# Patient Record
Sex: Female | Born: 1935
Health system: Southern US, Community
[De-identification: ages and names within clinical notes are randomized; demographics above are authoritative.]

## PROBLEM LIST (undated history)

## (undated) DIAGNOSIS — K219 Gastro-esophageal reflux disease without esophagitis: Secondary | ICD-10-CM

## (undated) DIAGNOSIS — E785 Hyperlipidemia, unspecified: Secondary | ICD-10-CM

## (undated) DIAGNOSIS — E119 Type 2 diabetes mellitus without complications: Secondary | ICD-10-CM

## (undated) DIAGNOSIS — I509 Heart failure, unspecified: Secondary | ICD-10-CM

## (undated) DIAGNOSIS — I10 Essential (primary) hypertension: Secondary | ICD-10-CM

## (undated) HISTORY — PX: BREAST BIOPSY: SHX20

## (undated) HISTORY — DX: Gastro-esophageal reflux disease without esophagitis: K21.9

## (undated) HISTORY — PX: OTHER SURGICAL HISTORY: SHX169

## (undated) HISTORY — PX: CHOLECYSTECTOMY OPEN: SUR202

## (undated) HISTORY — DX: Type 2 diabetes mellitus without complications: E11.9

## (undated) HISTORY — PX: VAGINAL HYSTERECTOMY: SUR661

## (undated) HISTORY — DX: Heart failure, unspecified: I50.9

## (undated) HISTORY — DX: Essential (primary) hypertension: I10

## (undated) HISTORY — DX: Hyperlipidemia, unspecified: E78.5

---

## 2005-05-21 ENCOUNTER — Ambulatory Visit: Payer: Self-pay | Admitting: Internal Medicine

## 2006-06-17 ENCOUNTER — Ambulatory Visit: Payer: Self-pay | Admitting: Internal Medicine

## 2006-12-14 ENCOUNTER — Ambulatory Visit: Payer: Self-pay | Admitting: Gastroenterology

## 2007-06-21 ENCOUNTER — Ambulatory Visit: Payer: Self-pay | Admitting: Internal Medicine

## 2007-06-24 ENCOUNTER — Ambulatory Visit: Payer: Self-pay | Admitting: Internal Medicine

## 2008-01-26 ENCOUNTER — Ambulatory Visit: Payer: Self-pay | Admitting: Gynecology

## 2008-03-20 ENCOUNTER — Ambulatory Visit: Payer: Self-pay | Admitting: Gynecology

## 2008-03-20 ENCOUNTER — Ambulatory Visit (HOSPITAL_COMMUNITY): Admission: RE | Admit: 2008-03-20 | Discharge: 2008-03-20 | Payer: Self-pay | Admitting: Gynecology

## 2008-03-22 ENCOUNTER — Ambulatory Visit: Payer: Self-pay | Admitting: Gynecology

## 2008-04-26 ENCOUNTER — Ambulatory Visit: Payer: Self-pay | Admitting: Obstetrics & Gynecology

## 2008-06-22 ENCOUNTER — Ambulatory Visit: Payer: Self-pay | Admitting: Internal Medicine

## 2008-07-11 ENCOUNTER — Ambulatory Visit: Payer: Self-pay | Admitting: Internal Medicine

## 2008-10-15 ENCOUNTER — Ambulatory Visit: Payer: Self-pay | Admitting: Family Medicine

## 2008-10-15 LAB — HM DEXA SCAN

## 2009-02-08 ENCOUNTER — Ambulatory Visit: Payer: Self-pay | Admitting: Urology

## 2009-06-26 ENCOUNTER — Ambulatory Visit: Payer: Self-pay | Admitting: Internal Medicine

## 2010-05-13 ENCOUNTER — Ambulatory Visit: Payer: Self-pay | Admitting: Urology

## 2010-05-28 ENCOUNTER — Ambulatory Visit: Payer: Self-pay | Admitting: Urology

## 2010-06-27 ENCOUNTER — Ambulatory Visit: Payer: Self-pay | Admitting: Internal Medicine

## 2011-01-27 NOTE — Op Note (Signed)
NAME:  Jordan Thornton, Jordan Thornton            ACCOUNT NO.:  [id number]   MEDICAL RECORD NO.:  [id number]          PATIENT TYPE:  AMB   LOCATION:  [place]                           FACILITY:  [facility]   PHYSICIAN:  Willey Blade, MD  DATE OF BIRTH:  10/25/1929   DATE OF PROCEDURE:  03/20/2008  DATE OF DISCHARGE:                               OPERATIVE REPORT   PREOPERATIVE DIAGNOSES:  1. Genuine urinary stress incontinence.  2. Second-degree cystocele.   POSTOPERATIVE DIAGNOSES:  1. Genuine urinary stress incontinence.  2. Second-degree cystocele.   OPERATIVE PROCEDURE:  Tension-free vaginal tape   PROCEDURES:  Cystoscopy and anterior colporrhaphy.   SURGEON:  Willey Blade, MD   ASSISTANT:  None.   COMPLICATIONS:  None immediate.   ESTIMATED BLOOD LOSS:  Less than 100 mL.   SPECIMEN:  None.   FINDINGS:  External genitalia, vulva, and vagina revealed a previous  excised cervix and uterus with a second-degree cystocele and a first-  degree rectocele.   OPERATIVE PROCEDURE:  The patient was prepped and draped in the usual  fashion and placed in a lithotomy position.  Betadine solution was used  for antiseptic and the patient was catheterized prior to the procedure.  After adequate general anesthesia, the anterior vaginal epithelium was  injected with Marcaine and epinephrine.  A vertical incision was made in  the midline for approximately 3-4 cm on the anterior vaginal epithelium.  The pubovesical cervical fascia was then appropriately dissected to the  space of Retzius using a bottom-up technique with a Delphi.  Said trocars were placed, hugging the posterior aspect of the  pubic bone and exiting the skin 1-2 cm lateral to the symphysis pubis.  Following this, immediate cystoscopy was performed.  No injury to the  urethra, trigone, lateral walls, or dome and observed, both ureteral  orifices were identified.  Afterwards, the anterior colporrhaphy was  performed by  using 0 Vicryl to approximate the pubovesical cervical  fascia in the midline.  Care was taken not to produce over-tightening in  this aspect of the procedure.  Afterwards, appropriate tensioning of the  TVT tape was performed and the plastic sheaths were then removed.  The  tape was cut below the skin.  Dermabond  was used for the skin for closure.  Copious irrigation with lactated  Ringer's followed.  Closure of the vaginal epithelium with 0 Monocryl  running interlocking suture.  The patient tolerated the procedure well  and returned to postanesthesia recovery room in excellent condition.      Willey Blade, MD  Electronically Signed     SHB/MEDQ  D:  03/20/2008  T:  03/21/2008  Job:  [id number]

## 2011-01-27 NOTE — Assessment & Plan Note (Signed)
NAME:  Jordan Thornton, Jordan Thornton            ACCOUNT NO.:  [id number]   MEDICAL RECORD NO.:  [id number]          PATIENT TYPE:  POB   LOCATION:  [place] [facility] [place]:  Willey Blade, MD DATE OF BIRTH:  [birth year]   DATE OF SERVICE:  01/26/2008                                  CLINIC NOTE   [name] is a 77 year old African American female gravida 2, para 2-  0-0-2 status post total vaginal hysterectomy and bilateral salpingo-  oophorectomy who has complaints related to genuine urinary stress  incontinence.  The patient loses urine with coughing, straining and  other Valsalva maneuver.  She denies loss of urine at rest.  Denies  postvoid dribbling, fecal incontinence, nocturia or increased frequency.  The patient takes no medications to enhance her loss of urine.  She has  had no previous urological surgery.   OB/GYN HISTORY:  1. The patient has had two normal vaginal deliveries in the past.  2. Total vaginal hysterectomy.  3. Bilateral salpingo-oophorectomy.   MEDICAL HISTORY:  1. The patient has type 2 diabetes mellitus.  2. Hypertension.  3. The patient states her the 2-hour postprandial blood sugars run      between 90-110.  4. She also has a hypercholesterolemia.   PAST SURGICAL HISTORY:  1. A total vaginal hysterectomy.  2. Bilateral salpingo-oophorectomy.  3. Cholecystectomy.  4. Colonoscopy in 2005.   MEDICATIONS:  1. Lisinopril one daily.  The patient does not know dosing, but will      bring it with her to the preop unit.  2. Glipizide dose unknown one in the morning.  3. Metformin 500 mg twice a day.  4. Niaspan one daily.  5. Simvastatin dose unknown one daily.   ALLERGIES:  NONE TO LATEX, IODINE OR FOODS OR MEDICATIONS.   REVIEW OF SYSTEMS:  The 14-point comprehensive review of systems within  normal limits.   SOCIAL HISTORY:  The patient denies alcohol or illicit drug abuse or  tobacco use.   FAMILY HISTORY:  No  first-degree relatives with breast, colon, ovarian  or uterine carcinoma.   PHYSICAL EXAMINATION:  Blood pressure 172/79, weight 172 pounds, height  5 feet,  5-1/2 inches, pulse 66 and regular.  HEENT:  Grossly normal.  BREAST EXAM:  Without masses, discharge, thickenings or tenderness.  CHEST:  Clear to percussion and auscultation.  CARDIOVASCULAR EXAM:  Without murmurs or enlargements, regular rate and  rhythm.  EXTREMITIES, LYMPHATIC, SKIN, NEUROLOGICAL, MUSCULOSKELETAL SYSTEMS:  Within normal limits.  PELVIC EXAM:  External genitalia:  Vulva and vagina reveals a second-  degree cystocele, first-degree rectocele with good vault support.  No  perianal lesions noted.  Cervix absent.  Both adnexa palpable and found  to be normal.  Cuff feels normal with no intrapelvic lesions noted.   IMPRESSION:  Second-degree cystocele and first-degree rectocele with  genuine urinary stress incontinence.   Urinalysis is negative.  Positive cough test and residual urine volume  26 mL.   PLAN:  The patient does not have to splint to have a bowel movement and  has an asymptomatic rectocele.  She is symptomatic for genuine urinary  stress incontinence without history of interstitial  cystitis or an  overactive bladder.  The patient would be an excellent candidate for an  anterior colporrhaphy and a tension-free vaginal tape procedure with  cystoscopy.  She has tried Kegel's on her on without benefit and  declines the use of a pessary.  She is an active woman and wishes to  have definitive management.  The nature of said procedure discussed in  detail including possible postoperative urinary retention, recurrent  incontinence, urgency, graft rejection, infection or erosion,  postoperative hematoma, bleeding and anesthesia pulmonary complications  as well.  The nature of said procedure discussed in detail.  The patient  verbalizes understanding of same and will be scheduled in the near  future for  tension-free vaginal tape procedure, cystoscopy and an  anterior colporrhaphy.           ______________________________  Willey Blade, MD     SHB/MEDQ  D:  01/26/2008  T:  01/26/2008  Job:  [id number]

## 2011-01-27 NOTE — Assessment & Plan Note (Signed)
NAME:  Jordan Thornton, Jordan Thornton            ACCOUNT NO.:  [id number]   MEDICAL RECORD NO.:  [id number]          PATIENT TYPE:  POB   LOCATION:  [place] [facility] [place]:  Silas Sacramento, MD      DATE OF BIRTH:  [birth year]   DATE OF SERVICE:  04/26/2008                                  CLINIC NOTE   The patient is a 77 year old female who presents for continued urinary  incontinence after a transvaginal tape and an anterior colporrhaphy done  on March 20, 2008.  The patient seems to have had an uncomplicated  procedure but, unfortunately, she is still complaining of incontinence  with coughing, laughing, and then now with just walking.  The patient  does not have any incontinence with rest.   PHYSICAL EXAMINATION:  VITAL SIGNS:  Pulse 67, blood pressure 132/83,  weight 172, and height 5 feet 5-1/2 inches.  GENERAL:  Well nourished, well developed, in no apparent distress.  GENITALIA:  Tanner V.  Vagina atrophic; vaginal apex intact; anterior  vaginal wall, no evidence of suture present; still has a mild cystocele;  on mild Valsalva, copious amounts of urine does come out from the  urethra.  There is no evidence of fistula based on this exam.   ASSESSMENT AND PLAN:  A 77 year old female with continued urinary  incontinence.  We will refer the patient to Dr. Bjorn Loser at  [facility] to evaluate whether this is stress incontinence or a  sphincter deficiency.  Hopefully, he can help the woman surgically.           ______________________________  Silas Sacramento, MD     KL/MEDQ  D:  04/26/2008  T:  04/27/2008  Job:  [id number]

## 2011-06-11 LAB — CBC
HCT: 40.9
Hemoglobin: 13.9
RBC: 5.05
RDW: 14.1
WBC: 9.6

## 2011-06-11 LAB — BASIC METABOLIC PANEL
BUN: 16
CO2: 32
Chloride: 104
Glucose, Bld: 73
Potassium: 3.7
Sodium: 143

## 2011-07-06 ENCOUNTER — Ambulatory Visit: Payer: Self-pay | Admitting: Urology

## 2011-07-15 ENCOUNTER — Ambulatory Visit: Payer: Self-pay | Admitting: Urology

## 2011-07-21 ENCOUNTER — Ambulatory Visit: Payer: Self-pay | Admitting: Internal Medicine

## 2012-07-25 ENCOUNTER — Ambulatory Visit: Payer: Self-pay | Admitting: Internal Medicine

## 2013-07-26 ENCOUNTER — Ambulatory Visit: Payer: Self-pay | Admitting: Internal Medicine

## 2014-07-27 ENCOUNTER — Ambulatory Visit: Payer: Self-pay | Admitting: Internal Medicine

## 2014-09-15 LAB — LIPID PANEL
CHOLESTEROL: 175 mg/dL (ref 0–200)
HDL: 59 mg/dL (ref 35–70)
LDL Cholesterol: 89 mg/dL
LDl/HDL Ratio: 1.5
Triglycerides: 133 mg/dL (ref 40–160)

## 2014-09-15 LAB — BASIC METABOLIC PANEL
BUN: 20 mg/dL (ref 4–21)
Creatinine: 1.4 mg/dL — AB (ref <=1.1)
GLUCOSE: 122 mg/dL
Potassium: 4 mmol/L (ref 3.4–5.3)
Sodium: 141 mmol/L (ref 137–147)

## 2014-11-20 LAB — HEMOGLOBIN A1C: Hgb A1c MFr Bld: 7 % — AB (ref 4.0–6.0)

## 2014-12-15 DIAGNOSIS — K219 Gastro-esophageal reflux disease without esophagitis: Secondary | ICD-10-CM

## 2014-12-15 DIAGNOSIS — I10 Essential (primary) hypertension: Secondary | ICD-10-CM

## 2014-12-15 DIAGNOSIS — E785 Hyperlipidemia, unspecified: Secondary | ICD-10-CM | POA: Insufficient documentation

## 2014-12-15 DIAGNOSIS — E119 Type 2 diabetes mellitus without complications: Secondary | ICD-10-CM | POA: Insufficient documentation

## 2015-01-03 DIAGNOSIS — E7849 Other hyperlipidemia: Secondary | ICD-10-CM | POA: Insufficient documentation

## 2015-01-03 DIAGNOSIS — R7989 Other specified abnormal findings of blood chemistry: Secondary | ICD-10-CM | POA: Insufficient documentation

## 2015-01-03 DIAGNOSIS — E119 Type 2 diabetes mellitus without complications: Secondary | ICD-10-CM | POA: Insufficient documentation

## 2015-02-19 ENCOUNTER — Ambulatory Visit: Payer: Self-pay | Admitting: Family Medicine

## 2015-02-19 ENCOUNTER — Ambulatory Visit (INDEPENDENT_AMBULATORY_CARE_PROVIDER_SITE_OTHER): Payer: PPO | Admitting: Family Medicine

## 2015-02-19 ENCOUNTER — Encounter: Payer: Self-pay | Admitting: Family Medicine

## 2015-02-19 VITALS — BP 130/80 | HR 70 | Ht 65.0 in | Wt 154.0 lb

## 2015-02-19 DIAGNOSIS — E119 Type 2 diabetes mellitus without complications: Secondary | ICD-10-CM

## 2015-02-19 DIAGNOSIS — I10 Essential (primary) hypertension: Secondary | ICD-10-CM | POA: Diagnosis not present

## 2015-02-19 DIAGNOSIS — K219 Gastro-esophageal reflux disease without esophagitis: Secondary | ICD-10-CM | POA: Diagnosis not present

## 2015-02-19 DIAGNOSIS — E785 Hyperlipidemia, unspecified: Secondary | ICD-10-CM | POA: Diagnosis not present

## 2015-02-19 MED ORDER — METFORMIN HCL 500 MG PO TABS: 500.0000 mg | ORAL_TABLET | Freq: Two times a day (BID) | ORAL | Status: DC

## 2015-02-19 MED ORDER — LANSOPRAZOLE 15 MG PO CPDR: 15.0000 mg | DELAYED_RELEASE_CAPSULE | ORAL | Status: DC

## 2015-02-19 MED ORDER — SIMVASTATIN 20 MG PO TABS: 20.0000 mg | ORAL_TABLET | Freq: Every day | ORAL | Status: DC

## 2015-02-19 MED ORDER — LISINOPRIL-HYDROCHLOROTHIAZIDE 10-12.5 MG PO TABS: 1.0000 | ORAL_TABLET | ORAL | Status: DC

## 2015-02-19 MED ORDER — SITAGLIPTIN PHOSPHATE 100 MG PO TABS: 100.0000 mg | ORAL_TABLET | Freq: Every day | ORAL | Status: DC

## 2015-02-19 NOTE — Progress Notes (Signed)
Name: Jordan Thornton   MRN: [id number]    DOB: [birth year]   Date:02/19/2015       Progress Note  Subjective  Chief Complaint  Chief Complaint  Patient presents with  . Diabetes  . Hypertension  . Hyperlipidemia  . Gastrophageal Reflux    Diabetes She presents for her follow-up diabetic visit. She has type 2 diabetes mellitus. Her disease course has been stable. There are no hypoglycemic associated symptoms. Pertinent negatives for hypoglycemia include no confusion, dizziness, headaches or nervousness/anxiousness. There are no diabetic associated symptoms. Pertinent negatives for diabetes include no blurred vision, no chest pain, no fatigue, no foot paresthesias, no foot ulcerations, no polydipsia, no polyphagia, no polyuria, no visual change and no weight loss. There are no hypoglycemic complications. Symptoms are stable. There are no diabetic complications. Pertinent negatives for diabetic complications include no CVA or PVD. Risk factors for coronary artery disease include diabetes mellitus and hypertension. Current diabetic treatment includes diet and oral agent (dual therapy). She is compliant with treatment all of the time. Her weight is stable. She is following a generally healthy diet. An ACE inhibitor/angiotensin II receptor blocker is being taken. She does not see a podiatrist.Eye exam is current.  Hypertension This is a chronic problem. The current episode started more than 1 year ago. The problem is unchanged. The problem is controlled. Pertinent negatives include no anxiety, blurred vision, chest pain, headaches, malaise/fatigue, neck pain, palpitations, PND or shortness of breath. There are no associated agents to hypertension. Past treatments include ACE inhibitors and diuretics. There is no history of angina, CAD/MI, CVA, heart failure or PVD.  Hyperlipidemia This is a chronic problem. The current episode started more than 1 year ago. The problem is controlled. There are no  known factors aggravating her hyperlipidemia. Pertinent negatives include no chest pain, focal weakness, myalgias or shortness of breath. The current treatment provides mild improvement of lipids. There are no compliance problems.   Gastrophageal Reflux She reports no abdominal pain, no chest pain, no coughing, no dysphagia, no heartburn, no nausea, no sore throat or no wheezing. This is a chronic problem. The current episode started more than 1 year ago. The problem occurs frequently. The problem has been gradually improving. Pertinent negatives include no fatigue, melena or weight loss. There are no known risk factors. She has tried a histamine-2 antagonist for the symptoms.    No problem-specific assessment & plan notes found for this encounter.   Past Medical History  Diagnosis Date  . Diabetes mellitus without complication   . GERD (gastroesophageal reflux disease)   . Hyperlipidemia   . Hypertension     Past Surgical History  Procedure Laterality Date  . Bladder tack    . Vaginal hysterectomy    . Cholecystectomy open      Family History  Problem Relation Age of Onset  . Heart disease Maternal Aunt   . Heart disease Maternal Grandmother     History   Social History  . Marital Status: Married    Spouse Name: N/A  . Number of Children: N/A  . Years of Education: N/A   Occupational History  . Not on file.   Social History Main Topics  . Smoking status: Former Research scientist (life sciences)  . Smokeless tobacco: Not on file  . Alcohol Use: No  . Drug Use: No  . Sexual Activity: Not on file   Other Topics Concern  . Not on file   Social History Narrative    No Known Allergies  Review of Systems  Constitutional: Negative for fever, chills, weight loss, malaise/fatigue and fatigue.  HENT: Negative for ear discharge, ear pain and sore throat.   Eyes: Negative for blurred vision.  Respiratory: Negative for cough, sputum production, shortness of breath and wheezing.    Cardiovascular: Negative for chest pain, palpitations, leg swelling and PND.  Gastrointestinal: Negative for heartburn, dysphagia, nausea, abdominal pain, diarrhea, constipation, blood in stool and melena.  Genitourinary: Negative for dysuria, urgency, frequency and hematuria.  Musculoskeletal: Negative for myalgias, back pain, joint pain and neck pain.  Skin: Negative for rash.  Neurological: Negative for dizziness, tingling, sensory change, focal weakness and headaches.  Endo/Heme/Allergies: Negative for environmental allergies, polydipsia and polyphagia. Does not bruise/bleed easily.  Psychiatric/Behavioral: Negative for depression, suicidal ideas and confusion. The patient is not nervous/anxious and does not have insomnia.      Objective  Filed Vitals:   02/19/15 1405  BP: 130/80  Pulse: 70  Height: 5\' 5"  (1.651 m)  Weight: 154 lb (69.854 kg)    Physical Exam    No results found for this or any previous visit (from the past 2160 hour(s)).   Assessment & Plan  Problem List Items Addressed This Visit      Cardiovascular and Mediastinum   Hypertension   Relevant Medications   lisinopril-hydrochlorothiazide (PRINZIDE,ZESTORETIC) 10-12.5 MG per tablet   simvastatin (ZOCOR) 20 MG tablet   Other Relevant Orders   Renal Function Panel     Digestive   Gastro-esophageal reflux disease without esophagitis   Relevant Medications   lansoprazole (PREVACID) 15 MG capsule     Endocrine   Diabetes mellitus with no complication - Primary   Relevant Medications   lisinopril-hydrochlorothiazide (PRINZIDE,ZESTORETIC) 10-12.5 MG per tablet   metFORMIN (GLUCOPHAGE) 500 MG tablet   simvastatin (ZOCOR) 20 MG tablet   sitaGLIPtin (JANUVIA) 100 MG tablet   Other Relevant Orders   HgB A1c     Other   Hyperlipidemia   Relevant Medications   lisinopril-hydrochlorothiazide (PRINZIDE,ZESTORETIC) 10-12.5 MG per tablet   simvastatin (ZOCOR) 20 MG tablet        Dr. Deanna  Jones [facility]  02/19/2015

## 2015-02-20 LAB — RENAL FUNCTION PANEL
Albumin: 4.4 g/dL (ref 3.5–4.7)
BUN / CREAT RATIO: 14 (ref 11–26)
BUN: 20 mg/dL (ref 8–27)
CO2: 26 mmol/L (ref 18–29)
Calcium: 9.3 mg/dL (ref 8.7–10.3)
Chloride: 103 mmol/L (ref 97–108)
Creatinine, Ser: 1.41 mg/dL — ABNORMAL HIGH (ref 0.57–1.00)
GFR calc Af Amer: 40 mL/min/{1.73_m2} — ABNORMAL LOW (ref >59)
GFR calc non Af Amer: 34 mL/min/{1.73_m2} — ABNORMAL LOW (ref >59)
GLUCOSE: 73 mg/dL (ref 65–99)
PHOSPHORUS: 4.1 mg/dL (ref 2.5–4.5)
Potassium: 4.3 mmol/L (ref 3.5–5.2)
SODIUM: 145 mmol/L — AB (ref 134–144)

## 2015-02-20 LAB — HEMOGLOBIN A1C
ESTIMATED AVERAGE GLUCOSE: 148 mg/dL
HEMOGLOBIN A1C: 6.8 % — AB (ref 4.8–5.6)

## 2015-03-22 ENCOUNTER — Other Ambulatory Visit: Payer: Self-pay

## 2015-03-22 DIAGNOSIS — I10 Essential (primary) hypertension: Secondary | ICD-10-CM

## 2015-03-22 MED ORDER — LISINOPRIL-HYDROCHLOROTHIAZIDE 10-12.5 MG PO TABS
1.0000 | ORAL_TABLET | ORAL | Status: DC
Start: 2015-03-22 — End: 2015-03-25

## 2015-03-25 ENCOUNTER — Other Ambulatory Visit: Payer: Self-pay | Admitting: Family Medicine

## 2015-03-25 DIAGNOSIS — I10 Essential (primary) hypertension: Secondary | ICD-10-CM

## 2015-04-24 ENCOUNTER — Other Ambulatory Visit: Payer: Self-pay | Admitting: Family Medicine

## 2015-04-24 DIAGNOSIS — E119 Type 2 diabetes mellitus without complications: Secondary | ICD-10-CM

## 2015-05-14 ENCOUNTER — Other Ambulatory Visit: Payer: Self-pay | Admitting: Family Medicine

## 2015-05-14 DIAGNOSIS — E119 Type 2 diabetes mellitus without complications: Secondary | ICD-10-CM

## 2015-05-16 HISTORY — PX: CATARACT EXTRACTION: SUR2

## 2015-05-27 ENCOUNTER — Other Ambulatory Visit: Payer: Self-pay

## 2015-06-18 ENCOUNTER — Other Ambulatory Visit: Payer: Self-pay | Admitting: Family Medicine

## 2015-06-18 DIAGNOSIS — E119 Type 2 diabetes mellitus without complications: Secondary | ICD-10-CM

## 2015-06-18 DIAGNOSIS — Z794 Long term (current) use of insulin: Principal | ICD-10-CM

## 2015-06-18 DIAGNOSIS — I10 Essential (primary) hypertension: Secondary | ICD-10-CM

## 2015-07-22 ENCOUNTER — Other Ambulatory Visit: Payer: Self-pay | Admitting: Family Medicine

## 2015-07-22 DIAGNOSIS — E119 Type 2 diabetes mellitus without complications: Secondary | ICD-10-CM

## 2015-07-28 ENCOUNTER — Other Ambulatory Visit: Payer: Self-pay | Admitting: Family Medicine

## 2015-07-31 ENCOUNTER — Other Ambulatory Visit: Payer: Self-pay | Admitting: Internal Medicine

## 2015-07-31 DIAGNOSIS — Z1231 Encounter for screening mammogram for malignant neoplasm of breast: Secondary | ICD-10-CM

## 2015-08-07 ENCOUNTER — Ambulatory Visit
Admission: RE | Admit: 2015-08-07 | Discharge: 2015-08-07 | Disposition: A | Discharge status: Home / Self Care | Payer: PPO | Source: Ambulatory Visit | Attending: Internal Medicine | Admitting: Internal Medicine

## 2015-08-07 DIAGNOSIS — Z1231 Encounter for screening mammogram for malignant neoplasm of breast: Secondary | ICD-10-CM | POA: Insufficient documentation

## 2015-08-21 ENCOUNTER — Ambulatory Visit (INDEPENDENT_AMBULATORY_CARE_PROVIDER_SITE_OTHER): Payer: PPO | Admitting: Family Medicine

## 2015-08-21 ENCOUNTER — Encounter: Payer: Self-pay | Admitting: Family Medicine

## 2015-08-21 VITALS — BP 124/80 | HR 72 | Ht 65.0 in | Wt 154.0 lb

## 2015-08-21 DIAGNOSIS — E119 Type 2 diabetes mellitus without complications: Secondary | ICD-10-CM | POA: Diagnosis not present

## 2015-08-21 DIAGNOSIS — E785 Hyperlipidemia, unspecified: Secondary | ICD-10-CM

## 2015-08-21 DIAGNOSIS — K219 Gastro-esophageal reflux disease without esophagitis: Secondary | ICD-10-CM | POA: Diagnosis not present

## 2015-08-21 DIAGNOSIS — I10 Essential (primary) hypertension: Secondary | ICD-10-CM | POA: Diagnosis not present

## 2015-08-21 MED ORDER — SIMVASTATIN 20 MG PO TABS: 20.0000 mg | ORAL_TABLET | Freq: Every day | ORAL | Status: DC

## 2015-08-21 MED ORDER — METFORMIN HCL 500 MG PO TABS: 500.0000 mg | ORAL_TABLET | Freq: Two times a day (BID) | ORAL | Status: DC

## 2015-08-21 MED ORDER — LANSOPRAZOLE 15 MG PO CPDR: 15.0000 mg | DELAYED_RELEASE_CAPSULE | ORAL | Status: DC

## 2015-08-21 MED ORDER — LISINOPRIL-HYDROCHLOROTHIAZIDE 10-12.5 MG PO TABS: 1.0000 | ORAL_TABLET | Freq: Every day | ORAL | Status: DC

## 2015-08-21 NOTE — Progress Notes (Signed)
Name: Jordan Thornton   MRN: [id number]    DOB: [birth year]   Date:08/21/2015       Progress Note  Subjective  Chief Complaint  Chief Complaint  Patient presents with  . Gastroesophageal Reflux  . Hyperlipidemia  . Hypertension  . Diabetes    Gastroesophageal Reflux She reports no abdominal pain, no belching, no chest pain, no choking, no coughing, no dysphagia, no early satiety, no globus sensation, no heartburn, no hoarse voice, no nausea, no sore throat, no stridor, no tooth decay, no water brash or no wheezing. This is a chronic problem. The current episode started more than 1 year ago. The problem has been gradually improving. The symptoms are aggravated by certain foods. Pertinent negatives include no anemia, fatigue, melena, muscle weakness, orthopnea or weight loss. There are no known risk factors. She has tried a histamine-2 antagonist for the symptoms. The treatment provided moderate relief.  Hyperlipidemia This is a chronic problem. The current episode started more than 1 year ago. Recent lipid tests were reviewed and are normal. She has no history of chronic renal disease, diabetes, hypothyroidism, liver disease, obesity or nephrotic syndrome. Pertinent negatives include no chest pain, focal weakness, leg pain, myalgias or shortness of breath. Current antihyperlipidemic treatment includes statins. The current treatment provides moderate improvement of lipids. There are no compliance problems.  Risk factors for coronary artery disease include dyslipidemia, diabetes mellitus and post-menopausal.  Hypertension This is a recurrent problem. The current episode started more than 1 year ago. The problem has been gradually improving since onset. The problem is controlled. Pertinent negatives include no anxiety, blurred vision, chest pain, headaches, malaise/fatigue, neck pain, orthopnea, palpitations, peripheral edema, PND, shortness of breath or sweats. There are no associated agents to  hypertension. Risk factors for coronary artery disease include dyslipidemia and diabetes mellitus. Past treatments include diuretics and ACE inhibitors. The current treatment provides moderate improvement. There is no history of angina, kidney disease, CAD/MI, CVA, heart failure, left ventricular hypertrophy, PVD, renovascular disease or retinopathy. There is no history of chronic renal disease or a hypertension causing med.  Diabetes She presents for her follow-up diabetic visit. She has type 2 diabetes mellitus. No MedicAlert identification noted. Pertinent negatives for hypoglycemia include no confusion, dizziness, headaches, hunger, mood changes, nervousness/anxiousness, pallor, seizures, sleepiness, speech difficulty, sweats or tremors. Pertinent negatives for diabetes include no blurred vision, no chest pain, no fatigue, no foot paresthesias, no foot ulcerations, no polydipsia, no polyphagia, no polyuria, no visual change, no weakness and no weight loss. There are no hypoglycemic complications. Symptoms are stable. Pertinent negatives for diabetic complications include no CVA, heart disease, nephropathy, peripheral neuropathy, PVD or retinopathy. Risk factors for coronary artery disease include diabetes mellitus, dyslipidemia and hypertension. Current diabetic treatment includes oral agent (dual therapy). Her weight is stable. She is following a generally healthy diet. When asked about meal planning, she reported none. She has not had a previous visit with a dietitian. She participates in exercise weekly. Her home blood glucose trend is fluctuating minimally. Her breakfast blood glucose is taken between 8-9 am. Her breakfast blood glucose range is generally 90-110 mg/dl. Eye exam is current.    No problem-specific assessment & plan notes found for this encounter.   Past Medical History  Diagnosis Date  . Diabetes mellitus without complication ([place])   . GERD (gastroesophageal reflux disease)   .  Hyperlipidemia   . Hypertension     Past Surgical History  Procedure Laterality Date  . Bladder tack    .  Vaginal hysterectomy    . Cholecystectomy open    . Breast biopsy Left 10-15 yrs ago    benign  . Cataract extraction Bilateral 05/2015    Family History  Problem Relation Age of Onset  . Heart disease Maternal Aunt   . Heart disease Maternal Grandmother     Social History   Social History  . Marital Status: Married    Spouse Name: N/A  . Number of Children: N/A  . Years of Education: N/A   Occupational History  . Not on file.   Social History Main Topics  . Smoking status: Former Research scientist (life sciences)  . Smokeless tobacco: Not on file  . Alcohol Use: No  . Drug Use: No  . Sexual Activity: Not Currently   Other Topics Concern  . Not on file   Social History Narrative    No Known Allergies   Review of Systems  Constitutional: Negative for fever, chills, weight loss, malaise/fatigue and fatigue.  HENT: Negative for ear discharge, ear pain, hoarse voice and sore throat.   Eyes: Negative for blurred vision.  Respiratory: Negative for cough, sputum production, choking, shortness of breath and wheezing.   Cardiovascular: Negative for chest pain, palpitations, orthopnea, leg swelling and PND.  Gastrointestinal: Negative for heartburn, dysphagia, nausea, abdominal pain, diarrhea, constipation, blood in stool and melena.  Genitourinary: Negative for dysuria, urgency, frequency and hematuria.  Musculoskeletal: Negative for myalgias, back pain, joint pain, muscle weakness and neck pain.  Skin: Negative for pallor and rash.  Neurological: Negative for dizziness, tingling, tremors, sensory change, focal weakness, seizures, speech difficulty, weakness and headaches.  Endo/Heme/Allergies: Negative for environmental allergies, polydipsia and polyphagia. Does not bruise/bleed easily.  Psychiatric/Behavioral: Negative for depression, suicidal ideas and confusion. The patient is not  nervous/anxious and does not have insomnia.      Objective  Filed Vitals:   08/21/15 0917  BP: 124/80  Pulse: 72  Height: 5\' 5"  (1.651 m)  Weight: 154 lb (69.854 kg)    Physical Exam  Constitutional: She is well-developed, well-nourished, and in no distress. No distress.  HENT:  Head: Normocephalic and atraumatic.  Right Ear: External ear normal.  Left Ear: External ear normal.  Nose: Nose normal.  Mouth/Throat: Oropharynx is clear and moist.  Eyes: Conjunctivae and EOM are normal. Pupils are equal, round, and reactive to light. Right eye exhibits no discharge. Left eye exhibits no discharge.  Neck: Normal range of motion. Neck supple. No JVD present. No thyromegaly present.  Cardiovascular: Normal rate, regular rhythm, normal heart sounds and intact distal pulses.  Exam reveals no gallop and no friction rub.   No murmur heard. Pulmonary/Chest: Effort normal and breath sounds normal.  Abdominal: Soft. Bowel sounds are normal. She exhibits no mass. There is no tenderness. There is no guarding.  Musculoskeletal: Normal range of motion. She exhibits no edema.  Lymphadenopathy:    She has no cervical adenopathy.  Neurological: She is alert. She has normal reflexes.  Skin: Skin is warm and dry. She is not diaphoretic.  Psychiatric: Mood and affect normal.      Assessment & Plan  Problem List Items Addressed This Visit      Cardiovascular and Mediastinum   Hypertension   Relevant Medications   lisinopril-hydrochlorothiazide (PRINZIDE,ZESTORETIC) 10-12.5 MG tablet   simvastatin (ZOCOR) 20 MG tablet   Other Relevant Orders   Renal Function Panel     Digestive   Gastro-esophageal reflux disease without esophagitis   Relevant Medications   lansoprazole (PREVACID) 15 MG  capsule     Endocrine   Diabetes ([place]) - Primary   Relevant Medications   metFORMIN (GLUCOPHAGE) 500 MG tablet   lisinopril-hydrochlorothiazide (PRINZIDE,ZESTORETIC) 10-12.5 MG tablet   simvastatin  (ZOCOR) 20 MG tablet   Other Relevant Orders   Renal Function Panel   HgB A1c   Urine Microalbumin w/creat. ratio   Diabetes mellitus with no complication (H[facility])   Relevant Medications   metFORMIN (GLUCOPHAGE) 500 MG tablet   lisinopril-hydrochlorothiazide (PRINZIDE,ZESTORETIC) 10-12.5 MG tablet   simvastatin (ZOCOR) 20 MG tablet     Other   Hyperlipidemia   Relevant Medications   lisinopril-hydrochlorothiazide (PRINZIDE,ZESTORETIC) 10-12.5 MG tablet   simvastatin (ZOCOR) 20 MG tablet   Other Relevant Orders   Lipid Profile        Dr. Otilio Miu [facility]  08/21/2015

## 2015-08-22 LAB — RENAL FUNCTION PANEL
ALBUMIN: 4.1 g/dL (ref 3.5–4.7)
BUN / CREAT RATIO: 14 (ref 11–26)
BUN: 20 mg/dL (ref 8–27)
CO2: 26 mmol/L (ref 18–29)
Calcium: 9.8 mg/dL (ref 8.7–10.3)
Chloride: 101 mmol/L (ref 97–106)
Creatinine, Ser: 1.46 mg/dL — ABNORMAL HIGH (ref 0.57–1.00)
GFR, EST AFRICAN AMERICAN: 38 mL/min/{1.73_m2} — AB (ref >59)
GFR, EST NON AFRICAN AMERICAN: 33 mL/min/{1.73_m2} — AB (ref >59)
GLUCOSE: 103 mg/dL — AB (ref 65–99)
POTASSIUM: 5 mmol/L (ref 3.5–5.2)
Phosphorus: 3.7 mg/dL (ref 2.5–4.5)
Sodium: 143 mmol/L (ref 136–144)

## 2015-08-22 LAB — LIPID PANEL
CHOL/HDL RATIO: 2.8 ratio (ref 0.0–4.4)
CHOLESTEROL TOTAL: 175 mg/dL (ref 100–199)
HDL: 63 mg/dL (ref >39)
LDL CALC: 87 mg/dL (ref 0–99)
TRIGLYCERIDES: 124 mg/dL (ref 0–149)
VLDL CHOLESTEROL CAL: 25 mg/dL (ref 5–40)

## 2015-08-22 LAB — MICROALBUMIN / CREATININE URINE RATIO
Creatinine, Urine: 101.8 mg/dL
MICROALB/CREAT RATIO: 4.2 mg/g creat (ref 0.0–30.0)
MICROALBUM., U, RANDOM: 4.3 ug/mL

## 2015-08-22 LAB — HEMOGLOBIN A1C
Est. average glucose Bld gHb Est-mCnc: 157 mg/dL
Hgb A1c MFr Bld: 7.1 % — ABNORMAL HIGH (ref 4.8–5.6)

## 2015-10-03 ENCOUNTER — Other Ambulatory Visit: Payer: Self-pay | Admitting: Family Medicine

## 2015-10-20 ENCOUNTER — Other Ambulatory Visit: Payer: Self-pay | Admitting: Family Medicine

## 2015-11-21 DIAGNOSIS — E119 Type 2 diabetes mellitus without complications: Secondary | ICD-10-CM | POA: Diagnosis not present

## 2015-11-21 DIAGNOSIS — I119 Hypertensive heart disease without heart failure: Secondary | ICD-10-CM | POA: Diagnosis not present

## 2015-11-21 DIAGNOSIS — E785 Hyperlipidemia, unspecified: Secondary | ICD-10-CM | POA: Diagnosis not present

## 2015-11-21 DIAGNOSIS — Z0000 Encounter for general adult medical examination without abnormal findings: Secondary | ICD-10-CM | POA: Diagnosis not present

## 2015-11-22 ENCOUNTER — Other Ambulatory Visit: Payer: Self-pay | Admitting: Family Medicine

## 2015-12-30 ENCOUNTER — Other Ambulatory Visit: Payer: Self-pay | Admitting: Family Medicine

## 2016-01-29 ENCOUNTER — Other Ambulatory Visit: Payer: Self-pay | Admitting: Family Medicine

## 2016-02-19 ENCOUNTER — Encounter: Payer: Self-pay | Admitting: Family Medicine

## 2016-02-19 ENCOUNTER — Ambulatory Visit (INDEPENDENT_AMBULATORY_CARE_PROVIDER_SITE_OTHER): Payer: PPO | Admitting: Family Medicine

## 2016-02-19 VITALS — BP 120/78 | HR 78 | Ht 65.0 in | Wt 154.0 lb

## 2016-02-19 DIAGNOSIS — E119 Type 2 diabetes mellitus without complications: Secondary | ICD-10-CM | POA: Diagnosis not present

## 2016-02-19 DIAGNOSIS — K219 Gastro-esophageal reflux disease without esophagitis: Secondary | ICD-10-CM

## 2016-02-19 DIAGNOSIS — R7989 Other specified abnormal findings of blood chemistry: Secondary | ICD-10-CM

## 2016-02-19 DIAGNOSIS — R748 Abnormal levels of other serum enzymes: Secondary | ICD-10-CM

## 2016-02-19 DIAGNOSIS — I10 Essential (primary) hypertension: Secondary | ICD-10-CM | POA: Diagnosis not present

## 2016-02-19 DIAGNOSIS — E785 Hyperlipidemia, unspecified: Secondary | ICD-10-CM

## 2016-02-19 MED ORDER — METFORMIN HCL 500 MG PO TABS: 500.0000 mg | ORAL_TABLET | Freq: Two times a day (BID) | ORAL | Status: DC

## 2016-02-19 MED ORDER — SITAGLIPTIN PHOSPHATE 100 MG PO TABS: 100.0000 mg | ORAL_TABLET | Freq: Every day | ORAL | Status: DC

## 2016-02-19 MED ORDER — LANSOPRAZOLE 15 MG PO CPDR: 15.0000 mg | DELAYED_RELEASE_CAPSULE | ORAL | Status: DC

## 2016-02-19 MED ORDER — SIMVASTATIN 20 MG PO TABS: 20.0000 mg | ORAL_TABLET | Freq: Every day | ORAL | Status: DC

## 2016-02-19 MED ORDER — LISINOPRIL-HYDROCHLOROTHIAZIDE 10-12.5 MG PO TABS: 1.0000 | ORAL_TABLET | Freq: Every day | ORAL | Status: DC

## 2016-02-19 NOTE — Progress Notes (Signed)
Name: Jordan Thornton   MRN: [id number]    DOB: [birth year]   Date:02/19/2016       Progress Note  Subjective  Chief Complaint  Chief Complaint  Patient presents with  . Diabetes  . Hypertension  . Hyperlipidemia  . Gastroesophageal Reflux    Diabetes She presents for her follow-up diabetic visit. She has type 2 diabetes mellitus. Her disease course has been stable. There are no hypoglycemic associated symptoms. Pertinent negatives for hypoglycemia include no confusion, dizziness, headaches or nervousness/anxiousness. Pertinent negatives for diabetes include no blurred vision, no chest pain, no fatigue, no foot paresthesias, no foot ulcerations, no polydipsia, no polyphagia, no polyuria, no visual change, no weakness and no weight loss. There are no hypoglycemic complications. Pertinent negatives for hypoglycemia complications include no blackouts and no nocturnal hypoglycemia. Symptoms are stable. There are no diabetic complications. Pertinent negatives for diabetic complications include no CVA, PVD or retinopathy. Current diabetic treatment includes oral agent (dual therapy). She is following a generally healthy diet. Her breakfast blood glucose is taken between 8-9 am. Her breakfast blood glucose range is generally 90-110 mg/dl. An ACE inhibitor/angiotensin II receptor blocker is being taken. She does not see a podiatrist.Eye exam is not current (recent cataract surgery).  Hypertension This is a chronic problem. The problem has been gradually improving since onset. The problem is controlled. Pertinent negatives include no blurred vision, chest pain, headaches, malaise/fatigue, neck pain, palpitations, peripheral edema or shortness of breath. There are no associated agents to hypertension. Risk factors for coronary artery disease include diabetes mellitus and dyslipidemia. Past treatments include ACE inhibitors and diuretics. The current treatment provides moderate improvement. There are no  compliance problems.  There is no history of angina, kidney disease, CAD/MI, CVA, heart failure, left ventricular hypertrophy, PVD, renovascular disease or retinopathy. There is no history of chronic renal disease or a hypertension causing med.  Hyperlipidemia This is a chronic problem. The current episode started more than 1 year ago. The problem is controlled. Recent lipid tests were reviewed and are normal. Exacerbating diseases include diabetes. She has no history of chronic renal disease, liver disease, obesity or nephrotic syndrome. Pertinent negatives include no chest pain, focal weakness, myalgias or shortness of breath. Current antihyperlipidemic treatment includes statins. The current treatment provides moderate improvement of lipids. There are no compliance problems.   Gastroesophageal Reflux She reports no abdominal pain, no belching, no chest pain, no choking, no coughing, no dysphagia, no heartburn, no nausea, no sore throat or no wheezing. This is a chronic problem. The problem occurs frequently. The symptoms are aggravated by certain foods. Pertinent negatives include no fatigue, melena or weight loss. The treatment provided mild relief.    No problem-specific assessment & plan notes found for this encounter.   Past Medical History  Diagnosis Date  . Diabetes mellitus without complication ([place])   . GERD (gastroesophageal reflux disease)   . Hyperlipidemia   . Hypertension     Past Surgical History  Procedure Laterality Date  . Bladder tack    . Vaginal hysterectomy    . Cholecystectomy open    . Breast biopsy Left 10-15 yrs ago    benign  . Cataract extraction Bilateral 05/2015    Family History  Problem Relation Age of Onset  . Heart disease Maternal Aunt   . Heart disease Maternal Grandmother     Social History   Social History  . Marital Status: Married    Spouse Name: N/A  . Number of Children:  N/A  . Years of Education: N/A   Occupational History  . Not  on file.   Social History Main Topics  . Smoking status: Former Research scientist (life sciences)  . Smokeless tobacco: Not on file  . Alcohol Use: No  . Drug Use: No  . Sexual Activity: Not Currently   Other Topics Concern  . Not on file   Social History Narrative    No Known Allergies   Review of Systems  Constitutional: Negative for fever, chills, weight loss, malaise/fatigue and fatigue.  HENT: Negative for ear discharge, ear pain and sore throat.   Eyes: Negative for blurred vision.  Respiratory: Negative for cough, sputum production, choking, shortness of breath and wheezing.   Cardiovascular: Negative for chest pain, palpitations and leg swelling.  Gastrointestinal: Negative for heartburn, dysphagia, nausea, abdominal pain, diarrhea, constipation, blood in stool and melena.  Genitourinary: Negative for dysuria, urgency, frequency and hematuria.  Musculoskeletal: Negative for myalgias, back pain, joint pain and neck pain.  Skin: Negative for rash.  Neurological: Negative for dizziness, tingling, sensory change, focal weakness, weakness and headaches.  Endo/Heme/Allergies: Negative for environmental allergies, polydipsia and polyphagia. Does not bruise/bleed easily.  Psychiatric/Behavioral: Negative for depression, suicidal ideas and confusion. The patient is not nervous/anxious and does not have insomnia.      Objective  Filed Vitals:   02/19/16 0855  BP: 120/78  Pulse: 78  Height: 5\' 5"  (1.651 m)  Weight: 154 lb (69.854 kg)    Physical Exam  Constitutional: She is well-developed, well-nourished, and in no distress. No distress.  HENT:  Head: Normocephalic and atraumatic.  Right Ear: Tympanic membrane, external ear and ear canal normal.  Left Ear: Tympanic membrane, external ear and ear canal normal.  Nose: Nose normal.  Mouth/Throat: Oropharynx is clear and moist.  Eyes: Conjunctivae and EOM are normal. Pupils are equal, round, and reactive to light. Right eye exhibits no discharge.  Left eye exhibits no discharge.  Neck: Normal range of motion. Neck supple. No JVD present. No thyromegaly present.  Cardiovascular: Normal rate, regular rhythm, normal heart sounds and intact distal pulses.  Exam reveals no gallop and no friction rub.   No murmur heard. Pulmonary/Chest: Effort normal and breath sounds normal. She has no wheezes. She has no rales.  Abdominal: Soft. Bowel sounds are normal. She exhibits no mass. There is no tenderness. There is no guarding.  Musculoskeletal: Normal range of motion. She exhibits no edema.  Lymphadenopathy:    She has no cervical adenopathy.  Neurological: She is alert. She has normal reflexes.  Skin: Skin is warm and dry. She is not diaphoretic.  Psychiatric: Mood and affect normal.      Assessment & Plan  Problem List Items Addressed This Visit      Cardiovascular and Mediastinum   Hypertension - Primary   Relevant Medications   lisinopril-hydrochlorothiazide (PRINZIDE,ZESTORETIC) 10-12.5 MG tablet   simvastatin (ZOCOR) 20 MG tablet   Other Relevant Orders   Renal Function Panel     Digestive   Gastro-esophageal reflux disease without esophagitis   Relevant Medications   lansoprazole (PREVACID) 15 MG capsule     Endocrine   Diabetes mellitus with no complication (HCC)   Relevant Medications   metFORMIN (GLUCOPHAGE) 500 MG tablet   lisinopril-hydrochlorothiazide (PRINZIDE,ZESTORETIC) 10-12.5 MG tablet   simvastatin (ZOCOR) 20 MG tablet   sitaGLIPtin (JANUVIA) 100 MG tablet   Other Relevant Orders   Hemoglobin A1c     Other   Hyperlipidemia   Relevant Medications  lisinopril-hydrochlorothiazide (PRINZIDE,ZESTORETIC) 10-12.5 MG tablet   simvastatin (ZOCOR) 20 MG tablet   Other Relevant Orders   Lipid Profile        Dr. Otilio Miu [facility]  02/19/2016

## 2016-02-20 NOTE — Addendum Note (Signed)
Addended by: Fredderick Severance on: 02/20/2016 11:36 AM   Modules accepted: Orders

## 2016-02-21 LAB — RENAL FUNCTION PANEL
ALBUMIN: 4.2 g/dL (ref 3.5–4.7)
BUN/Creatinine Ratio: 13 (ref 12–28)
BUN: 21 mg/dL (ref 8–27)
CALCIUM: 9.5 mg/dL (ref 8.7–10.3)
CHLORIDE: 103 mmol/L (ref 96–106)
CO2: 23 mmol/L (ref 18–29)
Creatinine, Ser: 1.61 mg/dL — ABNORMAL HIGH (ref 0.57–1.00)
GFR calc Af Amer: 34 mL/min/{1.73_m2} — ABNORMAL LOW (ref >59)
GFR, EST NON AFRICAN AMERICAN: 29 mL/min/{1.73_m2} — AB (ref >59)
GLUCOSE: 123 mg/dL — AB (ref 65–99)
PHOSPHORUS: 4.1 mg/dL (ref 2.5–4.5)
POTASSIUM: 4.3 mmol/L (ref 3.5–5.2)
SODIUM: 146 mmol/L — AB (ref 134–144)

## 2016-02-21 LAB — LIPID PANEL
CHOL/HDL RATIO: 2.6 ratio (ref 0.0–4.4)
CHOLESTEROL TOTAL: 185 mg/dL (ref 100–199)
HDL: 71 mg/dL (ref >39)
LDL CALC: 94 mg/dL (ref 0–99)
TRIGLYCERIDES: 99 mg/dL (ref 0–149)
VLDL Cholesterol Cal: 20 mg/dL (ref 5–40)

## 2016-02-21 LAB — HEMOGLOBIN A1C
Est. average glucose Bld gHb Est-mCnc: 151 mg/dL
Hgb A1c MFr Bld: 6.9 % — ABNORMAL HIGH (ref 4.8–5.6)

## 2016-03-10 DIAGNOSIS — N183 Chronic kidney disease, stage 3 (moderate): Secondary | ICD-10-CM | POA: Diagnosis not present

## 2016-03-10 DIAGNOSIS — I10 Essential (primary) hypertension: Secondary | ICD-10-CM | POA: Diagnosis not present

## 2016-03-10 DIAGNOSIS — E119 Type 2 diabetes mellitus without complications: Secondary | ICD-10-CM | POA: Diagnosis not present

## 2016-03-16 ENCOUNTER — Other Ambulatory Visit: Payer: Self-pay | Admitting: Family Medicine

## 2016-03-24 DIAGNOSIS — N183 Chronic kidney disease, stage 3 (moderate): Secondary | ICD-10-CM | POA: Diagnosis not present

## 2016-04-03 DIAGNOSIS — E119 Type 2 diabetes mellitus without complications: Secondary | ICD-10-CM | POA: Diagnosis not present

## 2016-04-03 DIAGNOSIS — N2581 Secondary hyperparathyroidism of renal origin: Secondary | ICD-10-CM | POA: Diagnosis not present

## 2016-04-03 DIAGNOSIS — N183 Chronic kidney disease, stage 3 (moderate): Secondary | ICD-10-CM | POA: Diagnosis not present

## 2016-04-03 DIAGNOSIS — I10 Essential (primary) hypertension: Secondary | ICD-10-CM | POA: Diagnosis not present

## 2016-04-06 DIAGNOSIS — E119 Type 2 diabetes mellitus without complications: Secondary | ICD-10-CM | POA: Diagnosis not present

## 2016-04-06 DIAGNOSIS — I119 Hypertensive heart disease without heart failure: Secondary | ICD-10-CM | POA: Diagnosis not present

## 2016-05-26 DIAGNOSIS — E119 Type 2 diabetes mellitus without complications: Secondary | ICD-10-CM | POA: Diagnosis not present

## 2016-05-26 DIAGNOSIS — I119 Hypertensive heart disease without heart failure: Secondary | ICD-10-CM | POA: Diagnosis not present

## 2016-06-15 DIAGNOSIS — Z7984 Long term (current) use of oral hypoglycemic drugs: Secondary | ICD-10-CM | POA: Diagnosis not present

## 2016-06-15 DIAGNOSIS — Z9841 Cataract extraction status, right eye: Secondary | ICD-10-CM | POA: Diagnosis not present

## 2016-06-15 DIAGNOSIS — H35033 Hypertensive retinopathy, bilateral: Secondary | ICD-10-CM | POA: Diagnosis not present

## 2016-06-15 DIAGNOSIS — I10 Essential (primary) hypertension: Secondary | ICD-10-CM | POA: Diagnosis not present

## 2016-06-15 DIAGNOSIS — Z9842 Cataract extraction status, left eye: Secondary | ICD-10-CM | POA: Diagnosis not present

## 2016-06-15 DIAGNOSIS — E119 Type 2 diabetes mellitus without complications: Secondary | ICD-10-CM | POA: Diagnosis not present

## 2016-06-22 DIAGNOSIS — N3000 Acute cystitis without hematuria: Secondary | ICD-10-CM | POA: Diagnosis not present

## 2016-06-23 ENCOUNTER — Other Ambulatory Visit: Payer: Self-pay | Admitting: Internal Medicine

## 2016-06-23 DIAGNOSIS — Z1231 Encounter for screening mammogram for malignant neoplasm of breast: Secondary | ICD-10-CM

## 2016-07-06 ENCOUNTER — Other Ambulatory Visit: Payer: Self-pay | Admitting: Family Medicine

## 2016-07-27 DIAGNOSIS — N183 Chronic kidney disease, stage 3 (moderate): Secondary | ICD-10-CM | POA: Diagnosis not present

## 2016-07-27 DIAGNOSIS — N2581 Secondary hyperparathyroidism of renal origin: Secondary | ICD-10-CM | POA: Diagnosis not present

## 2016-07-27 DIAGNOSIS — I10 Essential (primary) hypertension: Secondary | ICD-10-CM | POA: Diagnosis not present

## 2016-07-27 DIAGNOSIS — E119 Type 2 diabetes mellitus without complications: Secondary | ICD-10-CM | POA: Diagnosis not present

## 2016-08-10 ENCOUNTER — Ambulatory Visit
Admission: RE | Admit: 2016-08-10 | Discharge: 2016-08-10 | Disposition: A | Discharge status: Home / Self Care | Payer: PPO | Source: Ambulatory Visit | Attending: Internal Medicine | Admitting: Internal Medicine

## 2016-08-10 ENCOUNTER — Encounter: Payer: Self-pay | Admitting: Radiology

## 2016-08-10 DIAGNOSIS — Z1231 Encounter for screening mammogram for malignant neoplasm of breast: Secondary | ICD-10-CM | POA: Diagnosis not present

## 2016-08-20 ENCOUNTER — Ambulatory Visit (INDEPENDENT_AMBULATORY_CARE_PROVIDER_SITE_OTHER): Payer: PPO | Admitting: Family Medicine

## 2016-08-20 VITALS — BP 132/80 | HR 78 | Temp 98.2°F | Ht 65.0 in | Wt 154.0 lb

## 2016-08-20 DIAGNOSIS — E119 Type 2 diabetes mellitus without complications: Secondary | ICD-10-CM | POA: Diagnosis not present

## 2016-08-20 DIAGNOSIS — E782 Mixed hyperlipidemia: Secondary | ICD-10-CM | POA: Diagnosis not present

## 2016-08-20 DIAGNOSIS — K219 Gastro-esophageal reflux disease without esophagitis: Secondary | ICD-10-CM | POA: Diagnosis not present

## 2016-08-20 DIAGNOSIS — I10 Essential (primary) hypertension: Secondary | ICD-10-CM | POA: Diagnosis not present

## 2016-08-20 MED ORDER — METFORMIN HCL 500 MG PO TABS: 500.0000 mg | ORAL_TABLET | Freq: Two times a day (BID) | ORAL | 1 refills | Status: DC

## 2016-08-20 MED ORDER — SIMVASTATIN 20 MG PO TABS: 20.0000 mg | ORAL_TABLET | Freq: Every day | ORAL | 1 refills | Status: DC

## 2016-08-20 MED ORDER — LANSOPRAZOLE 15 MG PO CPDR: 15.0000 mg | DELAYED_RELEASE_CAPSULE | ORAL | 1 refills | Status: DC

## 2016-08-20 MED ORDER — LISINOPRIL-HYDROCHLOROTHIAZIDE 10-12.5 MG PO TABS: 1.0000 | ORAL_TABLET | Freq: Every day | ORAL | 1 refills | Status: DC

## 2016-08-20 MED ORDER — METFORMIN HCL ER 500 MG PO TB24: 500.0000 mg | ORAL_TABLET | Freq: Two times a day (BID) | ORAL | 1 refills | Status: DC

## 2016-08-20 NOTE — Progress Notes (Signed)
Name: Jordan Thornton   MRN: [id number]    DOB: 06-12-1930   Date:08/20/2016       Progress Note  Subjective  Chief Complaint  Chief Complaint  Patient presents with  . Diabetes    Aic 89 yesterday    Diabetes  She presents for her follow-up diabetic visit. She has type 2 diabetes mellitus. Her disease course has been stable. There are no hypoglycemic associated symptoms. Pertinent negatives for hypoglycemia include no dizziness, headaches or nervousness/anxiousness. Pertinent negatives for diabetes include no blurred vision, no chest pain, no fatigue, no foot paresthesias, no foot ulcerations, no polydipsia, no polyphagia, no polyuria, no visual change, no weakness and no weight loss. There are no hypoglycemic complications. Symptoms are stable. There are no diabetic complications. Pertinent negatives for diabetic complications include no CVA, PVD or retinopathy. Risk factors for coronary artery disease include diabetes mellitus and dyslipidemia. Current diabetic treatment includes oral agent (dual therapy). She is compliant with treatment most of the time. Her weight is fluctuating minimally. She is following a generally healthy diet. She participates in exercise daily. There is no change in her home blood glucose trend. Her breakfast blood glucose is taken between 8-9 am. Her breakfast blood glucose range is generally 90-110 mg/dl. An ACE inhibitor/angiotensin II receptor blocker is being taken. She does not see a podiatrist.Eye exam is current.  Hypertension  This is a chronic problem. The current episode started more than 1 year ago. The problem has been gradually improving since onset. The problem is controlled. Pertinent negatives include no blurred vision, chest pain, headaches, malaise/fatigue, neck pain, orthopnea, palpitations or shortness of breath. There are no associated agents to hypertension. There are no known risk factors for coronary artery disease. Past treatments include ACE  inhibitors and diuretics. The current treatment provides moderate improvement. There are no compliance problems.  There is no history of angina, kidney disease, CAD/MI, CVA, heart failure, left ventricular hypertrophy, PVD, renovascular disease or retinopathy. There is no history of chronic renal disease or a hypertension causing med.  Hyperlipidemia  This is a chronic problem. The current episode started more than 1 year ago. The problem is controlled. Recent lipid tests were reviewed and are normal. She has no history of chronic renal disease. Factors aggravating her hyperlipidemia include thiazides. Pertinent negatives include no chest pain, focal weakness, myalgias or shortness of breath. Current antihyperlipidemic treatment includes statins. The current treatment provides moderate improvement of lipids. There are no compliance problems.     No problem-specific Assessment & Plan notes found for this encounter.   Past Medical History:  Diagnosis Date  . Diabetes mellitus without complication ([place])   . GERD (gastroesophageal reflux disease)   . Hyperlipidemia   . Hypertension     Past Surgical History:  Procedure Laterality Date  . bladder tack    . BREAST BIOPSY Left 10-15 yrs ago   benign  . CATARACT EXTRACTION Bilateral 05/2015  . CHOLECYSTECTOMY OPEN    . VAGINAL HYSTERECTOMY      Family History  Problem Relation Age of Onset  . Heart disease Maternal Aunt   . Heart disease Maternal Grandmother   . Breast cancer Neg Hx     Social History   Social History  . Marital status: Married    Spouse name: N/A  . Number of children: N/A  . Years of education: N/A   Occupational History  . Not on file.   Social History Main Topics  . Smoking status: Former Research scientist (life sciences)  .  Smokeless tobacco: Not on file  . Alcohol use No  . Drug use: No  . Sexual activity: Not Currently   Other Topics Concern  . Not on file   Social History Narrative  . No narrative on file    No Known  Allergies   Review of Systems  Constitutional: Negative for chills, fatigue, fever, malaise/fatigue and weight loss.  HENT: Negative for ear discharge, ear pain and sore throat.   Eyes: Negative for blurred vision.  Respiratory: Negative for cough, sputum production, shortness of breath and wheezing.   Cardiovascular: Negative for chest pain, palpitations, orthopnea and leg swelling.  Gastrointestinal: Negative for abdominal pain, blood in stool, constipation, diarrhea, heartburn, melena and nausea.  Genitourinary: Negative for dysuria, frequency, hematuria and urgency.  Musculoskeletal: Negative for back pain, joint pain, myalgias and neck pain.  Skin: Negative for rash.  Neurological: Negative for dizziness, tingling, sensory change, focal weakness, weakness and headaches.  Endo/Heme/Allergies: Negative for environmental allergies, polydipsia and polyphagia. Does not bruise/bleed easily.  Psychiatric/Behavioral: Negative for depression and suicidal ideas. The patient is not nervous/anxious and does not have insomnia.      Objective  Vitals:   08/20/16 0915  BP: 132/80  Pulse: 78  Temp: 98.2 F (36.8 C)  SpO2: 98%  Weight: 154 lb (69.9 kg)  Height: 5\' 5"  (1.651 m)    Physical Exam  Constitutional: She is well-developed, well-nourished, and in no distress. No distress.  HENT:  Head: Normocephalic and atraumatic.  Right Ear: Tympanic membrane, external ear and ear canal normal.  Left Ear: Tympanic membrane, external ear and ear canal normal.  Nose: Nose normal.  Mouth/Throat: Oropharynx is clear and moist.  Eyes: Conjunctivae and EOM are normal. Pupils are equal, round, and reactive to light. Right eye exhibits no discharge. Left eye exhibits no discharge.  Neck: Normal range of motion. Neck supple. No JVD present. No thyromegaly present.  Cardiovascular: Normal rate, regular rhythm, normal heart sounds and intact distal pulses.  Exam reveals no gallop and no friction rub.    No murmur heard. Pulmonary/Chest: Effort normal and breath sounds normal. She has no wheezes. She has no rales.  Abdominal: Soft. Normal aorta and bowel sounds are normal. She exhibits no mass. There is no hepatosplenomegaly. There is no tenderness. There is no guarding and no CVA tenderness.  Musculoskeletal: Normal range of motion. She exhibits no edema.  Lymphadenopathy:    She has no cervical adenopathy.  Neurological: She is alert. She has normal reflexes. No cranial nerve deficit.  Skin: Skin is warm and dry. She is not diaphoretic.  Psychiatric: Mood and affect normal.  Nursing note and vitals reviewed.     Assessment & Plan  Problem List Items Addressed This Visit      Cardiovascular and Mediastinum   Hypertension - Primary   Relevant Medications   lisinopril-hydrochlorothiazide (PRINZIDE,ZESTORETIC) 10-12.5 MG tablet   simvastatin (ZOCOR) 20 MG tablet     Digestive   Gastro-esophageal reflux disease without esophagitis   Relevant Medications   lansoprazole (PREVACID) 15 MG capsule     Endocrine   Diabetes (HCC)   Relevant Medications   metFORMIN (GLUCOPHAGE) 500 MG tablet   lisinopril-hydrochlorothiazide (PRINZIDE,ZESTORETIC) 10-12.5 MG tablet   simvastatin (ZOCOR) 20 MG tablet   metFORMIN (GLUCOPHAGE-XR) 500 MG 24 hr tablet   Other Relevant Orders   Hemoglobin A1c   Microalbumin / creatinine urine ratio   Diabetes mellitus with no complication (HCC)   Relevant Medications   metFORMIN (GLUCOPHAGE) 500  MG tablet   lisinopril-hydrochlorothiazide (PRINZIDE,ZESTORETIC) 10-12.5 MG tablet   simvastatin (ZOCOR) 20 MG tablet   metFORMIN (GLUCOPHAGE-XR) 500 MG 24 hr tablet     Other   Hyperlipidemia   Relevant Medications   lisinopril-hydrochlorothiazide (PRINZIDE,ZESTORETIC) 10-12.5 MG tablet   simvastatin (ZOCOR) 20 MG tablet        Dr. Mac[facility]  08/20/16

## 2016-08-21 LAB — HEMOGLOBIN A1C
Est. average glucose Bld gHb Est-mCnc: 140 mg/dL
HEMOGLOBIN A1C: 6.5 % — AB (ref 4.8–5.6)

## 2016-08-21 LAB — MICROALBUMIN / CREATININE URINE RATIO
Creatinine, Urine: 86.7 mg/dL
MICROALBUM., U, RANDOM: 6.4 ug/mL
Microalb/Creat Ratio: 7.4 mg/g creat (ref 0.0–30.0)

## 2016-09-17 ENCOUNTER — Other Ambulatory Visit: Payer: Self-pay | Admitting: Family Medicine

## 2016-09-17 DIAGNOSIS — E119 Type 2 diabetes mellitus without complications: Secondary | ICD-10-CM

## 2016-11-16 DIAGNOSIS — I10 Essential (primary) hypertension: Secondary | ICD-10-CM | POA: Diagnosis not present

## 2016-11-16 DIAGNOSIS — N183 Chronic kidney disease, stage 3 (moderate): Secondary | ICD-10-CM | POA: Diagnosis not present

## 2016-11-16 DIAGNOSIS — E119 Type 2 diabetes mellitus without complications: Secondary | ICD-10-CM | POA: Diagnosis not present

## 2016-11-16 DIAGNOSIS — N2581 Secondary hyperparathyroidism of renal origin: Secondary | ICD-10-CM | POA: Diagnosis not present

## 2016-12-21 DIAGNOSIS — N3000 Acute cystitis without hematuria: Secondary | ICD-10-CM | POA: Diagnosis not present

## 2017-01-26 ENCOUNTER — Other Ambulatory Visit: Payer: Self-pay

## 2017-02-10 ENCOUNTER — Other Ambulatory Visit: Payer: Self-pay | Admitting: Family Medicine

## 2017-02-25 ENCOUNTER — Ambulatory Visit (INDEPENDENT_AMBULATORY_CARE_PROVIDER_SITE_OTHER): Payer: PPO | Admitting: Family Medicine

## 2017-02-25 ENCOUNTER — Encounter: Payer: Self-pay | Admitting: Family Medicine

## 2017-02-25 VITALS — BP 130/80 | HR 80 | Ht 65.0 in | Wt 150.0 lb

## 2017-02-25 DIAGNOSIS — K219 Gastro-esophageal reflux disease without esophagitis: Secondary | ICD-10-CM | POA: Diagnosis not present

## 2017-02-25 DIAGNOSIS — E782 Mixed hyperlipidemia: Secondary | ICD-10-CM | POA: Diagnosis not present

## 2017-02-25 DIAGNOSIS — E119 Type 2 diabetes mellitus without complications: Secondary | ICD-10-CM

## 2017-02-25 DIAGNOSIS — R1310 Dysphagia, unspecified: Secondary | ICD-10-CM | POA: Diagnosis not present

## 2017-02-25 DIAGNOSIS — R1319 Other dysphagia: Secondary | ICD-10-CM

## 2017-02-25 MED ORDER — FISH OIL 1000 MG PO CPDR: 1.0000 | DELAYED_RELEASE_CAPSULE | ORAL | 3 refills | Status: AC

## 2017-02-25 MED ORDER — LANSOPRAZOLE 15 MG PO CPDR: 15.0000 mg | DELAYED_RELEASE_CAPSULE | ORAL | 1 refills | Status: DC

## 2017-02-25 MED ORDER — SITAGLIPTIN PHOSPHATE 100 MG PO TABS: ORAL_TABLET | ORAL | 1 refills | Status: DC

## 2017-02-25 MED ORDER — METFORMIN HCL 500 MG PO TABS: 500.0000 mg | ORAL_TABLET | Freq: Two times a day (BID) | ORAL | 1 refills | Status: DC

## 2017-02-25 MED ORDER — SIMVASTATIN 20 MG PO TABS: 20.0000 mg | ORAL_TABLET | Freq: Every day | ORAL | 1 refills | Status: DC

## 2017-02-25 NOTE — Progress Notes (Signed)
Name: Jordan Thornton   MRN: [id number]    DOB: 09-20-1929   Date:02/25/2017       Progress Note  Subjective  Chief Complaint  Chief Complaint  Patient presents with  . Hyperlipidemia  . Gastroesophageal Reflux  . Diabetes    Hyperlipidemia  This is a chronic problem. The current episode started more than 1 year ago. The problem is controlled. Recent lipid tests were reviewed and are normal. She has no history of chronic renal disease, diabetes, hypothyroidism, obesity or nephrotic syndrome. Factors aggravating her hyperlipidemia include thiazides. Pertinent negatives include no chest pain, focal sensory loss, focal weakness, leg pain, myalgias or shortness of breath. Current antihyperlipidemic treatment includes statins (omega 3). The current treatment provides mild improvement of lipids. There are no compliance problems.  There are no known risk factors for coronary artery disease.  Gastroesophageal Reflux  She complains of dysphagia. She reports no abdominal pain, no chest pain, no choking, no coughing, no heartburn, no nausea, no sore throat, no stridor or no wheezing. This is a new problem. The symptoms are aggravated by certain foods (beef). Associated symptoms include weight loss. Pertinent negatives include no anemia, fatigue, melena, muscle weakness or orthopnea. She has tried a PPI for the symptoms. The treatment provided moderate relief. Past procedures do not include an abdominal ultrasound, an EGD, esophageal manometry, esophageal pH monitoring or a UGI.  Diabetes  She presents for her follow-up diabetic visit. She has type 2 diabetes mellitus. Her disease course has been stable. There are no hypoglycemic associated symptoms. Pertinent negatives for hypoglycemia include no dizziness, headaches or nervousness/anxiousness. Associated symptoms include weight loss. Pertinent negatives for diabetes include no blurred vision, no chest pain, no fatigue, no foot paresthesias, no foot  ulcerations, no polydipsia, no polyphagia, no polyuria and no weakness. There are no hypoglycemic complications. Symptoms are stable. There are no diabetic complications. There are no known risk factors for coronary artery disease. She is compliant with treatment all of the time. She is following a generally healthy diet. She participates in exercise daily. Her home blood glucose trend is fluctuating minimally. Her breakfast blood glucose is taken between 7-8 am. Her breakfast blood glucose range is generally 90-110 mg/dl. An ACE inhibitor/angiotensin II receptor blocker is being taken. She does not see a podiatrist.Eye exam is current.    No problem-specific Assessment & Plan notes found for this encounter.   Past Medical History:  Diagnosis Date  . Diabetes mellitus without complication ([place])   . GERD (gastroesophageal reflux disease)   . Hyperlipidemia   . Hypertension     Past Surgical History:  Procedure Laterality Date  . bladder tack    . BREAST BIOPSY Left 10-15 yrs ago   benign  . CATARACT EXTRACTION Bilateral 05/2015  . CHOLECYSTECTOMY OPEN    . VAGINAL HYSTERECTOMY      Family History  Problem Relation Age of Onset  . Heart disease Maternal Aunt   . Heart disease Maternal Grandmother   . Breast cancer Neg Hx     Social History   Social History  . Marital status: Married    Spouse name: N/A  . Number of children: N/A  . Years of education: N/A   Occupational History  . Not on file.   Social History Main Topics  . Smoking status: Former Research scientist (life sciences)  . Smokeless tobacco: Never Used  . Alcohol use No  . Drug use: No  . Sexual activity: Not Currently   Other Topics Concern  .  Not on file   Social History Narrative  . No narrative on file    No Known Allergies  Outpatient Medications Prior to Visit  Medication Sig Dispense Refill  . ADVOCATE LANCETS MISC use once daily as directed to test blood sugar 100 each 0  . Blood Glucose Monitoring Suppl (FREESTYLE  LITE) DEVI use once daily as directed to test blood sugar 1 each 0  . FREESTYLE LITE test strip use once daily as directed to test blood sugar 100 each 0  . Lancet Devices (ADVOCATE LANCING DEVICE) MISC use once daily as directed to test blood sugar 1 each 0  . JANUVIA 100 MG tablet TAKE ONE TABLET BY MOUTH ONCE DAILY SCHEDULE  MED  REFILL  FOR  JUNE 30 tablet 5  . lansoprazole (PREVACID) 15 MG capsule Take 1 capsule (15 mg total) by mouth 1 day or 1 dose. 90 capsule 1  . metFORMIN (GLUCOPHAGE) 500 MG tablet Take 1 tablet (500 mg total) by mouth 2 (two) times daily. 90 tablet 1  . Omega-3 Fatty Acids (FISH OIL) 1000 MG CPDR Take by mouth.    . simvastatin (ZOCOR) 20 MG tablet Take 1 tablet (20 mg total) by mouth daily at 6 PM. 90 tablet 1  . ergocalciferol (VITAMIN D2) 50000 units capsule Take 50,000 Units by mouth once a week.    Marland Kitchen lisinopril-hydrochlorothiazide (PRINZIDE,ZESTORETIC) 10-12.5 MG tablet Take 1 tablet by mouth daily. 90 tablet 1  . LORazepam (ATIVAN) 0.5 MG tablet     . metFORMIN (GLUCOPHAGE-XR) 500 MG 24 hr tablet TAKE ONE TABLET BY MOUTH TWICE DAILY 180 tablet 0   No facility-administered medications prior to visit.     Review of Systems  Constitutional: Positive for weight loss. Negative for chills, fatigue, fever and malaise/fatigue.  HENT: Negative for ear discharge, ear pain and sore throat.   Eyes: Negative for blurred vision.  Respiratory: Negative for cough, sputum production, choking, shortness of breath and wheezing.   Cardiovascular: Negative for chest pain, palpitations and leg swelling.  Gastrointestinal: Positive for dysphagia. Negative for abdominal pain, blood in stool, constipation, diarrhea, heartburn, melena and nausea.  Genitourinary: Negative for dysuria, frequency, hematuria and urgency.  Musculoskeletal: Negative for back pain, joint pain, myalgias, muscle weakness and neck pain.  Skin: Negative for rash.  Neurological: Negative for dizziness,  tingling, sensory change, focal weakness, weakness and headaches.  Endo/Heme/Allergies: Negative for environmental allergies, polydipsia and polyphagia. Does not bruise/bleed easily.  Psychiatric/Behavioral: Negative for depression and suicidal ideas. The patient is not nervous/anxious and does not have insomnia.      Objective  Vitals:   02/25/17 1010  BP: 130/80  Pulse: 80  Weight: 150 lb (68 kg)  Height: 5\' 5"  (1.651 m)    Physical Exam  Constitutional: She is well-developed, well-nourished, and in no distress. No distress.  HENT:  Head: Normocephalic and atraumatic.  Right Ear: External ear normal.  Left Ear: External ear normal.  Nose: Nose normal.  Mouth/Throat: Oropharynx is clear and moist.  Eyes: Conjunctivae and EOM are normal. Pupils are equal, round, and reactive to light. Right eye exhibits no discharge. Left eye exhibits no discharge.  Neck: Normal range of motion. Neck supple. No JVD present. No thyromegaly present.  Cardiovascular: Normal rate, regular rhythm, normal heart sounds and intact distal pulses.  Exam reveals no gallop and no friction rub.   No murmur heard. Pulmonary/Chest: Effort normal and breath sounds normal. She has no rales.  Abdominal: Soft. Bowel sounds are normal.  She exhibits no mass. There is no tenderness. There is no guarding.  Musculoskeletal: Normal range of motion. She exhibits no edema.  Lymphadenopathy:    She has no cervical adenopathy.  Neurological: She is alert. She has normal reflexes.  Skin: Skin is warm and dry. She is not diaphoretic.  Psychiatric: Mood and affect normal.  Nursing note and vitals reviewed.     Assessment & Plan  Problem List Items Addressed This Visit      Digestive   Gastro-esophageal reflux disease without esophagitis   Relevant Medications   lansoprazole (PREVACID) 15 MG capsule     Endocrine   Diabetes mellitus with no complication (HCC)   Relevant Medications    lisinopril-hydrochlorothiazide (PRINZIDE,ZESTORETIC) 20-12.5 MG tablet   metFORMIN (GLUCOPHAGE) 500 MG tablet   sitaGLIPtin (JANUVIA) 100 MG tablet   simvastatin (ZOCOR) 20 MG tablet   Other Relevant Orders   Lipid Profile   Hemoglobin A1c     Other   Hyperlipidemia   Relevant Medications   lisinopril-hydrochlorothiazide (PRINZIDE,ZESTORETIC) 20-12.5 MG tablet   simvastatin (ZOCOR) 20 MG tablet   Omega-3 Fatty Acids (FISH OIL) 1000 MG CPDR   Other Relevant Orders   Lipid Profile    Other Visit Diagnoses    Esophageal dysphagia    -  Primary   Relevant Orders   DG Esophagus      Meds ordered this encounter  Medications  . metFORMIN (GLUCOPHAGE) 500 MG tablet    Sig: Take 1 tablet (500 mg total) by mouth 2 (two) times daily.    Dispense:  90 tablet    Refill:  1  . sitaGLIPtin (JANUVIA) 100 MG tablet    Sig: TAKE ONE TABLET BY MOUTH ONCE DAILY SCHEDULE  MED  REFILL  FOR  JUNE    Dispense:  90 tablet    Refill:  1    Please consider 90 day supplies to promote better adherence  . lansoprazole (PREVACID) 15 MG capsule    Sig: Take 1 capsule (15 mg total) by mouth 1 day or 1 dose.    Dispense:  90 capsule    Refill:  1  . simvastatin (ZOCOR) 20 MG tablet    Sig: Take 1 tablet (20 mg total) by mouth daily at 6 PM.    Dispense:  90 tablet    Refill:  1  . Omega-3 Fatty Acids (FISH OIL) 1000 MG CPDR    Sig: Take 1 capsule by mouth 1 day or 1 dose.    Dispense:  100 capsule    Refill:  3      Dr. Otilio Miu [facility]  02/25/17

## 2017-02-26 LAB — LIPID PANEL
CHOL/HDL RATIO: 2.7 ratio (ref 0.0–4.4)
Cholesterol, Total: 183 mg/dL (ref 100–199)
HDL: 67 mg/dL (ref >39)
LDL CALC: 94 mg/dL (ref 0–99)
TRIGLYCERIDES: 112 mg/dL (ref 0–149)
VLDL Cholesterol Cal: 22 mg/dL (ref 5–40)

## 2017-02-26 LAB — HEMOGLOBIN A1C
Est. average glucose Bld gHb Est-mCnc: 140 mg/dL
HEMOGLOBIN A1C: 6.5 % — AB (ref 4.8–5.6)

## 2017-03-05 ENCOUNTER — Ambulatory Visit
Admission: RE | Admit: 2017-03-05 | Discharge: 2017-03-05 | Disposition: A | Discharge status: Home / Self Care | Payer: PPO | Source: Ambulatory Visit | Attending: Family Medicine | Admitting: Family Medicine

## 2017-03-05 DIAGNOSIS — R1319 Other dysphagia: Secondary | ICD-10-CM

## 2017-03-05 DIAGNOSIS — K225 Diverticulum of esophagus, acquired: Secondary | ICD-10-CM | POA: Diagnosis not present

## 2017-03-05 DIAGNOSIS — K219 Gastro-esophageal reflux disease without esophagitis: Secondary | ICD-10-CM | POA: Insufficient documentation

## 2017-03-05 DIAGNOSIS — R1310 Dysphagia, unspecified: Secondary | ICD-10-CM | POA: Insufficient documentation

## 2017-03-16 DIAGNOSIS — N183 Chronic kidney disease, stage 3 (moderate): Secondary | ICD-10-CM | POA: Diagnosis not present

## 2017-03-16 DIAGNOSIS — N2581 Secondary hyperparathyroidism of renal origin: Secondary | ICD-10-CM | POA: Diagnosis not present

## 2017-03-16 DIAGNOSIS — I10 Essential (primary) hypertension: Secondary | ICD-10-CM | POA: Diagnosis not present

## 2017-03-16 DIAGNOSIS — E119 Type 2 diabetes mellitus without complications: Secondary | ICD-10-CM | POA: Diagnosis not present

## 2017-05-26 ENCOUNTER — Other Ambulatory Visit: Payer: Self-pay | Admitting: Family Medicine

## 2017-05-26 DIAGNOSIS — E119 Type 2 diabetes mellitus without complications: Secondary | ICD-10-CM

## 2017-05-27 ENCOUNTER — Other Ambulatory Visit: Payer: Self-pay

## 2017-06-17 DIAGNOSIS — Z9842 Cataract extraction status, left eye: Secondary | ICD-10-CM | POA: Diagnosis not present

## 2017-06-17 DIAGNOSIS — H52223 Regular astigmatism, bilateral: Secondary | ICD-10-CM | POA: Diagnosis not present

## 2017-06-17 DIAGNOSIS — Z9841 Cataract extraction status, right eye: Secondary | ICD-10-CM | POA: Diagnosis not present

## 2017-06-17 DIAGNOSIS — E119 Type 2 diabetes mellitus without complications: Secondary | ICD-10-CM | POA: Diagnosis not present

## 2017-06-17 LAB — HM DIABETES EYE EXAM

## 2017-06-21 ENCOUNTER — Other Ambulatory Visit: Payer: Self-pay

## 2017-06-21 DIAGNOSIS — I119 Hypertensive heart disease without heart failure: Secondary | ICD-10-CM | POA: Diagnosis not present

## 2017-06-21 DIAGNOSIS — Z7984 Long term (current) use of oral hypoglycemic drugs: Secondary | ICD-10-CM | POA: Diagnosis not present

## 2017-06-21 DIAGNOSIS — E119 Type 2 diabetes mellitus without complications: Secondary | ICD-10-CM | POA: Diagnosis not present

## 2017-07-21 DIAGNOSIS — N2581 Secondary hyperparathyroidism of renal origin: Secondary | ICD-10-CM | POA: Diagnosis not present

## 2017-07-21 DIAGNOSIS — N183 Chronic kidney disease, stage 3 (moderate): Secondary | ICD-10-CM | POA: Diagnosis not present

## 2017-07-21 DIAGNOSIS — E119 Type 2 diabetes mellitus without complications: Secondary | ICD-10-CM | POA: Diagnosis not present

## 2017-07-21 DIAGNOSIS — I10 Essential (primary) hypertension: Secondary | ICD-10-CM | POA: Diagnosis not present

## 2017-08-09 ENCOUNTER — Other Ambulatory Visit: Payer: Self-pay | Admitting: Internal Medicine

## 2017-08-09 DIAGNOSIS — Z1231 Encounter for screening mammogram for malignant neoplasm of breast: Secondary | ICD-10-CM

## 2017-08-10 ENCOUNTER — Other Ambulatory Visit: Payer: Self-pay | Admitting: Family Medicine

## 2017-08-19 ENCOUNTER — Telehealth: Payer: Self-pay | Admitting: Family Medicine

## 2017-08-19 NOTE — Telephone Encounter (Signed)
Called pt to sched for AWV with Nurse Health Advisor.  C/b #  [phone] on Skype [email or web address] if you have questions ° °

## 2017-08-25 ENCOUNTER — Ambulatory Visit
Admission: RE | Admit: 2017-08-25 | Discharge: 2017-08-25 | Disposition: A | Discharge status: Home / Self Care | Payer: PPO | Source: Ambulatory Visit | Attending: Internal Medicine | Admitting: Internal Medicine

## 2017-08-25 ENCOUNTER — Encounter (INDEPENDENT_AMBULATORY_CARE_PROVIDER_SITE_OTHER): Payer: Self-pay

## 2017-08-25 DIAGNOSIS — Z1231 Encounter for screening mammogram for malignant neoplasm of breast: Secondary | ICD-10-CM | POA: Diagnosis not present

## 2017-08-27 ENCOUNTER — Encounter: Payer: Self-pay | Admitting: Family Medicine

## 2017-08-27 ENCOUNTER — Ambulatory Visit: Payer: PPO | Admitting: Family Medicine

## 2017-08-27 VITALS — BP 120/80 | HR 68 | Ht 65.0 in | Wt 156.0 lb

## 2017-08-27 DIAGNOSIS — E119 Type 2 diabetes mellitus without complications: Secondary | ICD-10-CM | POA: Diagnosis not present

## 2017-08-27 DIAGNOSIS — E782 Mixed hyperlipidemia: Secondary | ICD-10-CM | POA: Diagnosis not present

## 2017-08-27 DIAGNOSIS — M109 Gout, unspecified: Secondary | ICD-10-CM | POA: Diagnosis not present

## 2017-08-27 DIAGNOSIS — R69 Illness, unspecified: Secondary | ICD-10-CM | POA: Diagnosis not present

## 2017-08-27 DIAGNOSIS — K219 Gastro-esophageal reflux disease without esophagitis: Secondary | ICD-10-CM

## 2017-08-27 MED ORDER — COLCHICINE 0.6 MG PO TABS: 0.6000 mg | ORAL_TABLET | Freq: Every day | ORAL | 0 refills | Status: DC

## 2017-08-27 MED ORDER — METFORMIN HCL 500 MG PO TABS: 500.0000 mg | ORAL_TABLET | Freq: Two times a day (BID) | ORAL | 1 refills | Status: DC

## 2017-08-27 MED ORDER — LANSOPRAZOLE 15 MG PO CPDR: 15.0000 mg | DELAYED_RELEASE_CAPSULE | ORAL | 1 refills | Status: DC

## 2017-08-27 MED ORDER — SIMVASTATIN 20 MG PO TABS: 20.0000 mg | ORAL_TABLET | Freq: Every day | ORAL | 1 refills | Status: DC

## 2017-08-27 NOTE — Progress Notes (Signed)
Name: Jordan Thornton   MRN: [id number]    DOB: 07/10/1930   Date:08/27/2017       Progress Note  Subjective  Chief Complaint  Chief Complaint  Patient presents with  . Gastroesophageal Reflux  . Diabetes  . Hyperlipidemia    Gastroesophageal Reflux  She reports no abdominal pain, no belching, no chest pain, no choking, no coughing, no dysphagia, no early satiety, no globus sensation, no heartburn, no hoarse voice, no nausea, no sore throat, no stridor, no tooth decay, no water brash or no wheezing. This is a chronic problem. The problem occurs occasionally. The problem has been gradually improving. The symptoms are aggravated by certain foods. Pertinent negatives include no fatigue, melena, orthopnea or weight loss. There are no known risk factors. She has tried a PPI for the symptoms. The treatment provided moderate relief.  Diabetes  She presents for her follow-up diabetic visit. She has type 2 diabetes mellitus. Her disease course has been stable. There are no hypoglycemic associated symptoms. Pertinent negatives for hypoglycemia include no dizziness, headaches or nervousness/anxiousness. Pertinent negatives for diabetes include no blurred vision, no chest pain, no fatigue, no foot paresthesias, no foot ulcerations, no polydipsia, no polyphagia, no polyuria, no visual change, no weakness and no weight loss. There are no hypoglycemic complications. Symptoms are stable. There are no diabetic complications. There are no known risk factors for coronary artery disease. Current diabetic treatment includes oral agent (dual therapy) (januvia/metformin). She is following a generally healthy diet. She participates in exercise intermittently. Her breakfast blood glucose is taken between 8-9 am. Her breakfast blood glucose range is generally 110-130 mg/dl. An ACE inhibitor/angiotensin II receptor blocker is being taken. She does not see a podiatrist.Eye exam is current.  Hyperlipidemia  This is a  chronic problem. The current episode started more than 1 year ago. The problem is controlled. Recent lipid tests were reviewed and are normal. She has no history of chronic renal disease, diabetes, hypothyroidism, liver disease, obesity or nephrotic syndrome. Pertinent negatives include no chest pain, focal weakness, myalgias or shortness of breath. The current treatment provides moderate improvement of lipids. There are no compliance problems.  Risk factors for coronary artery disease include dyslipidemia, diabetes mellitus, hypertension and post-menopausal.  Toe Pain   The incident occurred more than 1 week ago. There was no injury mechanism. The pain is present in the right toes. The pain is at a severity of 3/10. The pain is moderate. The pain has been fluctuating since onset. Pertinent negatives include no tingling. She has tried nothing for the symptoms. The treatment provided mild relief.    No problem-specific Assessment & Plan notes found for this encounter.   Past Medical History:  Diagnosis Date  . Diabetes mellitus without complication ([place])   . GERD (gastroesophageal reflux disease)   . Hyperlipidemia   . Hypertension     Past Surgical History:  Procedure Laterality Date  . bladder tack    . BREAST BIOPSY Left 10-15 yrs ago   benign  . CATARACT EXTRACTION Bilateral 05/2015  . CHOLECYSTECTOMY OPEN    . VAGINAL HYSTERECTOMY      Family History  Problem Relation Age of Onset  . Heart disease Maternal Aunt   . Heart disease Maternal Grandmother   . Breast cancer Neg Hx     Social History   Socioeconomic History  . Marital status: Married    Spouse name: Not on file  . Number of children: Not on file  . Years  of education: Not on file  . Highest education level: Not on file  Social Needs  . Financial resource strain: Not on file  . Food insecurity - worry: Not on file  . Food insecurity - inability: Not on file  . Transportation needs - medical: Not on file  .  Transportation needs - non-medical: Not on file  Occupational History  . Not on file  Tobacco Use  . Smoking status: Former Research scientist (life sciences)  . Smokeless tobacco: Never Used  Substance and Sexual Activity  . Alcohol use: No    Alcohol/week: 0.0 oz  . Drug use: No  . Sexual activity: Not Currently  Other Topics Concern  . Not on file  Social History Narrative  . Not on file    No Known Allergies  Outpatient Medications Prior to Visit  Medication Sig Dispense Refill  . ADVOCATE LANCETS MISC use once daily as directed to test blood sugar 100 each 0  . Blood Glucose Monitoring Suppl (FREESTYLE LITE) DEVI use once daily as directed to test blood sugar 1 each 0  . Cholecalciferol (VITAMIN D) 2000 units CAPS Take 1 capsule by mouth daily. otc    . FREESTYLE LITE test strip use once daily as directed to test blood sugar 100 each 0  . JANUVIA 100 MG tablet TAKE 1 TABLET BY MOUTH ONCE DAILY. NEED  TO  SCHEDULE  APPOINTMENT  IN  JUNE 90 tablet 0  . Lancet Devices (ADVOCATE LANCING DEVICE) MISC use once daily as directed to test blood sugar 1 each 0  . lisinopril-hydrochlorothiazide (PRINZIDE,ZESTORETIC) 20-12.5 MG tablet Take 1 tablet by mouth daily. Dr Holley Raring    . lansoprazole (PREVACID) 15 MG capsule Take 1 capsule (15 mg total) by mouth 1 day or 1 dose. 90 capsule 1  . metFORMIN (GLUCOPHAGE) 500 MG tablet Take 1 tablet (500 mg total) by mouth 2 (two) times daily. 90 tablet 1  . simvastatin (ZOCOR) 20 MG tablet Take 1 tablet (20 mg total) by mouth daily at 6 PM. 90 tablet 1  . metFORMIN (GLUCOPHAGE-XR) 500 MG 24 hr tablet TAKE 1 TABLET BY MOUTH TWICE DAILY 60 tablet 0   No facility-administered medications prior to visit.     Review of Systems  Constitutional: Negative for chills, fatigue, fever, malaise/fatigue and weight loss.  HENT: Negative for ear discharge, ear pain, hoarse voice and sore throat.   Eyes: Negative for blurred vision.  Respiratory: Negative for cough, sputum production,  choking, shortness of breath and wheezing.   Cardiovascular: Negative for chest pain, palpitations and leg swelling.  Gastrointestinal: Negative for abdominal pain, blood in stool, constipation, diarrhea, dysphagia, heartburn, melena and nausea.  Genitourinary: Negative for dysuria, frequency, hematuria and urgency.  Musculoskeletal: Negative for back pain, joint pain, myalgias and neck pain.  Skin: Negative for rash.  Neurological: Negative for dizziness, tingling, sensory change, focal weakness, weakness and headaches.  Endo/Heme/Allergies: Negative for environmental allergies, polydipsia and polyphagia. Does not bruise/bleed easily.  Psychiatric/Behavioral: Negative for depression and suicidal ideas. The patient is not nervous/anxious and does not have insomnia.      Objective  Vitals:   08/27/17 1047  BP: 120/80  Pulse: 68  Weight: 156 lb (70.8 kg)  Height: 5\' 5"  (1.651 m)    Physical Exam  Constitutional: She is well-developed, well-nourished, and in no distress. No distress.  HENT:  Head: Normocephalic and atraumatic.  Right Ear: External ear normal.  Left Ear: External ear normal.  Nose: Nose normal.  Mouth/Throat: Oropharynx is  clear and moist.  Eyes: Conjunctivae and EOM are normal. Pupils are equal, round, and reactive to light. Right eye exhibits no discharge. Left eye exhibits no discharge.  Neck: Normal range of motion. Neck supple. No JVD present. No thyromegaly present.  Cardiovascular: Normal rate, regular rhythm, normal heart sounds and intact distal pulses. Exam reveals no gallop and no friction rub.  No murmur heard. Pulmonary/Chest: Effort normal and breath sounds normal. She has no wheezes. She has no rales.  Abdominal: Soft. Bowel sounds are normal. She exhibits no mass. There is no tenderness. There is no guarding.  Musculoskeletal: Normal range of motion. She exhibits no edema.  Lymphadenopathy:    She has no cervical adenopathy.  Neurological: She is  alert. She has normal reflexes.  Skin: Skin is warm and dry. She is not diaphoretic.  Psychiatric: Mood and affect normal.  Nursing note and vitals reviewed.     Assessment & Plan  Problem List Items Addressed This Visit      Digestive   Gastro-esophageal reflux disease without esophagitis   Relevant Medications   lansoprazole (PREVACID) 15 MG capsule     Endocrine   Diabetes mellitus with no complication (HCC) - Primary   Relevant Medications   metFORMIN (GLUCOPHAGE) 500 MG tablet   simvastatin (ZOCOR) 20 MG tablet   Other Relevant Orders   HgB A1c     Other   Hyperlipidemia   Relevant Medications   simvastatin (ZOCOR) 20 MG tablet   Other Relevant Orders   Renal Function Panel    Other Visit Diagnoses    Taking multiple drugs for chronic disease       Relevant Orders   Hepatic function panel   Acute gout involving toe of right foot, unspecified cause       Relevant Medications   colchicine 0.6 MG tablet   Other Relevant Orders   Uric acid      Meds ordered this encounter  Medications  . lansoprazole (PREVACID) 15 MG capsule    Sig: Take 1 capsule (15 mg total) by mouth 1 day or 1 dose.    Dispense:  90 capsule    Refill:  1  . metFORMIN (GLUCOPHAGE) 500 MG tablet    Sig: Take 1 tablet (500 mg total) by mouth 2 (two) times daily.    Dispense:  180 tablet    Refill:  1  . simvastatin (ZOCOR) 20 MG tablet    Sig: Take 1 tablet (20 mg total) by mouth daily at 6 PM.    Dispense:  90 tablet    Refill:  1  . colchicine 0.6 MG tablet    Sig: Take 1 tablet (0.6 mg total) by mouth daily.    Dispense:  10 tablet    Refill:  0      Dr. Otilio Miu [facility]  08/27/17

## 2017-08-28 LAB — RENAL FUNCTION PANEL
ALBUMIN: 4.2 g/dL (ref 3.5–4.7)
BUN/Creatinine Ratio: 12 (ref 12–28)
BUN: 18 mg/dL (ref 8–27)
CALCIUM: 9.4 mg/dL (ref 8.7–10.3)
CO2: 26 mmol/L (ref 20–29)
Chloride: 107 mmol/L — ABNORMAL HIGH (ref 96–106)
Creatinine, Ser: 1.49 mg/dL — ABNORMAL HIGH (ref 0.57–1.00)
GFR calc Af Amer: 37 mL/min/{1.73_m2} — ABNORMAL LOW (ref >59)
GFR calc non Af Amer: 32 mL/min/{1.73_m2} — ABNORMAL LOW (ref >59)
Glucose: 109 mg/dL — ABNORMAL HIGH (ref 65–99)
PHOSPHORUS: 3.7 mg/dL (ref 2.5–4.5)
POTASSIUM: 4.5 mmol/L (ref 3.5–5.2)
SODIUM: 148 mmol/L — AB (ref 134–144)

## 2017-08-28 LAB — URIC ACID: Uric Acid: 7.3 mg/dL — ABNORMAL HIGH (ref 2.5–7.1)

## 2017-08-28 LAB — HEPATIC FUNCTION PANEL
ALT: 7 IU/L (ref 0–32)
AST: 14 IU/L (ref 0–40)
Alkaline Phosphatase: 58 IU/L (ref 39–117)
BILIRUBIN TOTAL: 0.4 mg/dL (ref 0.0–1.2)
Bilirubin, Direct: 0.14 mg/dL (ref 0.00–0.40)
TOTAL PROTEIN: 6.8 g/dL (ref 6.0–8.5)

## 2017-08-28 LAB — HEMOGLOBIN A1C
ESTIMATED AVERAGE GLUCOSE: 157 mg/dL
Hgb A1c MFr Bld: 7.1 % — ABNORMAL HIGH (ref 4.8–5.6)

## 2017-10-06 ENCOUNTER — Telehealth: Payer: Self-pay | Admitting: Family Medicine

## 2017-10-06 NOTE — Telephone Encounter (Signed)
Pt states she needs a prescription for her test strips (freestyle) sent to [facility] here in [place]. Says she stop getting them and had run out already. Please advise.

## 2017-10-12 ENCOUNTER — Other Ambulatory Visit: Payer: Self-pay

## 2017-10-12 DIAGNOSIS — E119 Type 2 diabetes mellitus without complications: Secondary | ICD-10-CM

## 2017-10-12 MED ORDER — GLUCOSE BLOOD VI STRP: 1.0000 | ORAL_STRIP | Freq: Every day | 0 refills | Status: DC

## 2017-10-18 ENCOUNTER — Other Ambulatory Visit: Payer: Self-pay

## 2017-10-18 DIAGNOSIS — E119 Type 2 diabetes mellitus without complications: Secondary | ICD-10-CM

## 2017-10-18 MED ORDER — SITAGLIPTIN PHOSPHATE 100 MG PO TABS: 100.0000 mg | ORAL_TABLET | Freq: Every day | ORAL | 0 refills | Status: DC

## 2017-11-24 DIAGNOSIS — N2581 Secondary hyperparathyroidism of renal origin: Secondary | ICD-10-CM | POA: Diagnosis not present

## 2017-11-24 DIAGNOSIS — N183 Chronic kidney disease, stage 3 (moderate): Secondary | ICD-10-CM | POA: Diagnosis not present

## 2017-11-24 DIAGNOSIS — I10 Essential (primary) hypertension: Secondary | ICD-10-CM | POA: Diagnosis not present

## 2017-11-24 DIAGNOSIS — E119 Type 2 diabetes mellitus without complications: Secondary | ICD-10-CM | POA: Diagnosis not present

## 2018-01-24 ENCOUNTER — Telehealth: Payer: Self-pay

## 2018-01-24 NOTE — Telephone Encounter (Signed)
AWV w/ [facility] scheduled 02/09/18

## 2018-02-09 ENCOUNTER — Ambulatory Visit (INDEPENDENT_AMBULATORY_CARE_PROVIDER_SITE_OTHER): Payer: PPO

## 2018-02-09 VITALS — BP 118/62 | HR 60 | Temp 98.0°F | Resp 12 | Ht 65.0 in | Wt 151.0 lb

## 2018-02-09 DIAGNOSIS — Z0000 Encounter for general adult medical examination without abnormal findings: Secondary | ICD-10-CM | POA: Diagnosis not present

## 2018-02-09 NOTE — Patient Instructions (Signed)
Jordan Thornton , Thank you for taking time to come for your Medicare Wellness Visit. I appreciate your ongoing commitment to your health goals. Please review the following plan we discussed and let me know if I can assist you in the future.   Screening recommendations/referrals: Colorectal Screening: No longer required Mammogram: No longer required Bone Density: No longer required  Vision and Dental Exams: Recommended annual ophthalmology exams for early detection of glaucoma and other disorders of the eye Recommended annual dental exams for proper oral hygiene  Diabetic Exams: Recommended annual diabetic eye exams for early detection of retinopathy Recommended annual diabetic foot exams for early detection of peripheral neuropathy.  Diabetic Eye Exam: Up to date Diabetic Foot Exam: Up to date  Vaccinations: Influenza vaccine: Up to date Pneumococcal vaccine: Up to date Tdap vaccine: Up to date Shingles vaccine: Please call your insurance company to determine your out of pocket expense for the Shingrix vaccine. You may also receive this vaccine at your local pharmacy or Health Dept.  Advanced directives: Advance directive discussed with you today. I have provided a copy for you to complete at home and have notarized. Once this is complete please bring a copy in to our office so we can scan it into your chart.  Conditions/risks identified: Recommend to drink at least 6-8 8oz glasses of water per day.  Next appointment: Please schedule your Annual Wellness Visit with your Nurse Health Advisor in one year.  Preventive Care 82 Years and Older, Female Preventive care refers to lifestyle choices and visits with your health care provider that can promote health and wellness. What does preventive care include?  A yearly physical exam. This is also called an annual well check.  Dental exams once or twice a year.  Routine eye exams. Ask your health care provider how often you should have  your eyes checked.  Personal lifestyle choices, including:  Daily care of your teeth and gums.  Regular physical activity.  Eating a healthy diet.  Avoiding tobacco and drug use.  Limiting alcohol use.  Practicing safe sex.  Taking low-dose aspirin every day.  Taking vitamin and mineral supplements as recommended by your health care provider. What happens during an annual well check? The services and screenings done by your health care provider during your annual well check will depend on your age, overall health, lifestyle risk factors, and family history of disease. Counseling  Your health care provider may ask you questions about your:  Alcohol use.  Tobacco use.  Drug use.  Emotional well-being.  Home and relationship well-being.  Sexual activity.  Eating habits.  History of falls.  Memory and ability to understand (cognition).  Work and work Statistician.  Reproductive health. Screening  You may have the following tests or measurements:  Height, weight, and BMI.  Blood pressure.  Lipid and cholesterol levels. These may be checked every 5 years, or more frequently if you are over 82 years old.  Skin check.  Lung cancer screening. You may have this screening every year starting at age 82 if you have a 30-pack-year history of smoking and currently smoke or have quit within the past 15 years.  Fecal occult blood test (FOBT) of the stool. You may have this test every year starting at age 82.  Flexible sigmoidoscopy or colonoscopy. You may have a sigmoidoscopy every 5 years or a colonoscopy every 10 years starting at age 82.  Hepatitis C blood test.  Hepatitis B blood test.  Sexually transmitted disease (  STD) testing.  Diabetes screening. This is done by checking your blood sugar (glucose) after you have not eaten for a while (fasting). You may have this done every 1-3 years.  Bone density scan. This is done to screen for osteoporosis. You may have  this done starting at age 82.  Mammogram. This may be done every 1-2 years. Talk to your health care provider about how often you should have regular mammograms. Talk with your health care provider about your test results, treatment options, and if necessary, the need for more tests. Vaccines  Your health care provider may recommend certain vaccines, such as:  Influenza vaccine. This is recommended every year.  Tetanus, diphtheria, and acellular pertussis (Tdap, Td) vaccine. You may need a Td booster every 10 years.  Zoster vaccine. You may need this after age 82.  Pneumococcal 13-valent conjugate (PCV13) vaccine. One dose is recommended after age 82.  Pneumococcal polysaccharide (PPSV23) vaccine. One dose is recommended after age 82. Talk to your health care provider about which screenings and vaccines you need and how often you need them. This information is not intended to replace advice given to you by your health care provider. Make sure you discuss any questions you have with your health care provider. Document Released: 09/27/2015 Document Revised: 05/20/2016 Document Reviewed: 07/02/2015 Elsevier Interactive Patient Education  2017 [place] Prevention in the Home Falls can cause injuries. They can happen to people of all ages. There are many things you can do to make your home safe and to help prevent falls. What can I do on the outside of my home?  Regularly fix the edges of walkways and driveways and fix any cracks.  Remove anything that might make you trip as you walk through a door, such as a raised step or threshold.  Trim any bushes or trees on the path to your home.  Use bright outdoor lighting.  Clear any walking paths of anything that might make someone trip, such as rocks or tools.  Regularly check to see if handrails are loose or broken. Make sure that both sides of any steps have handrails.  Any raised decks and porches should have guardrails on  the edges.  Have any leaves, snow, or ice cleared regularly.  Use sand or salt on walking paths during winter.  Clean up any spills in your garage right away. This includes oil or grease spills. What can I do in the bathroom?  Use night lights.  Install grab bars by the toilet and in the tub and shower. Do not use towel bars as grab bars.  Use non-skid mats or decals in the tub or shower.  If you need to sit down in the shower, use a plastic, non-slip stool.  Keep the floor dry. Clean up any water that spills on the floor as soon as it happens.  Remove soap buildup in the tub or shower regularly.  Attach bath mats securely with double-sided non-slip rug tape.  Do not have throw rugs and other things on the floor that can make you trip. What can I do in the bedroom?  Use night lights.  Make sure that you have a light by your bed that is easy to reach.  Do not use any sheets or blankets that are too big for your bed. They should not hang down onto the floor.  Have a firm chair that has side arms. You can use this for support while you get dressed.  Do  not have throw rugs and other things on the floor that can make you trip. What can I do in the kitchen?  Clean up any spills right away.  Avoid walking on wet floors.  Keep items that you use a lot in easy-to-reach places.  If you need to reach something above you, use a strong step stool that has a grab bar.  Keep electrical cords out of the way.  Do not use floor polish or wax that makes floors slippery. If you must use wax, use non-skid floor wax.  Do not have throw rugs and other things on the floor that can make you trip. What can I do with my stairs?  Do not leave any items on the stairs.  Make sure that there are handrails on both sides of the stairs and use them. Fix handrails that are broken or loose. Make sure that handrails are as long as the stairways.  Check any carpeting to make sure that it is firmly  attached to the stairs. Fix any carpet that is loose or worn.  Avoid having throw rugs at the top or bottom of the stairs. If you do have throw rugs, attach them to the floor with carpet tape.  Make sure that you have a light switch at the top of the stairs and the bottom of the stairs. If you do not have them, ask someone to add them for you. What else can I do to help prevent falls?  Wear shoes that:  Do not have high heels.  Have rubber bottoms.  Are comfortable and fit you well.  Are closed at the toe. Do not wear sandals.  If you use a stepladder:  Make sure that it is fully opened. Do not climb a closed stepladder.  Make sure that both sides of the stepladder are locked into place.  Ask someone to hold it for you, if possible.  Clearly mark and make sure that you can see:  Any grab bars or handrails.  First and last steps.  Where the edge of each step is.  Use tools that help you move around (mobility aids) if they are needed. These include:  Canes.  Walkers.  Scooters.  Crutches.  Turn on the lights when you go into a dark area. Replace any light bulbs as soon as they burn out.  Set up your furniture so you have a clear path. Avoid moving your furniture around.  If any of your floors are uneven, fix them.  If there are any pets around you, be aware of where they are.  Review your medicines with your doctor. Some medicines can make you feel dizzy. This can increase your chance of falling. Ask your doctor what other things that you can do to help prevent falls. This information is not intended to replace advice given to you by your health care provider. Make sure you discuss any questions you have with your health care provider. Document Released: 06/27/2009 Document Revised: 02/06/2016 Document Reviewed: 10/05/2014 Elsevier Interactive Patient Education  2017 [employer].
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
## 2018-02-21 ENCOUNTER — Other Ambulatory Visit: Payer: Self-pay | Admitting: Family Medicine

## 2018-02-21 DIAGNOSIS — E119 Type 2 diabetes mellitus without complications: Secondary | ICD-10-CM

## 2018-03-02 ENCOUNTER — Encounter: Payer: Self-pay | Admitting: Family Medicine

## 2018-03-02 ENCOUNTER — Ambulatory Visit (INDEPENDENT_AMBULATORY_CARE_PROVIDER_SITE_OTHER): Payer: PPO | Admitting: Family Medicine

## 2018-03-02 VITALS — BP 130/82 | HR 84 | Ht 65.0 in | Wt 149.0 lb

## 2018-03-02 DIAGNOSIS — E782 Mixed hyperlipidemia: Secondary | ICD-10-CM | POA: Diagnosis not present

## 2018-03-02 DIAGNOSIS — R079 Chest pain, unspecified: Secondary | ICD-10-CM

## 2018-03-02 DIAGNOSIS — F419 Anxiety disorder, unspecified: Secondary | ICD-10-CM | POA: Diagnosis not present

## 2018-03-02 DIAGNOSIS — E119 Type 2 diabetes mellitus without complications: Secondary | ICD-10-CM | POA: Diagnosis not present

## 2018-03-02 DIAGNOSIS — K219 Gastro-esophageal reflux disease without esophagitis: Secondary | ICD-10-CM

## 2018-03-02 DIAGNOSIS — I10 Essential (primary) hypertension: Secondary | ICD-10-CM | POA: Diagnosis not present

## 2018-03-02 MED ORDER — LANSOPRAZOLE 15 MG PO CPDR: 15.0000 mg | DELAYED_RELEASE_CAPSULE | ORAL | 1 refills | Status: DC

## 2018-03-02 MED ORDER — LISINOPRIL-HYDROCHLOROTHIAZIDE 20-12.5 MG PO TABS: 1.0000 | ORAL_TABLET | Freq: Every day | ORAL | 1 refills | Status: DC

## 2018-03-02 MED ORDER — SIMVASTATIN 20 MG PO TABS: 20.0000 mg | ORAL_TABLET | Freq: Every day | ORAL | 1 refills | Status: DC

## 2018-03-02 MED ORDER — METFORMIN HCL 500 MG PO TABS: 500.0000 mg | ORAL_TABLET | Freq: Two times a day (BID) | ORAL | 1 refills | Status: DC

## 2018-03-02 MED ORDER — SITAGLIPTIN PHOSPHATE 100 MG PO TABS: 100.0000 mg | ORAL_TABLET | Freq: Every day | ORAL | 0 refills | Status: DC

## 2018-03-02 MED ORDER — LORAZEPAM 0.5 MG PO TABS: 0.5000 mg | ORAL_TABLET | ORAL | 5 refills | Status: DC | PRN

## 2018-03-02 NOTE — Progress Notes (Signed)
renName: [place] McCoy Bible   MRN: [id number]    DOB: 13-Jul-1930   Date:03/02/2018       Progress Note  Subjective  Chief Complaint  Chief Complaint  Patient presents with  . Gastroesophageal Reflux  . Hypertension  . Diabetes  . Anxiety    was taking 0.5mg  ativan as needed  . Hyperlipidemia    Gastroesophageal Reflux  She reports no abdominal pain, no belching, no chest pain, no choking, no coughing, no dysphagia, no early satiety, no globus sensation, no heartburn, no hoarse voice, no nausea, no sore throat, no stridor, no tooth decay, no water brash or no wheezing. This is a chronic problem. The current episode started more than 1 year ago. The problem occurs constantly. The problem has been waxing and waning. The symptoms are aggravated by certain foods. Pertinent negatives include no anemia, fatigue, melena, muscle weakness, orthopnea or weight loss. There are no known risk factors. She has tried a PPI for the symptoms. The treatment provided moderate relief.  Hypertension  This is a chronic problem. The current episode started more than 1 year ago. The problem is unchanged. The problem is controlled. Associated symptoms include anxiety. Pertinent negatives include no blurred vision, chest pain, headaches, malaise/fatigue, neck pain, orthopnea, palpitations, peripheral edema, PND, shortness of breath or sweats. There are no associated agents to hypertension. Risk factors for coronary artery disease include dyslipidemia, stress and post-menopausal state. Past treatments include ACE inhibitors and diuretics. The current treatment provides moderate improvement. Compliance problems include diet.  There is no history of angina, kidney disease, CAD/MI, CVA, heart failure, left ventricular hypertrophy, PVD or retinopathy. There is no history of chronic renal disease, a hypertension causing med or renovascular disease.  Diabetes  She presents for her follow-up diabetic visit. She has type 2  diabetes mellitus. Her disease course has been stable. Hypoglycemia symptoms include nervousness/anxiousness. Pertinent negatives for hypoglycemia include no confusion, dizziness, headaches, hunger or sweats. Pertinent negatives for diabetes include no blurred vision, no chest pain, no fatigue, no foot paresthesias, no foot ulcerations, no polydipsia, no polyphagia, no polyuria, no visual change, no weakness and no weight loss. There are no hypoglycemic complications. Symptoms are stable. Pertinent negatives for diabetic complications include no autonomic neuropathy, CVA, heart disease, impotence, nephropathy, peripheral neuropathy, PVD or retinopathy. Current diabetic treatment includes oral agent (dual therapy). She is compliant with treatment all of the time. Her weight is stable. She is following a generally healthy diet. Meal planning includes avoidance of concentrated sweets and carbohydrate counting. Her home blood glucose trend is fluctuating minimally. Her breakfast blood glucose is taken between 8-9 am. Her breakfast blood glucose range is generally 110-130 mg/dl. An ACE inhibitor/angiotensin II receptor blocker is being taken. She does not see a podiatrist.Eye exam is current.  Anxiety  Presents for initial visit. Onset was more than 5 years ago. The problem has been unchanged. Symptoms include excessive worry and nervous/anxious behavior. Patient reports no chest pain, compulsions, confusion, decreased concentration, depressed mood, dizziness, dry mouth, feeling of choking, hyperventilation, impotence, insomnia, irritability, malaise, muscle tension, nausea, obsessions, palpitations, panic, restlessness, shortness of breath or suicidal ideas. Symptoms occur occasionally. The severity of symptoms is moderate. Nothing aggravates the symptoms. The quality of sleep is good. Nighttime awakenings: none.   There are no known risk factors. Her past medical history is significant for anxiety/panic attacks.  Past treatments include benzodiazephines. The treatment provided mild relief. Compliance with prior treatments has been variable.  Hyperlipidemia  This is a  chronic problem. The current episode started more than 1 year ago. The problem is controlled. Recent lipid tests were reviewed and are normal. She has no history of chronic renal disease, diabetes, hypothyroidism, liver disease, obesity or nephrotic syndrome. There are no known factors aggravating her hyperlipidemia. Pertinent negatives include no chest pain, focal sensory loss, focal weakness, leg pain, myalgias or shortness of breath. Current antihyperlipidemic treatment includes statins. The current treatment provides moderate improvement of lipids. There are no compliance problems.  Risk factors for coronary artery disease include dyslipidemia and hypertension.  Chest Pain   This is a new problem. The current episode started in the past 7 days. The onset quality is sudden. The problem occurs every several days. Progression since onset: new onset. The pain is present in the substernal region. The pain is mild. The quality of the pain is described as pressure. The pain does not radiate. Pertinent negatives include no abdominal pain, back pain, cough, dizziness, exertional chest pressure, fever, headaches, leg pain, malaise/fatigue, nausea, near-syncope, orthopnea, palpitations, PND, shortness of breath, sputum production or weakness.  Her past medical history is significant for anxiety/panic attacks, hyperlipidemia and hypertension.  Pertinent negatives for past medical history include no diabetes, no heart disease, no muscle weakness and no PVD.    Gastro-esophageal reflux disease without esophagitis Continue meds as prescribed  Hyperlipidemia Continue meds as prescribed  Diabetes mellitus with no complication Continue meds as prescribed   Past Medical History:  Diagnosis Date  . Diabetes mellitus without complication ([place])   . GERD  (gastroesophageal reflux disease)   . Hyperlipidemia   . Hypertension     Past Surgical History:  Procedure Laterality Date  . bladder tack    . BREAST BIOPSY Left 10-15 yrs ago   benign  . CATARACT EXTRACTION Bilateral 05/2015  . CHOLECYSTECTOMY OPEN    . VAGINAL HYSTERECTOMY      Family History  Problem Relation Age of Onset  . Heart disease Maternal Aunt   . Heart disease Maternal Grandmother   . Stroke Father   . Breast cancer Neg Hx     Social History   Socioeconomic History  . Marital status: Married    Spouse name: Not on file  . Number of children: 2  . Years of education: Not on file  . Highest education level: Bachelor's degree (e.g., BA, AB, BS)  Occupational History    Employer: RETIRED  Social Needs  . Financial resource strain: Not hard at all  . Food insecurity:    Worry: Never true    Inability: Never true  . Transportation needs:    Medical: No    Non-medical: No  Tobacco Use  . Smoking status: Former Smoker    Packs/day: 0.25    Years: 25.00    Pack years: 6.25    Types: Cigarettes    Last attempt to quit: 1982    Years since quitting: 37.4  . Smokeless tobacco: Never Used  . Tobacco comment: smoking cessation materials not required  Substance and Sexual Activity  . Alcohol use: No    Alcohol/week: 0.0 oz  . Drug use: No  . Sexual activity: Not Currently  Lifestyle  . Physical activity:    Days per week: 0 days    Minutes per session: 0 min  . Stress: Not at all  Relationships  . Social connections:    Talks on phone: Patient refused    Gets together: Patient refused    Attends religious service: Patient refused  Active member of club or organization: Patient refused    Attends meetings of clubs or organizations: Patient refused    Relationship status: Married  . Intimate partner violence:    Fear of current or ex partner: No    Emotionally abused: No    Physically abused: No    Forced sexual activity: No  Other Topics  Concern  . Not on file  Social History Narrative  . Not on file    No Known Allergies  Outpatient Medications Prior to Visit  Medication Sig Dispense Refill  . ADVOCATE LANCETS MISC use once daily as directed to test blood sugar 100 each 0  . Blood Glucose Monitoring Suppl (FREESTYLE LITE) DEVI use once daily as directed to test blood sugar 1 each 0  . Cholecalciferol (VITAMIN D) 2000 units CAPS Take 1 capsule by mouth daily. otc    . FREESTYLE LITE test strip USE 1 STRIP TO CHECK GLUCOSE ONCE DAILY AS DIRECTED 100 each 0  . Lancet Devices (ADVOCATE LANCING DEVICE) MISC use once daily as directed to test blood sugar 1 each 0  . lansoprazole (PREVACID) 15 MG capsule Take 1 capsule (15 mg total) by mouth 1 day or 1 dose. 90 capsule 1  . lisinopril-hydrochlorothiazide (PRINZIDE,ZESTORETIC) 20-12.5 MG tablet Take 1 tablet by mouth daily. Dr Holley Raring    . LORazepam (ATIVAN) 0.5 MG tablet Take 0.5 mg by mouth as needed.    . metFORMIN (GLUCOPHAGE) 500 MG tablet Take 1 tablet (500 mg total) by mouth 2 (two) times daily. 180 tablet 1  . simvastatin (ZOCOR) 20 MG tablet Take 1 tablet (20 mg total) by mouth daily at 6 PM. 90 tablet 1  . sitaGLIPtin (JANUVIA) 100 MG tablet Take 1 tablet (100 mg total) by mouth daily. 90 tablet 0  . colchicine 0.6 MG tablet Take 1 tablet (0.6 mg total) by mouth daily. 10 tablet 0   No facility-administered medications prior to visit.     Review of Systems  Constitutional: Negative for chills, fatigue, fever, irritability, malaise/fatigue and weight loss.  HENT: Negative for ear discharge, ear pain, hoarse voice and sore throat.   Eyes: Negative for blurred vision.  Respiratory: Negative for cough, sputum production, choking, shortness of breath and wheezing.   Cardiovascular: Negative for chest pain, palpitations, orthopnea, leg swelling, PND and near-syncope.  Gastrointestinal: Negative for abdominal pain, blood in stool, constipation, diarrhea, dysphagia,  heartburn, melena and nausea.  Genitourinary: Negative for dysuria, frequency, hematuria, impotence and urgency.  Musculoskeletal: Negative for back pain, joint pain, myalgias, muscle weakness and neck pain.  Skin: Negative for rash.  Neurological: Negative for dizziness, tingling, sensory change, focal weakness, weakness and headaches.  Endo/Heme/Allergies: Negative for environmental allergies, polydipsia and polyphagia. Does not bruise/bleed easily.  Psychiatric/Behavioral: Negative for confusion, decreased concentration, depression and suicidal ideas. The patient is nervous/anxious. The patient does not have insomnia.      Objective  Vitals:   03/02/18 0910  BP: 130/82  Pulse: 84  Weight: 149 lb (67.6 kg)  Height: 5\' 5"  (1.651 m)    Physical Exam  Constitutional: She is oriented to person, place, and time. She appears well-developed and well-nourished.  HENT:  Head: Normocephalic.  Right Ear: External ear normal.  Left Ear: External ear normal.  Mouth/Throat: Oropharynx is clear and moist.  Eyes: Pupils are equal, round, and reactive to light. Conjunctivae and EOM are normal. Lids are everted and swept, no foreign bodies found. Left eye exhibits no hordeolum. No foreign body present  in the left eye. Right conjunctiva is not injected. Left conjunctiva is not injected. No scleral icterus.  Neck: Normal range of motion. Neck supple. No JVD present. No tracheal deviation present. No thyromegaly present.  Cardiovascular: Normal rate, regular rhythm, normal heart sounds and intact distal pulses. Exam reveals no gallop and no friction rub.  No murmur heard. Pulmonary/Chest: Effort normal and breath sounds normal. No respiratory distress. She has no wheezes. She has no rales.  Abdominal: Soft. Bowel sounds are normal. She exhibits no mass. There is no hepatosplenomegaly. There is no tenderness. There is no rebound and no guarding.  Musculoskeletal: Normal range of motion. She exhibits no  edema or tenderness.  Lymphadenopathy:    She has no cervical adenopathy.  Neurological: She is alert and oriented to person, place, and time. She has normal strength. She displays normal reflexes. No cranial nerve deficit.  Skin: Skin is warm. No rash noted.  Psychiatric: She has a normal mood and affect. Her mood appears not anxious. She does not exhibit a depressed mood.  Nursing note and vitals reviewed.     Assessment & Plan  Problem List Items Addressed This Visit      Cardiovascular and Mediastinum   Hypertension   Relevant Medications   simvastatin (ZOCOR) 20 MG tablet   lisinopril-hydrochlorothiazide (PRINZIDE,ZESTORETIC) 20-12.5 MG tablet   Other Relevant Orders   Renal Function Panel     Digestive   Gastro-esophageal reflux disease without esophagitis    Continue meds as prescribed      Relevant Medications   lansoprazole (PREVACID) 15 MG capsule     Endocrine   Diabetes mellitus with no complication ([facility])    Continue meds as prescribed      Relevant Medications   sitaGLIPtin (JANUVIA) 100 MG tablet   metFORMIN (GLUCOPHAGE) 500 MG tablet   simvastatin (ZOCOR) 20 MG tablet   lisinopril-hydrochlorothiazide (PRINZIDE,ZESTORETIC) 20-12.5 MG tablet   Other Relevant Orders   Renal Function Panel   Hemoglobin A1c     Other   Hyperlipidemia    Continue meds as prescribed      Relevant Medications   simvastatin (ZOCOR) 20 MG tablet   lisinopril-hydrochlorothiazide (PRINZIDE,ZESTORETIC) 20-12.5 MG tablet   Other Relevant Orders   Lipid panel    Other Visit Diagnoses    Chest pain, unspecified type    -  Primary   EKG normal/ send to cardio for further eval/ poss stress test   Relevant Orders   EKG 12-Lead (Completed)   Ambulatory referral to Cardiology   Anxiety       continue Ativan as needed for anxiety/ church functions   Relevant Medications   LORazepam (ATIVAN) 0.5 MG tablet      Meds ordered this encounter  Medications  . sitaGLIPtin  (JANUVIA) 100 MG tablet    Sig: Take 1 tablet (100 mg total) by mouth daily.    Dispense:  90 tablet    Refill:  0  . metFORMIN (GLUCOPHAGE) 500 MG tablet    Sig: Take 1 tablet (500 mg total) by mouth 2 (two) times daily.    Dispense:  180 tablet    Refill:  1  . lansoprazole (PREVACID) 15 MG capsule    Sig: Take 1 capsule (15 mg total) by mouth 1 day or 1 dose.    Dispense:  90 capsule    Refill:  1  . simvastatin (ZOCOR) 20 MG tablet    Sig: Take 1 tablet (20 mg total) by mouth daily at  6 PM.    Dispense:  90 tablet    Refill:  1  . LORazepam (ATIVAN) 0.5 MG tablet    Sig: Take 1 tablet (0.5 mg total) by mouth as needed.    Dispense:  30 tablet    Refill:  5  . lisinopril-hydrochlorothiazide (PRINZIDE,ZESTORETIC) 20-12.5 MG tablet    Sig: Take 1 tablet by mouth daily. Dr Holley Raring    Dispense:  90 tablet    Refill:  1      Dr. [facility]  03/02/18

## 2018-03-02 NOTE — Assessment & Plan Note (Signed)
Continue meds as prescribed.

## 2018-03-03 LAB — RENAL FUNCTION PANEL
ALBUMIN: 4.4 g/dL (ref 3.5–4.7)
BUN / CREAT RATIO: 13 (ref 12–28)
BUN: 19 mg/dL (ref 8–27)
CALCIUM: 10 mg/dL (ref 8.7–10.3)
CHLORIDE: 104 mmol/L (ref 96–106)
CO2: 22 mmol/L (ref 20–29)
Creatinine, Ser: 1.52 mg/dL — ABNORMAL HIGH (ref 0.57–1.00)
GFR calc non Af Amer: 31 mL/min/{1.73_m2} — ABNORMAL LOW (ref >59)
GFR, EST AFRICAN AMERICAN: 36 mL/min/{1.73_m2} — AB (ref >59)
GLUCOSE: 121 mg/dL — AB (ref 65–99)
POTASSIUM: 4.3 mmol/L (ref 3.5–5.2)
Phosphorus: 3.2 mg/dL (ref 2.5–4.5)
Sodium: 148 mmol/L — ABNORMAL HIGH (ref 134–144)

## 2018-03-03 LAB — LIPID PANEL
CHOL/HDL RATIO: 2.8 ratio (ref 0.0–4.4)
CHOLESTEROL TOTAL: 169 mg/dL (ref 100–199)
HDL: 61 mg/dL (ref >39)
LDL CALC: 81 mg/dL (ref 0–99)
Triglycerides: 136 mg/dL (ref 0–149)
VLDL Cholesterol Cal: 27 mg/dL (ref 5–40)

## 2018-03-03 LAB — HEMOGLOBIN A1C
Est. average glucose Bld gHb Est-mCnc: 146 mg/dL
HEMOGLOBIN A1C: 6.7 % — AB (ref 4.8–5.6)

## 2018-03-08 DIAGNOSIS — I208 Other forms of angina pectoris: Secondary | ICD-10-CM | POA: Diagnosis not present

## 2018-03-08 DIAGNOSIS — F419 Anxiety disorder, unspecified: Secondary | ICD-10-CM | POA: Diagnosis not present

## 2018-03-08 DIAGNOSIS — I10 Essential (primary) hypertension: Secondary | ICD-10-CM | POA: Diagnosis not present

## 2018-03-08 DIAGNOSIS — R011 Cardiac murmur, unspecified: Secondary | ICD-10-CM | POA: Diagnosis not present

## 2018-03-08 DIAGNOSIS — E782 Mixed hyperlipidemia: Secondary | ICD-10-CM | POA: Diagnosis not present

## 2018-03-08 DIAGNOSIS — K219 Gastro-esophageal reflux disease without esophagitis: Secondary | ICD-10-CM | POA: Diagnosis not present

## 2018-03-08 DIAGNOSIS — E119 Type 2 diabetes mellitus without complications: Secondary | ICD-10-CM | POA: Diagnosis not present

## 2018-03-08 DIAGNOSIS — Z794 Long term (current) use of insulin: Secondary | ICD-10-CM | POA: Diagnosis not present

## 2018-03-28 DIAGNOSIS — R011 Cardiac murmur, unspecified: Secondary | ICD-10-CM | POA: Diagnosis not present

## 2018-03-28 DIAGNOSIS — I208 Other forms of angina pectoris: Secondary | ICD-10-CM | POA: Diagnosis not present

## 2018-04-04 DIAGNOSIS — I10 Essential (primary) hypertension: Secondary | ICD-10-CM | POA: Diagnosis not present

## 2018-04-04 DIAGNOSIS — E119 Type 2 diabetes mellitus without complications: Secondary | ICD-10-CM | POA: Diagnosis not present

## 2018-04-04 DIAGNOSIS — R011 Cardiac murmur, unspecified: Secondary | ICD-10-CM | POA: Diagnosis not present

## 2018-04-04 DIAGNOSIS — Z794 Long term (current) use of insulin: Secondary | ICD-10-CM | POA: Diagnosis not present

## 2018-04-04 DIAGNOSIS — F419 Anxiety disorder, unspecified: Secondary | ICD-10-CM | POA: Diagnosis not present

## 2018-04-04 DIAGNOSIS — K219 Gastro-esophageal reflux disease without esophagitis: Secondary | ICD-10-CM | POA: Diagnosis not present

## 2018-04-04 DIAGNOSIS — I208 Other forms of angina pectoris: Secondary | ICD-10-CM | POA: Diagnosis not present

## 2018-04-04 DIAGNOSIS — E782 Mixed hyperlipidemia: Secondary | ICD-10-CM | POA: Diagnosis not present

## 2018-04-25 DIAGNOSIS — N2581 Secondary hyperparathyroidism of renal origin: Secondary | ICD-10-CM | POA: Diagnosis not present

## 2018-04-25 DIAGNOSIS — N183 Chronic kidney disease, stage 3 (moderate): Secondary | ICD-10-CM | POA: Diagnosis not present

## 2018-04-25 DIAGNOSIS — E119 Type 2 diabetes mellitus without complications: Secondary | ICD-10-CM | POA: Diagnosis not present

## 2018-04-25 DIAGNOSIS — I10 Essential (primary) hypertension: Secondary | ICD-10-CM | POA: Diagnosis not present

## 2018-07-18 ENCOUNTER — Ambulatory Visit (INDEPENDENT_AMBULATORY_CARE_PROVIDER_SITE_OTHER): Payer: PPO | Admitting: Family Medicine

## 2018-07-18 ENCOUNTER — Encounter: Payer: Self-pay | Admitting: Family Medicine

## 2018-07-18 VITALS — BP 138/80 | HR 72 | Ht 65.0 in | Wt 147.0 lb

## 2018-07-18 DIAGNOSIS — M10071 Idiopathic gout, right ankle and foot: Secondary | ICD-10-CM | POA: Diagnosis not present

## 2018-07-18 DIAGNOSIS — M79674 Pain in right toe(s): Secondary | ICD-10-CM

## 2018-07-18 DIAGNOSIS — M79672 Pain in left foot: Secondary | ICD-10-CM | POA: Diagnosis not present

## 2018-07-18 DIAGNOSIS — M21961 Unspecified acquired deformity of right lower leg: Secondary | ICD-10-CM

## 2018-07-18 NOTE — Progress Notes (Addendum)
Date:  07/18/2018   Name:  Jordan Thornton   DOB:  1930/02/19   MRN:  [id number]   Chief Complaint: Toe Pain (stumped toe on a wooden chest last week. Middle toe on R) foot- red, swollen, has a knot on the top of toe, and is painful) Toe Pain   The incident occurred 5 to 7 days ago. The incident occurred at home. The injury mechanism was a direct blow (stumped on chest of drawer). The pain is present in the right toes. The quality of the pain is described as aching. The pain is at a severity of 3/10. The pain is moderate. Pertinent negatives include no numbness. She reports no foreign bodies present. The symptoms are aggravated by movement. The treatment provided moderate relief.     Review of Systems  Constitutional: Negative.  Negative for chills, fatigue, fever and unexpected weight change.  HENT: Negative for congestion, ear discharge, ear pain, rhinorrhea, sinus pressure, sneezing and sore throat.   Eyes: Negative for photophobia, pain, discharge, redness and itching.  Respiratory: Negative for cough, shortness of breath, wheezing and stridor.   Gastrointestinal: Negative for abdominal pain, blood in stool, constipation, diarrhea, nausea and vomiting.  Endocrine: Negative for cold intolerance, heat intolerance, polydipsia, polyphagia and polyuria.  Genitourinary: Negative for dysuria, flank pain, frequency, hematuria, menstrual problem, pelvic pain, urgency, vaginal bleeding and vaginal discharge.  Musculoskeletal: Negative for arthralgias, back pain and myalgias.  Skin: Negative for rash.  Allergic/Immunologic: Negative for environmental allergies and food allergies.  Neurological: Negative for dizziness, weakness, light-headedness, numbness and headaches.  Hematological: Negative for adenopathy. Does not bruise/bleed easily.  Psychiatric/Behavioral: Negative for dysphoric mood. The patient is not nervous/anxious.     Patient Active Problem List   Diagnosis Date Noted  .  Diabetes mellitus with no complication ([place]) 27/25/3664  . Familial multiple lipoprotein-type hyperlipidemia 01/03/2015  . Creatinine elevation 01/03/2015  . Diabetes ([place]) 12/15/2014  . Hyperlipidemia 12/15/2014  . Hypertension 12/15/2014  . Gastro-esophageal reflux disease without esophagitis 12/15/2014    No Known Allergies  Past Surgical History:  Procedure Laterality Date  . bladder tack    . BREAST BIOPSY Left 10-15 yrs ago   benign  . CATARACT EXTRACTION Bilateral 05/2015  . CHOLECYSTECTOMY OPEN    . VAGINAL HYSTERECTOMY      Social History   Tobacco Use  . Smoking status: Former Smoker    Packs/day: 0.25    Years: 25.00    Pack years: 6.25    Types: Cigarettes    Last attempt to quit: 1982    Years since quitting: 37.8  . Smokeless tobacco: Never Used  . Tobacco comment: smoking cessation materials not required  Substance Use Topics  . Alcohol use: No    Alcohol/week: 0.0 standard drinks  . Drug use: No     Medication list has been reviewed and updated.  Current Meds  Medication Sig  . ADVOCATE LANCETS MISC use once daily as directed to test blood sugar  . Blood Glucose Monitoring Suppl (FREESTYLE LITE) DEVI use once daily as directed to test blood sugar  . Cholecalciferol (VITAMIN D) 2000 units CAPS Take 1 capsule by mouth daily. otc  . FREESTYLE LITE test strip USE 1 STRIP TO CHECK GLUCOSE ONCE DAILY AS DIRECTED  . Lancet Devices (ADVOCATE LANCING DEVICE) MISC use once daily as directed to test blood sugar  . lansoprazole (PREVACID) 15 MG capsule Take 1 capsule (15 mg total) by mouth 1 day or 1 dose.  Marland Kitchen  lisinopril-hydrochlorothiazide (PRINZIDE,ZESTORETIC) 20-12.5 MG tablet Take 1 tablet by mouth daily. Dr Holley Raring  . LORazepam (ATIVAN) 0.5 MG tablet Take 1 tablet (0.5 mg total) by mouth as needed.  . metFORMIN (GLUCOPHAGE) 500 MG tablet Take 1 tablet (500 mg total) by mouth 2 (two) times daily.  . simvastatin (ZOCOR) 20 MG tablet Take 1 tablet (20 mg  total) by mouth daily at 6 PM.  . sitaGLIPtin (JANUVIA) 100 MG tablet Take 1 tablet (100 mg total) by mouth daily.    PHQ 2/9 Scores 02/09/2018 02/25/2017 02/19/2016 08/21/2015  PHQ - 2 Score 0 0 0 0  PHQ- 9 Score 0 - - -    Physical Exam  Constitutional: No distress.  HENT:  Head: Normocephalic and atraumatic.  Right Ear: External ear normal.  Left Ear: External ear normal.  Nose: Nose normal.  Mouth/Throat: Oropharynx is clear and moist.  Eyes: Pupils are equal, round, and reactive to light. Conjunctivae and EOM are normal. Right eye exhibits no discharge. Left eye exhibits no discharge.  Neck: Normal range of motion. Neck supple. No JVD present. No thyromegaly present.  Cardiovascular: Normal rate, regular rhythm, normal heart sounds and intact distal pulses. Exam reveals no gallop and no friction rub.  No murmur heard. Pulmonary/Chest: Effort normal and breath sounds normal.  Abdominal: Soft. Bowel sounds are normal. She exhibits no mass. There is no tenderness. There is no guarding.  Musculoskeletal: Normal range of motion. She exhibits no edema.       Right foot: There is tenderness, swelling and deformity.       Feet:  Lymphadenopathy:    She has no cervical adenopathy.  Neurological: She is alert. She has normal reflexes.  Skin: Skin is warm and dry. She is not diaphoretic.  Nursing note and vitals reviewed.   BP 138/80   Pulse 72   Ht 5\' 5"  (1.651 m)   Wt 147 lb (66.7 kg)   BMI 24.46 kg/m   Assessment and Plan:  There are no diagnoses linked to this encounter.   Dr. [facility]  07/18/2018

## 2018-07-18 NOTE — Progress Notes (Signed)
    Date:  07/18/2018   Name:  Jordan Thornton   DOB:  12-Dec-1929   MRN:  [id number]   Chief Complaint: Toe Pain (stumped toe on a wooden chest last week. Middle toe on R) foot- red, swollen, has a knot on the top of toe, and is painful) .hpi   Review of Systems  Patient Active Problem List   Diagnosis Date Noted  . Diabetes mellitus with no complication ([place]) 01/60/1093  . Familial multiple lipoprotein-type hyperlipidemia 01/03/2015  . Creatinine elevation 01/03/2015  . Diabetes ([place]) 12/15/2014  . Hyperlipidemia 12/15/2014  . Hypertension 12/15/2014  . Gastro-esophageal reflux disease without esophagitis 12/15/2014    No Known Allergies  Past Surgical History:  Procedure Laterality Date  . bladder tack    . BREAST BIOPSY Left 10-15 yrs ago   benign  . CATARACT EXTRACTION Bilateral 05/2015  . CHOLECYSTECTOMY OPEN    . VAGINAL HYSTERECTOMY      Social History   Tobacco Use  . Smoking status: Former Smoker    Packs/day: 0.25    Years: 25.00    Pack years: 6.25    Types: Cigarettes    Last attempt to quit: 1982    Years since quitting: 37.8  . Smokeless tobacco: Never Used  . Tobacco comment: smoking cessation materials not required  Substance Use Topics  . Alcohol use: No    Alcohol/week: 0.0 standard drinks  . Drug use: No     Medication list has been reviewed and updated.  Current Meds  Medication Sig  . ADVOCATE LANCETS MISC use once daily as directed to test blood sugar  . Blood Glucose Monitoring Suppl (FREESTYLE LITE) DEVI use once daily as directed to test blood sugar  . Cholecalciferol (VITAMIN D) 2000 units CAPS Take 1 capsule by mouth daily. otc  . FREESTYLE LITE test strip USE 1 STRIP TO CHECK GLUCOSE ONCE DAILY AS DIRECTED  . Lancet Devices (ADVOCATE LANCING DEVICE) MISC use once daily as directed to test blood sugar  . lansoprazole (PREVACID) 15 MG capsule Take 1 capsule (15 mg total) by mouth 1 day or 1 dose.  Marland Kitchen  lisinopril-hydrochlorothiazide (PRINZIDE,ZESTORETIC) 20-12.5 MG tablet Take 1 tablet by mouth daily. Dr Holley Raring  . LORazepam (ATIVAN) 0.5 MG tablet Take 1 tablet (0.5 mg total) by mouth as needed.  . metFORMIN (GLUCOPHAGE) 500 MG tablet Take 1 tablet (500 mg total) by mouth 2 (two) times daily.  . simvastatin (ZOCOR) 20 MG tablet Take 1 tablet (20 mg total) by mouth daily at 6 PM.  . sitaGLIPtin (JANUVIA) 100 MG tablet Take 1 tablet (100 mg total) by mouth daily.    PHQ 2/9 Scores 02/09/2018 02/25/2017 02/19/2016 08/21/2015  PHQ - 2 Score 0 0 0 0  PHQ- 9 Score 0 - - -    Physical Exam  BP 138/80   Pulse 72   Ht 5\' 5"  (1.651 m)   Wt 147 lb (66.7 kg)   BMI 24.46 kg/m   Assessment and Plan:  1. Toe pain, right Pain to toe after striking on furniture. Mild tenderness with dorsal swelling. Hx og gout Abscess vs tophi - Ambulatory referral to Podiatry  2. Foot deformity, acquired, right Hx of injury. Abnormal walking with formation of bunion. - Ambulatory referral to Podiatry   Dr. [facility]  07/18/2018

## 2018-07-19 DIAGNOSIS — H524 Presbyopia: Secondary | ICD-10-CM | POA: Diagnosis not present

## 2018-07-19 DIAGNOSIS — Z9841 Cataract extraction status, right eye: Secondary | ICD-10-CM | POA: Diagnosis not present

## 2018-07-19 DIAGNOSIS — E119 Type 2 diabetes mellitus without complications: Secondary | ICD-10-CM | POA: Diagnosis not present

## 2018-07-19 DIAGNOSIS — Z9842 Cataract extraction status, left eye: Secondary | ICD-10-CM | POA: Diagnosis not present

## 2018-07-19 LAB — HM DIABETES EYE EXAM

## 2018-07-25 DIAGNOSIS — M10071 Idiopathic gout, right ankle and foot: Secondary | ICD-10-CM | POA: Diagnosis not present

## 2018-07-25 DIAGNOSIS — M76821 Posterior tibial tendinitis, right leg: Secondary | ICD-10-CM | POA: Diagnosis not present

## 2018-07-26 ENCOUNTER — Other Ambulatory Visit: Payer: Self-pay | Admitting: Family Medicine

## 2018-07-26 ENCOUNTER — Other Ambulatory Visit: Payer: Self-pay

## 2018-07-26 DIAGNOSIS — Z1231 Encounter for screening mammogram for malignant neoplasm of breast: Secondary | ICD-10-CM

## 2018-08-13 ENCOUNTER — Other Ambulatory Visit: Payer: Self-pay | Admitting: Family Medicine

## 2018-08-13 DIAGNOSIS — E119 Type 2 diabetes mellitus without complications: Secondary | ICD-10-CM

## 2018-08-25 DIAGNOSIS — I10 Essential (primary) hypertension: Secondary | ICD-10-CM | POA: Diagnosis not present

## 2018-08-25 DIAGNOSIS — N183 Chronic kidney disease, stage 3 (moderate): Secondary | ICD-10-CM | POA: Diagnosis not present

## 2018-08-25 DIAGNOSIS — E119 Type 2 diabetes mellitus without complications: Secondary | ICD-10-CM | POA: Diagnosis not present

## 2018-08-25 DIAGNOSIS — N2581 Secondary hyperparathyroidism of renal origin: Secondary | ICD-10-CM | POA: Diagnosis not present

## 2018-08-29 ENCOUNTER — Ambulatory Visit
Admission: RE | Admit: 2018-08-29 | Discharge: 2018-08-29 | Disposition: A | Discharge status: Home / Self Care | Payer: PPO | Source: Ambulatory Visit | Attending: Family Medicine | Admitting: Family Medicine

## 2018-08-29 DIAGNOSIS — Z1231 Encounter for screening mammogram for malignant neoplasm of breast: Secondary | ICD-10-CM | POA: Diagnosis not present

## 2018-09-02 ENCOUNTER — Ambulatory Visit (INDEPENDENT_AMBULATORY_CARE_PROVIDER_SITE_OTHER): Payer: PPO | Admitting: Family Medicine

## 2018-09-02 ENCOUNTER — Encounter: Payer: Self-pay | Admitting: Family Medicine

## 2018-09-02 VITALS — BP 120/70 | HR 72 | Ht 65.0 in | Wt 148.0 lb

## 2018-09-02 DIAGNOSIS — E782 Mixed hyperlipidemia: Secondary | ICD-10-CM | POA: Diagnosis not present

## 2018-09-02 DIAGNOSIS — I10 Essential (primary) hypertension: Secondary | ICD-10-CM | POA: Diagnosis not present

## 2018-09-02 DIAGNOSIS — F419 Anxiety disorder, unspecified: Secondary | ICD-10-CM

## 2018-09-02 DIAGNOSIS — R69 Illness, unspecified: Secondary | ICD-10-CM | POA: Diagnosis not present

## 2018-09-02 DIAGNOSIS — K219 Gastro-esophageal reflux disease without esophagitis: Secondary | ICD-10-CM

## 2018-09-02 DIAGNOSIS — E119 Type 2 diabetes mellitus without complications: Secondary | ICD-10-CM

## 2018-09-02 MED ORDER — LISINOPRIL-HYDROCHLOROTHIAZIDE 20-12.5 MG PO TABS: 1.0000 | ORAL_TABLET | Freq: Every day | ORAL | 1 refills | Status: DC

## 2018-09-02 MED ORDER — LANSOPRAZOLE 15 MG PO CPDR: 15.0000 mg | DELAYED_RELEASE_CAPSULE | ORAL | 1 refills | Status: DC

## 2018-09-02 MED ORDER — SIMVASTATIN 20 MG PO TABS: 20.0000 mg | ORAL_TABLET | Freq: Every day | ORAL | 1 refills | Status: DC

## 2018-09-02 MED ORDER — SITAGLIPTIN PHOSPHATE 100 MG PO TABS: 100.0000 mg | ORAL_TABLET | Freq: Every day | ORAL | 1 refills | Status: DC

## 2018-09-02 MED ORDER — METFORMIN HCL 500 MG PO TABS: 500.0000 mg | ORAL_TABLET | Freq: Two times a day (BID) | ORAL | 1 refills | Status: DC

## 2018-09-02 MED ORDER — LORAZEPAM 0.5 MG PO TABS: 0.5000 mg | ORAL_TABLET | ORAL | 5 refills | Status: DC | PRN

## 2018-09-02 NOTE — Progress Notes (Signed)
Date:  09/02/2018   Name:  Jordan Thornton   DOB:  [birth year]   MRN:  [id number]   Chief Complaint: Diabetes; Hyperlipidemia; Hypertension; and Gastroesophageal Reflux  Diabetes  She presents for her follow-up diabetic visit. She has type 2 diabetes mellitus. Her disease course has been stable. There are no hypoglycemic associated symptoms. Pertinent negatives for hypoglycemia include no confusion, dizziness, headaches, hunger, mood changes, nervousness/anxiousness, pallor, sleepiness, speech difficulty, sweats or tremors. Pertinent negatives for diabetes include no blurred vision, no chest pain, no fatigue, no foot paresthesias, no foot ulcerations, no polydipsia, no polyphagia, no polyuria, no visual change, no weakness and no weight loss. There are no hypoglycemic complications. Symptoms are stable. There are no diabetic complications. Pertinent negatives for diabetic complications include no CVA, PVD or retinopathy. Risk factors for coronary artery disease include dyslipidemia. Current diabetic treatment includes oral agent (monotherapy). She is following a generally healthy diet. There is no change in her home blood glucose trend. Her breakfast blood glucose is taken between 8-9 am. Her breakfast blood glucose range is generally 110-130 mg/dl. An ACE inhibitor/angiotensin II receptor blocker is being taken. Eye exam is current.  Hyperlipidemia  This is a chronic problem. The current episode started more than 1 year ago. The problem is controlled. Recent lipid tests were reviewed and are normal. She has no history of chronic renal disease, diabetes, hypothyroidism, liver disease, obesity or nephrotic syndrome. There are no known factors aggravating her hyperlipidemia. Pertinent negatives include no chest pain, focal sensory loss, focal weakness, leg pain, myalgias or shortness of breath. Current antihyperlipidemic treatment includes statins. The current treatment provides moderate improvement  of lipids. There are no compliance problems.  There are no known risk factors for coronary artery disease.  Hypertension  This is a chronic problem. The current episode started yesterday. The problem has been gradually improving since onset. The problem is controlled. Pertinent negatives include no anxiety, blurred vision, chest pain, headaches, malaise/fatigue, neck pain, orthopnea, palpitations, PND, shortness of breath or sweats. There are no associated agents to hypertension. Risk factors for coronary artery disease include diabetes mellitus, dyslipidemia and post-menopausal state. Past treatments include ACE inhibitors. The current treatment provides mild improvement. There are no compliance problems.  There is no history of angina, kidney disease, CAD/MI, CVA, heart failure, left ventricular hypertrophy, PVD or retinopathy. There is no history of chronic renal disease, a hypertension causing med or renovascular disease.  Gastroesophageal Reflux  She reports no abdominal pain, no belching, no chest pain, no choking, no coughing, no dysphagia, no early satiety, no globus sensation, no heartburn, no hoarse voice, no nausea, no sore throat, no stridor, no tooth decay, no water brash or no wheezing. This is a chronic problem. The current episode started more than 1 year ago. The problem occurs occasionally. The symptoms are aggravated by certain foods. Pertinent negatives include no anemia, fatigue, melena, muscle weakness, orthopnea or weight loss.    Review of Systems  Constitutional: Negative.  Negative for chills, fatigue, fever, malaise/fatigue, unexpected weight change and weight loss.  HENT: Negative for congestion, ear discharge, ear pain, hoarse voice, rhinorrhea, sinus pressure, sneezing and sore throat.   Eyes: Negative for blurred vision, photophobia, pain, discharge, redness and itching.  Respiratory: Negative for cough, choking, shortness of breath, wheezing and stridor.   Cardiovascular:  Negative for chest pain, palpitations, orthopnea and PND.  Gastrointestinal: Negative for abdominal pain, blood in stool, constipation, diarrhea, dysphagia, heartburn, melena, nausea and vomiting.  Endocrine: Negative  for cold intolerance, heat intolerance, polydipsia, polyphagia and polyuria.  Genitourinary: Negative for dysuria, flank pain, frequency, hematuria, menstrual problem, pelvic pain, urgency, vaginal bleeding and vaginal discharge.  Musculoskeletal: Negative for arthralgias, back pain, myalgias, muscle weakness and neck pain.  Skin: Negative for pallor and rash.  Allergic/Immunologic: Negative for environmental allergies and food allergies.  Neurological: Negative for dizziness, tremors, focal weakness, speech difficulty, weakness, light-headedness, numbness and headaches.  Hematological: Negative for adenopathy. Does not bruise/bleed easily.  Psychiatric/Behavioral: Negative for confusion and dysphoric mood. The patient is not nervous/anxious.     Patient Active Problem List   Diagnosis Date Noted  . Diabetes mellitus with no complication ([place]) 86/76/1950  . Familial multiple lipoprotein-type hyperlipidemia 01/03/2015  . Creatinine elevation 01/03/2015  . Diabetes ([place]) 12/15/2014  . Hyperlipidemia 12/15/2014  . Hypertension 12/15/2014  . Gastro-esophageal reflux disease without esophagitis 12/15/2014    No Known Allergies  Past Surgical History:  Procedure Laterality Date  . bladder tack    . BREAST BIOPSY Left 10-15 yrs ago   benign  . CATARACT EXTRACTION Bilateral 05/2015  . CHOLECYSTECTOMY OPEN    . VAGINAL HYSTERECTOMY      Social History   Tobacco Use  . Smoking status: Former Smoker    Packs/day: 0.25    Years: 25.00    Pack years: 6.25    Types: Cigarettes    Last attempt to quit: 1982    Years since quitting: 37.9  . Smokeless tobacco: Never Used  . Tobacco comment: smoking cessation materials not required  Substance Use Topics  . Alcohol use: No      Alcohol/week: 0.0 standard drinks  . Drug use: No     Medication list has been reviewed and updated.  Current Meds  Medication Sig  . ADVOCATE LANCETS MISC use once daily as directed to test blood sugar  . Blood Glucose Monitoring Suppl (FREESTYLE LITE) DEVI use once daily as directed to test blood sugar  . Cholecalciferol (VITAMIN D) 2000 units CAPS Take 1 capsule by mouth daily. otc  . FREESTYLE LITE test strip USE 1 STRIP TO CHECK GLUCOSE ONCE DAILY AS DIRECTED  . JANUVIA 100 MG tablet TAKE 1 TABLET BY MOUTH ONCE DAILY  . Lancet Devices (ADVOCATE LANCING DEVICE) MISC use once daily as directed to test blood sugar  . lansoprazole (PREVACID) 15 MG capsule Take 1 capsule (15 mg total) by mouth 1 day or 1 dose.  Marland Kitchen lisinopril-hydrochlorothiazide (PRINZIDE,ZESTORETIC) 20-12.5 MG tablet Take 1 tablet by mouth daily. Dr Holley Raring  . LORazepam (ATIVAN) 0.5 MG tablet Take 1 tablet (0.5 mg total) by mouth as needed.  . metFORMIN (GLUCOPHAGE) 500 MG tablet Take 1 tablet (500 mg total) by mouth 2 (two) times daily.  . simvastatin (ZOCOR) 20 MG tablet Take 1 tablet (20 mg total) by mouth daily at 6 PM.    PHQ 2/9 Scores 09/02/2018 02/09/2018 02/25/2017 02/19/2016  PHQ - 2 Score 0 0 0 0  PHQ- 9 Score 0 0 - -    Physical Exam Vitals signs and nursing note reviewed.  Constitutional:      General: She is not in acute distress.    Appearance: She is not diaphoretic.  HENT:     Head: Normocephalic and atraumatic.     Right Ear: External ear normal.     Left Ear: External ear normal.     Nose: Nose normal.  Eyes:     General:        Right eye: No discharge.  Left eye: No discharge.     Conjunctiva/sclera: Conjunctivae normal.     Pupils: Pupils are equal, round, and reactive to light.  Neck:     Musculoskeletal: Normal range of motion and neck supple.     Thyroid: No thyromegaly.     Vascular: No JVD.  Cardiovascular:     Rate and Rhythm: Normal rate and regular rhythm.     Heart  sounds: Normal heart sounds. No murmur. No friction rub. No gallop.   Pulmonary:     Effort: Pulmonary effort is normal.     Breath sounds: Normal breath sounds.  Abdominal:     General: Bowel sounds are normal.     Palpations: Abdomen is soft. There is no mass.     Tenderness: There is no abdominal tenderness. There is no guarding.  Musculoskeletal: Normal range of motion.  Lymphadenopathy:     Cervical: No cervical adenopathy.  Skin:    General: Skin is warm and dry.  Neurological:     Mental Status: She is alert.     Deep Tendon Reflexes: Reflexes are normal and symmetric.     BP 120/70   Pulse 72   Ht 5\' 5"  (1.651 m)   Wt 148 lb (67.1 kg)   BMI 24.63 kg/m   Assessment and Plan: 1. Diabetes mellitus with no complication (HCC) Chronic. Controlled. Stable on meds with BS averaging 100. Refill Metformin and Januvia/ gave samples on Januvia for 6 weeks. Obtain and send microalbumin and draw A1C - Microalbumin, urine - HgB A1c - metFORMIN (GLUCOPHAGE) 500 MG tablet; Take 1 tablet (500 mg total) by mouth 2 (two) times daily.  Dispense: 180 tablet; Refill: 1 - sitaGLIPtin (JANUVIA) 100 MG tablet; Take 1 tablet (100 mg total) by mouth daily.  Dispense: 90 tablet; Refill: 1  2. Anxiety Controlled on medication/ takes as needed. Refill Ativan, has been informed not to take unless having anxiety.  - LORazepam (ATIVAN) 0.5 MG tablet; Take 1 tablet (0.5 mg total) by mouth as needed.  Dispense: 30 tablet; Refill: 5  3. Essential hypertension Chronic. Controlled. Stable on med- refill lisinopril/HCTZ - lisinopril-hydrochlorothiazide (PRINZIDE,ZESTORETIC) 20-12.5 MG tablet; Take 1 tablet by mouth daily. Dr Holley Raring  Dispense: 90 tablet; Refill: 1  4. Gastro-esophageal reflux disease without esophagitis Chronic. Controlled. Stable on med- refill lansoprazole - lansoprazole (PREVACID) 15 MG capsule; Take 1 capsule (15 mg total) by mouth 1 day or 1 dose.  Dispense: 90 capsule; Refill:  1  5. Mixed hyperlipidemia Chronic. Controlled- refill simvastatin/ draw lipid panel - Lipid Panel With LDL/HDL Ratio - simvastatin (ZOCOR) 20 MG tablet; Take 1 tablet (20 mg total) by mouth daily at 6 PM.  Dispense: 90 tablet; Refill: 1  6. Taking medication for chronic disease Draw hepatic due to taking statin medication - Hepatic function panel

## 2018-09-03 LAB — LIPID PANEL WITH LDL/HDL RATIO
Cholesterol, Total: 170 mg/dL (ref 100–199)
HDL: 57 mg/dL (ref >39)
LDL CALC: 90 mg/dL (ref 0–99)
LDl/HDL Ratio: 1.6 ratio (ref 0.0–3.2)
Triglycerides: 117 mg/dL (ref 0–149)
VLDL Cholesterol Cal: 23 mg/dL (ref 5–40)

## 2018-09-03 LAB — HEMOGLOBIN A1C
Est. average glucose Bld gHb Est-mCnc: 148 mg/dL
Hgb A1c MFr Bld: 6.8 % — ABNORMAL HIGH (ref 4.8–5.6)

## 2018-09-03 LAB — HEPATIC FUNCTION PANEL
ALT: 10 IU/L (ref 0–32)
AST: 11 IU/L (ref 0–40)
Albumin: 4.1 g/dL (ref 3.5–4.7)
Alkaline Phosphatase: 63 IU/L (ref 39–117)
Bilirubin Total: 0.3 mg/dL (ref 0.0–1.2)
Bilirubin, Direct: 0.13 mg/dL (ref 0.00–0.40)
TOTAL PROTEIN: 6.4 g/dL (ref 6.0–8.5)

## 2018-09-03 LAB — MICROALBUMIN, URINE: Microalbumin, Urine: 12.2 ug/mL

## 2018-10-04 DIAGNOSIS — E782 Mixed hyperlipidemia: Secondary | ICD-10-CM | POA: Diagnosis not present

## 2018-10-04 DIAGNOSIS — F419 Anxiety disorder, unspecified: Secondary | ICD-10-CM | POA: Diagnosis not present

## 2018-10-04 DIAGNOSIS — I10 Essential (primary) hypertension: Secondary | ICD-10-CM | POA: Diagnosis not present

## 2018-10-04 DIAGNOSIS — K219 Gastro-esophageal reflux disease without esophagitis: Secondary | ICD-10-CM | POA: Diagnosis not present

## 2018-10-04 DIAGNOSIS — R011 Cardiac murmur, unspecified: Secondary | ICD-10-CM | POA: Diagnosis not present

## 2018-10-04 DIAGNOSIS — E119 Type 2 diabetes mellitus without complications: Secondary | ICD-10-CM | POA: Diagnosis not present

## 2018-10-04 DIAGNOSIS — Z794 Long term (current) use of insulin: Secondary | ICD-10-CM | POA: Diagnosis not present

## 2018-10-04 DIAGNOSIS — I208 Other forms of angina pectoris: Secondary | ICD-10-CM | POA: Diagnosis not present

## 2018-12-15 DIAGNOSIS — I10 Essential (primary) hypertension: Secondary | ICD-10-CM | POA: Diagnosis not present

## 2018-12-15 DIAGNOSIS — Z1211 Encounter for screening for malignant neoplasm of colon: Secondary | ICD-10-CM | POA: Diagnosis not present

## 2018-12-15 DIAGNOSIS — E119 Type 2 diabetes mellitus without complications: Secondary | ICD-10-CM | POA: Diagnosis not present

## 2018-12-15 DIAGNOSIS — K5790 Diverticulosis of intestine, part unspecified, without perforation or abscess without bleeding: Secondary | ICD-10-CM | POA: Diagnosis not present

## 2018-12-15 DIAGNOSIS — N183 Chronic kidney disease, stage 3 (moderate): Secondary | ICD-10-CM | POA: Diagnosis not present

## 2018-12-15 DIAGNOSIS — Z86010 Personal history of colonic polyps: Secondary | ICD-10-CM | POA: Diagnosis not present

## 2018-12-15 DIAGNOSIS — K635 Polyp of colon: Secondary | ICD-10-CM | POA: Diagnosis not present

## 2018-12-15 DIAGNOSIS — N2581 Secondary hyperparathyroidism of renal origin: Secondary | ICD-10-CM | POA: Diagnosis not present

## 2019-01-02 ENCOUNTER — Ambulatory Visit (INDEPENDENT_AMBULATORY_CARE_PROVIDER_SITE_OTHER): Payer: PPO | Admitting: Family Medicine

## 2019-01-02 ENCOUNTER — Encounter: Payer: Self-pay | Admitting: Family Medicine

## 2019-01-02 ENCOUNTER — Other Ambulatory Visit: Payer: Self-pay

## 2019-01-02 VITALS — BP 100/64 | HR 76 | Ht 65.0 in | Wt 142.0 lb

## 2019-01-02 DIAGNOSIS — E782 Mixed hyperlipidemia: Secondary | ICD-10-CM | POA: Diagnosis not present

## 2019-01-02 DIAGNOSIS — I10 Essential (primary) hypertension: Secondary | ICD-10-CM | POA: Diagnosis not present

## 2019-01-02 DIAGNOSIS — K219 Gastro-esophageal reflux disease without esophagitis: Secondary | ICD-10-CM | POA: Diagnosis not present

## 2019-01-02 DIAGNOSIS — E119 Type 2 diabetes mellitus without complications: Secondary | ICD-10-CM

## 2019-01-02 MED ORDER — METFORMIN HCL 500 MG PO TABS: 500.0000 mg | ORAL_TABLET | Freq: Two times a day (BID) | ORAL | 1 refills | Status: DC

## 2019-01-02 MED ORDER — SIMVASTATIN 20 MG PO TABS: 20.0000 mg | ORAL_TABLET | Freq: Every day | ORAL | 1 refills | Status: DC

## 2019-01-02 MED ORDER — LANSOPRAZOLE 15 MG PO CPDR: 15.0000 mg | DELAYED_RELEASE_CAPSULE | ORAL | 1 refills | Status: DC

## 2019-01-02 MED ORDER — SITAGLIPTIN PHOSPHATE 100 MG PO TABS: 100.0000 mg | ORAL_TABLET | Freq: Every day | ORAL | 1 refills | Status: DC

## 2019-01-02 NOTE — Progress Notes (Signed)
Date:  01/02/2019   Name:  Jordan Thornton   DOB:  05/06/1930   MRN:  [id number]   Chief Complaint: Gastroesophageal Reflux; Hyperlipidemia; and Diabetes  Gastroesophageal Reflux  She reports no abdominal pain, no belching, no chest pain, no choking, no coughing, no dysphagia, no early satiety, no globus sensation, no heartburn, no hoarse voice, no nausea, no sore throat, no stridor, no tooth decay, no water brash or no wheezing. This is a new problem. The current episode started more than 1 month ago. The problem occurs constantly. The problem has been gradually improving. The symptoms are aggravated by certain foods. Pertinent negatives include no anemia, fatigue, melena, muscle weakness, orthopnea or weight loss. There are no known risk factors. She has tried a histamine-2 antagonist for the symptoms. The treatment provided moderate relief. Past procedures do not include an abdominal ultrasound, an EGD, esophageal manometry, esophageal pH monitoring, H. pylori antibody titer or a UGI.  Hyperlipidemia  This is a chronic problem. The current episode started more than 1 year ago. The problem is controlled. Recent lipid tests were reviewed and are normal. She has no history of chronic renal disease, diabetes, hypothyroidism, liver disease, obesity or nephrotic syndrome. Pertinent negatives include no chest pain, focal sensory loss, focal weakness, leg pain, myalgias or shortness of breath. Current antihyperlipidemic treatment includes statins. The current treatment provides moderate improvement of lipids. There are no compliance problems.  Risk factors for coronary artery disease include dyslipidemia, diabetes mellitus and hypertension.  Diabetes  She presents for her follow-up diabetic visit. She has type 2 diabetes mellitus. Her disease course has been stable. There are no hypoglycemic associated symptoms. Pertinent negatives for hypoglycemia include no dizziness, headaches or  nervousness/anxiousness. Pertinent negatives for diabetes include no blurred vision, no chest pain, no fatigue, no foot paresthesias, no foot ulcerations, no polydipsia, no polyphagia, no polyuria, no visual change, no weakness and no weight loss. There are no hypoglycemic complications. Symptoms are stable. There are no diabetic complications. There are no known risk factors for coronary artery disease. Current diabetic treatment includes oral agent (monotherapy). Her weight is stable. She is following a generally healthy diet. Meal planning includes avoidance of concentrated sweets and carbohydrate counting. She participates in exercise intermittently. Her home blood glucose trend is fluctuating minimally. Her breakfast blood glucose is taken between 8-9 am. Her breakfast blood glucose range is generally 110-130 mg/dl. An ACE inhibitor/angiotensin II receptor blocker is being taken. She does not see a podiatrist.Eye exam is current.    Review of Systems  Constitutional: Negative.  Negative for chills, fatigue, fever, unexpected weight change and weight loss.  HENT: Negative for congestion, ear discharge, ear pain, hoarse voice, rhinorrhea, sinus pressure, sneezing and sore throat.   Eyes: Negative for blurred vision, photophobia, pain, discharge, redness and itching.  Respiratory: Negative for cough, choking, shortness of breath, wheezing and stridor.   Cardiovascular: Negative for chest pain.  Gastrointestinal: Negative for abdominal pain, blood in stool, constipation, diarrhea, dysphagia, heartburn, melena, nausea and vomiting.  Endocrine: Negative for cold intolerance, heat intolerance, polydipsia, polyphagia and polyuria.  Genitourinary: Negative for dysuria, flank pain, frequency, hematuria, menstrual problem, pelvic pain, urgency, vaginal bleeding and vaginal discharge.  Musculoskeletal: Negative for arthralgias, back pain, myalgias and muscle weakness.  Skin: Negative for rash.   Allergic/Immunologic: Negative for environmental allergies and food allergies.  Neurological: Negative for dizziness, focal weakness, weakness, light-headedness, numbness and headaches.  Hematological: Negative for adenopathy. Does not bruise/bleed easily.  Psychiatric/Behavioral: Negative  for dysphoric mood. The patient is not nervous/anxious.     Patient Active Problem List   Diagnosis Date Noted  . Diabetes mellitus with no complication ([place]) 09/60/4540  . Familial multiple lipoprotein-type hyperlipidemia 01/03/2015  . Creatinine elevation 01/03/2015  . Diabetes ([place]) 12/15/2014  . Hyperlipidemia 12/15/2014  . Hypertension 12/15/2014  . Gastro-esophageal reflux disease without esophagitis 12/15/2014    No Known Allergies  Past Surgical History:  Procedure Laterality Date  . bladder tack    . BREAST BIOPSY Left 10-15 yrs ago   benign  . CATARACT EXTRACTION Bilateral 05/2015  . CHOLECYSTECTOMY OPEN    . VAGINAL HYSTERECTOMY      Social History   Tobacco Use  . Smoking status: Former Smoker    Packs/day: 0.25    Years: 25.00    Pack years: 6.25    Types: Cigarettes    Last attempt to quit: 1982    Years since quitting: 38.3  . Smokeless tobacco: Never Used  . Tobacco comment: smoking cessation materials not required  Substance Use Topics  . Alcohol use: No    Alcohol/week: 0.0 standard drinks  . Drug use: No     Medication list has been reviewed and updated.  Current Meds  Medication Sig  . ADVOCATE LANCETS MISC use once daily as directed to test blood sugar  . Blood Glucose Monitoring Suppl (FREESTYLE LITE) DEVI use once daily as directed to test blood sugar  . Cholecalciferol (VITAMIN D) 2000 units CAPS Take 1 capsule by mouth daily. otc  . FREESTYLE LITE test strip USE 1 STRIP TO CHECK GLUCOSE ONCE DAILY AS DIRECTED  . Lancet Devices (ADVOCATE LANCING DEVICE) MISC use once daily as directed to test blood sugar  . lansoprazole (PREVACID) 15 MG capsule Take  1 capsule (15 mg total) by mouth 1 day or 1 dose.  Marland Kitchen lisinopril-hydrochlorothiazide (PRINZIDE,ZESTORETIC) 20-12.5 MG tablet Take 1 tablet by mouth daily. Dr Holley Raring  . LORazepam (ATIVAN) 0.5 MG tablet Take 1 tablet (0.5 mg total) by mouth as needed.  . metFORMIN (GLUCOPHAGE) 500 MG tablet Take 1 tablet (500 mg total) by mouth 2 (two) times daily.  . simvastatin (ZOCOR) 20 MG tablet Take 1 tablet (20 mg total) by mouth daily at 6 PM.  . sitaGLIPtin (JANUVIA) 100 MG tablet Take 1 tablet (100 mg total) by mouth daily.    PHQ 2/9 Scores 01/02/2019 09/02/2018 02/09/2018 02/25/2017  PHQ - 2 Score 0 0 0 0  PHQ- 9 Score 0 0 0 -    BP Readings from Last 3 Encounters:  01/02/19 100/64  09/02/18 120/70  07/18/18 138/80    Physical Exam Vitals signs and nursing note reviewed.  Constitutional:      General: She is not in acute distress.    Appearance: She is not diaphoretic.  HENT:     Head: Normocephalic and atraumatic.     Right Ear: External ear normal.     Left Ear: External ear normal.     Nose: Nose normal. No congestion or rhinorrhea.     Mouth/Throat:     Mouth: Mucous membranes are moist.  Eyes:     General:        Right eye: No discharge.        Left eye: No discharge.     Conjunctiva/sclera: Conjunctivae normal.     Pupils: Pupils are equal, round, and reactive to light.  Neck:     Musculoskeletal: Normal range of motion and neck supple.  Thyroid: No thyromegaly.     Vascular: No JVD.  Cardiovascular:     Rate and Rhythm: Normal rate and regular rhythm.     Chest Wall: PMI is not displaced. No thrill.     Pulses: Normal pulses.          Carotid pulses are 2+ on the right side and 2+ on the left side.      Radial pulses are 2+ on the right side and 2+ on the left side.       Femoral pulses are 2+ on the right side and 2+ on the left side.      Popliteal pulses are 2+ on the right side and 2+ on the left side.       Dorsalis pedis pulses are 2+ on the right side and 2+ on  the left side.       Posterior tibial pulses are 2+ on the right side and 2+ on the left side.     Heart sounds: Normal heart sounds, S1 normal and S2 normal. Heart sounds not distant. No murmur. No systolic murmur. No diastolic murmur. No friction rub. No gallop. No S3 or S4 sounds.   Pulmonary:     Effort: Pulmonary effort is normal.     Breath sounds: Normal breath sounds. No wheezing or rhonchi.  Abdominal:     General: Bowel sounds are normal.     Palpations: Abdomen is soft. There is no mass.     Tenderness: There is no abdominal tenderness. There is no guarding.  Musculoskeletal: Normal range of motion.     Right lower leg: No edema.     Left lower leg: No edema.  Lymphadenopathy:     Cervical: No cervical adenopathy.  Skin:    General: Skin is warm and dry.  Neurological:     Mental Status: She is alert.     Deep Tendon Reflexes: Reflexes are normal and symmetric.     Wt Readings from Last 3 Encounters:  01/02/19 142 lb (64.4 kg)  09/02/18 148 lb (67.1 kg)  07/18/18 147 lb (66.7 kg)    BP 100/64   Pulse 76   Ht 5\' 5"  (1.651 m)   Wt 142 lb (64.4 kg)   BMI 23.63 kg/m   Assessment and Plan:  1. Gastro-esophageal reflux disease without esophagitis .  Controlled.  Continue Prevacid 15 mg once a day. - lansoprazole (PREVACID) 15 MG capsule; Take 1 capsule (15 mg total) by mouth 1 day or 1 dose.  Dispense: 90 capsule; Refill: 1  2. Essential hypertension Chronic.  Controlled.  Continue with renal function panel.  Continue lisinopril hydrochlorothiazide 20-12.5 mg.  Chronic. - Renal Function Panel  3. Diabetes mellitus with no complication (HCC) Chronic.  Controlled.  Continue metformin 500 mg twice a day and Januvia 100 mg once a day.  Will check hemoglobin A1c - metFORMIN (GLUCOPHAGE) 500 MG tablet; Take 1 tablet (500 mg total) by mouth 2 (two) times daily.  Dispense: 180 tablet; Refill: 1 - sitaGLIPtin (JANUVIA) 100 MG tablet; Take 1 tablet (100 mg total) by mouth  daily.  Dispense: 90 tablet; Refill: 1 - HgB A1c - Renal Function Panel  4. Mixed hyperlipidemia Chronic.  Controlled.  Continue simvastatin 20 mg once a day and will check renal function panel and lipid panel and adjust accordingly. - simvastatin (ZOCOR) 20 MG tablet; Take 1 tablet (20 mg total) by mouth daily at 6 PM.  Dispense: 90 tablet; Refill: 1 - Lipid Panel  With LDL/HDL Ratio

## 2019-01-02 NOTE — Patient Instructions (Signed)
This information is directly available on the CDC website: https://www.cdc.gov/coronavirus/2019-ncov/if-you-are-sick/steps-when-sick.html    Source:CDC Reference to specific commercial products, manufacturers, companies, or trademarks does not constitute its endorsement or recommendation by the U.S. Government, Department of Health and Human Services, or Centers for Disease Control and Prevention.  

## 2019-01-03 LAB — RENAL FUNCTION PANEL
Albumin: 4.3 g/dL (ref 3.6–4.6)
BUN/Creatinine Ratio: 11 — ABNORMAL LOW (ref 12–28)
BUN: 20 mg/dL (ref 8–27)
CO2: 26 mmol/L (ref 20–29)
Calcium: 9.9 mg/dL (ref 8.7–10.3)
Chloride: 102 mmol/L (ref 96–106)
Creatinine, Ser: 1.83 mg/dL — ABNORMAL HIGH (ref 0.57–1.00)
GFR calc Af Amer: 28 mL/min/{1.73_m2} — ABNORMAL LOW (ref >59)
GFR calc non Af Amer: 24 mL/min/{1.73_m2} — ABNORMAL LOW (ref >59)
Glucose: 111 mg/dL — ABNORMAL HIGH (ref 65–99)
Phosphorus: 3.5 mg/dL (ref 3.0–4.3)
Potassium: 4.9 mmol/L (ref 3.5–5.2)
Sodium: 141 mmol/L (ref 134–144)

## 2019-01-03 LAB — LIPID PANEL WITH LDL/HDL RATIO
Cholesterol, Total: 203 mg/dL — ABNORMAL HIGH (ref 100–199)
HDL: 62 mg/dL (ref >39)
LDL Calculated: 111 mg/dL — ABNORMAL HIGH (ref 0–99)
LDl/HDL Ratio: 1.8 ratio (ref 0.0–3.2)
Triglycerides: 150 mg/dL — ABNORMAL HIGH (ref 0–149)
VLDL Cholesterol Cal: 30 mg/dL (ref 5–40)

## 2019-01-03 LAB — HEMOGLOBIN A1C
Est. average glucose Bld gHb Est-mCnc: 146 mg/dL
Hgb A1c MFr Bld: 6.7 % — ABNORMAL HIGH (ref 4.8–5.6)

## 2019-02-13 ENCOUNTER — Other Ambulatory Visit: Payer: Self-pay

## 2019-02-13 ENCOUNTER — Ambulatory Visit (INDEPENDENT_AMBULATORY_CARE_PROVIDER_SITE_OTHER): Payer: PPO

## 2019-02-13 VITALS — BP 138/82 | HR 69 | Temp 97.5°F | Resp 16 | Ht 65.0 in | Wt 141.4 lb

## 2019-02-13 DIAGNOSIS — Z0000 Encounter for general adult medical examination without abnormal findings: Secondary | ICD-10-CM | POA: Diagnosis not present

## 2019-02-13 DIAGNOSIS — N183 Chronic kidney disease, stage 3 (moderate): Secondary | ICD-10-CM | POA: Diagnosis not present

## 2019-02-13 DIAGNOSIS — E119 Type 2 diabetes mellitus without complications: Secondary | ICD-10-CM | POA: Diagnosis not present

## 2019-02-13 DIAGNOSIS — I10 Essential (primary) hypertension: Secondary | ICD-10-CM | POA: Diagnosis not present
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
## 2019-02-13 NOTE — Patient Instructions (Signed)
[name] , Thank you for taking time to come for your Medicare Wellness Visit. I appreciate your ongoing commitment to your health goals. Please review the following plan we discussed and let me know if I can assist you in the future.   Screening recommendations/referrals: Colonoscopy: no longer required Mammogram: done 08/29/18. Repeat every year.  Bone Density: done 10/15/08. Recommended yearly ophthalmology/optometry visit for glaucoma screening and checkup Recommended yearly dental visit for hygiene and checkup  Vaccinations: Influenza vaccine: done 05/23/18 Pneumococcal vaccine: done 05/23/15 Tdap vaccine: done 05/23/15 Shingles vaccine: done 08/13/18    Conditions/risks identified: Recommend drinking 6-8 glasses of water per day.   Next appointment: Please follow up in one year for your Medicare Annual Wellness visit.     Preventive Care 23 Years and Older, Female Preventive care refers to lifestyle choices and visits with your health care provider that can promote health and wellness. What does preventive care include?  A yearly physical exam. This is also called an annual well check.  Dental exams once or twice a year.  Routine eye exams. Ask your health care provider how often you should have your eyes checked.  Personal lifestyle choices, including:  Daily care of your teeth and gums.  Regular physical activity.  Eating a healthy diet.  Avoiding tobacco and drug use.  Limiting alcohol use.  Practicing safe sex.  Taking low-dose aspirin every day.  Taking vitamin and mineral supplements as recommended by your health care provider. What happens during an annual well check? The services and screenings done by your health care provider during your annual well check will depend on your age, overall health, lifestyle risk factors, and family history of disease. Counseling  Your health care provider may ask you questions about your:  Alcohol use.  Tobacco use.   Drug use.  Emotional well-being.  Home and relationship well-being.  Sexual activity.  Eating habits.  History of falls.  Memory and ability to understand (cognition).  Work and work Statistician.  Reproductive health. Screening  You may have the following tests or measurements:  Height, weight, and BMI.  Blood pressure.  Lipid and cholesterol levels. These may be checked every 5 years, or more frequently if you are over 33 years old.  Skin check.  Lung cancer screening. You may have this screening every year starting at age 28 if you have a 30-pack-year history of smoking and currently smoke or have quit within the past 15 years.  Fecal occult blood test (FOBT) of the stool. You may have this test every year starting at age 16.  Flexible sigmoidoscopy or colonoscopy. You may have a sigmoidoscopy every 5 years or a colonoscopy every 10 years starting at age 48.  Hepatitis C blood test.  Hepatitis B blood test.  Sexually transmitted disease (STD) testing.  Diabetes screening. This is done by checking your blood sugar (glucose) after you have not eaten for a while (fasting). You may have this done every 1-3 years.  Bone density scan. This is done to screen for osteoporosis. You may have this done starting at age 27.  Mammogram. This may be done every 1-2 years. Talk to your health care provider about how often you should have regular mammograms. Talk with your health care provider about your test results, treatment options, and if necessary, the need for more tests. Vaccines  Your health care provider may recommend certain vaccines, such as:  Influenza vaccine. This is recommended every year.  Tetanus, diphtheria, and acellular pertussis (  Tdap, Td) vaccine. You may need a Td booster every 10 years.  Zoster vaccine. You may need this after age 28.  Pneumococcal 13-valent conjugate (PCV13) vaccine. One dose is recommended after age 38.  Pneumococcal  polysaccharide (PPSV23) vaccine. One dose is recommended after age 70. Talk to your health care provider about which screenings and vaccines you need and how often you need them. This information is not intended to replace advice given to you by your health care provider. Make sure you discuss any questions you have with your health care provider. Document Released: 09/27/2015 Document Revised: 05/20/2016 Document Reviewed: 07/02/2015 Elsevier Interactive Patient Education  2017 [employer] Prevention in the Home Falls can cause injuries. They can happen to people of all ages. There are many things you can do to make your home safe and to help prevent falls. What can I do on the outside of my home?  Regularly fix the edges of walkways and driveways and fix any cracks.  Remove anything that might make you trip as you walk through a door, such as a raised step or threshold.  Trim any bushes or trees on the path to your home.  Use bright outdoor lighting.  Clear any walking paths of anything that might make someone trip, such as rocks or tools.  Regularly check to see if handrails are loose or broken. Make sure that both sides of any steps have handrails.  Any raised decks and porches should have guardrails on the edges.  Have any leaves, snow, or ice cleared regularly.  Use sand or salt on walking paths during winter.  Clean up any spills in your garage right away. This includes oil or grease spills. What can I do in the bathroom?  Use night lights.  Install grab bars by the toilet and in the tub and shower. Do not use towel bars as grab bars.  Use non-skid mats or decals in the tub or shower.  If you need to sit down in the shower, use a plastic, non-slip stool.  Keep the floor dry. Clean up any water that spills on the floor as soon as it happens.  Remove soap buildup in the tub or shower regularly.  Attach bath mats securely with double-sided non-slip rug tape.   Do not have throw rugs and other things on the floor that can make you trip. What can I do in the bedroom?  Use night lights.  Make sure that you have a light by your bed that is easy to reach.  Do not use any sheets or blankets that are too big for your bed. They should not hang down onto the floor.  Have a firm chair that has side arms. You can use this for support while you get dressed.  Do not have throw rugs and other things on the floor that can make you trip. What can I do in the kitchen?  Clean up any spills right away.  Avoid walking on wet floors.  Keep items that you use a lot in easy-to-reach places.  If you need to reach something above you, use a strong step stool that has a grab bar.  Keep electrical cords out of the way.  Do not use floor polish or wax that makes floors slippery. If you must use wax, use non-skid floor wax.  Do not have throw rugs and other things on the floor that can make you trip. What can I do with my stairs?  Do  not leave any items on the stairs.  Make sure that there are handrails on both sides of the stairs and use them. Fix handrails that are broken or loose. Make sure that handrails are as long as the stairways.  Check any carpeting to make sure that it is firmly attached to the stairs. Fix any carpet that is loose or worn.  Avoid having throw rugs at the top or bottom of the stairs. If you do have throw rugs, attach them to the floor with carpet tape.  Make sure that you have a light switch at the top of the stairs and the bottom of the stairs. If you do not have them, ask someone to add them for you. What else can I do to help prevent falls?  Wear shoes that:  Do not have high heels.  Have rubber bottoms.  Are comfortable and fit you well.  Are closed at the toe. Do not wear sandals.  If you use a stepladder:  Make sure that it is fully opened. Do not climb a closed stepladder.  Make sure that both sides of the  stepladder are locked into place.  Ask someone to hold it for you, if possible.  Clearly mark and make sure that you can see:  Any grab bars or handrails.  First and last steps.  Where the edge of each step is.  Use tools that help you move around (mobility aids) if they are needed. These include:  Canes.  Walkers.  Scooters.  Crutches.  Turn on the lights when you go into a dark area. Replace any light bulbs as soon as they burn out.  Set up your furniture so you have a clear path. Avoid moving your furniture around.  If any of your floors are uneven, fix them.  If there are any pets around you, be aware of where they are.  Review your medicines with your doctor. Some medicines can make you feel dizzy. This can increase your chance of falling. Ask your doctor what other things that you can do to help prevent falls. This information is not intended to replace advice given to you by your health care provider. Make sure you discuss any questions you have with your health care provider. Document Released: 06/27/2009 Document Revised: 02/06/2016 Document Reviewed: 10/05/2014 Elsevier Interactive Patient Education  2017 [employer].

## 2019-03-03 ENCOUNTER — Other Ambulatory Visit: Payer: Self-pay | Admitting: Family Medicine

## 2019-03-03 DIAGNOSIS — E119 Type 2 diabetes mellitus without complications: Secondary | ICD-10-CM

## 2019-03-21 DIAGNOSIS — Z794 Long term (current) use of insulin: Secondary | ICD-10-CM | POA: Diagnosis not present

## 2019-03-21 DIAGNOSIS — E782 Mixed hyperlipidemia: Secondary | ICD-10-CM | POA: Diagnosis not present

## 2019-03-21 DIAGNOSIS — K219 Gastro-esophageal reflux disease without esophagitis: Secondary | ICD-10-CM | POA: Diagnosis not present

## 2019-03-21 DIAGNOSIS — E119 Type 2 diabetes mellitus without complications: Secondary | ICD-10-CM | POA: Diagnosis not present

## 2019-03-21 DIAGNOSIS — R011 Cardiac murmur, unspecified: Secondary | ICD-10-CM | POA: Diagnosis not present

## 2019-03-21 DIAGNOSIS — F419 Anxiety disorder, unspecified: Secondary | ICD-10-CM | POA: Diagnosis not present

## 2019-03-21 DIAGNOSIS — I10 Essential (primary) hypertension: Secondary | ICD-10-CM | POA: Diagnosis not present

## 2019-03-21 DIAGNOSIS — I208 Other forms of angina pectoris: Secondary | ICD-10-CM | POA: Diagnosis not present

## 2019-05-04 ENCOUNTER — Ambulatory Visit (INDEPENDENT_AMBULATORY_CARE_PROVIDER_SITE_OTHER): Payer: PPO | Admitting: Family Medicine

## 2019-05-04 ENCOUNTER — Encounter: Payer: Self-pay | Admitting: Family Medicine

## 2019-05-04 ENCOUNTER — Other Ambulatory Visit: Payer: Self-pay

## 2019-05-04 VITALS — BP 120/82 | HR 64 | Ht 65.0 in | Wt 140.0 lb

## 2019-05-04 DIAGNOSIS — E119 Type 2 diabetes mellitus without complications: Secondary | ICD-10-CM

## 2019-05-04 DIAGNOSIS — F419 Anxiety disorder, unspecified: Secondary | ICD-10-CM

## 2019-05-04 DIAGNOSIS — K219 Gastro-esophageal reflux disease without esophagitis: Secondary | ICD-10-CM | POA: Diagnosis not present

## 2019-05-04 DIAGNOSIS — E782 Mixed hyperlipidemia: Secondary | ICD-10-CM

## 2019-05-04 DIAGNOSIS — Z23 Encounter for immunization: Secondary | ICD-10-CM

## 2019-05-04 DIAGNOSIS — R69 Illness, unspecified: Secondary | ICD-10-CM

## 2019-05-04 DIAGNOSIS — I10 Essential (primary) hypertension: Secondary | ICD-10-CM

## 2019-05-04 MED ORDER — SIMVASTATIN 20 MG PO TABS: 20.0000 mg | ORAL_TABLET | Freq: Every day | ORAL | 1 refills | Status: DC

## 2019-05-04 MED ORDER — SITAGLIPTIN PHOSPHATE 100 MG PO TABS: 100.0000 mg | ORAL_TABLET | Freq: Every day | ORAL | 1 refills | Status: DC

## 2019-05-04 MED ORDER — METFORMIN HCL 500 MG PO TABS: 500.0000 mg | ORAL_TABLET | Freq: Two times a day (BID) | ORAL | 1 refills | Status: DC

## 2019-05-04 MED ORDER — LORAZEPAM 0.5 MG PO TABS: 0.5000 mg | ORAL_TABLET | ORAL | 5 refills | Status: DC | PRN

## 2019-05-04 MED ORDER — LANSOPRAZOLE 15 MG PO CPDR: 15.0000 mg | DELAYED_RELEASE_CAPSULE | ORAL | 1 refills | Status: DC

## 2019-05-04 NOTE — Progress Notes (Signed)
Date:  05/04/2019   Name:  Jordan Thornton   DOB:  08-02-1930   MRN:  [id number]   Chief Complaint: Diabetes, Hyperlipidemia, Gastroesophageal Reflux, and Anxiety (PHQ9=1 and GAD7=3)  Diabetes She presents for her follow-up diabetic visit. She has type 2 diabetes mellitus. Her disease course has been stable. Hypoglycemia symptoms include nervousness/anxiousness. Pertinent negatives for hypoglycemia include no confusion, dizziness or headaches. There are no diabetic associated symptoms. Pertinent negatives for diabetes include no chest pain, no fatigue, no polydipsia, no polyphagia, no polyuria, no weakness and no weight loss. There are no hypoglycemic complications. Symptoms are stable. There are no diabetic complications. Risk factors for coronary artery disease include diabetes mellitus, dyslipidemia, hypertension and post-menopausal. Current diabetic treatment includes oral agent (dual therapy). She is compliant with treatment all of the time. She is following a generally healthy diet. Meal planning includes avoidance of concentrated sweets and carbohydrate counting. She participates in exercise intermittently. Her breakfast blood glucose is taken between 8-9 am. Her breakfast blood glucose range is generally 110-130 mg/dl. An ACE inhibitor/angiotensin II receptor blocker is being taken.  Hyperlipidemia This is a chronic problem. The current episode started more than 1 year ago. The problem is uncontrolled. Recent lipid tests were reviewed and are normal. She has no history of chronic renal disease, diabetes, hypothyroidism, liver disease, obesity or nephrotic syndrome. Factors aggravating her hyperlipidemia include thiazides. Pertinent negatives include no chest pain, focal sensory loss, focal weakness, leg pain, myalgias or shortness of breath. Current antihyperlipidemic treatment includes statins. The current treatment provides moderate improvement of lipids. There are no compliance  problems.   Gastroesophageal Reflux She reports no abdominal pain, no belching, no chest pain, no choking, no coughing, no dysphagia, no early satiety, no globus sensation, no heartburn, no hoarse voice, no nausea, no sore throat, no stridor, no tooth decay, no water brash or no wheezing. This is a chronic problem. The current episode started more than 1 year ago. The problem occurs frequently. The problem has been gradually worsening. The symptoms are aggravated by certain foods. Pertinent negatives include no anemia, fatigue, melena, muscle weakness, orthopnea or weight loss. She has tried a PPI for the symptoms. The treatment provided moderate relief.  Anxiety Presents for follow-up visit. Symptoms include nervous/anxious behavior. Patient reports no chest pain, compulsions, confusion, decreased concentration, dizziness, excessive worry, nausea, panic or shortness of breath. Symptoms occur occasionally. The severity of symptoms is mild. The quality of sleep is good.    Hypertension This is a chronic problem. The current episode started more than 1 year ago. The problem has been waxing and waning since onset. The problem is controlled. Associated symptoms include anxiety. Pertinent negatives include no chest pain, headaches or shortness of breath. Risk factors for coronary artery disease include dyslipidemia and diabetes mellitus. Past treatments include diuretics and ACE inhibitors. There is no history of chronic renal disease.    Review of Systems  Constitutional: Negative.  Negative for chills, fatigue, fever, unexpected weight change and weight loss.  HENT: Negative for congestion, ear discharge, ear pain, hoarse voice, rhinorrhea, sinus pressure, sneezing and sore throat.   Eyes: Negative for photophobia, pain, discharge, redness and itching.  Respiratory: Negative for cough, choking, shortness of breath, wheezing and stridor.   Cardiovascular: Negative for chest pain.  Gastrointestinal:  Negative for abdominal pain, blood in stool, constipation, diarrhea, dysphagia, heartburn, melena, nausea and vomiting.  Endocrine: Negative for cold intolerance, heat intolerance, polydipsia, polyphagia and polyuria.  Genitourinary: Negative for dysuria,  flank pain, frequency, hematuria, menstrual problem, pelvic pain, urgency, vaginal bleeding and vaginal discharge.  Musculoskeletal: Negative for arthralgias, back pain, myalgias and muscle weakness.  Skin: Negative for rash.  Allergic/Immunologic: Negative for environmental allergies and food allergies.  Neurological: Negative for dizziness, focal weakness, weakness, light-headedness, numbness and headaches.  Hematological: Negative for adenopathy. Does not bruise/bleed easily.  Psychiatric/Behavioral: Negative for confusion, decreased concentration and dysphoric mood. The patient is nervous/anxious.     Patient Active Problem List   Diagnosis Date Noted   Diabetes mellitus with no complication ([place]) 0000000   Familial multiple lipoprotein-type hyperlipidemia 01/03/2015   Creatinine elevation 01/03/2015   Diabetes ([place]) 12/15/2014   Hyperlipidemia 12/15/2014   Hypertension 12/15/2014   Gastro-esophageal reflux disease without esophagitis 12/15/2014    No Known Allergies  Past Surgical History:  Procedure Laterality Date   bladder tack     BREAST BIOPSY Left 10-15 yrs ago   benign   CATARACT EXTRACTION Bilateral 05/2015   CHOLECYSTECTOMY OPEN     VAGINAL HYSTERECTOMY      Social History   Tobacco Use   Smoking status: Former Smoker    Packs/day: 0.25    Years: 25.00    Pack years: 6.25    Types: Cigarettes    Quit date: 1982    Years since quitting: 38.6   Smokeless tobacco: Never Used   Tobacco comment: smoking cessation materials not required  Substance Use Topics   Alcohol use: No    Alcohol/week: 0.0 standard drinks   Drug use: No     Medication list has been reviewed and  updated.  Current Meds  Medication Sig   ADVOCATE LANCETS MISC use once daily as directed to test blood sugar   Blood Glucose Monitoring Suppl (FREESTYLE LITE) DEVI use once daily as directed to test blood sugar   Cholecalciferol (VITAMIN D) 2000 units CAPS Take 1 capsule by mouth daily. otc   Elderberry 575 MG/5ML SYRP Take by mouth.   FREESTYLE LITE test strip USE  STRIP TO CHECK GLUCOSE ONCE DAILY AS DIRECTED   Lancet Devices (ADVOCATE LANCING DEVICE) MISC use once daily as directed to test blood sugar   lansoprazole (PREVACID) 15 MG capsule Take 1 capsule (15 mg total) by mouth 1 day or 1 dose.   lisinopril-hydrochlorothiazide (PRINZIDE,ZESTORETIC) 20-12.5 MG tablet Take 1 tablet by mouth daily. Dr Holley Raring   LORazepam (ATIVAN) 0.5 MG tablet Take 1 tablet (0.5 mg total) by mouth as needed.   metFORMIN (GLUCOPHAGE) 500 MG tablet Take 1 tablet (500 mg total) by mouth 2 (two) times daily.   Omega-3 Fatty Acids (FISH OIL) 1000 MG CAPS Take by mouth.   simvastatin (ZOCOR) 20 MG tablet Take 1 tablet (20 mg total) by mouth daily at 6 PM.   sitaGLIPtin (JANUVIA) 100 MG tablet Take 1 tablet (100 mg total) by mouth daily.    PHQ 2/9 Scores 05/04/2019 02/13/2019 01/02/2019 09/02/2018  PHQ - 2 Score 1 0 0 0  PHQ- 9 Score 1 - 0 0    BP Readings from Last 3 Encounters:  05/04/19 120/82  02/13/19 138/82  01/02/19 100/64    Physical Exam Vitals signs and nursing note reviewed.  Constitutional:      Appearance: She is well-developed.  HENT:     Head: Normocephalic.     Right Ear: Tympanic membrane, ear canal and external ear normal.     Left Ear: Tympanic membrane, ear canal and external ear normal.  Eyes:     General: Lids  are everted, no foreign bodies appreciated. No scleral icterus.       Left eye: No foreign body or hordeolum.     Conjunctiva/sclera: Conjunctivae normal.     Right eye: Right conjunctiva is not injected.     Left eye: Left conjunctiva is not injected.      Pupils: Pupils are equal, round, and reactive to light.  Neck:     Musculoskeletal: Normal range of motion and neck supple.     Thyroid: No thyromegaly.     Vascular: No JVD.     Trachea: No tracheal deviation.  Cardiovascular:     Rate and Rhythm: Normal rate and regular rhythm.     Heart sounds: Normal heart sounds. No murmur. No friction rub. No gallop.   Pulmonary:     Effort: Pulmonary effort is normal. No respiratory distress.     Breath sounds: Normal breath sounds. No wheezing or rales.  Abdominal:     General: Bowel sounds are normal.     Palpations: Abdomen is soft. There is no mass.     Tenderness: There is no abdominal tenderness. There is no guarding or rebound.  Musculoskeletal: Normal range of motion.        General: No tenderness.  Lymphadenopathy:     Cervical: No cervical adenopathy.  Skin:    General: Skin is warm.     Findings: No rash.  Neurological:     Mental Status: She is alert and oriented to person, place, and time.     Cranial Nerves: No cranial nerve deficit.     Deep Tendon Reflexes: Reflexes normal.  Psychiatric:        Mood and Affect: Mood is not anxious or depressed.     Wt Readings from Last 3 Encounters:  05/04/19 140 lb (63.5 kg)  02/13/19 141 lb 6.4 oz (64.1 kg)  01/02/19 142 lb (64.4 kg)    BP 120/82    Pulse 64    Ht 5\' 5"  (1.651 m)    Wt 140 lb (63.5 kg)    BMI 23.30 kg/m   Assessment and Plan: 1. Gastro-esophageal reflux disease without esophagitis Chronic.  Controlled.  Continue Prevacid 15 mg daily day. - lansoprazole (PREVACID) 15 MG capsule; Take 1 capsule (15 mg total) by mouth 1 day or 1 dose.  Dispense: 90 capsule; Refill: 1  2. Essential hypertension Chronic.  Controlled.  Patient is also being monitored by Dr. Holley Raring who prescribes most of her medications but we rediscussed sodium and proper intake.  3. Diabetes mellitus with no complication (H[facility]) Chronic.  Controlled.  Continue metformin 500 mg once a day and  Januvia 100 mg daily a.m.  Will check an A1c. - Lipid Panel With LDL/HDL Ratio - HgB A1c - metFORMIN (GLUCOPHAGE) 500 MG tablet; Take 1 tablet (500 mg total) by mouth 2 (two) times daily.  Dispense: 180 tablet; Refill: 1 - sitaGLIPtin (JANUVIA) 100 MG tablet; Take 1 tablet (100 mg total) by mouth daily.  Dispense: 90 tablet; Refill: 1  4. Mixed hyperlipidemia Chronic.  Controlled.  Continue simvastatin 20 mg once a day.  Will check lipid panel. - Lipid Panel With LDL/HDL Ratio - simvastatin (ZOCOR) 20 MG tablet; Take 1 tablet (20 mg total) by mouth daily at 6 PM.  Dispense: 90 tablet; Refill: 1  5. Anxiety Patient requested refill on her Ativan.  Patient does have a longstanding history of chronic disease because she has to deal with church choir issues.  More than understand that  she needs this and will refill her Ativan 0.5 mg 1 as needed - LORazepam (ATIVAN) 0.5 MG tablet; Take 1 tablet (0.5 mg total) by mouth as needed.  Dispense: 30 tablet; Refill: 5  6. Taking medication for chronic disease Patient is on statin we will check her hepatotoxicity possibility due to statin use. - Hepatic function panel  7. Anxiety As noted - LORazepam (ATIVAN) 0.5 MG tablet; Take 1 tablet (0.5 mg total) by mouth as needed.  Dispense: 30 tablet; Refill: 5  8. Influenza vaccine needed Patient is due for her influenza dose and would like to do this at this time and we will administer high-dose influenza vaccine. - Flu Vaccine QUAD High Dose(Fluad)

## 2019-05-05 DIAGNOSIS — E119 Type 2 diabetes mellitus without complications: Secondary | ICD-10-CM | POA: Diagnosis not present

## 2019-05-05 DIAGNOSIS — I10 Essential (primary) hypertension: Secondary | ICD-10-CM | POA: Diagnosis not present

## 2019-05-05 DIAGNOSIS — N2581 Secondary hyperparathyroidism of renal origin: Secondary | ICD-10-CM | POA: Diagnosis not present

## 2019-05-05 DIAGNOSIS — N183 Chronic kidney disease, stage 3 (moderate): Secondary | ICD-10-CM | POA: Diagnosis not present

## 2019-05-05 LAB — HEPATIC FUNCTION PANEL
ALT: 7 IU/L (ref 0–32)
AST: 13 IU/L (ref 0–40)
Albumin: 4.4 g/dL (ref 3.6–4.6)
Alkaline Phosphatase: 62 IU/L (ref 39–117)
Bilirubin Total: 0.6 mg/dL (ref 0.0–1.2)
Bilirubin, Direct: 0.15 mg/dL (ref 0.00–0.40)
Total Protein: 6.6 g/dL (ref 6.0–8.5)

## 2019-05-05 LAB — LIPID PANEL WITH LDL/HDL RATIO
Cholesterol, Total: 170 mg/dL (ref 100–199)
HDL: 62 mg/dL (ref >39)
LDL Calculated: 86 mg/dL (ref 0–99)
LDl/HDL Ratio: 1.4 ratio (ref 0.0–3.2)
Triglycerides: 109 mg/dL (ref 0–149)
VLDL Cholesterol Cal: 22 mg/dL (ref 5–40)

## 2019-05-05 LAB — HEMOGLOBIN A1C
Est. average glucose Bld gHb Est-mCnc: 143 mg/dL
Hgb A1c MFr Bld: 6.6 % — ABNORMAL HIGH (ref 4.8–5.6)

## 2019-07-17 ENCOUNTER — Other Ambulatory Visit: Payer: Self-pay | Admitting: Family Medicine

## 2019-07-17 DIAGNOSIS — Z1231 Encounter for screening mammogram for malignant neoplasm of breast: Secondary | ICD-10-CM

## 2019-07-21 ENCOUNTER — Other Ambulatory Visit: Payer: Self-pay

## 2019-07-21 DIAGNOSIS — Z20822 Contact with and (suspected) exposure to covid-19: Secondary | ICD-10-CM

## 2019-07-23 LAB — NOVEL CORONAVIRUS, NAA: SARS-CoV-2, NAA: NOT DETECTED

## 2019-08-31 ENCOUNTER — Other Ambulatory Visit: Payer: Self-pay

## 2019-08-31 ENCOUNTER — Ambulatory Visit
Admission: RE | Admit: 2019-08-31 | Discharge: 2019-08-31 | Disposition: A | Discharge status: Home / Self Care | Payer: PPO | Source: Ambulatory Visit | Attending: Family Medicine | Admitting: Family Medicine

## 2019-08-31 DIAGNOSIS — Z1231 Encounter for screening mammogram for malignant neoplasm of breast: Secondary | ICD-10-CM | POA: Insufficient documentation

## 2019-09-02 DIAGNOSIS — N183 Chronic kidney disease, stage 3 unspecified: Secondary | ICD-10-CM | POA: Insufficient documentation

## 2019-09-05 ENCOUNTER — Ambulatory Visit: Payer: PPO | Admitting: Family Medicine

## 2019-09-05 DIAGNOSIS — Z9841 Cataract extraction status, right eye: Secondary | ICD-10-CM | POA: Diagnosis not present

## 2019-09-05 DIAGNOSIS — Z9842 Cataract extraction status, left eye: Secondary | ICD-10-CM | POA: Diagnosis not present

## 2019-09-05 DIAGNOSIS — H52223 Regular astigmatism, bilateral: Secondary | ICD-10-CM | POA: Diagnosis not present

## 2019-09-05 DIAGNOSIS — E119 Type 2 diabetes mellitus without complications: Secondary | ICD-10-CM | POA: Diagnosis not present

## 2019-09-06 DIAGNOSIS — I10 Essential (primary) hypertension: Secondary | ICD-10-CM | POA: Diagnosis not present

## 2019-09-06 DIAGNOSIS — N2581 Secondary hyperparathyroidism of renal origin: Secondary | ICD-10-CM | POA: Diagnosis not present

## 2019-09-06 DIAGNOSIS — E119 Type 2 diabetes mellitus without complications: Secondary | ICD-10-CM | POA: Diagnosis not present

## 2019-09-06 DIAGNOSIS — N1832 Chronic kidney disease, stage 3b: Secondary | ICD-10-CM | POA: Diagnosis not present

## 2019-09-11 ENCOUNTER — Ambulatory Visit
Admission: RE | Admit: 2019-09-11 | Discharge: 2019-09-11 | Disposition: A | Discharge status: Home / Self Care | Payer: PPO | Attending: Family Medicine | Admitting: Family Medicine

## 2019-09-11 ENCOUNTER — Ambulatory Visit
Admission: RE | Admit: 2019-09-11 | Discharge: 2019-09-11 | Disposition: A | Discharge status: Home / Self Care | Payer: PPO | Source: Ambulatory Visit | Attending: Family Medicine | Admitting: Family Medicine

## 2019-09-11 ENCOUNTER — Ambulatory Visit (INDEPENDENT_AMBULATORY_CARE_PROVIDER_SITE_OTHER): Payer: PPO | Admitting: Family Medicine

## 2019-09-11 ENCOUNTER — Other Ambulatory Visit: Payer: Self-pay

## 2019-09-11 ENCOUNTER — Encounter: Payer: Self-pay | Admitting: Family Medicine

## 2019-09-11 VITALS — BP 130/62 | HR 64 | Ht 65.0 in | Wt 142.0 lb

## 2019-09-11 DIAGNOSIS — I10 Essential (primary) hypertension: Secondary | ICD-10-CM | POA: Diagnosis not present

## 2019-09-11 DIAGNOSIS — E782 Mixed hyperlipidemia: Secondary | ICD-10-CM

## 2019-09-11 DIAGNOSIS — G8929 Other chronic pain: Secondary | ICD-10-CM | POA: Insufficient documentation

## 2019-09-11 DIAGNOSIS — M545 Low back pain, unspecified: Secondary | ICD-10-CM

## 2019-09-11 DIAGNOSIS — F419 Anxiety disorder, unspecified: Secondary | ICD-10-CM | POA: Diagnosis not present

## 2019-09-11 DIAGNOSIS — K219 Gastro-esophageal reflux disease without esophagitis: Secondary | ICD-10-CM

## 2019-09-11 DIAGNOSIS — E119 Type 2 diabetes mellitus without complications: Secondary | ICD-10-CM

## 2019-09-11 MED ORDER — SITAGLIPTIN PHOSPHATE 100 MG PO TABS: 100.0000 mg | ORAL_TABLET | Freq: Every day | ORAL | 1 refills | Status: DC

## 2019-09-11 MED ORDER — LANSOPRAZOLE 15 MG PO CPDR: 15.0000 mg | DELAYED_RELEASE_CAPSULE | ORAL | 1 refills | Status: DC

## 2019-09-11 MED ORDER — METFORMIN HCL 500 MG PO TABS: 500.0000 mg | ORAL_TABLET | Freq: Two times a day (BID) | ORAL | 1 refills | Status: DC

## 2019-09-11 MED ORDER — SIMVASTATIN 20 MG PO TABS: 20.0000 mg | ORAL_TABLET | Freq: Every day | ORAL | 1 refills | Status: DC

## 2019-09-11 MED ORDER — LISINOPRIL-HYDROCHLOROTHIAZIDE 20-12.5 MG PO TABS: 1.0000 | ORAL_TABLET | Freq: Every day | ORAL | 1 refills | Status: DC

## 2019-09-11 MED ORDER — LORAZEPAM 0.5 MG PO TABS: 0.5000 mg | ORAL_TABLET | ORAL | 5 refills | Status: DC | PRN

## 2019-09-11 NOTE — Progress Notes (Signed)
Date:  09/11/2019   Name:  Jordan Thornton   DOB:  August 28, 1930   MRN:  [id number]   Chief Complaint: Hypertension, Hyperlipidemia, Diabetes, Anxiety (PHQ9=2 and GAD7=2), and Back Pain (had a fall x 6 month ago- has been taking Tylenol arthritis)  Hypertension This is a chronic problem. The current episode started more than 1 year ago. The problem has been waxing and waning since onset. The problem is controlled. Associated symptoms include anxiety. Pertinent negatives include no blurred vision, chest pain, headaches, malaise/fatigue, neck pain, orthopnea, palpitations, peripheral edema, PND, shortness of breath or sweats. There are no associated agents to hypertension. There are no known risk factors for coronary artery disease. Past treatments include ACE inhibitors and diuretics. The current treatment provides moderate improvement. There are no compliance problems.  There is no history of angina, kidney disease, CAD/MI, CVA, heart failure, PVD or retinopathy. There is no history of chronic renal disease, a hypertension causing med or renovascular disease.  Hyperlipidemia This is a chronic problem. The current episode started more than 1 year ago. The problem is controlled. Recent lipid tests were reviewed and are normal. She has no history of chronic renal disease, diabetes, hypothyroidism, liver disease, obesity or nephrotic syndrome. There are no known factors aggravating her hyperlipidemia. Pertinent negatives include no chest pain, focal sensory loss, focal weakness, leg pain, myalgias or shortness of breath. Current antihyperlipidemic treatment includes statins. The current treatment provides mild improvement of lipids. There are no compliance problems.   Diabetes She presents for her follow-up diabetic visit. She has type 2 diabetes mellitus. Her disease course has been stable. Hypoglycemia symptoms include nervousness/anxiousness. Pertinent negatives for hypoglycemia include no  confusion, dizziness, headaches or sweats. Pertinent negatives for diabetes include no blurred vision, no chest pain, no fatigue, no foot paresthesias, no foot ulcerations, no polydipsia, no polyphagia, no polyuria, no visual change, no weakness and no weight loss. There are no hypoglycemic complications. Symptoms are stable. There are no diabetic complications. Pertinent negatives for diabetic complications include no CVA, impotence, PVD or retinopathy. Risk factors for coronary artery disease include hypertension and dyslipidemia. Current diabetic treatment includes oral agent (dual therapy). She is compliant with treatment all of the time. Her weight is stable. She is following a generally healthy diet. Meal planning includes avoidance of concentrated sweets and carbohydrate counting. She participates in exercise daily. Her breakfast blood glucose is taken between 8-9 am. Her breakfast blood glucose range is generally 90-110 mg/dl. An ACE inhibitor/angiotensin II receptor blocker is being taken. She does not see a podiatrist.Eye exam is current.  Anxiety Presents for follow-up visit. Symptoms include excessive worry and nervous/anxious behavior. Patient reports no chest pain, compulsions, confusion, decreased concentration, depressed mood, dizziness, dry mouth, feeling of choking, hyperventilation, impotence, insomnia, irritability, malaise, muscle tension, nausea, obsessions, palpitations, panic, restlessness, shortness of breath or suicidal ideas. Primary symptoms comment: neuropathy. Symptoms occur constantly. The severity of symptoms is mild.    Back Pain This is a new problem. The current episode started more than 1 month ago (6 mos). The problem occurs intermittently. The problem has been waxing and waning since onset. The pain is present in the lumbar spine. The quality of the pain is described as aching. The pain is mild. The pain is worse during the day (getting started in the morn1ng). Pertinent  negatives include no abdominal pain, bladder incontinence, bowel incontinence, chest pain, dysuria, fever, headaches, leg pain, numbness, paresis, paresthesias, pelvic pain, perianal numbness, weakness or weight loss.  Treatments tried: tylenol. The treatment provided mild relief.  Fall The accident occurred more than 1 week ago (6 months ago). The fall occurred while standing (fell baclwards). She landed on hard floor. The point of impact was the buttocks. The pain is present in the back. The pain is moderate. The symptoms are aggravated by ambulation. Pertinent negatives include no abdominal pain, bowel incontinence, fever, headaches, hearing loss, hematuria, nausea, numbness, visual change or vomiting. She has tried acetaminophen for the symptoms.  Neurologic Problem The patient's pertinent negatives include no focal sensory loss, focal weakness, visual change or weakness. Primary symptoms comment: neuropathy. This is a new problem. The current episode started more than 1 month ago (6 months). The neurological problem developed suddenly. The problem has been waxing and waning (not aleiviating) since onset. Associated symptoms include back pain. Pertinent negatives include no abdominal pain, auditory change, aura, bladder incontinence, bowel incontinence, chest pain, confusion, diaphoresis, dizziness, fatigue, fever, headaches, light-headedness, nausea, neck pain, palpitations, shortness of breath, vertigo or vomiting. (Bilateral feet pain) The treatment provided moderate relief. There is no history of liver disease.    Lab Results  Component Value Date   CREATININE 1.83 (H) 01/02/2019   BUN 20 01/02/2019   NA 141 01/02/2019   K 4.9 01/02/2019   CL 102 01/02/2019   CO2 26 01/02/2019   Lab Results  Component Value Date   CHOL 170 05/04/2019   HDL 62 05/04/2019   LDLCALC 86 05/04/2019   TRIG 109 05/04/2019   CHOLHDL 2.8 03/02/2018   No results found for: TSH Lab Results  Component Value Date    HGBA1C 6.6 (H) 05/04/2019     Review of Systems  Constitutional: Negative.  Negative for chills, diaphoresis, fatigue, fever, irritability, malaise/fatigue, unexpected weight change and weight loss.  HENT: Negative for congestion, ear discharge, ear pain, rhinorrhea, sinus pressure, sneezing and sore throat.   Eyes: Negative for blurred vision, photophobia, pain, discharge, redness and itching.  Respiratory: Negative for cough, shortness of breath, wheezing and stridor.   Cardiovascular: Negative for chest pain, palpitations, orthopnea and PND.  Gastrointestinal: Negative for abdominal pain, blood in stool, bowel incontinence, constipation, diarrhea, nausea and vomiting.  Endocrine: Negative for cold intolerance, heat intolerance, polydipsia, polyphagia and polyuria.  Genitourinary: Negative for bladder incontinence, dysuria, flank pain, frequency, hematuria, impotence, menstrual problem, pelvic pain, urgency, vaginal bleeding and vaginal discharge.  Musculoskeletal: Positive for back pain. Negative for arthralgias, myalgias and neck pain.  Skin: Negative for rash.  Allergic/Immunologic: Negative for environmental allergies and food allergies.  Neurological: Negative for dizziness, vertigo, focal weakness, weakness, light-headedness, numbness, headaches and paresthesias.  Hematological: Negative for adenopathy. Does not bruise/bleed easily.  Psychiatric/Behavioral: Negative for confusion, decreased concentration, dysphoric mood and suicidal ideas. The patient is nervous/anxious. The patient does not have insomnia.     Patient Active Problem List   Diagnosis Date Noted  . Diabetes mellitus with no complication ([place]) 0000000  . Familial multiple lipoprotein-type hyperlipidemia 01/03/2015  . Creatinine elevation 01/03/2015  . Diabetes ([place]) 12/15/2014  . Hyperlipidemia 12/15/2014  . Hypertension 12/15/2014  . Gastro-esophageal reflux disease without esophagitis 12/15/2014    No  Known Allergies  Past Surgical History:  Procedure Laterality Date  . bladder tack    . BREAST BIOPSY Left 10-15 yrs ago   benign  . CATARACT EXTRACTION Bilateral 05/2015  . CHOLECYSTECTOMY OPEN    . VAGINAL HYSTERECTOMY      Social History   Tobacco Use  . Smoking status: Former Smoker  Packs/day: 0.25    Years: 25.00    Pack years: 6.25    Types: Cigarettes    Quit date: 92    Years since quitting: 39.0  . Smokeless tobacco: Never Used  . Tobacco comment: smoking cessation materials not required  Substance Use Topics  . Alcohol use: No    Alcohol/week: 0.0 standard drinks  . Drug use: No     Medication list has been reviewed and updated.  Current Meds  Medication Sig  . Acetaminophen (TYLENOL ARTHRITIS PAIN PO) Take 1 tablet by mouth daily.  Marland Kitchen ADVOCATE LANCETS MISC use once daily as directed to test blood sugar  . Blood Glucose Monitoring Suppl (FREESTYLE LITE) DEVI use once daily as directed to test blood sugar  . Cholecalciferol (VITAMIN D) 2000 units CAPS Take 1 capsule by mouth daily. otc  . Elderberry 575 MG/5ML SYRP Take by mouth.  Marland Kitchen FREESTYLE LITE test strip USE  STRIP TO CHECK GLUCOSE ONCE DAILY AS DIRECTED  . Lancet Devices (ADVOCATE LANCING DEVICE) MISC use once daily as directed to test blood sugar  . lansoprazole (PREVACID) 15 MG capsule Take 1 capsule (15 mg total) by mouth 1 day or 1 dose.  Marland Kitchen lisinopril-hydrochlorothiazide (PRINZIDE,ZESTORETIC) 20-12.5 MG tablet Take 1 tablet by mouth daily. Dr Holley Raring  . LORazepam (ATIVAN) 0.5 MG tablet Take 1 tablet (0.5 mg total) by mouth as needed.  . metFORMIN (GLUCOPHAGE) 500 MG tablet Take 1 tablet (500 mg total) by mouth 2 (two) times daily.  . Omega-3 Fatty Acids (FISH OIL) 1000 MG CAPS Take by mouth.  . simvastatin (ZOCOR) 20 MG tablet Take 1 tablet (20 mg total) by mouth daily at 6 PM.  . sitaGLIPtin (JANUVIA) 100 MG tablet Take 1 tablet (100 mg total) by mouth daily.    PHQ 2/9 Scores 09/11/2019  05/04/2019 02/13/2019 01/02/2019  PHQ - 2 Score 1 1 0 0  PHQ- 9 Score 2 1 - 0    BP Readings from Last 3 Encounters:  09/11/19 130/62  05/04/19 120/82  02/13/19 138/82    Physical Exam Vitals and nursing note reviewed.  Constitutional:      Appearance: She is well-developed.  HENT:     Head: Normocephalic.     Right Ear: External ear normal.     Left Ear: External ear normal.  Eyes:     General: Lids are everted, no foreign bodies appreciated. No scleral icterus.       Left eye: No foreign body or hordeolum.     Conjunctiva/sclera: Conjunctivae normal.     Right eye: Right conjunctiva is not injected.     Left eye: Left conjunctiva is not injected.     Pupils: Pupils are equal, round, and reactive to light.  Neck:     Thyroid: No thyromegaly.     Vascular: No JVD.     Trachea: No tracheal deviation.  Cardiovascular:     Rate and Rhythm: Normal rate and regular rhythm.     Pulses:          Dorsalis pedis pulses are 1+ on the right side and 1+ on the left side.       Posterior tibial pulses are 1+ on the right side and 1+ on the left side.     Heart sounds: Normal heart sounds. No murmur. No friction rub. No gallop.   Pulmonary:     Effort: Pulmonary effort is normal. No respiratory distress.     Breath sounds: Normal breath sounds. No wheezing  or rales.  Abdominal:     General: Bowel sounds are normal.     Palpations: Abdomen is soft. There is no mass.     Tenderness: There is no abdominal tenderness. There is no guarding or rebound.  Musculoskeletal:        General: No tenderness. Normal range of motion.     Cervical back: Normal range of motion and neck supple.     Thoracic back: Spasms present.     Lumbar back: Spasms present. Negative right straight leg raise test and negative left straight leg raise test.     Right foot: Bunion present.     Left foot: Bunion present.  Feet:     Right foot:     Protective Sensation: 10 sites tested. 10 sites sensed.     Skin  integrity: No ulcer, blister, skin breakdown, erythema, warmth, callus, dry skin or fissure.     Left foot:     Protective Sensation: 10 sites tested. 10 sites sensed.     Skin integrity: No ulcer, blister, skin breakdown, erythema, warmth, callus, dry skin or fissure.  Lymphadenopathy:     Cervical: No cervical adenopathy.  Skin:    General: Skin is warm.     Findings: No rash.  Neurological:     Mental Status: She is alert and oriented to person, place, and time.     Cranial Nerves: No cranial nerve deficit.     Deep Tendon Reflexes: Reflexes normal.  Psychiatric:        Mood and Affect: Mood is not anxious or depressed.     Wt Readings from Last 3 Encounters:  09/11/19 142 lb (64.4 kg)  05/04/19 140 lb (63.5 kg)  02/13/19 141 lb 6.4 oz (64.1 kg)    BP 130/62   Pulse 64   Ht 5\' 5"  (1.651 m)   Wt 142 lb (64.4 kg)   BMI 23.63 kg/m   Assessment and Plan:  1. Anxiety Patient on long-term treatment of anxiety with benzodiazepines started with previous physician.  We will continue with 0.5 mg as needed as needed. - LORazepam (ATIVAN) 0.5 MG tablet; Take 1 tablet (0.5 mg total) by mouth as needed.  Dispense: 30 tablet; Refill: 5  2. Diabetes mellitus with no complication (HCC) Chronic.  Controlled.  Stable.  Continue Januvia 100 mg once a day, Metformin 500 mg twice a day.  Will check microalbuminuria as well as hemoglobin A1c and renal function panel. - Microalbumin, urine - sitaGLIPtin (JANUVIA) 100 MG tablet; Take 1 tablet (100 mg total) by mouth daily.  Dispense: 90 tablet; Refill: 1 - metFORMIN (GLUCOPHAGE) 500 MG tablet; Take 1 tablet (500 mg total) by mouth 2 (two) times daily.  Dispense: 180 tablet; Refill: 1 - Hemoglobin A1c - Renal Function Panel  3. Essential hypertension Chronic.  Controlled.  Stable.  Continue lisinopril hydrochlorothiazide 20-12.5 mg. - lisinopril-hydrochlorothiazide (ZESTORETIC) 20-12.5 MG tablet; Take 1 tablet by mouth daily. Dr Holley Raring   Dispense: 90 tablet; Refill: 1 - Renal Function Panel  4. Gastro-esophageal reflux disease without esophagitis Chronic.  Controlled.  Stable.  Continue lansoprazole 15 mg once a day. - lansoprazole (PREVACID) 15 MG capsule; Take 1 capsule (15 mg total) by mouth 1 day or 1 dose.  Dispense: 90 capsule; Refill: 1  5. Mixed hyperlipidemia Chronic.  Controlled.  Stable.  Continue simvastatin 20 mg once a day. - simvastatin (ZOCOR) 20 MG tablet; Take 1 tablet (20 mg total) by mouth daily at 6 PM.  Dispense: 90  tablet; Refill: 1  6. Chronic midline low back pain without sciatica Patient with 34-month duration of lower back pain status post fall when she was in the dark and missed the handle of a doorknob and fell backwards hitting her backside.  Patient's had intermittent lumbar back pain since injury.  There is been no radiation of pain nor neurologic symptoms.  Will obtain lumbar x-ray to rule out compression fracture.  In the meantime we will suggest Tylenol on a as needed basis - DG Lumbar Spine Complete; Future  Review of x-ray notes mild to moderate facet predominant degenerative changes of the lower lumbar spine including an 9 mm anterior listhesis L4 on 5 and 4 mm anterior listhesis L5 on S1.  Vertebral body heights are maintained without evidence of acute fracture.  Will initiate analgesics with acetaminophen and if pain continues next step will be referral to orthopedics.

## 2019-09-12 LAB — MICROALBUMIN, URINE: Microalbumin, Urine: 22.6 ug/mL

## 2019-09-12 LAB — RENAL FUNCTION PANEL
Albumin: 4.5 g/dL (ref 3.6–4.6)
BUN/Creatinine Ratio: 16 (ref 12–28)
BUN: 24 mg/dL (ref 8–27)
CO2: 25 mmol/L (ref 20–29)
Calcium: 10 mg/dL (ref 8.7–10.3)
Chloride: 104 mmol/L (ref 96–106)
Creatinine, Ser: 1.54 mg/dL — ABNORMAL HIGH (ref 0.57–1.00)
GFR calc Af Amer: 35 mL/min/{1.73_m2} — ABNORMAL LOW (ref >59)
GFR calc non Af Amer: 30 mL/min/{1.73_m2} — ABNORMAL LOW (ref >59)
Glucose: 115 mg/dL — ABNORMAL HIGH (ref 65–99)
Phosphorus: 4.1 mg/dL (ref 3.0–4.3)
Potassium: 4.4 mmol/L (ref 3.5–5.2)
Sodium: 143 mmol/L (ref 134–144)

## 2019-09-12 LAB — HEMOGLOBIN A1C
Est. average glucose Bld gHb Est-mCnc: 137 mg/dL
Hgb A1c MFr Bld: 6.4 % — ABNORMAL HIGH (ref 4.8–5.6)

## 2019-09-16 ENCOUNTER — Other Ambulatory Visit: Payer: Self-pay

## 2019-09-16 ENCOUNTER — Encounter: Payer: Self-pay | Admitting: Emergency Medicine

## 2019-09-16 ENCOUNTER — Emergency Department
Admission: EM | Admit: 2019-09-16 | Discharge: 2019-09-16 | Disposition: A | Discharge status: Home / Self Care | Payer: PPO | Source: Home / Self Care | Attending: Emergency Medicine | Admitting: Emergency Medicine

## 2019-09-16 ENCOUNTER — Emergency Department: Payer: PPO

## 2019-09-16 DIAGNOSIS — Z87891 Personal history of nicotine dependence: Secondary | ICD-10-CM | POA: Insufficient documentation

## 2019-09-16 DIAGNOSIS — E119 Type 2 diabetes mellitus without complications: Secondary | ICD-10-CM | POA: Insufficient documentation

## 2019-09-16 DIAGNOSIS — I10 Essential (primary) hypertension: Secondary | ICD-10-CM | POA: Diagnosis not present

## 2019-09-16 DIAGNOSIS — U071 COVID-19: Secondary | ICD-10-CM | POA: Insufficient documentation

## 2019-09-16 DIAGNOSIS — R55 Syncope and collapse: Secondary | ICD-10-CM

## 2019-09-16 LAB — CBC
HCT: 35.1 % — ABNORMAL LOW (ref 36.0–46.0)
Hemoglobin: 11.5 g/dL — ABNORMAL LOW (ref 12.0–15.0)
MCH: 25.6 pg — ABNORMAL LOW (ref 26.0–34.0)
MCHC: 32.8 g/dL (ref 30.0–36.0)
MCV: 78.2 fL — ABNORMAL LOW (ref 80.0–100.0)
Platelets: 158 10*3/uL (ref 150–400)
RBC: 4.49 MIL/uL (ref 3.87–5.11)
RDW: 14.8 % (ref 11.5–15.5)
WBC: 6.6 10*3/uL (ref 4.0–10.5)
nRBC: 0 % (ref 0.0–0.2)

## 2019-09-16 LAB — RESPIRATORY PANEL BY RT PCR (FLU A&B, COVID)
Influenza A by PCR: NEGATIVE
Influenza B by PCR: NEGATIVE
SARS Coronavirus 2 by RT PCR: POSITIVE — AB

## 2019-09-16 LAB — BASIC METABOLIC PANEL
Anion gap: 13 (ref 5–15)
BUN: 21 mg/dL (ref 8–23)
CO2: 24 mmol/L (ref 22–32)
Calcium: 9 mg/dL (ref 8.9–10.3)
Chloride: 106 mmol/L (ref 98–111)
Creatinine, Ser: 1.61 mg/dL — ABNORMAL HIGH (ref 0.44–1.00)
GFR calc Af Amer: 33 mL/min — ABNORMAL LOW (ref >60)
GFR calc non Af Amer: 28 mL/min — ABNORMAL LOW (ref >60)
Glucose, Bld: 134 mg/dL — ABNORMAL HIGH (ref 70–99)
Potassium: 3.9 mmol/L (ref 3.5–5.1)
Sodium: 143 mmol/L (ref 135–145)

## 2019-09-16 LAB — TROPONIN I (HIGH SENSITIVITY)
Troponin I (High Sensitivity): 11 ng/L (ref <18)
Troponin I (High Sensitivity): 13 ng/L (ref <18)

## 2019-09-16 MED ORDER — IVERMECTIN 3 MG PO TABS: 200.0000 ug/kg | ORAL_TABLET | Freq: Once | ORAL | 0 refills | Status: AC

## 2019-09-16 MED ORDER — SODIUM CHLORIDE 0.9 % IV BOLUS
500.0000 mL | Freq: Once | INTRAVENOUS | Status: AC
  Administered 2019-09-16: 14:00:00 500 mL via INTRAVENOUS

## 2019-09-16 MED ORDER — ONDANSETRON HCL 4 MG/2ML IJ SOLN
4.0000 mg | Freq: Once | INTRAMUSCULAR | Status: AC
  Administered 2019-09-16: 12:00:00 4 mg via INTRAVENOUS
  Filled 2019-09-16: qty 2

## 2019-09-16 MED ORDER — SODIUM CHLORIDE 0.9 % IV BOLUS
500.0000 mL | Freq: Once | INTRAVENOUS | Status: AC
  Administered 2019-09-16: 500 mL via INTRAVENOUS

## 2019-09-16 NOTE — ED Notes (Signed)
Date and time results received: 09/16/19 1409  Test: COVID Critical Value: Positive  Name of Provider Notified: Dr. Jimmye Norman, RN

## 2019-09-16 NOTE — ED Provider Notes (Signed)
[facility] Emergency Department Provider Note       Time seen: ----------------------------------------- 11:07 AM on 09/16/2019 -----------------------------------------   I have reviewed the triage vital signs and the nursing notes.  HISTORY   Chief Complaint No chief complaint on file.    HPI Jordan Thornton is a 86 y.o. female with a history of diabetes, GERD, hyperlipidemia, hypertension who presents to the ED for syncope.  Patient states she went to the refrigerator to get some juice, was feeling nauseous and subsequently syncopized.  She has never had this happen before, nothing makes it better or worse.  EMS reports she was febrile.  Past Medical History:  Diagnosis Date  . Diabetes mellitus without complication ([place])   . GERD (gastroesophageal reflux disease)   . Hyperlipidemia   . Hypertension     Patient Active Problem List   Diagnosis Date Noted  . Diabetes mellitus with no complication ([place]) 0000000  . Familial multiple lipoprotein-type hyperlipidemia 01/03/2015  . Creatinine elevation 01/03/2015  . Diabetes ([place]) 12/15/2014  . Hyperlipidemia 12/15/2014  . Hypertension 12/15/2014  . Gastro-esophageal reflux disease without esophagitis 12/15/2014    Past Surgical History:  Procedure Laterality Date  . bladder tack    . BREAST BIOPSY Left 10-15 yrs ago   benign  . CATARACT EXTRACTION Bilateral 05/2015  . CHOLECYSTECTOMY OPEN    . VAGINAL HYSTERECTOMY      Allergies Patient has no known allergies.  Social History Social History   Tobacco Use  . Smoking status: Former Smoker    Packs/day: 0.25    Years: 25.00    Pack years: 6.25    Types: Cigarettes    Quit date: 1982    Years since quitting: 39.0  . Smokeless tobacco: Never Used  . Tobacco comment: smoking cessation materials not required  Substance Use Topics  . Alcohol use: No    Alcohol/week: 0.0 standard drinks  . Drug use: No    Review of  Systems Constitutional: Negative for fever. Cardiovascular: Negative for chest pain.  Positive for syncope Respiratory: Negative for shortness of breath. Gastrointestinal: Negative for abdominal pain, positive for nausea Musculoskeletal: Negative for back pain. Skin: Negative for rash. Neurological: Negative for headaches, focal weakness or numbness.  All systems negative/normal/unremarkable except as stated in the HPI  ____________________________________________   PHYSICAL EXAM:  VITAL SIGNS: ED Triage Vitals  Enc Vitals Group     BP      Pulse      Resp      Temp      Temp src      SpO2      Weight      Height      Head Circumference      Peak Flow      Pain Score      Pain Loc      Pain Edu?      Excl. in [place]?     Constitutional: Alert and oriented. Well appearing and in no distress. Eyes: Conjunctivae are normal. Normal extraocular movements. ENT      Head: Normocephalic and atraumatic.      Nose: No congestion/rhinnorhea.      Mouth/Throat: Mucous membranes are moist.      Neck: No stridor. Cardiovascular: Normal rate, regular rhythm. No murmurs, rubs, or gallops. Respiratory: Normal respiratory effort without tachypnea nor retractions. Breath sounds are clear and equal bilaterally. No wheezes/rales/rhonchi. Gastrointestinal: Soft and nontender. Normal bowel sounds Musculoskeletal: Nontender with normal range of motion  in extremities. No lower extremity tenderness nor edema. Neurologic:  Normal speech and language. No gross focal neurologic deficits are appreciated.  Skin:  Skin is warm, dry and intact. No rash noted. Psychiatric: Mood and affect are normal. Speech and behavior are normal.  ____________________________________________  EKG: Interpreted by me.  Sinus rhythm with rate of 78 bpm, right bundle branch block, left anterior fascicular block, normal QT  ____________________________________________  ED COURSE:  As part of my medical decision  making, I reviewed the following data within the [place] History obtained from family if available, nursing notes, old chart and ekg, as well as notes from prior ED visits. Patient presented for syncope, we will assess with labs and imaging as indicated at this time.   Procedures  Jordan McCoy Brazzel was evaluated in Emergency Department on 09/16/2019 for the symptoms described in the history of present illness. She was evaluated in the context of the global COVID-19 pandemic, which necessitated consideration that the patient might be at risk for infection with the SARS-CoV-2 virus that causes COVID-19. Institutional protocols and algorithms that pertain to the evaluation of patients at risk for COVID-19 are in a state of rapid change based on information released by regulatory bodies including the CDC and federal and state organizations. These policies and algorithms were followed during the patient's care in the ED.  ____________________________________________   LABS (pertinent positives/negatives)  Labs Reviewed  RESPIRATORY PANEL BY RT PCR (FLU A&B, COVID) - Abnormal; Notable for the following components:      Result Value   SARS Coronavirus 2 by RT PCR POSITIVE (*)    All other components within normal limits  BASIC METABOLIC PANEL - Abnormal; Notable for the following components:   Glucose, Bld 134 (*)    Creatinine, Ser 1.61 (*)    GFR calc non Af Amer 28 (*)    GFR calc Af Amer 33 (*)    All other components within normal limits  CBC - Abnormal; Notable for the following components:   Hemoglobin 11.5 (*)    HCT 35.1 (*)    MCV 78.2 (*)    MCH 25.6 (*)    All other components within normal limits  TROPONIN I (HIGH SENSITIVITY)  TROPONIN I (HIGH SENSITIVITY)    RADIOLOGY Images were viewed by me  Chest x-ray  IMPRESSION:  No active disease.  ____________________________________________   DIFFERENTIAL DIAGNOSIS   Orthostasis, dehydration,  electrolyte abnormality, MI, COVID-19  FINAL ASSESSMENT AND PLAN  Syncope, COVID-19   Plan: The patient had presented for syncope. Patient's labs were grossly unremarkable with the exception of coronavirus positivity. Patient's imaging did not reveal any acute process.  She was given fluids and currently feels better.  She otherwise has stable vital signs and is cleared for outpatient follow-up.   Laurence Aly, MD    Note: This note was generated in part or whole with voice recognition software. Voice recognition is usually quite accurate but there are transcription errors that can and very often do occur. I apologize for any typographical errors that were not detected and corrected.     Earleen Newport, MD 09/16/19 [phone]

## 2019-09-16 NOTE — ED Triage Notes (Signed)
Pt via EMS from home. Pt states she got up around 0800 this am and she woke up and she was on the floor. Pt does not remember how long she was out for and does not remember if she hit her head. Pt is A&Ox4 and NAD. Denies taking blood thinners.

## 2019-09-17 ENCOUNTER — Telehealth: Payer: Self-pay | Admitting: Unknown Physician Specialty

## 2019-09-17 NOTE — Telephone Encounter (Signed)
Called to discuss with patient about Covid symptoms and the use of bamlanivimab, a monoclonal antibody infusion for those with mild to moderate Covid symptoms and at a high risk of hospitalization.  Pt is qualified for this infusion at the [facility] infusion center due to Age > 40   Unable to reach patient.  Mychart message sent

## 2019-09-18 ENCOUNTER — Other Ambulatory Visit: Payer: Self-pay

## 2019-09-18 DIAGNOSIS — I4510 Unspecified right bundle-branch block: Secondary | ICD-10-CM | POA: Diagnosis present

## 2019-09-18 DIAGNOSIS — E1165 Type 2 diabetes mellitus with hyperglycemia: Secondary | ICD-10-CM | POA: Diagnosis present

## 2019-09-18 DIAGNOSIS — E785 Hyperlipidemia, unspecified: Secondary | ICD-10-CM | POA: Diagnosis present

## 2019-09-18 DIAGNOSIS — Z79899 Other long term (current) drug therapy: Secondary | ICD-10-CM

## 2019-09-18 DIAGNOSIS — U071 COVID-19: Secondary | ICD-10-CM | POA: Diagnosis not present

## 2019-09-18 DIAGNOSIS — D631 Anemia in chronic kidney disease: Secondary | ICD-10-CM | POA: Diagnosis present

## 2019-09-18 DIAGNOSIS — I129 Hypertensive chronic kidney disease with stage 1 through stage 4 chronic kidney disease, or unspecified chronic kidney disease: Secondary | ICD-10-CM | POA: Diagnosis present

## 2019-09-18 DIAGNOSIS — Z9842 Cataract extraction status, left eye: Secondary | ICD-10-CM

## 2019-09-18 DIAGNOSIS — I444 Left anterior fascicular block: Secondary | ICD-10-CM | POA: Diagnosis present

## 2019-09-18 DIAGNOSIS — Z87891 Personal history of nicotine dependence: Secondary | ICD-10-CM

## 2019-09-18 DIAGNOSIS — K219 Gastro-esophageal reflux disease without esophagitis: Secondary | ICD-10-CM | POA: Diagnosis present

## 2019-09-18 DIAGNOSIS — R112 Nausea with vomiting, unspecified: Secondary | ICD-10-CM | POA: Diagnosis not present

## 2019-09-18 DIAGNOSIS — E860 Dehydration: Secondary | ICD-10-CM | POA: Diagnosis present

## 2019-09-18 DIAGNOSIS — J1282 Pneumonia due to coronavirus disease 2019: Secondary | ICD-10-CM | POA: Diagnosis present

## 2019-09-18 DIAGNOSIS — E1122 Type 2 diabetes mellitus with diabetic chronic kidney disease: Secondary | ICD-10-CM | POA: Diagnosis present

## 2019-09-18 DIAGNOSIS — N184 Chronic kidney disease, stage 4 (severe): Secondary | ICD-10-CM | POA: Diagnosis present

## 2019-09-18 DIAGNOSIS — I10 Essential (primary) hypertension: Secondary | ICD-10-CM | POA: Diagnosis not present

## 2019-09-18 DIAGNOSIS — I248 Other forms of acute ischemic heart disease: Secondary | ICD-10-CM | POA: Diagnosis present

## 2019-09-18 DIAGNOSIS — Z90710 Acquired absence of both cervix and uterus: Secondary | ICD-10-CM

## 2019-09-18 DIAGNOSIS — R0602 Shortness of breath: Secondary | ICD-10-CM | POA: Diagnosis not present

## 2019-09-18 DIAGNOSIS — Z823 Family history of stroke: Secondary | ICD-10-CM

## 2019-09-18 DIAGNOSIS — Z8249 Family history of ischemic heart disease and other diseases of the circulatory system: Secondary | ICD-10-CM

## 2019-09-18 DIAGNOSIS — Z7984 Long term (current) use of oral hypoglycemic drugs: Secondary | ICD-10-CM

## 2019-09-18 DIAGNOSIS — Z9841 Cataract extraction status, right eye: Secondary | ICD-10-CM

## 2019-09-18 DIAGNOSIS — I071 Rheumatic tricuspid insufficiency: Secondary | ICD-10-CM | POA: Diagnosis present

## 2019-09-18 DIAGNOSIS — J9601 Acute respiratory failure with hypoxia: Secondary | ICD-10-CM | POA: Diagnosis present

## 2019-09-18 DIAGNOSIS — R110 Nausea: Secondary | ICD-10-CM | POA: Diagnosis not present

## 2019-09-18 LAB — COMPREHENSIVE METABOLIC PANEL
ALT: 20 U/L (ref 0–44)
AST: 37 U/L (ref 15–41)
Albumin: 4 g/dL (ref 3.5–5.0)
Alkaline Phosphatase: 41 U/L (ref 38–126)
Anion gap: 13 (ref 5–15)
BUN: 24 mg/dL — ABNORMAL HIGH (ref 8–23)
CO2: 24 mmol/L (ref 22–32)
Calcium: 8.5 mg/dL — ABNORMAL LOW (ref 8.9–10.3)
Chloride: 102 mmol/L (ref 98–111)
Creatinine, Ser: 1.65 mg/dL — ABNORMAL HIGH (ref 0.44–1.00)
GFR calc Af Amer: 32 mL/min — ABNORMAL LOW (ref >60)
GFR calc non Af Amer: 28 mL/min — ABNORMAL LOW (ref >60)
Glucose, Bld: 213 mg/dL — ABNORMAL HIGH (ref 70–99)
Potassium: 3.8 mmol/L (ref 3.5–5.1)
Sodium: 139 mmol/L (ref 135–145)
Total Bilirubin: 1 mg/dL (ref 0.3–1.2)
Total Protein: 7.6 g/dL (ref 6.5–8.1)

## 2019-09-18 LAB — CBC
HCT: 35.6 % — ABNORMAL LOW (ref 36.0–46.0)
Hemoglobin: 11.7 g/dL — ABNORMAL LOW (ref 12.0–15.0)
MCH: 25.5 pg — ABNORMAL LOW (ref 26.0–34.0)
MCHC: 32.9 g/dL (ref 30.0–36.0)
MCV: 77.6 fL — ABNORMAL LOW (ref 80.0–100.0)
Platelets: 154 10*3/uL (ref 150–400)
RBC: 4.59 MIL/uL (ref 3.87–5.11)
RDW: 14.6 % (ref 11.5–15.5)
WBC: 5.6 10*3/uL (ref 4.0–10.5)
nRBC: 0 % (ref 0.0–0.2)

## 2019-09-18 LAB — LIPASE, BLOOD: Lipase: 36 U/L (ref 11–51)

## 2019-09-18 NOTE — ED Triage Notes (Signed)
Pt to ED POV from home. Pt states vomiting and fever since saturday. Pt denies diarrhea. Pt A&Ox4. Pt denies any medicine administration prior to arrival. Pt states Saturday tested COVID positive.

## 2019-09-19 ENCOUNTER — Inpatient Hospital Stay
Admit: 2019-09-19 | Discharge: 2019-09-19 | Disposition: A | Discharge status: Home / Self Care | Payer: PPO | Attending: Family Medicine | Admitting: Family Medicine

## 2019-09-19 ENCOUNTER — Inpatient Hospital Stay
Admission: EM | Admit: 2019-09-19 | Discharge: 2019-09-21 | DRG: 177 | Disposition: A | Discharge status: Home / Self Care | Payer: PPO | Attending: Hospitalist | Admitting: Hospitalist

## 2019-09-19 ENCOUNTER — Other Ambulatory Visit: Payer: Self-pay

## 2019-09-19 ENCOUNTER — Emergency Department: Payer: PPO

## 2019-09-19 DIAGNOSIS — J9601 Acute respiratory failure with hypoxia: Secondary | ICD-10-CM | POA: Diagnosis not present

## 2019-09-19 DIAGNOSIS — R1110 Vomiting, unspecified: Secondary | ICD-10-CM | POA: Diagnosis not present

## 2019-09-19 DIAGNOSIS — I4510 Unspecified right bundle-branch block: Secondary | ICD-10-CM | POA: Diagnosis not present

## 2019-09-19 DIAGNOSIS — J9690 Respiratory failure, unspecified, unspecified whether with hypoxia or hypercapnia: Secondary | ICD-10-CM | POA: Diagnosis not present

## 2019-09-19 DIAGNOSIS — R112 Nausea with vomiting, unspecified: Secondary | ICD-10-CM

## 2019-09-19 DIAGNOSIS — R0902 Hypoxemia: Secondary | ICD-10-CM | POA: Diagnosis not present

## 2019-09-19 DIAGNOSIS — U071 COVID-19: Secondary | ICD-10-CM | POA: Diagnosis not present

## 2019-09-19 DIAGNOSIS — R778 Other specified abnormalities of plasma proteins: Secondary | ICD-10-CM

## 2019-09-19 DIAGNOSIS — N186 End stage renal disease: Secondary | ICD-10-CM

## 2019-09-19 DIAGNOSIS — Z79899 Other long term (current) drug therapy: Secondary | ICD-10-CM | POA: Diagnosis not present

## 2019-09-19 DIAGNOSIS — Z7984 Long term (current) use of oral hypoglycemic drugs: Secondary | ICD-10-CM | POA: Diagnosis not present

## 2019-09-19 DIAGNOSIS — Z823 Family history of stroke: Secondary | ICD-10-CM | POA: Diagnosis not present

## 2019-09-19 DIAGNOSIS — I129 Hypertensive chronic kidney disease with stage 1 through stage 4 chronic kidney disease, or unspecified chronic kidney disease: Secondary | ICD-10-CM | POA: Diagnosis not present

## 2019-09-19 DIAGNOSIS — J1282 Pneumonia due to coronavirus disease 2019: Secondary | ICD-10-CM

## 2019-09-19 DIAGNOSIS — D631 Anemia in chronic kidney disease: Secondary | ICD-10-CM | POA: Diagnosis not present

## 2019-09-19 DIAGNOSIS — Z8249 Family history of ischemic heart disease and other diseases of the circulatory system: Secondary | ICD-10-CM | POA: Diagnosis not present

## 2019-09-19 DIAGNOSIS — R509 Fever, unspecified: Secondary | ICD-10-CM | POA: Diagnosis not present

## 2019-09-19 DIAGNOSIS — Z992 Dependence on renal dialysis: Secondary | ICD-10-CM

## 2019-09-19 DIAGNOSIS — I10 Essential (primary) hypertension: Secondary | ICD-10-CM | POA: Diagnosis not present

## 2019-09-19 DIAGNOSIS — E1122 Type 2 diabetes mellitus with diabetic chronic kidney disease: Secondary | ICD-10-CM | POA: Diagnosis not present

## 2019-09-19 DIAGNOSIS — Z9841 Cataract extraction status, right eye: Secondary | ICD-10-CM | POA: Diagnosis not present

## 2019-09-19 DIAGNOSIS — I444 Left anterior fascicular block: Secondary | ICD-10-CM | POA: Diagnosis not present

## 2019-09-19 DIAGNOSIS — Z90710 Acquired absence of both cervix and uterus: Secondary | ICD-10-CM | POA: Diagnosis not present

## 2019-09-19 DIAGNOSIS — R7989 Other specified abnormal findings of blood chemistry: Secondary | ICD-10-CM | POA: Diagnosis not present

## 2019-09-19 DIAGNOSIS — E1165 Type 2 diabetes mellitus with hyperglycemia: Secondary | ICD-10-CM | POA: Diagnosis not present

## 2019-09-19 DIAGNOSIS — Z87891 Personal history of nicotine dependence: Secondary | ICD-10-CM | POA: Diagnosis not present

## 2019-09-19 DIAGNOSIS — K219 Gastro-esophageal reflux disease without esophagitis: Secondary | ICD-10-CM | POA: Diagnosis not present

## 2019-09-19 DIAGNOSIS — Z9842 Cataract extraction status, left eye: Secondary | ICD-10-CM | POA: Diagnosis not present

## 2019-09-19 DIAGNOSIS — E785 Hyperlipidemia, unspecified: Secondary | ICD-10-CM | POA: Diagnosis not present

## 2019-09-19 DIAGNOSIS — E860 Dehydration: Secondary | ICD-10-CM | POA: Diagnosis not present

## 2019-09-19 DIAGNOSIS — I071 Rheumatic tricuspid insufficiency: Secondary | ICD-10-CM | POA: Diagnosis not present

## 2019-09-19 DIAGNOSIS — N184 Chronic kidney disease, stage 4 (severe): Secondary | ICD-10-CM | POA: Diagnosis not present

## 2019-09-19 DIAGNOSIS — I248 Other forms of acute ischemic heart disease: Secondary | ICD-10-CM | POA: Diagnosis not present

## 2019-09-19 LAB — GLUCOSE, CAPILLARY
Glucose-Capillary: 156 mg/dL — ABNORMAL HIGH (ref 70–99)
Glucose-Capillary: 194 mg/dL — ABNORMAL HIGH (ref 70–99)
Glucose-Capillary: 153 mg/dL — ABNORMAL HIGH (ref 70–99)
Glucose-Capillary: 182 mg/dL — ABNORMAL HIGH (ref 70–99)
Glucose-Capillary: 116 mg/dL — ABNORMAL HIGH (ref 70–99)

## 2019-09-19 LAB — ABO/RH: ABO/RH(D): A POS

## 2019-09-19 LAB — PROCALCITONIN: Procalcitonin: 2.11 ng/mL

## 2019-09-19 LAB — C-REACTIVE PROTEIN: CRP: 15.4 mg/dL — ABNORMAL HIGH

## 2019-09-19 LAB — TROPONIN I (HIGH SENSITIVITY)
Troponin I (High Sensitivity): 258 ng/L (ref <18)
Troponin I (High Sensitivity): 247 ng/L (ref <18)

## 2019-09-19 LAB — INFLUENZA PANEL BY PCR (TYPE A & B)
Influenza A By PCR: NEGATIVE
Influenza B By PCR: NEGATIVE

## 2019-09-19 LAB — PROTIME-INR
INR: 1.1 (ref 0.8–1.2)
Prothrombin Time: 13.7 seconds (ref 11.4–15.2)

## 2019-09-19 LAB — HEPARIN LEVEL (UNFRACTIONATED)
Heparin Unfractionated: 1.01 [IU]/mL — ABNORMAL HIGH (ref 0.30–0.70)
Heparin Unfractionated: 0.79 IU/mL — ABNORMAL HIGH (ref 0.30–0.70)

## 2019-09-19 LAB — LACTIC ACID, PLASMA: Lactic Acid, Venous: 1.5 mmol/L (ref 0.5–1.9)

## 2019-09-19 LAB — FERRITIN: Ferritin: 299 ng/mL (ref 11–307)

## 2019-09-19 LAB — APTT: aPTT: 40 s — ABNORMAL HIGH (ref 24–36)

## 2019-09-19 LAB — D-DIMER, QUANTITATIVE: D-Dimer, Quant: 2.57 ug{FEU}/mL — ABNORMAL HIGH (ref 0.00–0.50)

## 2019-09-19 LAB — ECHOCARDIOGRAM COMPLETE

## 2019-09-19 LAB — FIBRINOGEN: Fibrinogen: 548 mg/dL — ABNORMAL HIGH (ref 210–475)

## 2019-09-19 LAB — LACTATE DEHYDROGENASE: LDH: 218 U/L — ABNORMAL HIGH (ref 98–192)

## 2019-09-19 LAB — TRIGLYCERIDES: Triglycerides: 94 mg/dL

## 2019-09-19 MED ORDER — INSULIN ASPART 100 UNIT/ML ~~LOC~~ SOLN
0.0000 [IU] | SUBCUTANEOUS | Status: DC
  Administered 2019-09-19 (×3): 4 [IU] via SUBCUTANEOUS
  Filled 2019-09-19 (×4): qty 1

## 2019-09-19 MED ORDER — ONDANSETRON HCL 4 MG/2ML IJ SOLN
4.0000 mg | Freq: Once | INTRAMUSCULAR | Status: AC
  Administered 2019-09-19: 4 mg via INTRAVENOUS
  Filled 2019-09-19: qty 2

## 2019-09-19 MED ORDER — OMEGA-3-ACID ETHYL ESTERS 1 G PO CAPS
1.0000 g | ORAL_CAPSULE | Freq: Every day | ORAL | Status: DC
  Administered 2019-09-19 – 2019-09-21 (×3): 1 g via ORAL
  Filled 2019-09-19 (×3): qty 1

## 2019-09-19 MED ORDER — PANTOPRAZOLE SODIUM 20 MG PO TBEC
20.0000 mg | DELAYED_RELEASE_TABLET | Freq: Every day | ORAL | Status: DC
  Administered 2019-09-19 – 2019-09-21 (×3): 20 mg via ORAL
  Filled 2019-09-19 (×3): qty 1

## 2019-09-19 MED ORDER — SODIUM CHLORIDE 0.9 % IV SOLN
500.0000 mg | INTRAVENOUS | Status: DC
  Administered 2019-09-19 – 2019-09-21 (×3): 500 mg via INTRAVENOUS
  Filled 2019-09-19 (×3): qty 500

## 2019-09-19 MED ORDER — SODIUM CHLORIDE 0.9 % IV SOLN: 100.0000 mg | Freq: Every day | INTRAVENOUS | Status: DC

## 2019-09-19 MED ORDER — VITAMIN D 25 MCG (1000 UNIT) PO TABS
2000.0000 [IU] | ORAL_TABLET | Freq: Every day | ORAL | Status: DC
  Administered 2019-09-19 – 2019-09-21 (×3): 2000 [IU] via ORAL
  Filled 2019-09-19 (×3): qty 2

## 2019-09-19 MED ORDER — HYDROCOD POLST-CPM POLST ER 10-8 MG/5ML PO SUER: 5.0000 mL | Freq: Two times a day (BID) | ORAL | Status: DC | PRN

## 2019-09-19 MED ORDER — DEXAMETHASONE SODIUM PHOSPHATE 10 MG/ML IJ SOLN: 6.0000 mg | INTRAMUSCULAR | Status: DC

## 2019-09-19 MED ORDER — SODIUM CHLORIDE 0.9 % IV SOLN: INTRAVENOUS | Status: DC

## 2019-09-19 MED ORDER — ACETAMINOPHEN 325 MG PO TABS: 650.0000 mg | ORAL_TABLET | Freq: Four times a day (QID) | ORAL | Status: DC | PRN

## 2019-09-19 MED ORDER — SIMVASTATIN 20 MG PO TABS
20.0000 mg | ORAL_TABLET | Freq: Every day | ORAL | Status: DC
  Administered 2019-09-19 – 2019-09-20 (×2): 20 mg via ORAL
  Filled 2019-09-19 (×2): qty 1

## 2019-09-19 MED ORDER — HEPARIN (PORCINE) 25000 UT/250ML-% IV SOLN
450.0000 [IU]/h | INTRAVENOUS | Status: DC
  Administered 2019-09-19: 17:00:00 600 [IU]/h via INTRAVENOUS

## 2019-09-19 MED ORDER — GUAIFENESIN ER 600 MG PO TB12
600.0000 mg | ORAL_TABLET | Freq: Two times a day (BID) | ORAL | Status: DC
  Administered 2019-09-19 – 2019-09-21 (×5): 600 mg via ORAL
  Filled 2019-09-19 (×6): qty 1

## 2019-09-19 MED ORDER — HEPARIN (PORCINE) 25000 UT/250ML-% IV SOLN
750.0000 [IU]/h | INTRAVENOUS | Status: DC
  Administered 2019-09-19: 750 [IU]/h via INTRAVENOUS
  Filled 2019-09-19: qty 250

## 2019-09-19 MED ORDER — LINAGLIPTIN 5 MG PO TABS
5.0000 mg | ORAL_TABLET | Freq: Every day | ORAL | Status: DC
  Administered 2019-09-19 – 2019-09-21 (×3): 5 mg via ORAL
  Filled 2019-09-19 (×3): qty 1

## 2019-09-19 MED ORDER — SODIUM CHLORIDE 0.9 % IV SOLN
100.0000 mg | Freq: Every day | INTRAVENOUS | Status: DC
  Administered 2019-09-20 – 2019-09-21 (×2): 100 mg via INTRAVENOUS
  Filled 2019-09-19 (×2): qty 20

## 2019-09-19 MED ORDER — SODIUM CHLORIDE 0.9 % IV SOLN: 200.0000 mg | Freq: Once | INTRAVENOUS | Status: DC

## 2019-09-19 MED ORDER — FAMOTIDINE 20 MG PO TABS
20.0000 mg | ORAL_TABLET | Freq: Every day | ORAL | Status: DC
  Administered 2019-09-20 – 2019-09-21 (×2): 20 mg via ORAL
  Filled 2019-09-19 (×2): qty 1

## 2019-09-19 MED ORDER — ACETAMINOPHEN 650 MG RE SUPP
975.0000 mg | Freq: Once | RECTAL | Status: AC
  Administered 2019-09-19: 975 mg via RECTAL
  Filled 2019-09-19: qty 1

## 2019-09-19 MED ORDER — LORAZEPAM 0.5 MG PO TABS: 0.5000 mg | ORAL_TABLET | ORAL | Status: DC | PRN

## 2019-09-19 MED ORDER — ONDANSETRON HCL 4 MG/2ML IJ SOLN: 4.0000 mg | Freq: Four times a day (QID) | INTRAMUSCULAR | Status: DC | PRN

## 2019-09-19 MED ORDER — ASPIRIN EC 81 MG PO TBEC
81.0000 mg | DELAYED_RELEASE_TABLET | Freq: Every day | ORAL | Status: DC
  Administered 2019-09-19 – 2019-09-21 (×3): 81 mg via ORAL
  Filled 2019-09-19 (×3): qty 1

## 2019-09-19 MED ORDER — SODIUM CHLORIDE 0.9 % IV BOLUS
1000.0000 mL | Freq: Once | INTRAVENOUS | Status: AC
  Administered 2019-09-19: 1000 mL via INTRAVENOUS

## 2019-09-19 MED ORDER — ASCORBIC ACID 500 MG PO TABS
500.0000 mg | ORAL_TABLET | Freq: Every day | ORAL | Status: DC
  Administered 2019-09-19 – 2019-09-21 (×3): 500 mg via ORAL
  Filled 2019-09-19 (×2): qty 1

## 2019-09-19 MED ORDER — HEPARIN BOLUS VIA INFUSION
3500.0000 [IU] | Freq: Once | INTRAVENOUS | Status: AC
  Administered 2019-09-19: 3500 [IU] via INTRAVENOUS
  Filled 2019-09-19: qty 3500

## 2019-09-19 MED ORDER — MAGNESIUM HYDROXIDE 400 MG/5ML PO SUSP
30.0000 mL | Freq: Every day | ORAL | Status: DC | PRN
  Administered 2019-09-20: 30 mL via ORAL
  Filled 2019-09-19 (×2): qty 30

## 2019-09-19 MED ORDER — SODIUM CHLORIDE 0.9 % IV SOLN
200.0000 mg | Freq: Once | INTRAVENOUS | Status: AC
  Administered 2019-09-19: 200 mg via INTRAVENOUS
  Filled 2019-09-19: qty 200

## 2019-09-19 MED ORDER — SODIUM CHLORIDE 0.9 % IV SOLN
2.0000 g | INTRAVENOUS | Status: DC
  Administered 2019-09-19 – 2019-09-21 (×3): 2 g via INTRAVENOUS
  Filled 2019-09-19 (×3): qty 20

## 2019-09-19 MED ORDER — GUAIFENESIN-DM 100-10 MG/5ML PO SYRP
10.0000 mL | ORAL_SOLUTION | ORAL | Status: DC | PRN
  Filled 2019-09-19: qty 10

## 2019-09-19 MED ORDER — TRAZODONE HCL 50 MG PO TABS
25.0000 mg | ORAL_TABLET | Freq: Every evening | ORAL | Status: DC | PRN
  Administered 2019-09-19: 25 mg via ORAL
  Filled 2019-09-19: qty 1

## 2019-09-19 MED ORDER — ONDANSETRON HCL 4 MG PO TABS: 4.0000 mg | ORAL_TABLET | Freq: Four times a day (QID) | ORAL | Status: DC | PRN

## 2019-09-19 MED ORDER — LABETALOL HCL 5 MG/ML IV SOLN: 20.0000 mg | INTRAVENOUS | Status: DC | PRN

## 2019-09-19 MED ORDER — DEXAMETHASONE SODIUM PHOSPHATE 10 MG/ML IJ SOLN
6.0000 mg | INTRAMUSCULAR | Status: DC
  Administered 2019-09-19 – 2019-09-21 (×3): 6 mg via INTRAVENOUS
  Filled 2019-09-19 (×3): qty 1

## 2019-09-19 MED ORDER — ZINC SULFATE 220 (50 ZN) MG PO CAPS
220.0000 mg | ORAL_CAPSULE | Freq: Every day | ORAL | Status: DC
  Administered 2019-09-19 – 2019-09-21 (×3): 220 mg via ORAL
  Filled 2019-09-19 (×3): qty 1

## 2019-09-19 MED ORDER — FAMOTIDINE 20 MG PO TABS
20.0000 mg | ORAL_TABLET | Freq: Two times a day (BID) | ORAL | Status: DC
  Administered 2019-09-19: 20 mg via ORAL
  Filled 2019-09-19: qty 1

## 2019-09-19 NOTE — Consult Note (Signed)
CARDIOLOGY CONSULT NOTE               Patient ID: Jordan Thornton MRN: [id number] DOB/AGE: 21-Feb-1930 86 y.o.  Admit date: 09/19/2019 Referring Physician Dr. Normand Sloop hospitalist Primary Physician Dr. Otilio Miu Primary Cardiologist Dr Clayborn Bigness Reason for Consultation borderline troponin respiratory failure COVID-19  HPI: Patient is a 86 year old black female history of diabetes GERD dyslipidemia hypertension renal insufficiency poorly developed Covid over the past few days has gotten progressively worse with shortness of breath cough sputum production fever and chills.  Denies any GI type symptoms denies any wheezing did not lose her taste or smell but had an episode of syncope at home when the patient presented to emergency room she with elevated blood pressure with a temperature of 102.7 she had gotten Tylenol at home but her oximetry was 87 on room air she was placed on 2 L patient had borderline elevated high sensitive troponin of 250 with a Covid positive situation and abnormal chest x-ray with hypoxemia the patient was admitted for steroid therapy as well as broad-spectrum antibiotic management.  Denies any significant chest pain no significant previous cardiac history  Review of systems complete and found to be negative unless listed above     Past Medical History:  Diagnosis Date  . Diabetes mellitus without complication ([place])   . GERD (gastroesophageal reflux disease)   . Hyperlipidemia   . Hypertension     Past Surgical History:  Procedure Laterality Date  . bladder tack    . BREAST BIOPSY Left 10-15 yrs ago   benign  . CATARACT EXTRACTION Bilateral 05/2015  . CHOLECYSTECTOMY OPEN    . VAGINAL HYSTERECTOMY      (Not in a hospital admission)  Social History   Socioeconomic History  . Marital status: Married    Spouse name: Not on file  . Number of children: 2  . Years of education: Not on file  . Highest education level: Bachelor's degree (e.g., BA, AB,  BS)  Occupational History    Employer: RETIRED  Tobacco Use  . Smoking status: Former Smoker    Packs/day: 0.25    Years: 25.00    Pack years: 6.25    Types: Cigarettes    Quit date: 1982    Years since quitting: 39.0  . Smokeless tobacco: Never Used  . Tobacco comment: smoking cessation materials not required  Substance and Sexual Activity  . Alcohol use: No    Alcohol/week: 0.0 standard drinks  . Drug use: No  . Sexual activity: Not Currently  Other Topics Concern  . Not on file  Social History Narrative  . Not on file   Social Determinants of Health   Financial Resource Strain:   . Difficulty of Paying Living Expenses: Not on file  Food Insecurity:   . Worried About Charity fundraiser in the Last Year: Not on file  . Ran Out of Food in the Last Year: Not on file  Transportation Needs:   . Lack of Transportation (Medical): Not on file  . Lack of Transportation (Non-Medical): Not on file  Physical Activity:   . Days of Exercise per Week: Not on file  . Minutes of Exercise per Session: Not on file  Stress: No Stress Concern Present  . Feeling of Stress : Only a little  Social Connections: Unknown  . Frequency of Communication with Friends and Family: More than three times a week  . Frequency of Social Gatherings with Friends and Family: Three times  a week  . Attends Religious Services: More than 4 times per year  . Active Member of Clubs or Organizations: No  . Attends Archivist Meetings: Never  . Marital Status: Not on file  Intimate Partner Violence:   . Fear of Current or Ex-Partner: Not on file  . Emotionally Abused: Not on file  . Physically Abused: Not on file  . Sexually Abused: Not on file    Family History  Problem Relation Age of Onset  . Heart disease Maternal Aunt   . Heart disease Maternal Grandmother   . Stroke Father   . Breast cancer Neg Hx       Review of systems complete and found to be negative unless listed above       PHYSICAL EXAM  General: Well developed, well nourished, in no acute distress HEENT:  Normocephalic and atramatic Neck:  No JVD.  Lungs: Diminished bilaterally to auscultation and percussion. Heart: HRRR . Normal S1 and S2 without gallops or murmurs.  Abdomen: Bowel sounds are positive, abdomen soft and non-tender  Msk:  Back normal, normal gait. Normal strength and tone for age. Extremities: No clubbing, cyanosis or edema.   Neuro: Alert and oriented X 3. Psych:  Good affect, responds appropriately  Labs:   Lab Results  Component Value Date   WBC 5.6 09/18/2019   HGB 11.7 (L) 09/18/2019   HCT 35.6 (L) 09/18/2019   MCV 77.6 (L) 09/18/2019   PLT 154 09/18/2019    Recent Labs  Lab 09/18/19 1916  NA 139  K 3.8  CL 102  CO2 24  BUN 24*  CREATININE 1.65*  CALCIUM 8.5*  PROT 7.6  BILITOT 1.0  ALKPHOS 41  ALT 20  AST 37  GLUCOSE 213*   No results found for: CKTOTAL, CKMB, CKMBINDEX, TROPONINI  Lab Results  Component Value Date   CHOL 170 05/04/2019   CHOL 203 (H) 01/02/2019   CHOL 170 09/02/2018   Lab Results  Component Value Date   HDL 62 05/04/2019   HDL 62 01/02/2019   HDL 57 09/02/2018   Lab Results  Component Value Date   LDLCALC 86 05/04/2019   LDLCALC 111 (H) 01/02/2019   LDLCALC 90 09/02/2018   Lab Results  Component Value Date   TRIG 94 09/19/2019   TRIG 109 05/04/2019   TRIG 150 (H) 01/02/2019   Lab Results  Component Value Date   CHOLHDL 2.8 03/02/2018   CHOLHDL 2.7 02/25/2017   CHOLHDL 2.6 02/19/2016   No results found for: LDLDIRECT    Radiology: DG Lumbar Spine Complete  Result Date: 09/11/2019 CLINICAL DATA:  Back pain after fall 6 months ago EXAM: LUMBAR SPINE - COMPLETE 4+ VIEW COMPARISON:  None. FINDINGS: 9 mm anterolisthesis L4 on L5. 4 mm anterolisthesis L5 on S1. Vertebral body heights are maintained without evidence of fracture. Mild lower lumbar disc space narrowing. Moderate facet arthropathy of the lower lumbar  spine. Osseous structures appear demineralized. There is aortoiliac atherosclerotic calcifications. IMPRESSION: 1. Mild-to-moderate facet predominant degenerative changes of the lower lumbar spine including 9 mm anterolisthesis L4 on L5 and 4 mm anterolisthesis L5 on S1. 2. Vertebral body heights are maintained without evidence of acute fracture. Electronically Signed   By: Davina Poke D.O.   On: 09/11/2019 10:47   DG Chest Port 1 View  Result Date: 09/19/2019 CLINICAL DATA:  86 year old female with vomiting. Positive COVID-19. EXAM: PORTABLE CHEST 1 VIEW COMPARISON:  Chest radiograph dated 09/16/2019. FINDINGS: Minimal bibasilar hazy  density, likely atelectasis. No consolidative changes. There is no pleural effusion or pneumothorax. Stable cardiac silhouette. No acute osseous pathology. IMPRESSION: No active cardiopulmonary disease. Electronically Signed   By: Anner Crete M.D.   On: 09/19/2019 02:38   DG Chest Port 1 View  Result Date: 09/16/2019 CLINICAL DATA:  Recent syncopal episode EXAM: PORTABLE CHEST 1 VIEW COMPARISON:  None. FINDINGS: Cardiac shadows within normal limits. The lungs are well aerated bilaterally. No focal infiltrate or sizable effusion is seen. No acute bony abnormality is noted. IMPRESSION: No active disease. Electronically Signed   By: Inez Catalina M.D.   On: 09/16/2019 12:24   MM 3D SCREEN BREAST BILATERAL  Result Date: 09/01/2019 CLINICAL DATA:  Screening. EXAM: DIGITAL SCREENING BILATERAL MAMMOGRAM WITH TOMO AND CAD COMPARISON:  Previous exam(s). ACR Breast Density Category b: There are scattered areas of fibroglandular density. FINDINGS: There are no findings suspicious for malignancy. Images were processed with CAD. IMPRESSION: No mammographic evidence of malignancy. A result letter of this screening mammogram will be mailed directly to the patient. RECOMMENDATION: Screening mammogram in one year. (Code:SM-B-01Y) BI-RADS CATEGORY  1: Negative. Electronically Signed    By: Lillia Mountain M.D.   On: 09/01/2019 08:54   ECHOCARDIOGRAM COMPLETE  Result Date: 09/19/2019   ECHOCARDIOGRAM REPORT   Patient Name:   Jordan MCCOY Buford Date of Exam: 09/19/2019 Medical Rec #:  [id number]              Height:       65.0 in Accession #:    [id number]             Weight:       141.0 lb Date of Birth:  05/06/1930               BSA:          1.71 m Patient Age:    79 years               BP:           132/68 mmHg Patient Gender: F                      HR:           74 bpm. Exam Location:  [facility] Procedure: 2D Echo, Color Doppler and Cardiac Doppler Indications:     Elevated troponin  History:         Patient has no prior history of Echocardiogram examinations.                  Risk Factors:Hypertension and Diabetes.  Sonographer:     Sherrie Sport RDCS (AE) Referring Phys:  [id number] Arvella Merles MANSY Diagnosing Phys: Yolonda Kida MD IMPRESSIONS  1. Left ventricular ejection fraction, by visual estimation, is 60 to 65%. The left ventricle has normal function. Left ventricular septal wall thickness was normal. Normal left ventricular posterior wall thickness. There is no left ventricular hypertrophy.  2. Left ventricular diastolic parameters are consistent with Grade II diastolic dysfunction (pseudonormalization).  3. The left ventricle has no regional wall motion abnormalities.  4. Global right ventricle has normal systolic function.The right ventricular size is normal. No increase in right ventricular wall thickness.  5. Left atrial size was normal.  6. Right atrial size was normal.  7. The mitral valve is normal in structure. Trivial mitral valve regurgitation.  8. The tricuspid valve is normal in structure.  9. The aortic valve is normal in structure.  Aortic valve regurgitation is not visualized. 10. The pulmonic valve was grossly normal. Pulmonic valve regurgitation is not visualized. FINDINGS  Left Ventricle: Left ventricular ejection fraction, by visual estimation, is 60 to 65%. The left ventricle  has normal function. The left ventricle has no regional wall motion abnormalities. Normal left ventricular posterior wall thickness. There is no left ventricular hypertrophy. Left ventricular diastolic parameters are consistent with Grade II diastolic dysfunction (pseudonormalization). Right Ventricle: The right ventricular size is normal. No increase in right ventricular wall thickness. Global RV systolic function is has normal systolic function. Left Atrium: Left atrial size was normal in size. Right Atrium: Right atrial size was normal in size Pericardium: There is no evidence of pericardial effusion. Mitral Valve: The mitral valve is normal in structure. Trivial mitral valve regurgitation. Tricuspid Valve: The tricuspid valve is normal in structure. Tricuspid valve regurgitation mild-moderate. Aortic Valve: The aortic valve is normal in structure. Aortic valve regurgitation is not visualized. Aortic valve mean gradient measures 3.0 mmHg. Aortic valve peak gradient measures 5.1 mmHg. Aortic valve area, by VTI measures 1.91 cm. Pulmonic Valve: The pulmonic valve was grossly normal. Pulmonic valve regurgitation is not visualized. Pulmonic regurgitation is not visualized. Aorta: The aortic root is normal in size and structure. IAS/Shunts: No atrial level shunt detected by color flow Doppler.  LEFT VENTRICLE PLAX 2D LVIDd:         3.44 cm  Diastology LVIDs:         2.18 cm  LV e' lateral:   4.68 cm/s LV PW:         1.31 cm  LV E/e' lateral: 13.8 LV IVS:        1.57 cm  LV e' medial:    4.46 cm/s LVOT diam:     2.10 cm  LV E/e' medial:  14.5 LV SV:         33 ml LV SV Index:   19.17 LVOT Area:     3.46 cm  LEFT ATRIUM           Index      RIGHT ATRIUM           Index LA diam:      2.20 cm 1.29 cm/m RA Area:     13.00 cm LA Vol (A4C): 12.7 ml 7.45 ml/m RA Volume:   31.80 ml  18.65 ml/m  AORTIC VALVE                   PULMONIC VALVE AV Area (Vmax):    1.70 cm    RVOT Peak grad: 2 mmHg AV Area (Vmean):   1.61 cm  AV Area (VTI):     1.91 cm AV Vmax:           112.67 cm/s AV Vmean:          82.167 cm/s AV VTI:            0.254 m AV Peak Grad:      5.1 mmHg AV Mean Grad:      3.0 mmHg LVOT Vmax:         55.40 cm/s LVOT Vmean:        38.200 cm/s LVOT VTI:          0.140 m LVOT/AV VTI ratio: 0.55  AORTA Ao Root diam: 2.95 cm MITRAL VALVE MV Area (PHT): 2.69 cm             SHUNTS MV PHT:  81.78 msec           Systemic VTI:  0.14 m MV Decel Time: 282 msec             Systemic Diam: 2.10 cm MV E velocity: 64.50 cm/s 103 cm/s MV A velocity: 62.10 cm/s 70.3 cm/s MV E/A ratio:  1.04       1.5  Jadis Pitter D Elisheva Fallas MD Electronically signed by Yolonda Kida MD Signature Date/Time: 09/19/2019/12:36:17 PM    Final     EKG: Sinus tachycardia  ASSESSMENT AND PLAN:  Elevated troponin Acute hypoxic respiratory failure secondary to Covid Chronic renal insufficiency Diabetes type 2 GERD Hyperlipidemia Hypertension . Plan Agree with admit to telemetry Follow-up EKGs and troponins Probably represent demand ischemia doubt Covid myocarditis at this point Continue aggressive respiratory therapy including supplemental oxygen and steroids Agree with broad-spectrum antibiotic therapy Continue statin therapy for lipid management Pepcid for reflux symptoms and to help with Covid therapy Continue diabetes management and control Agree with echocardiogram for assessment of left ventricular function and valvular structures Recommend conservative cardiac management and input  Signed: Yolonda Kida MD, PHD, [facility] 09/19/2019, 1:53 PM

## 2019-09-19 NOTE — H&P (Signed)
[name] NAME: Jordan Thornton    MR#:  [id number]  DATE OF BIRTH:  1929-09-23  DATE OF ADMISSION:  09/19/2019  PRIMARY CARE PHYSICIAN: Juline Patch, MD   REQUESTING/REFERRING PHYSICIAN: Lurline Hare, MD CHIEF COMPLAINT:  No chief complaint on file.   HISTORY OF PRESENT ILLNESS:  Jordan Thornton  is a 86 y.o. African-American female with a known history of type diabetes mellitus, GERD, dyslipidemia and hypertension, who presented to the emergency room with acute onset of worsening dyspnea with associated cough productive of grayish sputum and vomiting as well as fever and chills.  She denied any bilious vomitus or hematemesis or diarrhea or abdominal pain.  She denied any wheezing.  No loss of taste or smell.  No rhinorrhea.  No dysuria, oliguria or hematuria or flank pain.  Upon presentation to the emergency room, blood pressure was 161/66 respirate was 30 with a temperature of 102.7 and later 1 one-point 9:03 75 mg p.o. Tylenol.  Pulse oximetry was initially within normal and later dropped to 87% on room air.  She was placed on 2 L O2 by nasal cannula and pulse oximetry was up to 99%.  Labs revealed mild hyperglycemia of 213 and a BUN of 24 with creatinine 1.65 close to previous levels with stage IIIb chronic kidney disease.  High-sensitivity troponin I was 258 and later 247 with LDH of 218 and ferritin of 299.  Lactic acid was 1.5 and procalcitonin 2.11.  CBC showed mild anemia and fibrinogen was 548.  COVID-19 antigen came back positive.  Influenza AMB antigens came back negative.  Blood cultures were drawn.  Chest x-ray showed minimal bibasal hazy density likely atelectasis with no consolidative changes and no acute cardiopulmonary disease.  The patient was given IV Decadron and remdesivir in addition to 4 mg of IV Zofran, 1 L of IV normal saline and Tylenol as mentioned above.  She will be admitted to medical monitored bed for further evaluation and  management.  PAST MEDICAL HISTORY:   Past Medical History:  Diagnosis Date  . Diabetes mellitus without complication ([place])   . GERD (gastroesophageal reflux disease)   . Hyperlipidemia   . Hypertension   Stage IIIb chronic kidney disease.  PAST SURGICAL HISTORY:   Past Surgical History:  Procedure Laterality Date  . bladder tack    . BREAST BIOPSY Left 10-15 yrs ago   benign  . CATARACT EXTRACTION Bilateral 05/2015  . CHOLECYSTECTOMY OPEN    . VAGINAL HYSTERECTOMY      SOCIAL HISTORY:   Social History   Tobacco Use  . Smoking status: Former Smoker    Packs/day: 0.25    Years: 25.00    Pack years: 6.25    Types: Cigarettes    Quit date: 1982    Years since quitting: 39.0  . Smokeless tobacco: Never Used  . Tobacco comment: smoking cessation materials not required  Substance Use Topics  . Alcohol use: No    Alcohol/week: 0.0 standard drinks    FAMILY HISTORY:   Family History  Problem Relation Age of Onset  . Heart disease Maternal Aunt   . Heart disease Maternal Grandmother   . Stroke Father   . Breast cancer Neg Hx     DRUG ALLERGIES:  No Known Allergies  REVIEW OF SYSTEMS:   ROS As per history of present illness. All pertinent systems were reviewed above. Constitutional,  HEENT, cardiovascular, respiratory, GI, GU, musculoskeletal, neuro, psychiatric, endocrine,  integumentary and hematologic systems were reviewed and are otherwise  negative/unremarkable except for positive findings mentioned above in the HPI.   MEDICATIONS AT HOME:   Prior to Admission medications   Medication Sig Start Date End Date Taking? Authorizing Provider  Acetaminophen (TYLENOL ARTHRITIS PAIN PO) Take 1 tablet by mouth daily.   Yes [provider]  ADVOCATE LANCETS MISC use once daily as directed to test blood sugar 04/24/15  Yes Juline Patch, MD  Blood Glucose Monitoring Suppl (FREESTYLE LITE) DEVI use once daily as directed to test blood sugar 04/24/15  Yes  Juline Patch, MD  Cholecalciferol (VITAMIN D) 2000 units CAPS Take 1 capsule by mouth daily. otc   Yes [provider]  Elderberry 575 MG/5ML SYRP Take 1 Dose by mouth daily.    Yes [provider]  FREESTYLE LITE test strip USE  STRIP TO CHECK GLUCOSE ONCE DAILY AS DIRECTED 03/03/19  Yes Juline Patch, MD  ivermectin (STROMECTOL) 3 MG TABS tablet TAKE 4.5 TABLETS BY MOUTH ONCE FOR 1 DOSE. TAKE 4.5 TABLETS TODAY AND 3 DAYS FROM NOW 09/16/19  Yes [provider]  Lancet Devices (ADVOCATE LANCING DEVICE) MISC use once daily as directed to test blood sugar 04/24/15  Yes Juline Patch, MD  lansoprazole (PREVACID) 15 MG capsule Take 1 capsule (15 mg total) by mouth 1 day or 1 dose. 09/11/19  Yes Juline Patch, MD  lisinopril-hydrochlorothiazide (ZESTORETIC) 20-12.5 MG tablet Take 1 tablet by mouth daily. Dr Holley Raring 09/11/19  Yes Juline Patch, MD  LORazepam (ATIVAN) 0.5 MG tablet Take 1 tablet (0.5 mg total) by mouth as needed. 09/11/19  Yes Juline Patch, MD  metFORMIN (GLUCOPHAGE) 500 MG tablet Take 1 tablet (500 mg total) by mouth 2 (two) times daily. 09/11/19  Yes Juline Patch, MD  Omega-3 Fatty Acids (FISH OIL) 1000 MG CAPS Take 1 capsule by mouth daily.    Yes [provider]  simvastatin (ZOCOR) 20 MG tablet Take 1 tablet (20 mg total) by mouth daily at 6 PM. 09/11/19  Yes Juline Patch, MD  sitaGLIPtin (JANUVIA) 100 MG tablet Take 1 tablet (100 mg total) by mouth daily. 09/11/19  Yes Juline Patch, MD      VITAL SIGNS:  Blood pressure 132/68, pulse 74, temperature (!) 101.3 F (38.5 C), temperature source Oral, resp. rate 20, height 5\' 5"  (1.651 m), weight 64 kg, SpO2 99 %.  PHYSICAL EXAMINATION:  Physical Exam  GENERAL:  87 y.o.-year-old African-American female patient lying in the bed with mild respiratory distress with conversational dyspnea EYES: Pupils equal, round, reactive to light and accommodation. No scleral icterus. Extraocular  muscles intact.  HEENT: Head atraumatic, normocephalic. Oropharynx and nasopharynx clear.  NECK:  Supple, no jugular venous distention. No thyroid enlargement, no tenderness.  LUNGS: Diminished bibasilar breath sounds with bibasal crackles. CARDIOVASCULAR: Regular rate and rhythm, S1, S2 normal. No murmurs, rubs, or gallops.  ABDOMEN: Soft, nondistended, nontender. Bowel sounds present. No organomegaly or mass.  EXTREMITIES: No pedal edema, cyanosis, or clubbing.  NEUROLOGIC: Cranial nerves II through XII are intact. Muscle strength 5/5 in all extremities. Sensation intact. Gait not checked.  PSYCHIATRIC: The patient is alert and oriented x 3.  Normal affect and good eye contact. SKIN: No obvious rash, lesion, or ulcer.   LABORATORY PANEL:   CBC Recent Labs  Lab 09/18/19 1916  WBC 5.6  HGB 11.7*  HCT 35.6*  PLT 154   ------------------------------------------------------------------------------------------------------------------  Chemistries  Recent  Labs  Lab 09/18/19 1916  NA 139  K 3.8  CL 102  CO2 24  GLUCOSE 213*  BUN 24*  CREATININE 1.65*  CALCIUM 8.5*  AST 37  ALT 20  ALKPHOS 41  BILITOT 1.0   ------------------------------------------------------------------------------------------------------------------  Cardiac Enzymes No results for input(s): TROPONINI in the last 168 hours. ------------------------------------------------------------------------------------------------------------------  RADIOLOGY:  DG Chest Port 1 View  Result Date: 09/19/2019 CLINICAL DATA:  86 year old female with vomiting. Positive COVID-19. EXAM: PORTABLE CHEST 1 VIEW COMPARISON:  Chest radiograph dated 09/16/2019. FINDINGS: Minimal bibasilar hazy density, likely atelectasis. No consolidative changes. There is no pleural effusion or pneumothorax. Stable cardiac silhouette. No acute osseous pathology. IMPRESSION: No active cardiopulmonary disease. Electronically Signed   By: Anner Crete M.D.   On: 09/19/2019 02:38      IMPRESSION AND PLAN:   1.  Acute hypoxemic respiratory failure secondary to COVID-19. -The patient will be admitted to a medically monitored isolation bed. -O2 protocol will be followed to keep O2 saturation above 93.   2.  Multifocal pneumonia secondary to COVID-19. -The patient will be admitted to an isolation monitored bed with droplet and contact precautions. -Given multifocal pneumonia we will empirically place the patient on IV Rocephin and Zithromax for possible bacterial superinfection especially given elevated Procalcitonin. -The patient will be placed on scheduled Mucinex and as needed Tussionex. -We will avoid nebulization as much as we can, give bronchodilator MDI if needed, and with deterioration of oxygenation try to avoid BiPAP/CPAP if possible.    -Will obtain sputum Gram stain culture and sensitivity and follow blood cultures. -O2 protocol will be followed. -We will follow CRP, ferritin, LDH and D-dimer. -Will follow manual differential for ANC/ALC ratio as well as follow troponin I and daily CBC with manual differential and CMP. - Will place the patient on IV Remdisivir and IV steroid therapy with Decadron given hypoxia.   - I discussed convalescent plasma benefits and risks with the patient who was agreeable to proceed with it. -The patient will be placed on vitamin D, vitamin C, zinc sulfate, p.o. Pepcid and aspirin. -I stopped the patient ivermectin in case it is contributing to her vomiting and as no clinical benefit was obtained so far and her Covid 19 infection.  3.  Elevated troponin I.  The patient will be placed for now on IV heparin pending improvement of renal functions to obtain a chest CTa otherwise a VQ scan could be obtained.  We will obtain a 2D echo as well as cardiology consultation for further assessment.  I notified Dr. Clayborn Bigness about the patient.  We will follow serial EKG and cardiac enzymes.  4.  Type 2  diabetes mellitus with stage IV chronic kidney disease.  We will hold Metformin especially given elevated renal functions.  We will hold Januvia and place the patient on supplement coverage with NovoLog.  5.  GERD.  PPI therapy will be resumed.  The patient will also be placed on p.o. Pepcid as mentioned above.  6.  Dyslipidemia.  The patient will be continued on statin therapy.  7.  Hypertension.  We will hold Zestoretic and place the patient on as needed IV labetalol.  8.  DVT prophylaxis.  The patient will be placed on IV heparin for now.   All the records are reviewed and case discussed with ED provider. The plan of care was discussed in details with the patient (and family). I answered all questions. The patient agreed to proceed with the above mentioned plan.  Further management will depend upon hospital course.   CODE STATUS: Full code  TOTAL TIME TAKING CARE OF THIS PATIENT: 55 minutes.    Christel Mormon M.D on 09/19/2019 at 7:08 AM  [facility]   From 7 PM-7 AM, contact night-coverage [email or web address]  CC: Primary care physician; Juline Patch, MD   Note: This dictation was prepared with Dragon dictation along with smaller phrase technology. Any transcriptional errors that result from this process are unintentional.

## 2019-09-19 NOTE — ED Provider Notes (Signed)
[facility] Emergency Department Provider Note   ____________________________________________   First MD Initiated Contact with Patient 09/19/19 0124     (approximate)  I have reviewed the triage vital signs and the nursing notes.   HISTORY  Chief Complaint Nausea/vomiting   HPI [place] Harstad is a 86 y.o. female who presents to the ED from home with a chief complaint of nausea, vomiting and generalized weakness.  Patient tested Covid positive on 1/2.  Endorses fever.  Denies cough, chest pain, shortness of breath, abdominal pain, dysuria, diarrhea.  Last vomited midday yesterday.  Has not taken antipyretics prior to arrival.  Husband also with COVID-19.       Past Medical History:  Diagnosis Date  . Diabetes mellitus without complication ([place])   . GERD (gastroesophageal reflux disease)   . Hyperlipidemia   . Hypertension     Patient Active Problem List   Diagnosis Date Noted  . Acute hypoxemic respiratory failure due to COVID-19 ([place]) 09/19/2019  . Diabetes mellitus with no complication ([place]) 0000000  . Familial multiple lipoprotein-type hyperlipidemia 01/03/2015  . Creatinine elevation 01/03/2015  . Diabetes ([place]) 12/15/2014  . Hyperlipidemia 12/15/2014  . Hypertension 12/15/2014  . Gastro-esophageal reflux disease without esophagitis 12/15/2014    Past Surgical History:  Procedure Laterality Date  . bladder tack    . BREAST BIOPSY Left 10-15 yrs ago   benign  . CATARACT EXTRACTION Bilateral 05/2015  . CHOLECYSTECTOMY OPEN    . VAGINAL HYSTERECTOMY      Prior to Admission medications   Medication Sig Start Date End Date Taking? Authorizing Provider  Acetaminophen (TYLENOL ARTHRITIS PAIN PO) Take 1 tablet by mouth daily.   Yes [provider]  ADVOCATE LANCETS MISC use once daily as directed to test blood sugar 04/24/15  Yes Juline Patch, MD  Blood Glucose Monitoring Suppl (FREESTYLE LITE) DEVI use once daily as  directed to test blood sugar 04/24/15  Yes Juline Patch, MD  Cholecalciferol (VITAMIN D) 2000 units CAPS Take 1 capsule by mouth daily. otc   Yes [provider]  Elderberry 575 MG/5ML SYRP Take 1 Dose by mouth daily.    Yes [provider]  FREESTYLE LITE test strip USE  STRIP TO CHECK GLUCOSE ONCE DAILY AS DIRECTED 03/03/19  Yes Juline Patch, MD  ivermectin (STROMECTOL) 3 MG TABS tablet TAKE 4.5 TABLETS BY MOUTH ONCE FOR 1 DOSE. TAKE 4.5 TABLETS TODAY AND 3 DAYS FROM NOW 09/16/19  Yes [provider]  Lancet Devices (ADVOCATE LANCING DEVICE) MISC use once daily as directed to test blood sugar 04/24/15  Yes Juline Patch, MD  lansoprazole (PREVACID) 15 MG capsule Take 1 capsule (15 mg total) by mouth 1 day or 1 dose. 09/11/19  Yes Juline Patch, MD  lisinopril-hydrochlorothiazide (ZESTORETIC) 20-12.5 MG tablet Take 1 tablet by mouth daily. Dr Holley Raring 09/11/19  Yes Juline Patch, MD  LORazepam (ATIVAN) 0.5 MG tablet Take 1 tablet (0.5 mg total) by mouth as needed. 09/11/19  Yes Juline Patch, MD  metFORMIN (GLUCOPHAGE) 500 MG tablet Take 1 tablet (500 mg total) by mouth 2 (two) times daily. 09/11/19  Yes Juline Patch, MD  Omega-3 Fatty Acids (FISH OIL) 1000 MG CAPS Take 1 capsule by mouth daily.    Yes [provider]  simvastatin (ZOCOR) 20 MG tablet Take 1 tablet (20 mg total) by mouth daily at 6 PM. 09/11/19  Yes Juline Patch, MD  sitaGLIPtin (JANUVIA) 100  MG tablet Take 1 tablet (100 mg total) by mouth daily. 09/11/19  Yes Juline Patch, MD    Allergies Patient has no known allergies.  Family History  Problem Relation Age of Onset  . Heart disease Maternal Aunt   . Heart disease Maternal Grandmother   . Stroke Father   . Breast cancer Neg Hx     Social History Social History   Tobacco Use  . Smoking status: Former Smoker    Packs/day: 0.25    Years: 25.00    Pack years: 6.25    Types: Cigarettes    Quit date: 1982    Years  since quitting: 39.0  . Smokeless tobacco: Never Used  . Tobacco comment: smoking cessation materials not required  Substance Use Topics  . Alcohol use: No    Alcohol/week: 0.0 standard drinks  . Drug use: No    Review of Systems  Constitutional: Positive for fever/chills Eyes: No visual changes. ENT: No sore throat. Cardiovascular: Denies chest pain. Respiratory: Denies shortness of breath. Gastrointestinal: No abdominal pain.  Positive for nausea and vomiting.  No diarrhea.  No constipation. Genitourinary: Negative for dysuria. Musculoskeletal: Negative for back pain. Skin: Negative for rash. Neurological: Negative for headaches, focal weakness or numbness.   ____________________________________________   PHYSICAL EXAM:  VITAL SIGNS: ED Triage Vitals  Enc Vitals Group     BP 09/18/19 1913 (!) 169/88     Pulse Rate 09/18/19 1913 92     Resp 09/18/19 1913 18     Temp 09/18/19 1913 (!) 100.8 F (38.2 C)     Temp Source 09/18/19 1913 Oral     SpO2 09/18/19 1912 99 %     Weight 09/18/19 1914 141 lb (64 kg)     Height 09/18/19 1914 5\' 5"  (1.651 m)     Head Circumference --      Peak Flow --      Pain Score 09/18/19 1913 0     Pain Loc --      Pain Edu? --      Excl. in [place]? --     Constitutional: Alert and oriented.  Elderly appearing and in no acute distress. Eyes: Conjunctivae are normal. PERRL. EOMI. Head: Atraumatic. Nose: No congestion/rhinnorhea. Mouth/Throat: Mucous membranes are moist.  Oropharynx non-erythematous. Neck: No stridor.  Supple neck without meningismus. Cardiovascular: Normal rate, regular rhythm. Grossly normal heart sounds.  Good peripheral circulation. Respiratory: Normal respiratory effort.  No retractions. Lungs with scattered rhonchi. Gastrointestinal: Soft and nontender to light or deep palpation. No distention. No abdominal bruits. No CVA tenderness. Musculoskeletal: No lower extremity tenderness nor edema.  No joint  effusions. Neurologic:  Normal speech and language. No gross focal neurologic deficits are appreciated. No gait instability. Skin:  Skin is warm, dry and intact. No rash noted.  No petechiae. Psychiatric: Mood and affect are normal. Speech and behavior are normal.  ____________________________________________   LABS (all labs ordered are listed, but only abnormal results are displayed)  Labs Reviewed  COMPREHENSIVE METABOLIC PANEL - Abnormal; Notable for the following components:      Result Value   Glucose, Bld 213 (*)    BUN 24 (*)    Creatinine, Ser 1.65 (*)    Calcium 8.5 (*)    GFR calc non Af Amer 28 (*)    GFR calc Af Amer 32 (*)    All other components within normal limits  CBC - Abnormal; Notable for the following components:   Hemoglobin 11.7 (*)  HCT 35.6 (*)    MCV 77.6 (*)    MCH 25.5 (*)    All other components within normal limits  LACTATE DEHYDROGENASE - Abnormal; Notable for the following components:   LDH 218 (*)    All other components within normal limits  FIBRINOGEN - Abnormal; Notable for the following components:   Fibrinogen 548 (*)    All other components within normal limits  APTT - Abnormal; Notable for the following components:   aPTT 40 (*)    All other components within normal limits  GLUCOSE, CAPILLARY - Abnormal; Notable for the following components:   Glucose-Capillary 156 (*)    All other components within normal limits  TROPONIN I (HIGH SENSITIVITY) - Abnormal; Notable for the following components:   Troponin I (High Sensitivity) 258 (*)    All other components within normal limits  TROPONIN I (HIGH SENSITIVITY) - Abnormal; Notable for the following components:   Troponin I (High Sensitivity) 247 (*)    All other components within normal limits  CULTURE, BLOOD (ROUTINE X 2)  CULTURE, BLOOD (ROUTINE X 2)  CULTURE, BLOOD (ROUTINE X 2)  CULTURE, BLOOD (ROUTINE X 2)  EXPECTORATED SPUTUM ASSESSMENT W REFEX TO RESP CULTURE  LIPASE, BLOOD   LACTIC ACID, PLASMA  PROCALCITONIN  FERRITIN  TRIGLYCERIDES  PROTIME-INR  URINALYSIS, COMPLETE (UACMP) WITH MICROSCOPIC  C-REACTIVE PROTEIN  D-DIMER, QUANTITATIVE (NOT AT [facility])  INFLUENZA PANEL BY PCR (TYPE A & B)  HEPARIN LEVEL (UNFRACTIONATED)  ABO/RH   ____________________________________________  EKG  ED ECG REPORT I, Moe Brier J, the attending physician, personally viewed and interpreted this ECG.   Date: 09/19/2019  EKG Time: 0209  Rate: 82  Rhythm: normal EKG, normal sinus rhythm  Axis: LAD  Intervals:left anterior fascicular block  ST&T Change: Nonspecific  ____________________________________________  RADIOLOGY  ED MD interpretation: No acute cardiopulmonary process  Official radiology report(s): DG Chest Port 1 View  Result Date: 09/19/2019 CLINICAL DATA:  86 year old female with vomiting. Positive COVID-19. EXAM: PORTABLE CHEST 1 VIEW COMPARISON:  Chest radiograph dated 09/16/2019. FINDINGS: Minimal bibasilar hazy density, likely atelectasis. No consolidative changes. There is no pleural effusion or pneumothorax. Stable cardiac silhouette. No acute osseous pathology. IMPRESSION: No active cardiopulmonary disease. Electronically Signed   By: Anner Crete M.D.   On: 09/19/2019 02:38    ____________________________________________   PROCEDURES  Procedure(s) performed (including Critical Care):  Procedures  CRITICAL CARE Performed by: Paulette Blanch   Total critical care time: 45 minutes  Critical care time was exclusive of separately billable procedures and treating other patients.  Critical care was necessary to treat or prevent imminent or life-threatening deterioration.  Critical care was time spent personally by me on the following activities: development of treatment plan with patient and/or surrogate as well as nursing, discussions with consultants, evaluation of patient's response to treatment, examination of patient, obtaining history from  patient or surrogate, ordering and performing treatments and interventions, ordering and review of laboratory studies, ordering and review of radiographic studies, pulse oximetry and re-evaluation of patient's condition.  ____________________________________________   INITIAL IMPRESSION / ASSESSMENT AND PLAN / ED COURSE  As part of my medical decision making, I reviewed the following data within the [place] notes reviewed and incorporated, Labs reviewed, EKG interpreted, Old chart reviewed, Radiograph reviewed and Notes from prior ED visits     [place] McCoy Dooly was evaluated in Emergency Department on 09/19/2019 for the symptoms described in the history of present illness. She was evaluated in  the context of the global COVID-19 pandemic, which necessitated consideration that the patient might be at risk for infection with the SARS-CoV-2 virus that causes COVID-19. Institutional protocols and algorithms that pertain to the evaluation of patients at risk for COVID-19 are in a state of rapid change based on information released by regulatory bodies including the CDC and federal and state organizations. These policies and algorithms were followed during the patient's care in the ED.    86 year old female who is Covid positive presenting with nausea and vomiting. Differential includes, but is not limited to, viral syndrome, bronchitis including COPD exacerbation, pneumonia, reactive airway disease including asthma, CHF including exacerbation with or without pulmonary/interstitial edema, pneumothorax, ACS, thoracic trauma, and pulmonary embolism.  Rectal Tylenol administered for fever.  Initial troponin elevated.  Add further Covid/sepsis lab work.  Administer IV fluids and Zofran for symptomatic relief.  Obtain chest x-ray.   Clinical Course as of Sep 19 607  Tue Sep 19, 2019  [id number] Room air saturations now 86%, tachypneic to a rate of 32.  Will place on nasal cannula  oxygen.  Will discuss with hospitalist services to evaluate patient in the emergency department for admission.   [JS]    Clinical Course User Index [JS] Paulette Blanch, MD     ____________________________________________   FINAL CLINICAL IMPRESSION(S) / ED DIAGNOSES  Final diagnoses:  Fever, unspecified fever cause  COVID-19  Non-intractable vomiting with nausea, unspecified vomiting type  Hypoxia     ED Discharge Orders    None       Note:  This document was prepared using Dragon voice recognition software and may include unintentional dictation errors.   Paulette Blanch, MD 09/19/19 [phone]

## 2019-09-19 NOTE — ED Notes (Signed)
RN called to request for lab to add first troponin to original blood testing. Lab reports they will be able to do this.

## 2019-09-19 NOTE — Progress Notes (Signed)
ANTICOAGULATION CONSULT NOTE - Initial Consult  Pharmacy Consult for Heparin Indication: elevated troponin, Covid +  No Known Allergies  Patient Measurements: Height: 5\' 5"  (165.1 cm) Weight: 141 lb (64 kg) IBW/kg (Calculated) : 57 HEPARIN DW (KG): 64  Vital Signs: Temp: 101.3 F (38.5 C) (01/05 0318) Temp Source: Oral (01/05 0318) BP: 132/68 (01/05 0345) Pulse Rate: 74 (01/05 0345)  Labs: Recent Labs    09/16/19 1147 09/16/19 1319 09/18/19 1916 09/19/19 0150  HGB 11.5*  --  11.7*  --   HCT 35.1*  --  35.6*  --   PLT 158  --  154  --   CREATININE 1.61*  --  1.65*  --   TROPONINIHS 11 13 258* 247*    Estimated Creatinine Clearance: 21.6 mL/min (A) (by C-G formula based on SCr of 1.65 mg/dL (H)).   Medical History: Past Medical History:  Diagnosis Date  . Diabetes mellitus without complication ([place])   . GERD (gastroesophageal reflux disease)   . Hyperlipidemia   . Hypertension     Medications:  (Not in a hospital admission)   Assessment: Pharmacy asked to initiate and monitor Heparin for elevated troponin, covid +.  Pt not on anticoagulants per current med list.  Baseline labs ordered.  Goal of Therapy:  Heparin level 0.3-0.7 units/ml Monitor platelets by anticoagulation protocol: Yes   Plan:  Heparin 3500 units bolus now x 1 then infusion at 750 units/hr Check Heparin level in 8 hrs  [place] Crane, Tametra Ahart A 09/19/2019,5:32 AM

## 2019-09-19 NOTE — Progress Notes (Signed)
ANTICOAGULATION CONSULT NOTE  Pharmacy Consult for Heparin Indication: elevated troponin, Covid +  No Known Allergies  Patient Measurements: Height: 5\' 5"  (165.1 cm) Weight: 141 lb (64 kg) IBW/kg (Calculated) : 57 HEPARIN DW (KG): 64  Vital Signs: Temp: 97.8 F (36.6 C) (01/05 1445) Temp Source: Oral (01/05 1445) BP: 173/72 (01/05 1450) Pulse Rate: 55 (01/05 1450)  Labs: Recent Labs    09/18/19 1916 09/19/19 0150 09/19/19 0234 09/19/19 1503  HGB 11.7*  --   --   --   HCT 35.6*  --   --   --   PLT 154  --   --   --   APTT  --   --  40*  --   LABPROT  --   --  13.7  --   INR  --   --  1.1  --   HEPARINUNFRC  --   --   --  1.01*  CREATININE 1.65*  --   --   --   TROPONINIHS 258* 247*  --   --     Estimated Creatinine Clearance: 21.6 mL/min (A) (by C-G formula based on SCr of 1.65 mg/dL (H)).   Medical History: Past Medical History:  Diagnosis Date  . Diabetes mellitus without complication ([place])   . GERD (gastroesophageal reflux disease)   . Hyperlipidemia   . Hypertension      Assessment: Pharmacy asked to initiate and monitor Heparin for elevated troponin, covid +.  Pt not on anticoagulants per current med list.  Goal of Therapy:  Heparin level 0.3-0.7 units/ml Monitor platelets by anticoagulation protocol: Yes   Plan:  1/5 1503 HL 1.01 supratherapeutic. Spoke with RN, will hold heparin drip for ~ 1 hr. Heparin drip to restart at 1700 at 600 units/hr. HL 1/6 at 0100. CBC daily.  Tawnya Crook, PharmD 09/19/2019,3:45 PM

## 2019-09-19 NOTE — Progress Notes (Signed)
  PROGRESS NOTE    Jordan Thornton  [id number] DOB: [birth year] DOA: 09/19/2019  PCP: Juline Patch, MD    LOS - 6    86 year old female was admitted earlier this morning with acute hypoxic respiratory failure in the setting of COVID-19 pneumonia.  She had been having progressive dyspnea with productive cough as well as vomiting and fevers and chills at home.  When patient was seen this morning she reported feeling somewhat better already after getting IV fluids.  Her husband also presented to the ED at the same time but was able to be discharged home this morning.  We discussed the plan for treatment including possibility of transfer to [facility] if her oxygen requirements should increase, she was agreeable.  In case of worsening hypoxia moving forward, discussed Actemra with patient this morning.  She denies any active malignancy, history of tuberculosis or exposure, hepatitis or any liver disease, and is not on any immunosuppressive medications.  On exam she appeared in no distress was on 2 L/min of oxygen.   Lung sounds with bibasilar crackles.   I have reviewed the full H&P by Dr. Sidney Ace, and I agree with the assessment and plan as outlined therein.    Ezekiel Slocumb, DO [facility]   If 7PM-7AM, please contact night-coverage [email or web address] Password [email or web address] 09/19/2019, 4:40 PM

## 2019-09-19 NOTE — ED Notes (Signed)
This nurse attempted to insert second IV and was unsuccessful.

## 2019-09-19 NOTE — Plan of Care (Signed)
  Problem: Education: Goal: Knowledge of risk factors and measures for prevention of condition will improve Outcome: Progressing   Problem: Coping: Goal: Psychosocial and spiritual needs will be supported Outcome: Progressing   Problem: Respiratory: Goal: Will maintain a patent airway Outcome: Progressing Goal: Complications related to the disease process, condition or treatment will be avoided or minimized Outcome: Progressing   

## 2019-09-19 NOTE — ED Notes (Signed)
Pt placed on 2L oxygen via [place] after oxygen saturation dropped to 87% on RA.

## 2019-09-19 NOTE — Progress Notes (Signed)
*  PRELIMINARY RESULTS* Echocardiogram 2D Echocardiogram has been performed.  Jordan Thornton 09/19/2019, 9:34 AM

## 2019-09-19 NOTE — TOC Initial Note (Signed)
Transition of Care [facility]) - Initial/Assessment Note    Patient Details  Name: Jordan Thornton MRN: [id number] Date of Birth: October 08, 1929  [facility] CM/SW Contact:    Shelbie Hutching, RN Phone Number: 09/19/2019, 3:11 PM  Clinical Narrative:                 Patient admitted with COVID on acute O2 at 2L [place].   Patient is from home where she lives with her husband.  Patient reports that she is independent in all ADL's and requires no assistive devices.  Patient drives and is current with her PCP.   RNCM will cont to follow and assist with any discharge needs.   Expected Discharge Plan: Home/Self Care Barriers to Discharge: Continued Medical Work up   Patient Goals and CMS Choice Patient states their goals for this hospitalization and ongoing recovery are:: To get completely well      Expected Discharge Plan and Services Expected Discharge Plan: Home/Self Care       Living arrangements for the past 2 months: Single Family Home                                      Prior Living Arrangements/Services Living arrangements for the past 2 months: Single Family Home Lives with:: Spouse Patient language and need for interpreter reviewed:: Yes Do you feel safe going back to the place where you live?: Yes      Need for Family Participation in Patient Care: Yes (Comment)(COVID) Care giver support system in place?: Yes (comment)(Husband)   Criminal Activity/Legal Involvement Pertinent to Current Situation/Hospitalization: No - Comment as needed  Activities of Daily Living Home Assistive Devices/Equipment: None ADL Screening (condition at time of admission) Patient's cognitive ability adequate to safely complete daily activities?: Yes Is the patient deaf or have difficulty hearing?: Yes Does the patient have difficulty seeing, even when wearing glasses/contacts?: No Does the patient have difficulty concentrating, remembering, or making decisions?: No Patient  able to express need for assistance with ADLs?: Yes Does the patient have difficulty dressing or bathing?: No Independently performs ADLs?: Yes (appropriate for developmental age) Does the patient have difficulty walking or climbing stairs?: No Weakness of Legs: None Weakness of Arms/Hands: None  Permission Sought/Granted Permission sought to share information with : Case Manager, Family Supports Permission granted to share information with : Yes, Verbal Permission Granted        Permission granted to share info w Relationship: Husband     Emotional Assessment   Attitude/Demeanor/Rapport: Engaged Affect (typically observed): Accepting Orientation: : Oriented to Self, Oriented to Place, Oriented to  Time, Oriented to Situation Alcohol / Substance Use: Not Applicable Psych Involvement: No (comment)  Admission diagnosis:  Hypoxia [R09.02] Fever, unspecified fever cause [R50.9] Non-intractable vomiting with nausea, unspecified vomiting type [R11.2] Acute hypoxemic respiratory failure due to COVID-19 ([place]) [U07.1, J96.01] COVID-19 [U07.1] Patient Active Problem List   Diagnosis Date Noted  . Acute hypoxemic respiratory failure due to COVID-19 ([place]) 09/19/2019  . Diabetes mellitus with no complication ([place]) 0000000  . Familial multiple lipoprotein-type hyperlipidemia 01/03/2015  . Creatinine elevation 01/03/2015  . Diabetes ([place]) 12/15/2014  . Hyperlipidemia 12/15/2014  . Hypertension 12/15/2014  . Gastro-esophageal reflux disease without esophagitis 12/15/2014   PCP:  Juline Patch, MD Pharmacy:   [facility] [place], [place]  [place] Phone: [phone] Fax: [phone]  [place], [place] [address] [place] [address] Phone: [phone] Fax: [phone]     Social Determinants of Health (SDOH) Interventions    Readmission  Risk Interventions No flowsheet data found.

## 2019-09-19 NOTE — Progress Notes (Signed)
Remdesivir - Pharmacy Brief Note   O:  ALT: 20 CXR: evidence of infection SpO2: 94% on RA   A/P:  Remdesivir 200 mg IVPB once followed by 100 mg IVPB daily x 4 days.   Hart Robinsons, PharmD Clinical Pharmacist  09/19/2019 3:38 AM

## 2019-09-19 NOTE — ED Notes (Signed)
Pt cell phone has rang multiple times since arriving to treatment room and pt has continuously denied wanting to answer the phone or have this RN answer the phone. Phone rang again while this RN at bedside and pt agreed to allow RN to answer the phone to reassure family. Pt able to talk to family on the phone for a short period of time.

## 2019-09-20 LAB — COMPREHENSIVE METABOLIC PANEL
ALT: 20 U/L (ref 0–44)
AST: 34 U/L (ref 15–41)
Albumin: 3 g/dL — ABNORMAL LOW (ref 3.5–5.0)
Alkaline Phosphatase: 34 U/L — ABNORMAL LOW (ref 38–126)
Anion gap: 12 (ref 5–15)
BUN: 34 mg/dL — ABNORMAL HIGH (ref 8–23)
CO2: 20 mmol/L — ABNORMAL LOW (ref 22–32)
Calcium: 7.8 mg/dL — ABNORMAL LOW (ref 8.9–10.3)
Chloride: 112 mmol/L — ABNORMAL HIGH (ref 98–111)
Creatinine, Ser: 1.62 mg/dL — ABNORMAL HIGH (ref 0.44–1.00)
GFR calc Af Amer: 33 mL/min — ABNORMAL LOW (ref >60)
GFR calc non Af Amer: 28 mL/min — ABNORMAL LOW (ref >60)
Glucose, Bld: 109 mg/dL — ABNORMAL HIGH (ref 70–99)
Potassium: 4.1 mmol/L (ref 3.5–5.1)
Sodium: 144 mmol/L (ref 135–145)
Total Bilirubin: 0.6 mg/dL (ref 0.3–1.2)
Total Protein: 6.1 g/dL — ABNORMAL LOW (ref 6.5–8.1)

## 2019-09-20 LAB — CBC WITH DIFFERENTIAL/PLATELET
Abs Immature Granulocytes: 0.06 10*3/uL (ref 0.00–0.07)
Basophils Absolute: 0 10*3/uL (ref 0.0–0.1)
Basophils Relative: 0 %
Eosinophils Absolute: 0 10*3/uL (ref 0.0–0.5)
Eosinophils Relative: 0 %
HCT: 31.2 % — ABNORMAL LOW (ref 36.0–46.0)
Hemoglobin: 10.4 g/dL — ABNORMAL LOW (ref 12.0–15.0)
Immature Granulocytes: 1 %
Lymphocytes Relative: 8 %
Lymphs Abs: 0.8 10*3/uL (ref 0.7–4.0)
MCH: 26 pg (ref 26.0–34.0)
MCHC: 33.3 g/dL (ref 30.0–36.0)
MCV: 78 fL — ABNORMAL LOW (ref 80.0–100.0)
Monocytes Absolute: 0.4 10*3/uL (ref 0.1–1.0)
Monocytes Relative: 4 %
Neutro Abs: 8.6 10*3/uL — ABNORMAL HIGH (ref 1.7–7.7)
Neutrophils Relative %: 87 %
Platelets: 155 10*3/uL (ref 150–400)
RBC: 4 MIL/uL (ref 3.87–5.11)
RDW: 14.8 % (ref 11.5–15.5)
WBC: 9.9 10*3/uL (ref 4.0–10.5)
nRBC: 0 % (ref 0.0–0.2)

## 2019-09-20 LAB — FERRITIN: Ferritin: 693 ng/mL — ABNORMAL HIGH (ref 11–307)

## 2019-09-20 LAB — GLUCOSE, CAPILLARY
Glucose-Capillary: 212 mg/dL — ABNORMAL HIGH (ref 70–99)
Glucose-Capillary: 227 mg/dL — ABNORMAL HIGH (ref 70–99)
Glucose-Capillary: 87 mg/dL (ref 70–99)
Glucose-Capillary: 89 mg/dL (ref 70–99)
Glucose-Capillary: 92 mg/dL (ref 70–99)

## 2019-09-20 LAB — C-REACTIVE PROTEIN: CRP: 13.6 mg/dL — ABNORMAL HIGH (ref <1.0)

## 2019-09-20 LAB — HEPARIN LEVEL (UNFRACTIONATED): Heparin Unfractionated: 0.88 IU/mL — ABNORMAL HIGH (ref 0.30–0.70)

## 2019-09-20 LAB — FIBRIN DERIVATIVES D-DIMER (ARMC ONLY): Fibrin derivatives D-dimer (ARMC): 1591.52 ng/mL (FEU) — ABNORMAL HIGH (ref 0.00–499.00)

## 2019-09-20 MED ORDER — HYDROCHLOROTHIAZIDE 12.5 MG PO CAPS
12.5000 mg | ORAL_CAPSULE | Freq: Every day | ORAL | Status: DC
  Administered 2019-09-20 – 2019-09-21 (×2): 12.5 mg via ORAL
  Filled 2019-09-20 (×2): qty 1

## 2019-09-20 MED ORDER — HEPARIN SODIUM (PORCINE) 5000 UNIT/ML IJ SOLN
5000.0000 [IU] | Freq: Three times a day (TID) | INTRAMUSCULAR | Status: DC
  Administered 2019-09-20 – 2019-09-21 (×3): 5000 [IU] via SUBCUTANEOUS
  Filled 2019-09-20 (×3): qty 1

## 2019-09-20 MED ORDER — INSULIN ASPART 100 UNIT/ML ~~LOC~~ SOLN: 0.0000 [IU] | Freq: Three times a day (TID) | SUBCUTANEOUS | Status: DC

## 2019-09-20 MED ORDER — LISINOPRIL 20 MG PO TABS
20.0000 mg | ORAL_TABLET | Freq: Every day | ORAL | Status: DC
  Administered 2019-09-20 – 2019-09-21 (×2): 20 mg via ORAL
  Filled 2019-09-20: qty 1

## 2019-09-20 MED ORDER — LISINOPRIL-HYDROCHLOROTHIAZIDE 20-12.5 MG PO TABS: 1.0000 | ORAL_TABLET | Freq: Every day | ORAL | Status: DC

## 2019-09-20 NOTE — Progress Notes (Addendum)
ANTICOAGULATION CONSULT NOTE  Pharmacy Consult for Heparin Indication: elevated troponin, Covid +  No Known Allergies  Patient Measurements: Height: 5\' 5"  (165.1 cm) Weight: 141 lb (64 kg) IBW/kg (Calculated) : 57 HEPARIN DW (KG): 64  Vital Signs: Temp: 98.6 F (37 C) (01/06 0749) Temp Source: Oral (01/06 0426) BP: 160/70 (01/06 0749) Pulse Rate: 66 (01/06 0749)  Labs: Recent Labs    09/18/19 1916 09/19/19 0150 09/19/19 0234 09/19/19 1503 09/19/19 1641 09/20/19 0819  HGB 11.7*  --   --   --   --  10.4*  HCT 35.6*  --   --   --   --  31.2*  PLT 154  --   --   --   --  155  APTT  --   --  40*  --   --   --   LABPROT  --   --  13.7  --   --   --   INR  --   --  1.1  --   --   --   HEPARINUNFRC  --   --   --  1.01* 0.79* 0.88*  CREATININE 1.65*  --   --   --   --   --   TROPONINIHS 258* 247*  --   --   --   --     Estimated Creatinine Clearance: 21.6 mL/min (A) (by C-G formula based on SCr of 1.65 mg/dL (H)).   Medical History: Past Medical History:  Diagnosis Date  . Diabetes mellitus without complication ([place])   . GERD (gastroesophageal reflux disease)   . Hyperlipidemia   . Hypertension      Assessment: Pharmacy asked to initiate and monitor Heparin for elevated troponin, covid +.  Pt not on anticoagulants per current med list.  1/5 1503 HL 1.01 supratherapeutic. Spoke with RN, will hold heparin drip for ~ 1 hr. Infusion decreased to 600 units/hr.   Goal of Therapy:  Heparin level 0.3-0.7 units/ml Monitor platelets by anticoagulation protocol: Yes   Plan:  1/6 @ 0819 HL: 0.88. Level is supratherapeutic. Will decrease heparin infusion to 450 units/hr.  Will recheck HL 6 hours after infusion rate change.  Continue to check CBCs daily while on heparin infusion.   Pernell Dupre, PharmD, BCPS Clinical Pharmacist 09/20/2019 9:35 AM

## 2019-09-20 NOTE — Evaluation (Signed)
Physical Therapy Evaluation Patient Details Name: Jordan Thornton MRN: [id number] DOB: 06/12/1930 Today's Date: 09/20/2019   History of Present Illness  presented to ER secondary to progressive cough, SOB; admitted for management of acute hypoxic respiratory failure in the setting of COVID-19 pneumonia. Noted with mild elevation in troponin, likely demand ischemia per notes; no further testing/intervention planned per physician.  Clinical Impression  Upon evaluation, patient alert and oriented; follows commands and demonstrates good effort with mobility tasks. Bilat UE/LE strength and ROM grossly symmetrical and WFL; no focal weakness appreciated.  Able to complete bed mobility with mod indep; sit/stand, basic transfers and gait (400') without assist device, sup/mod indep. Demonstrates reciprocal stepping pattern with fair step height/length; mild guarding, but no overt buckling or LOB.  No SOB noted; sats >94-95% on RA throughout.  Good awareness of limits of stability and overall safety needs; voices understanding of education regarding activity pacing and gradual return to activity. No additional acute PT needs identified at this time; will complete order.  Please re-consult should goals of care change.     Follow Up Recommendations No PT follow up    Equipment Recommendations       Recommendations for Other Services       Precautions / Restrictions Precautions Precautions: None Restrictions Weight Bearing Restrictions: No      Mobility  Bed Mobility Overal bed mobility: Independent                Transfers Overall transfer level: Modified independent Equipment used: None                Ambulation/Gait Ambulation/Gait assistance: Modified independent (Device/Increase time) Gait Distance (Feet): 400 Feet Assistive device: None   Gait velocity: 10' walk time, 8-9 seconds   General Gait Details: reciprocal stepping pattern with fair step height/length;  mild guarding, but no overt buckling or LOB.  No SOB noted; sats >94-95% on RA throughout.  Stairs            Wheelchair Mobility    Modified Rankin (Stroke Patients Only)       Balance Overall balance assessment: Needs assistance Sitting-balance support: No upper extremity supported;Feet supported Sitting balance-Leahy Scale: Good     Standing balance support: No upper extremity supported Standing balance-Leahy Scale: Good                               Pertinent Vitals/Pain Pain Assessment: No/denies pain    Home Living Family/patient expects to be discharged to:: Private residence Living Arrangements: Spouse/significant other Available Help at Discharge: Family Type of Home: House       Home Layout: One level        Prior Function Level of Independence: Independent         Comments: Indep with ADLs, household and community mobilization without assist device; + driving; no home O2.     Hand Dominance        Extremity/Trunk Assessment   Upper Extremity Assessment Upper Extremity Assessment: Overall WFL for tasks assessed    Lower Extremity Assessment Lower Extremity Assessment: Overall WFL for tasks assessed       Communication   Communication: No difficulties  Cognition Arousal/Alertness: Awake/alert Behavior During Therapy: WFL for tasks assessed/performed Overall Cognitive Status: Within Functional Limits for tasks assessed  General Comments      Exercises Other Exercises Other Exercises: Toilet transfer, ambulatory without assist device, mod indep; sit/stand, standing balance at sink, mod indep. Good awareness of limits of stability and overall safety needs. Other Exercises: Educated on role of activity pacing, progressive return to activity; patient voiced understanding of all information.   Assessment/Plan    PT Assessment Patent does not need any further PT  services  PT Problem List         PT Treatment Interventions      PT Goals (Current goals can be found in the Care Plan section)  Acute Rehab PT Goals Patient Stated Goal: to return home PT Goal Formulation: All assessment and education complete, DC therapy Time For Goal Achievement: 09/20/19 Potential to Achieve Goals: Good    Frequency     Barriers to discharge        Co-evaluation               AM-PAC PT "6 Clicks" Mobility  Outcome Measure Help needed turning from your back to your side while in a flat bed without using bedrails?: None Help needed moving from lying on your back to sitting on the side of a flat bed without using bedrails?: None Help needed moving to and from a bed to a chair (including a wheelchair)?: None Help needed standing up from a chair using your arms (e.g., wheelchair or bedside chair)?: None Help needed to walk in hospital room?: None Help needed climbing 3-5 steps with a railing? : A Little 6 Click Score: 23    End of Session Equipment Utilized During Treatment: Oxygen Activity Tolerance: Patient tolerated treatment well Patient left: in chair;with call bell/phone within reach Nurse Communication: Mobility status PT Visit Diagnosis: Difficulty in walking, not elsewhere classified (R26.2);Muscle weakness (generalized) (M62.81)    Time: [id number] PT Time Calculation (min) (ACUTE ONLY): 24 min   Charges:   PT Evaluation $PT Eval Moderate Complexity: 1 Mod PT Treatments $Therapeutic Activity: 8-22 mins        [name], PT, DPT, NCS 09/20/19, 2:56 PM [phone]

## 2019-09-20 NOTE — Progress Notes (Signed)
PROGRESS NOTE    Jordan Thornton  [id number] DOB: 08/29/1930 DOA: 09/19/2019 PCP: Juline Patch, MD    Assessment & Plan:   Active Problems:   Acute hypoxemic respiratory failure due to COVID-19 [facility])    Jordan Thornton  is a 86 y.o. AA female with a known history of type diabetes mellitus, GERD, dyslipidemia and hypertension, who presented to the emergency room with acute onset of worsening dyspnea with associated cough productive of grayish sputum and vomiting as well as fever and chills.     Acute hypoxemic respiratory failure secondary to COVID-19 PNA --fever but no leukocytosis on presentation.  Pt needed 2L O2 on presentation.  Today, already on RA. PLAN: - continue IV Remdisivir and IV steroid therapy with Decadron given hypoxia.   -scheduled Mucinex and as needed Tussionex. - bronchodilator PRN -vitamin D, vitamin C, zinc sulfate, p.o. Pepcid and aspirin. -Hold home ivermectin in case it is contributing to her vomiting   Elevated procal -CXR showed "Minimal bibasilar hazy density, likely atelectasis."  However, given fever, elevated procal 2.11, pt was started empirically on IV Rocephin and Zithromax for possible bacterial superinfection. --continue ceftriaxone and azithromycin  3.  Elevated troponin I likely 2/2 demand ischemia --trop 250 flat.  Started on IV heparin gtt. --Cards consulted and deemed "Probably represent demand ischemia" at this point.  TTE wnl.   PLAN: --d/c heparin gtt  4.  Type 2 diabetes mellitus with stage IV chronic kidney disease.   --hold Metformin and Januvia  --SSI TID with meals  5.  GERD.   --PPI therapy will be resumed.   --p.o. Pepcid   6.  Dyslipidemia.   --continue on statin therapy.  7.  Hypertension.   --resume home Zestoretic    DVT prophylaxis: Heparin SQ Code Status: Full code  Family Communication: updated husband on the phone Disposition Plan: Home tomorrow   Subjective and Interval History:  Pt  reported feeling much better, now off O2.  No fever, dyspnea, chest pain, abdominal pain, N/V/D, dysuria, increased swelling.   Objective: Vitals:   09/19/19 1943 09/20/19 0426 09/20/19 0749 09/20/19 1448  BP: (!) 166/82 (!) 157/91 (!) 160/70   Pulse: (!) 59 75 66   Resp: 18 20 17    Temp: (!) 97 F (36.1 C) 98.6 F (37 C) 98.6 F (37 C)   TempSrc: Axillary Oral    SpO2: 99% 95% 96% 95%  Weight:      Height:        Intake/Output Summary (Last 24 hours) at 09/20/2019 1518 Last data filed at 09/20/2019 1400 Gross per 24 hour  Intake 2408.13 ml  Output --  Net 2408.13 ml   Filed Weights   09/18/19 1914  Weight: 64 kg    Examination:   Constitutional: NAD, AAOx3 HEENT: conjunctivae and lids normal, EOMI CV: RRR no M,R,G. Distal pulses +2.  No cyanosis.   RESP: Crackles over posterior right mid-low lung fields, normal respiratory effort, on room air GI: +BS, NTND Extremities: No effusions, edema, or tenderness in BLE SKIN: warm, dry and intact Neuro: II - XII grossly intact.  Sensation intact Psych: Normal mood and affect.  Appropriate judgement and reason   Data Reviewed: I have personally reviewed following labs and imaging studies  CBC: Recent Labs  Lab 09/16/19 1147 09/18/19 1916 09/20/19 0819  WBC 6.6 5.6 9.9  NEUTROABS  --   --  8.6*  HGB 11.5* 11.7* 10.4*  HCT 35.1* 35.6* 31.2*  MCV 78.2* 77.6* 78.0*  PLT 158 154 [id number]   Basic Metabolic Panel: Recent Labs  Lab 09/16/19 1147 09/18/19 1916 09/20/19 0819  NA 143 139 144  K 3.9 3.8 4.1  CL 106 102 112*  CO2 24 24 20*  GLUCOSE 134* 213* 109*  BUN 21 24* 34*  CREATININE 1.61* 1.65* 1.62*  CALCIUM 9.0 8.5* 7.8*   GFR: Estimated Creatinine Clearance: 22 mL/min (A) (by C-G formula based on SCr of 1.62 mg/dL (H)). Liver Function Tests: Recent Labs  Lab 09/18/19 1916 09/20/19 0819  AST 37 34  ALT 20 20  ALKPHOS 41 34*  BILITOT 1.0 0.6  PROT 7.6 6.1*  ALBUMIN 4.0 3.0*   Recent Labs  Lab  09/18/19 1916  LIPASE 36   No results for input(s): AMMONIA in the last 168 hours. Coagulation Profile: Recent Labs  Lab 09/19/19 0234  INR 1.1   Cardiac Enzymes: No results for input(s): CKTOTAL, CKMB, CKMBINDEX, TROPONINI in the last 168 hours. BNP (last 3 results) No results for input(s): PROBNP in the last 8760 hours. HbA1C: No results for input(s): HGBA1C in the last 72 hours. CBG: Recent Labs  Lab 09/19/19 1649 09/19/19 1959 09/20/19 0006 09/20/19 0418 09/20/19 0747  GLUCAP 182* 116* 87 92 89   Lipid Profile: Recent Labs    09/19/19 0234  TRIG 94   Thyroid Function Tests: No results for input(s): TSH, T4TOTAL, FREET4, T3FREE, THYROIDAB in the last 72 hours. Anemia Panel: Recent Labs    09/19/19 0234 09/20/19 0819  FERRITIN 299 693*   Sepsis Labs: Recent Labs  Lab 09/19/19 0234  PROCALCITON 2.11  LATICACIDVEN 1.5    Recent Results (from the past 240 hour(s))  Respiratory Panel by RT PCR (Flu A&B, Covid) - Nasopharyngeal Swab     Status: Abnormal   Collection Time: 09/16/19 12:30 PM   Specimen: Nasopharyngeal Swab  Result Value Ref Range Status   SARS Coronavirus 2 by RT PCR POSITIVE (A) NEGATIVE Final    Comment: RESULT CALLED TO, READ BACK BY AND VERIFIED WITH: ASHLEY MURRAY ON 09/16/19 AT 1403 QSD (NOTE) SARS-CoV-2 target nucleic acids are DETECTED. SARS-CoV-2 RNA is generally detectable in upper respiratory specimens  during the acute phase of infection. Positive results are indicative of the presence of the identified virus, but do not rule out bacterial infection or co-infection with other pathogens not detected by the test. Clinical correlation with patient history and other diagnostic information is necessary to determine patient infection status. The expected result is Negative. Fact Sheet for Patients:  PinkCheek.be Fact Sheet for Healthcare Providers: GravelBags.it This test is  not yet approved or cleared by the [place] FDA and  has been authorized for detection and/or diagnosis of SARS-CoV-2 by FDA under an Emergency Use Authorization (EUA).  This EUA will remain in effect (meaning this test can be used) f or the duration of  the COVID-19 declaration under Section 564(b)(1) of the Act, 21 U.S.C. section 360bbb-3(b)(1), unless the authorization is terminated or revoked sooner.    Influenza A by PCR NEGATIVE NEGATIVE Final   Influenza B by PCR NEGATIVE NEGATIVE Final    Comment: (NOTE) The Xpert Xpress SARS-CoV-2/FLU/RSV assay is intended as an aid in  the diagnosis of influenza from Nasopharyngeal swab specimens and  should not be used as a sole basis for treatment. Nasal washings and  aspirates are unacceptable for Xpert Xpress SARS-CoV-2/FLU/RSV  testing. Fact Sheet for Patients: PinkCheek.be Fact Sheet for Healthcare Providers: GravelBags.it This test is not yet approved or cleared by  the [employer] and  has been authorized for detection and/or diagnosis of SARS-CoV-2 by  FDA under an Emergency Use Authorization (EUA). This EUA will remain  in effect (meaning this test can be used) for the duration of the  Covid-19 declaration under Section 564(b)(1) of the Act, 21  U.S.C. section 360bbb-3(b)(1), unless the authorization is  terminated or revoked. Performed at [facility], [address] [place], [place] [address]   Blood Culture (routine x 2)     Status: None (Preliminary result)   Collection Time: 09/19/19  2:15 AM   Specimen: BLOOD  Result Value Ref Range Status   Specimen Description BLOOD RIGHT ANTECUBITAL  Final   Special Requests   Final    BOTTLES DRAWN AEROBIC AND ANAEROBIC Blood Culture adequate volume   Culture   Final    NO GROWTH 1 DAY Performed at [facility], [address] [place], [place] [address]    Report Status PENDING  Incomplete   Blood Culture (routine x 2)     Status: None (Preliminary result)   Collection Time: 09/19/19  2:34 AM   Specimen: BLOOD  Result Value Ref Range Status   Specimen Description BLOOD RIGHT ARM  Final   Special Requests   Final    BOTTLES DRAWN AEROBIC AND ANAEROBIC Blood Culture adequate volume   Culture   Final    NO GROWTH 1 DAY Performed at [facility], [address] [place], [place] [address]    Report Status PENDING  Incomplete      Radiology Studies: DG Chest Port 1 View  Result Date: 09/19/2019 CLINICAL DATA:  86 year old female with vomiting. Positive COVID-19. EXAM: PORTABLE CHEST 1 VIEW COMPARISON:  Chest radiograph dated 09/16/2019. FINDINGS: Minimal bibasilar hazy density, likely atelectasis. No consolidative changes. There is no pleural effusion or pneumothorax. Stable cardiac silhouette. No acute osseous pathology. IMPRESSION: No active cardiopulmonary disease. Electronically Signed   By: Anner Crete M.D.   On: 09/19/2019 02:38   ECHOCARDIOGRAM COMPLETE  Result Date: 09/19/2019   ECHOCARDIOGRAM REPORT   Patient Name:   Jordan MCCOY Ellenbecker Date of Exam: 09/19/2019 Medical Rec #:  [id number]              Height:       65.0 in Accession #:    [id number]             Weight:       141.0 lb Date of Birth:  09/01/1930               BSA:          1.71 m Patient Age:    46 years               BP:           132/68 mmHg Patient Gender: F                      HR:           74 bpm. Exam Location:  [facility] Procedure: 2D Echo, Color Doppler and Cardiac Doppler Indications:     Elevated troponin  History:         Patient has no prior history of Echocardiogram examinations.                  Risk Factors:Hypertension and Diabetes.  Sonographer:     Sherrie Sport RDCS (AE) Referring Phys:  [id number] Mary Sella A MANSY Diagnosing  Phys: Yolonda Kida MD IMPRESSIONS  1. Left ventricular ejection fraction, by visual estimation, is 60 to 65%. The left ventricle has normal function. Left ventricular  septal wall thickness was normal. Normal left ventricular posterior wall thickness. There is no left ventricular hypertrophy.  2. Left ventricular diastolic parameters are consistent with Grade II diastolic dysfunction (pseudonormalization).  3. The left ventricle has no regional wall motion abnormalities.  4. Global right ventricle has normal systolic function.The right ventricular size is normal. No increase in right ventricular wall thickness.  5. Left atrial size was normal.  6. Right atrial size was normal.  7. The mitral valve is normal in structure. Trivial mitral valve regurgitation.  8. The tricuspid valve is normal in structure.  9. The aortic valve is normal in structure. Aortic valve regurgitation is not visualized. 10. The pulmonic valve was grossly normal. Pulmonic valve regurgitation is not visualized. FINDINGS  Left Ventricle: Left ventricular ejection fraction, by visual estimation, is 60 to 65%. The left ventricle has normal function. The left ventricle has no regional wall motion abnormalities. Normal left ventricular posterior wall thickness. There is no left ventricular hypertrophy. Left ventricular diastolic parameters are consistent with Grade II diastolic dysfunction (pseudonormalization). Right Ventricle: The right ventricular size is normal. No increase in right ventricular wall thickness. Global RV systolic function is has normal systolic function. Left Atrium: Left atrial size was normal in size. Right Atrium: Right atrial size was normal in size Pericardium: There is no evidence of pericardial effusion. Mitral Valve: The mitral valve is normal in structure. Trivial mitral valve regurgitation. Tricuspid Valve: The tricuspid valve is normal in structure. Tricuspid valve regurgitation mild-moderate. Aortic Valve: The aortic valve is normal in structure. Aortic valve regurgitation is not visualized. Aortic valve mean gradient measures 3.0 mmHg. Aortic valve peak gradient measures 5.1 mmHg.  Aortic valve area, by VTI measures 1.91 cm. Pulmonic Valve: The pulmonic valve was grossly normal. Pulmonic valve regurgitation is not visualized. Pulmonic regurgitation is not visualized. Aorta: The aortic root is normal in size and structure. IAS/Shunts: No atrial level shunt detected by color flow Doppler.  LEFT VENTRICLE PLAX 2D LVIDd:         3.44 cm  Diastology LVIDs:         2.18 cm  LV e' lateral:   4.68 cm/s LV PW:         1.31 cm  LV E/e' lateral: 13.8 LV IVS:        1.57 cm  LV e' medial:    4.46 cm/s LVOT diam:     2.10 cm  LV E/e' medial:  14.5 LV SV:         33 ml LV SV Index:   19.17 LVOT Area:     3.46 cm  LEFT ATRIUM           Index      RIGHT ATRIUM           Index LA diam:      2.20 cm 1.29 cm/m RA Area:     13.00 cm LA Vol (A4C): 12.7 ml 7.45 ml/m RA Volume:   31.80 ml  18.65 ml/m  AORTIC VALVE                   PULMONIC VALVE AV Area (Vmax):    1.70 cm    RVOT Peak grad: 2 mmHg AV Area (Vmean):   1.61 cm AV Area (VTI):     1.91 cm AV Vmax:  112.67 cm/s AV Vmean:          82.167 cm/s AV VTI:            0.254 m AV Peak Grad:      5.1 mmHg AV Mean Grad:      3.0 mmHg LVOT Vmax:         55.40 cm/s LVOT Vmean:        38.200 cm/s LVOT VTI:          0.140 m LVOT/AV VTI ratio: 0.55  AORTA Ao Root diam: 2.95 cm MITRAL VALVE MV Area (PHT): 2.69 cm             SHUNTS MV PHT:        81.78 msec           Systemic VTI:  0.14 m MV Decel Time: 282 msec             Systemic Diam: 2.10 cm MV E velocity: 64.50 cm/s 103 cm/s MV A velocity: 62.10 cm/s 70.3 cm/s MV E/A ratio:  1.04       1.5  Dwayne D Callwood MD Electronically signed by Yolonda Kida MD Signature Date/Time: 09/19/2019/12:36:17 PM    Final      Scheduled Meds: . vitamin C  500 mg Oral Daily  . aspirin EC  81 mg Oral Daily  . cholecalciferol  2,000 Units Oral Daily  . dexamethasone (DECADRON) injection  6 mg Intravenous Q24H  . famotidine  20 mg Oral Daily  . guaiFENesin  600 mg Oral BID  . lisinopril  20 mg Oral Daily    And  . hydrochlorothiazide  12.5 mg Oral Daily  . insulin aspart  0-20 Units Subcutaneous Q4H  . linagliptin  5 mg Oral Daily  . omega-3 acid ethyl esters  1 g Oral Daily  . pantoprazole  20 mg Oral Daily  . simvastatin  20 mg Oral q1800  . zinc sulfate  220 mg Oral Daily   Continuous Infusions: . sodium chloride 100 mL/hr at 09/20/19 1400  . azithromycin 500 mg (09/20/19 0923)  . cefTRIAXone (ROCEPHIN)  IV 2 g (09/20/19 0922)  . heparin 450 Units/hr (09/20/19 1400)  . remdesivir 100 mg in NS 100 mL 100 mg (09/20/19 1009)     LOS: 1 day     Enzo Bi, MD [facility] If 7PM-7AM, please contact night-coverage 09/20/2019, 3:18 PM

## 2019-09-21 LAB — CBC WITH DIFFERENTIAL/PLATELET
Abs Immature Granulocytes: 0.05 10*3/uL (ref 0.00–0.07)
Basophils Absolute: 0 10*3/uL (ref 0.0–0.1)
Basophils Relative: 0 %
Eosinophils Absolute: 0 10*3/uL (ref 0.0–0.5)
Eosinophils Relative: 0 %
HCT: 30.5 % — ABNORMAL LOW (ref 36.0–46.0)
Hemoglobin: 10.1 g/dL — ABNORMAL LOW (ref 12.0–15.0)
Immature Granulocytes: 1 %
Lymphocytes Relative: 7 %
Lymphs Abs: 0.7 10*3/uL (ref 0.7–4.0)
MCH: 25.8 pg — ABNORMAL LOW (ref 26.0–34.0)
MCHC: 33.1 g/dL (ref 30.0–36.0)
MCV: 78 fL — ABNORMAL LOW (ref 80.0–100.0)
Monocytes Absolute: 0.5 10*3/uL (ref 0.1–1.0)
Monocytes Relative: 6 %
Neutro Abs: 7.8 10*3/uL — ABNORMAL HIGH (ref 1.7–7.7)
Neutrophils Relative %: 86 %
Platelets: 149 10*3/uL — ABNORMAL LOW (ref 150–400)
RBC: 3.91 MIL/uL (ref 3.87–5.11)
RDW: 15 % (ref 11.5–15.5)
WBC: 9 10*3/uL (ref 4.0–10.5)
nRBC: 0 % (ref 0.0–0.2)

## 2019-09-21 LAB — COMPREHENSIVE METABOLIC PANEL
ALT: 19 U/L (ref 0–44)
AST: 30 U/L (ref 15–41)
Albumin: 2.8 g/dL — ABNORMAL LOW (ref 3.5–5.0)
Alkaline Phosphatase: 35 U/L — ABNORMAL LOW (ref 38–126)
Anion gap: 9 (ref 5–15)
BUN: 36 mg/dL — ABNORMAL HIGH (ref 8–23)
CO2: 21 mmol/L — ABNORMAL LOW (ref 22–32)
Calcium: 7.8 mg/dL — ABNORMAL LOW (ref 8.9–10.3)
Chloride: 111 mmol/L (ref 98–111)
Creatinine, Ser: 1.5 mg/dL — ABNORMAL HIGH (ref 0.44–1.00)
GFR calc Af Amer: 36 mL/min — ABNORMAL LOW (ref >60)
GFR calc non Af Amer: 31 mL/min — ABNORMAL LOW (ref >60)
Glucose, Bld: 185 mg/dL — ABNORMAL HIGH (ref 70–99)
Potassium: 4.5 mmol/L (ref 3.5–5.1)
Sodium: 141 mmol/L (ref 135–145)
Total Bilirubin: 0.4 mg/dL (ref 0.3–1.2)
Total Protein: 5.9 g/dL — ABNORMAL LOW (ref 6.5–8.1)

## 2019-09-21 LAB — FIBRIN DERIVATIVES D-DIMER (ARMC ONLY): Fibrin derivatives D-dimer (ARMC): 1487.06 ng/mL (FEU) — ABNORMAL HIGH (ref 0.00–499.00)

## 2019-09-21 LAB — GLUCOSE, CAPILLARY
Glucose-Capillary: 199 mg/dL — ABNORMAL HIGH (ref 70–99)
Glucose-Capillary: 169 mg/dL — ABNORMAL HIGH (ref 70–99)

## 2019-09-21 LAB — C-REACTIVE PROTEIN: CRP: 10.4 mg/dL — ABNORMAL HIGH (ref <1.0)

## 2019-09-21 LAB — FERRITIN: Ferritin: 745 ng/mL — ABNORMAL HIGH (ref 11–307)

## 2019-09-21 MED ORDER — DEXAMETHASONE 6 MG PO TABS: 6.0000 mg | ORAL_TABLET | Freq: Every day | ORAL | 0 refills | Status: AC

## 2019-09-21 NOTE — TOC Transition Note (Signed)
Transition of Care [facility]) - CM/SW Discharge Note   Patient Details  Name: [name] MRN: [id number] Date of Birth: Apr 20, 1930  Transition of [facility]) CM/SW Contact:  Shelbie Hutching, RN Phone Number: 09/21/2019, 10:48 AM   Clinical Narrative:    No discharge needs identified.  Patient will discharge back home with her husband.    Final next level of care: Home/Self Care Barriers to Discharge: Barriers Resolved   Patient Goals and CMS Choice Patient states their goals for this hospitalization and ongoing recovery are:: To get completely well      Discharge Placement                       Discharge Plan and Services                                     Social Determinants of Health (SDOH) Interventions     Readmission Risk Interventions No flowsheet data found.

## 2019-09-21 NOTE — Discharge Summary (Signed)
Physician Discharge Summary   Jordan Thornton  female DOB: 1929-12-30  QX:[id number]  PCP: Juline Patch, MD  Admit date: 09/19/2019 Discharge date: 09/21/2019  Admitted From: home Disposition:  home CODE STATUS: Full code  Discharge Instructions    Diet - low sodium heart healthy   Complete by: As directed    Discharge instructions   Complete by: As directed    Since you improved quickly and no longer need supplemental oxygen, you can go home to recover from your COVID infection.  I have prescribed you 7 days of steroid (Decadron) to help calm down the inflammation that COVID has caused in your body.  Pls follow the instruction on the prescription.  Dr. Enzo Bi -   Increase activity slowly   Complete by: As directed        Hospital Course:  For full details, please see H&P, progress notes, consult notes and ancillary notes.  Briefly,  [place] a86 y.o.AA femalewith a known history of type diabetes mellitus, GERD, dyslipidemia and hypertension, who presented to the emergency room with acute onset of worsening dyspnea with associated cough productive of grayish sputum and vomiting as well as fever and chills.     Acute hypoxemic respiratory failure secondary to COVID-19 PNA Had fever but no leukocytosis on presentation.  Pt needed 2L O2 on presentation, but quickly weaned down to RA, so I did not feel necessary to finish all 5 days of Remdesivir.  Pt received 3 days of Remdesivir and IV decadron.  Pt was discharged to finish 7 more days of PO Decadron at home.  Elevated procal CXR showed "Minimal bibasilar hazy density, likely atelectasis."  However, given fever, elevated procal 2.11, pt was started empirically on IV Rocephin and Zithromax for possible bacterial superinfection.  Pt received 3 days of IV ceftriaxone and azithromycin.  Elevated troponin likely 2/2 demand ischemia Trop 250 and flat.  Started on IV heparin gtt on presentation.  Cards  consulted and deemed "Probably represent demand ischemia" at this point.  TTE wnl.  Heparin gtt was then d/c'ed.    Type 2 diabetes mellitus Home Metformin and Januvia held during hospitalization.  Pt received SSI TID with meals while inpatient.  Pt was discharged on home diabetic regimen.  Stage IV chronic kidney disease, stable Cr 1.5-1.6, which is baseline.  GERD. Continued home PPI.  Dyslipidemia.  Continued on statin therapy.  Hypertension.  Continued home Zestoretic    Discharge Diagnoses:  Active Problems:   Acute hypoxemic respiratory failure due to COVID-19 [facility])    Discharge Instructions:  Allergies as of 09/21/2019   No Known Allergies     Medication List    TAKE these medications   Advocate Lancets Misc use once daily as directed to test blood sugar   Advocate Lancing Device Misc use once daily as directed to test blood sugar   dexamethasone 6 MG tablet Commonly known as: DECADRON Take 1 tablet (6 mg total) by mouth daily for 7 days. Steroid to treat your COVID. Start taking on: September 22, 2019   Elderberry 575 MG/5ML Syrp Take 1 Dose by mouth daily.   Fish Oil 1000 MG Caps Take 1 capsule by mouth daily.   FreeStyle [employer] use once daily as directed to test blood sugar   FREESTYLE LITE test strip Generic drug: glucose blood USE  STRIP TO CHECK GLUCOSE ONCE DAILY AS DIRECTED   ivermectin 3 MG Tabs tablet Commonly known as: STROMECTOL TAKE 4.5 TABLETS BY MOUTH  ONCE FOR 1 DOSE. TAKE 4.5 TABLETS TODAY AND 3 DAYS FROM NOW   lansoprazole 15 MG capsule Commonly known as: PREVACID Take 1 capsule (15 mg total) by mouth 1 day or 1 dose.   lisinopril-hydrochlorothiazide 20-12.5 MG tablet Commonly known as: ZESTORETIC Take 1 tablet by mouth daily. Dr Holley Raring   LORazepam 0.5 MG tablet Commonly known as: ATIVAN Take 1 tablet (0.5 mg total) by mouth as needed.   metFORMIN 500 MG tablet Commonly known as: GLUCOPHAGE Take 1 tablet (500 mg  total) by mouth 2 (two) times daily.   simvastatin 20 MG tablet Commonly known as: ZOCOR Take 1 tablet (20 mg total) by mouth daily at 6 PM.   sitaGLIPtin 100 MG tablet Commonly known as: Januvia Take 1 tablet (100 mg total) by mouth daily.   TYLENOL ARTHRITIS PAIN PO Take 1 tablet by mouth daily.   Vitamin D 50 MCG (2000 UT) Caps Take 1 capsule by mouth daily. otc       Follow-up Information    Juline Patch, MD Follow up in 1 week(s).   Specialty: Family Medicine Contact information: [address] [place] [address] [phone]           No Known Allergies   The results of significant diagnostics from this hospitalization (including imaging, microbiology, ancillary and laboratory) are listed below for reference.   Consultations: Cardiology   Procedures/Studies: DG Lumbar Spine Complete  Result Date: 09/11/2019 CLINICAL DATA:  Back pain after fall 6 months ago EXAM: LUMBAR SPINE - COMPLETE 4+ VIEW COMPARISON:  None. FINDINGS: 9 mm anterolisthesis L4 on L5. 4 mm anterolisthesis L5 on S1. Vertebral body heights are maintained without evidence of fracture. Mild lower lumbar disc space narrowing. Moderate facet arthropathy of the lower lumbar spine. Osseous structures appear demineralized. There is aortoiliac atherosclerotic calcifications. IMPRESSION: 1. Mild-to-moderate facet predominant degenerative changes of the lower lumbar spine including 9 mm anterolisthesis L4 on L5 and 4 mm anterolisthesis L5 on S1. 2. Vertebral body heights are maintained without evidence of acute fracture. Electronically Signed   By: Davina Poke D.O.   On: 09/11/2019 10:47   DG Chest Port 1 View  Result Date: 09/19/2019 CLINICAL DATA:  86 year old female with vomiting. Positive COVID-19. EXAM: PORTABLE CHEST 1 VIEW COMPARISON:  Chest radiograph dated 09/16/2019. FINDINGS: Minimal bibasilar hazy density, likely atelectasis. No consolidative changes. There is no pleural  effusion or pneumothorax. Stable cardiac silhouette. No acute osseous pathology. IMPRESSION: No active cardiopulmonary disease. Electronically Signed   By: Anner Crete M.D.   On: 09/19/2019 02:38   DG Chest Port 1 View  Result Date: 09/16/2019 CLINICAL DATA:  Recent syncopal episode EXAM: PORTABLE CHEST 1 VIEW COMPARISON:  None. FINDINGS: Cardiac shadows within normal limits. The lungs are well aerated bilaterally. No focal infiltrate or sizable effusion is seen. No acute bony abnormality is noted. IMPRESSION: No active disease. Electronically Signed   By: Inez Catalina M.D.   On: 09/16/2019 12:24   MM 3D SCREEN BREAST BILATERAL  Result Date: 09/01/2019 CLINICAL DATA:  Screening. EXAM: DIGITAL SCREENING BILATERAL MAMMOGRAM WITH TOMO AND CAD COMPARISON:  Previous exam(s). ACR Breast Density Category b: There are scattered areas of fibroglandular density. FINDINGS: There are no findings suspicious for malignancy. Images were processed with CAD. IMPRESSION: No mammographic evidence of malignancy. A result letter of this screening mammogram will be mailed directly to the patient. RECOMMENDATION: Screening mammogram in one year. (Code:SM-B-01Y) BI-RADS CATEGORY  1: Negative. Electronically Signed  By: Lillia Mountain M.D.   On: 09/01/2019 08:54   ECHOCARDIOGRAM COMPLETE  Result Date: 09/19/2019   ECHOCARDIOGRAM REPORT   Patient Name:   Jordan MCCOY Kovar Date of Exam: 09/19/2019 Medical Rec #:  [id number]              Height:       65.0 in Accession #:    [id number]             Weight:       141.0 lb Date of Birth:  [birth year]               BSA:          1.71 m Patient Age:    86 years               BP:           132/68 mmHg Patient Gender: F                      HR:           74 bpm. Exam Location:  [facility] Procedure: 2D Echo, Color Doppler and Cardiac Doppler Indications:     Elevated troponin  History:         Patient has no prior history of Echocardiogram examinations.                  Risk  Factors:Hypertension and Diabetes.  Sonographer:     Sherrie Sport RDCS (AE) Referring Phys:  [id number] Arvella Merles MANSY Diagnosing Phys: Yolonda Kida MD IMPRESSIONS  1. Left ventricular ejection fraction, by visual estimation, is 60 to 65%. The left ventricle has normal function. Left ventricular septal wall thickness was normal. Normal left ventricular posterior wall thickness. There is no left ventricular hypertrophy.  2. Left ventricular diastolic parameters are consistent with Grade II diastolic dysfunction (pseudonormalization).  3. The left ventricle has no regional wall motion abnormalities.  4. Global right ventricle has normal systolic function.The right ventricular size is normal. No increase in right ventricular wall thickness.  5. Left atrial size was normal.  6. Right atrial size was normal.  7. The mitral valve is normal in structure. Trivial mitral valve regurgitation.  8. The tricuspid valve is normal in structure.  9. The aortic valve is normal in structure. Aortic valve regurgitation is not visualized. 10. The pulmonic valve was grossly normal. Pulmonic valve regurgitation is not visualized. FINDINGS  Left Ventricle: Left ventricular ejection fraction, by visual estimation, is 60 to 65%. The left ventricle has normal function. The left ventricle has no regional wall motion abnormalities. Normal left ventricular posterior wall thickness. There is no left ventricular hypertrophy. Left ventricular diastolic parameters are consistent with Grade II diastolic dysfunction (pseudonormalization). Right Ventricle: The right ventricular size is normal. No increase in right ventricular wall thickness. Global RV systolic function is has normal systolic function. Left Atrium: Left atrial size was normal in size. Right Atrium: Right atrial size was normal in size Pericardium: There is no evidence of pericardial effusion. Mitral Valve: The mitral valve is normal in structure. Trivial mitral valve regurgitation.  Tricuspid Valve: The tricuspid valve is normal in structure. Tricuspid valve regurgitation mild-moderate. Aortic Valve: The aortic valve is normal in structure. Aortic valve regurgitation is not visualized. Aortic valve mean gradient measures 3.0 mmHg. Aortic valve peak gradient measures 5.1 mmHg. Aortic valve area, by VTI measures 1.91 cm. Pulmonic Valve: The pulmonic valve was grossly normal. Pulmonic valve  regurgitation is not visualized. Pulmonic regurgitation is not visualized. Aorta: The aortic root is normal in size and structure. IAS/Shunts: No atrial level shunt detected by color flow Doppler.  LEFT VENTRICLE PLAX 2D LVIDd:         3.44 cm  Diastology LVIDs:         2.18 cm  LV e' lateral:   4.68 cm/s LV PW:         1.31 cm  LV E/e' lateral: 13.8 LV IVS:        1.57 cm  LV e' medial:    4.46 cm/s LVOT diam:     2.10 cm  LV E/e' medial:  14.5 LV SV:         33 ml LV SV Index:   19.17 LVOT Area:     3.46 cm  LEFT ATRIUM           Index      RIGHT ATRIUM           Index LA diam:      2.20 cm 1.29 cm/m RA Area:     13.00 cm LA Vol (A4C): 12.7 ml 7.45 ml/m RA Volume:   31.80 ml  18.65 ml/m  AORTIC VALVE                   PULMONIC VALVE AV Area (Vmax):    1.70 cm    RVOT Peak grad: 2 mmHg AV Area (Vmean):   1.61 cm AV Area (VTI):     1.91 cm AV Vmax:           112.67 cm/s AV Vmean:          82.167 cm/s AV VTI:            0.254 m AV Peak Grad:      5.1 mmHg AV Mean Grad:      3.0 mmHg LVOT Vmax:         55.40 cm/s LVOT Vmean:        38.200 cm/s LVOT VTI:          0.140 m LVOT/AV VTI ratio: 0.55  AORTA Ao Root diam: 2.95 cm MITRAL VALVE MV Area (PHT): 2.69 cm             SHUNTS MV PHT:        81.78 msec           Systemic VTI:  0.14 m MV Decel Time: 282 msec             Systemic Diam: 2.10 cm MV E velocity: 64.50 cm/s 103 cm/s MV A velocity: 62.10 cm/s 70.3 cm/s MV E/A ratio:  1.04       1.5  Dwayne D Callwood MD Electronically signed by Yolonda Kida MD Signature Date/Time: 09/19/2019/12:36:17 PM     Final       Labs: BNP (last 3 results) No results for input(s): BNP in the last 8760 hours. Basic Metabolic Panel: Recent Labs  Lab 09/16/19 1147 09/18/19 1916 09/20/19 0819 09/21/19 0237  NA 143 139 144 141  K 3.9 3.8 4.1 4.5  CL 106 102 112* 111  CO2 24 24 20* 21*  GLUCOSE 134* 213* 109* 185*  BUN 21 24* 34* 36*  CREATININE 1.61* 1.65* 1.62* 1.50*  CALCIUM 9.0 8.5* 7.8* 7.8*   Liver Function Tests: Recent Labs  Lab 09/18/19 1916 09/20/19 0819 09/21/19 0237  AST 37 34 30  ALT 20 20 19   ALKPHOS 41  34* 35*  BILITOT 1.0 0.6 0.4  PROT 7.6 6.1* 5.9*  ALBUMIN 4.0 3.0* 2.8*   Recent Labs  Lab 09/18/19 1916  LIPASE 36   No results for input(s): AMMONIA in the last 168 hours. CBC: Recent Labs  Lab 09/16/19 1147 09/18/19 1916 09/20/19 0819 09/21/19 0237  WBC 6.6 5.6 9.9 9.0  NEUTROABS  --   --  8.6* 7.8*  HGB 11.5* 11.7* 10.4* 10.1*  HCT 35.1* 35.6* 31.2* 30.5*  MCV 78.2* 77.6* 78.0* 78.0*  PLT 158 154 155 149*   Cardiac Enzymes: No results for input(s): CKTOTAL, CKMB, CKMBINDEX, TROPONINI in the last 168 hours. BNP: Invalid input(s): POCBNP CBG: Recent Labs  Lab 09/20/19 0747 09/20/19 1153 09/20/19 1649 09/20/19 2127 09/21/19 0800  GLUCAP 89 199* 212* 227* 169*   D-Dimer Recent Labs    09/19/19 0234  DDIMER 2.57*   Hgb A1c No results for input(s): HGBA1C in the last 72 hours. Lipid Profile Recent Labs    09/19/19 0234  TRIG 94   Thyroid function studies No results for input(s): TSH, T4TOTAL, T3FREE, THYROIDAB in the last 72 hours.  Invalid input(s): FREET3 Anemia work up [employer]    09/20/19 0819 09/21/19 0237  FERRITIN 693* 745*   Urinalysis No results found for: COLORURINE, APPEARANCEUR, LABSPEC, [place], GLUCOSEU, [place], [place], [place], PROTEINUR, UROBILINOGEN, NITRITE, LEUKOCYTESUR Sepsis Labs Invalid input(s): PROCALCITONIN,  WBC,  LACTICIDVEN Microbiology Recent Results (from the past 240 hour(s))    Respiratory Panel by RT PCR (Flu A&B, Covid) - Nasopharyngeal Swab     Status: Abnormal   Collection Time: 09/16/19 12:30 PM   Specimen: Nasopharyngeal Swab  Result Value Ref Range Status   SARS Coronavirus 2 by RT PCR POSITIVE (A) NEGATIVE Final    Comment: RESULT CALLED TO, READ BACK BY AND VERIFIED WITH: ASHLEY MURRAY ON 09/16/19 AT 1403 QSD (NOTE) SARS-CoV-2 target nucleic acids are DETECTED. SARS-CoV-2 RNA is generally detectable in upper respiratory specimens  during the acute phase of infection. Positive results are indicative of the presence of the identified virus, but do not rule out bacterial infection or co-infection with other pathogens not detected by the test. Clinical correlation with patient history and other diagnostic information is necessary to determine patient infection status. The expected result is Negative. Fact Sheet for Patients:  PinkCheek.be Fact Sheet for Healthcare Providers: GravelBags.it This test is not yet approved or cleared by the [place] FDA and  has been authorized for detection and/or diagnosis of SARS-CoV-2 by FDA under an Emergency Use Authorization (EUA).  This EUA will remain in effect (meaning this test can be used) f or the duration of  the COVID-19 declaration under Section 564(b)(1) of the Act, 21 U.S.C. section 360bbb-3(b)(1), unless the authorization is terminated or revoked sooner.    Influenza A by PCR NEGATIVE NEGATIVE Final   Influenza B by PCR NEGATIVE NEGATIVE Final    Comment: (NOTE) The Xpert Xpress SARS-CoV-2/FLU/RSV assay is intended as an aid in  the diagnosis of influenza from Nasopharyngeal swab specimens and  should not be used as a sole basis for treatment. Nasal washings and  aspirates are unacceptable for Xpert Xpress SARS-CoV-2/FLU/RSV  testing. Fact Sheet for Patients: PinkCheek.be Fact Sheet for Healthcare  Providers: GravelBags.it This test is not yet approved or cleared by the [place] FDA and  has been authorized for detection and/or diagnosis of SARS-CoV-2 by  FDA under an Emergency Use Authorization (EUA). This EUA will remain  in effect (meaning this test can  be used) for the duration of the  Covid-19 declaration under Section 564(b)(1) of the Act, 21  U.S.C. section 360bbb-3(b)(1), unless the authorization is  terminated or revoked. Performed at [facility], [address] [place], [place] [address]   Blood Culture (routine x 2)     Status: None (Preliminary result)   Collection Time: 09/19/19  2:15 AM   Specimen: BLOOD  Result Value Ref Range Status   Specimen Description BLOOD RIGHT ANTECUBITAL  Final   Special Requests   Final    BOTTLES DRAWN AEROBIC AND ANAEROBIC Blood Culture adequate volume   Culture   Final    NO GROWTH 2 DAYS Performed at [facility], [address] [place], [place] [address]    Report Status PENDING  Incomplete  Blood Culture (routine x 2)     Status: None (Preliminary result)   Collection Time: 09/19/19  2:34 AM   Specimen: BLOOD  Result Value Ref Range Status   Specimen Description BLOOD RIGHT ARM  Final   Special Requests   Final    BOTTLES DRAWN AEROBIC AND ANAEROBIC Blood Culture adequate volume   Culture   Final    NO GROWTH 2 DAYS Performed at [facility], [address] [place], [place] [address]    Report Status PENDING  Incomplete     Total time spend on discharging this patient, including the last patient exam, discussing the hospital stay, instructions for ongoing care as it relates to all pertinent caregivers, as well as preparing the medical discharge records, prescriptions, and/or referrals as applicable, is 30 minutes.    Enzo Bi, MD  [facility] 09/21/2019, 10:17 PM  If 7PM-7AM, please contact night-coverage

## 2019-09-22 ENCOUNTER — Telehealth: Payer: Self-pay

## 2019-09-22 NOTE — Telephone Encounter (Signed)
Transition Care Management Follow-up Telephone Call  Date of discharge and from where: 09/21/19 [facility]  How have you been since you were released from the hospital? Pt states she is doing a little better, no longer having shortness of breath or coughing and resting to get her energy back. Pt also states Dr. Holley Raring called her to notify that kidneys have improved.   Any questions or concerns? No   Items Reviewed:  Did the pt receive and understand the discharge instructions provided? Yes   Medications obtained and verified? Yes   Any new allergies since your discharge? No   Dietary orders reviewed? Yes  Do you have support at home? Yes   Functional Questionnaire: (I = Independent and D = Dependent) ADLs: I  Bathing/Dressing- I  Meal Prep- I  Eating- I  Maintaining continence- I  Transferring/Ambulation- I  Managing Meds- I  Follow up appointments reviewed:   PCP Hospital f/u appt confirmed? Yes  Scheduled to see Dr. Ronnald Ramp on 09/28/19 @ 3:20. VIRTUAL VISIT.   Are transportation arrangements needed? No   If their condition worsens, is the pt aware to call PCP or go to the Emergency Dept.? Yes  Was the patient provided with contact information for the PCP's office or ED? Yes  Was to pt encouraged to call back with questions or concerns? Yes

## 2019-09-24 LAB — CULTURE, BLOOD (ROUTINE X 2)
Culture: NO GROWTH
Culture: NO GROWTH
Special Requests: ADEQUATE
Special Requests: ADEQUATE

## 2019-09-28 ENCOUNTER — Inpatient Hospital Stay: Payer: PPO | Admitting: Family Medicine

## 2019-10-09 ENCOUNTER — Encounter: Payer: Self-pay | Admitting: Family Medicine

## 2019-10-09 ENCOUNTER — Ambulatory Visit
Admission: RE | Admit: 2019-10-09 | Discharge: 2019-10-09 | Disposition: A | Discharge status: Home / Self Care | Payer: PPO | Source: Ambulatory Visit | Attending: Family Medicine | Admitting: Family Medicine

## 2019-10-09 ENCOUNTER — Ambulatory Visit (INDEPENDENT_AMBULATORY_CARE_PROVIDER_SITE_OTHER): Payer: PPO | Admitting: Family Medicine

## 2019-10-09 ENCOUNTER — Other Ambulatory Visit: Payer: Self-pay

## 2019-10-09 ENCOUNTER — Ambulatory Visit
Admission: RE | Admit: 2019-10-09 | Discharge: 2019-10-09 | Disposition: A | Discharge status: Home / Self Care | Payer: PPO | Attending: Family Medicine | Admitting: Family Medicine

## 2019-10-09 VITALS — BP 120/58 | HR 87 | Ht 65.0 in | Wt 126.0 lb

## 2019-10-09 DIAGNOSIS — Z09 Encounter for follow-up examination after completed treatment for conditions other than malignant neoplasm: Secondary | ICD-10-CM | POA: Insufficient documentation

## 2019-10-09 DIAGNOSIS — U071 COVID-19: Secondary | ICD-10-CM

## 2019-10-09 DIAGNOSIS — J1282 Pneumonia due to coronavirus disease 2019: Secondary | ICD-10-CM | POA: Insufficient documentation

## 2019-10-09 DIAGNOSIS — J1289 Other viral pneumonia: Secondary | ICD-10-CM | POA: Diagnosis not present

## 2019-10-09 DIAGNOSIS — R918 Other nonspecific abnormal finding of lung field: Secondary | ICD-10-CM | POA: Diagnosis not present

## 2019-10-09 NOTE — Progress Notes (Signed)
Date:  10/09/2019   Name:  Jordan Thornton   DOB:  May 10, 1930   MRN:  [id number]   Chief Complaint: No chief complaint on file.  Pt was recently admitted to [facility] for covid related pneumonia on 1/5and was discharged on 1/7. Transition of care call placed on 1/8.  Pneumonia There is no chest tightness, cough, difficulty breathing, frequent throat clearing, hemoptysis, hoarse voice, shortness of breath or wheezing. This is a new problem. The current episode started 1 to 4 weeks ago. The problem occurs daily. The problem has been gradually improving. Associated symptoms include weight loss. Pertinent negatives include no dyspnea on exertion, ear pain, fever, headaches, myalgias, orthopnea, PND, rhinorrhea, sneezing or sore throat. Her symptoms are aggravated by climbing stairs and any activity. Her symptoms are alleviated by oral steroids (remdesivir/steroids).    Lab Results  Component Value Date   CREATININE 1.50 (H) 09/21/2019   BUN 36 (H) 09/21/2019   NA 141 09/21/2019   K 4.5 09/21/2019   CL 111 09/21/2019   CO2 21 (L) 09/21/2019   Lab Results  Component Value Date   CHOL 170 05/04/2019   HDL 62 05/04/2019   LDLCALC 86 05/04/2019   TRIG 94 09/19/2019   CHOLHDL 2.8 03/02/2018   No results found for: TSH Lab Results  Component Value Date   HGBA1C 6.4 (H) 09/11/2019     Review of Systems  Constitutional: Positive for weight loss. Negative for chills, fatigue, fever and unexpected weight change.  HENT: Negative for congestion, ear discharge, ear pain, hoarse voice, rhinorrhea, sinus pressure, sneezing and sore throat.   Eyes: Negative for photophobia, pain, discharge, redness and itching.  Respiratory: Negative for cough, hemoptysis, shortness of breath, wheezing and stridor.   Cardiovascular: Negative for dyspnea on exertion and PND.  Gastrointestinal: Negative for abdominal pain, blood in stool, constipation, diarrhea, nausea and vomiting.  Endocrine:  Negative for cold intolerance, heat intolerance, polydipsia, polyphagia and polyuria.  Genitourinary: Negative for dysuria, flank pain, frequency, hematuria, menstrual problem, pelvic pain, urgency, vaginal bleeding and vaginal discharge.  Musculoskeletal: Negative for arthralgias, back pain and myalgias.  Skin: Negative for rash.  Allergic/Immunologic: Negative for environmental allergies and food allergies.  Neurological: Negative for dizziness, weakness, light-headedness, numbness and headaches.  Hematological: Negative for adenopathy. Does not bruise/bleed easily.  Psychiatric/Behavioral: Negative for dysphoric mood. The patient is not nervous/anxious.     Patient Active Problem List   Diagnosis Date Noted  . Acute hypoxemic respiratory failure due to COVID-19 ([place]) 09/19/2019  . Diabetes mellitus with no complication ([place]) 0000000  . Familial multiple lipoprotein-type hyperlipidemia 01/03/2015  . Creatinine elevation 01/03/2015  . Diabetes ([place]) 12/15/2014  . Hyperlipidemia 12/15/2014  . Hypertension 12/15/2014  . Gastro-esophageal reflux disease without esophagitis 12/15/2014    No Known Allergies  Past Surgical History:  Procedure Laterality Date  . bladder tack    . BREAST BIOPSY Left 10-15 yrs ago   benign  . CATARACT EXTRACTION Bilateral 05/2015  . CHOLECYSTECTOMY OPEN    . VAGINAL HYSTERECTOMY      Social History   Tobacco Use  . Smoking status: Former Smoker    Packs/day: 0.25    Years: 25.00    Pack years: 6.25    Types: Cigarettes    Quit date: 1982    Years since quitting: 39.0  . Smokeless tobacco: Never Used  . Tobacco comment: smoking cessation materials not required  Substance Use Topics  . Alcohol use: No  Alcohol/week: 0.0 standard drinks  . Drug use: No     Medication list has been reviewed and updated.  No outpatient medications have been marked as taking for the 10/09/19 encounter (Appointment) with Juline Patch, MD.    [facility] 2/9  Scores 09/11/2019 05/04/2019 02/13/2019 01/02/2019  PHQ - 2 Score 1 1 0 0  PHQ- 9 Score 2 1 - 0    BP Readings from Last 3 Encounters:  09/21/19 (!) 173/62  09/16/19 (!) 188/83  09/11/19 130/62    Physical Exam Vitals and nursing note reviewed.  Constitutional:      General: She is not in acute distress.    Appearance: She is not diaphoretic.  HENT:     Head: Normocephalic and atraumatic.     Right Ear: External ear normal.     Left Ear: External ear normal.     Nose: Nose normal.  Eyes:     General:        Right eye: No discharge.        Left eye: No discharge.     Conjunctiva/sclera: Conjunctivae normal.     Pupils: Pupils are equal, round, and reactive to light.  Neck:     Thyroid: No thyromegaly.     Vascular: No JVD.  Cardiovascular:     Rate and Rhythm: Normal rate and regular rhythm.     Heart sounds: Normal heart sounds, S1 normal and S2 normal. No murmur. No systolic murmur. No diastolic murmur. No friction rub. No gallop. No S3 or S4 sounds.   Pulmonary:     Effort: Pulmonary effort is normal.     Breath sounds: Normal breath sounds. No decreased air movement. No decreased breath sounds, wheezing, rhonchi or rales.  Abdominal:     General: Bowel sounds are normal.     Palpations: Abdomen is soft. There is no mass.     Tenderness: There is no abdominal tenderness. There is no guarding.  Musculoskeletal:        General: Normal range of motion.     Cervical back: Normal range of motion and neck supple.  Lymphadenopathy:     Cervical: No cervical adenopathy.  Skin:    General: Skin is warm and dry.  Neurological:     Mental Status: She is alert.     Deep Tendon Reflexes: Reflexes are normal and symmetric.     Wt Readings from Last 3 Encounters:  09/18/19 141 lb (64 kg)  09/16/19 141 lb (64 kg)  09/11/19 142 lb (64.4 kg)    There were no vitals taken for this visit.  Assessment and Plan: 1. Hospital discharge follow-up New onset.  Follow-up from  hospital.  Patient is doing better status post index of urine corticosteroids.  We will obtain a CBC and a BMP for baseline purposes.  Probably will not need to recheck but will hold pattern until we see results of labs and chest x-ray. - CBC with Differential/Platelet - DG Chest 2 View; Future - Basic metabolic panel  2. Pneumonia due to COVID-19 virus Patient is status post pneumonia treated in hospital for pneumonia of Covid origin.  Patient continues to do well with a pulse ox of 97%.  We will obtain a chest x-ray to evaluate current status. - CBC with Differential/Platelet - DG Chest 2 View; Future - Basic metabolic panel

## 2019-10-10 LAB — BASIC METABOLIC PANEL
BUN/Creatinine Ratio: 13 (ref 12–28)
BUN: 23 mg/dL (ref 8–27)
CO2: 21 mmol/L (ref 20–29)
Calcium: 9.2 mg/dL (ref 8.7–10.3)
Chloride: 107 mmol/L — ABNORMAL HIGH (ref 96–106)
Creatinine, Ser: 1.81 mg/dL — ABNORMAL HIGH (ref 0.57–1.00)
GFR calc Af Amer: 29 mL/min/{1.73_m2} — ABNORMAL LOW (ref >59)
GFR calc non Af Amer: 25 mL/min/{1.73_m2} — ABNORMAL LOW (ref >59)
Glucose: 108 mg/dL — ABNORMAL HIGH (ref 65–99)
Potassium: 5 mmol/L (ref 3.5–5.2)
Sodium: 145 mmol/L — ABNORMAL HIGH (ref 134–144)

## 2019-10-10 LAB — CBC WITH DIFFERENTIAL/PLATELET
Basophils Absolute: 0 10*3/uL (ref 0.0–0.2)
Basos: 0 %
EOS (ABSOLUTE): 0.1 10*3/uL (ref 0.0–0.4)
Eos: 1 %
Hematocrit: 31.7 % — ABNORMAL LOW (ref 34.0–46.6)
Hemoglobin: 10.6 g/dL — ABNORMAL LOW (ref 11.1–15.9)
Immature Grans (Abs): 0.1 10*3/uL (ref 0.0–0.1)
Immature Granulocytes: 1 %
Lymphocytes Absolute: 1.9 10*3/uL (ref 0.7–3.1)
Lymphs: 20 %
MCH: 26.8 pg (ref 26.6–33.0)
MCHC: 33.4 g/dL (ref 31.5–35.7)
MCV: 80 fL (ref 79–97)
Monocytes Absolute: 0.7 10*3/uL (ref 0.1–0.9)
Monocytes: 7 %
Neutrophils Absolute: 6.6 10*3/uL (ref 1.4–7.0)
Neutrophils: 71 %
Platelets: 85 10*3/uL — CL (ref 150–450)
RBC: 3.95 x10E6/uL (ref 3.77–5.28)
RDW: 14.8 % (ref 11.7–15.4)
WBC: 9.4 10*3/uL (ref 3.4–10.8)

## 2019-10-17 ENCOUNTER — Other Ambulatory Visit: Payer: PPO

## 2019-10-17 ENCOUNTER — Other Ambulatory Visit: Payer: Self-pay

## 2019-10-17 DIAGNOSIS — R7989 Other specified abnormal findings of blood chemistry: Secondary | ICD-10-CM | POA: Diagnosis not present

## 2019-10-17 DIAGNOSIS — J1282 Pneumonia due to coronavirus disease 2019: Secondary | ICD-10-CM | POA: Diagnosis not present

## 2019-10-17 DIAGNOSIS — U071 COVID-19: Secondary | ICD-10-CM | POA: Diagnosis not present

## 2019-10-17 NOTE — Progress Notes (Signed)
Printed labs

## 2019-10-18 LAB — CBC WITH DIFFERENTIAL/PLATELET
Basophils Absolute: 0 10*3/uL (ref 0.0–0.2)
Basos: 0 %
EOS (ABSOLUTE): 0.1 10*3/uL (ref 0.0–0.4)
Eos: 1 %
Hematocrit: 30 % — ABNORMAL LOW (ref 34.0–46.6)
Hemoglobin: 9.9 g/dL — ABNORMAL LOW (ref 11.1–15.9)
Immature Grans (Abs): 0 10*3/uL (ref 0.0–0.1)
Immature Granulocytes: 1 %
Lymphocytes Absolute: 1.3 10*3/uL (ref 0.7–3.1)
Lymphs: 23 %
MCH: 26 pg — ABNORMAL LOW (ref 26.6–33.0)
MCHC: 33 g/dL (ref 31.5–35.7)
MCV: 79 fL (ref 79–97)
Monocytes Absolute: 0.4 10*3/uL (ref 0.1–0.9)
Monocytes: 8 %
Neutrophils Absolute: 3.8 10*3/uL (ref 1.4–7.0)
Neutrophils: 67 %
Platelets: 200 10*3/uL (ref 150–450)
RBC: 3.81 x10E6/uL (ref 3.77–5.28)
RDW: 15.2 % (ref 11.7–15.4)
WBC: 5.6 10*3/uL (ref 3.4–10.8)

## 2019-10-18 LAB — RENAL FUNCTION PANEL
Albumin: 3.7 g/dL (ref 3.6–4.6)
BUN/Creatinine Ratio: 11 — ABNORMAL LOW (ref 12–28)
BUN: 19 mg/dL (ref 8–27)
CO2: 21 mmol/L (ref 20–29)
Calcium: 8.7 mg/dL (ref 8.7–10.3)
Chloride: 108 mmol/L — ABNORMAL HIGH (ref 96–106)
Creatinine, Ser: 1.66 mg/dL — ABNORMAL HIGH (ref 0.57–1.00)
GFR calc Af Amer: 32 mL/min/{1.73_m2} — ABNORMAL LOW (ref >59)
GFR calc non Af Amer: 28 mL/min/{1.73_m2} — ABNORMAL LOW (ref >59)
Glucose: 115 mg/dL — ABNORMAL HIGH (ref 65–99)
Phosphorus: 3.7 mg/dL (ref 3.0–4.3)
Potassium: 4.9 mmol/L (ref 3.5–5.2)
Sodium: 147 mmol/L — ABNORMAL HIGH (ref 134–144)

## 2019-11-10 ENCOUNTER — Other Ambulatory Visit: Payer: Self-pay

## 2019-11-10 ENCOUNTER — Ambulatory Visit: Payer: PPO | Attending: Internal Medicine

## 2019-11-10 DIAGNOSIS — Z23 Encounter for immunization: Secondary | ICD-10-CM | POA: Insufficient documentation

## 2019-11-10 NOTE — Progress Notes (Signed)
   Covid-19 Vaccination Clinic  Name:  Jordan Thornton    MRN: [id number] DOB: [birth year]  11/10/2019  [name] was observed post Covid-19 immunization for 15 minutes without incidence. She was provided with Vaccine Information Sheet and instruction to access the V-Safe system.   [name] was instructed to call 911 with any severe reactions post vaccine: Marland Kitchen Difficulty breathing  . Swelling of your face and throat  . A fast heartbeat  . A bad rash all over your body  . Dizziness and weakness    Immunizations Administered    Name Date Dose VIS Date Route   Pfizer COVID-19 Vaccine 11/10/2019  9:02 AM 0.3 mL 08/25/2019 Intramuscular   Manufacturer: [employer]   Lot: [id number]   [place]: [id number]

## 2019-11-17 ENCOUNTER — Encounter: Payer: Self-pay | Admitting: Family Medicine

## 2019-11-17 ENCOUNTER — Other Ambulatory Visit: Payer: Self-pay

## 2019-11-17 ENCOUNTER — Ambulatory Visit (INDEPENDENT_AMBULATORY_CARE_PROVIDER_SITE_OTHER): Payer: PPO | Admitting: Family Medicine

## 2019-11-17 VITALS — BP 124/72 | HR 80 | Temp 98.4°F | Resp 12 | Ht 65.0 in | Wt 138.0 lb

## 2019-11-17 DIAGNOSIS — K219 Gastro-esophageal reflux disease without esophagitis: Secondary | ICD-10-CM

## 2019-11-17 DIAGNOSIS — E119 Type 2 diabetes mellitus without complications: Secondary | ICD-10-CM | POA: Diagnosis not present

## 2019-11-17 DIAGNOSIS — I10 Essential (primary) hypertension: Secondary | ICD-10-CM

## 2019-11-17 DIAGNOSIS — E782 Mixed hyperlipidemia: Secondary | ICD-10-CM

## 2019-11-17 MED ORDER — LANSOPRAZOLE 15 MG PO CPDR: 15.0000 mg | DELAYED_RELEASE_CAPSULE | ORAL | 1 refills | Status: DC

## 2019-11-17 MED ORDER — FISH OIL 1000 MG PO CAPS: 1.0000 | ORAL_CAPSULE | Freq: Every day | ORAL | 3 refills | Status: DC

## 2019-11-17 MED ORDER — LISINOPRIL-HYDROCHLOROTHIAZIDE 20-12.5 MG PO TABS: 1.0000 | ORAL_TABLET | Freq: Every day | ORAL | 1 refills | Status: DC

## 2019-11-17 MED ORDER — SITAGLIPTIN PHOSPHATE 100 MG PO TABS: 100.0000 mg | ORAL_TABLET | Freq: Every day | ORAL | 1 refills | Status: DC

## 2019-11-17 MED ORDER — METFORMIN HCL 500 MG PO TABS: 500.0000 mg | ORAL_TABLET | Freq: Two times a day (BID) | ORAL | 1 refills | Status: DC

## 2019-11-17 MED ORDER — SIMVASTATIN 20 MG PO TABS: 20.0000 mg | ORAL_TABLET | Freq: Every day | ORAL | 1 refills | Status: DC

## 2019-11-17 NOTE — Progress Notes (Signed)
Date:  11/17/2019   Name:  Jordan Thornton   DOB:  1930-04-07   MRN:  [id number]   Chief Complaint: Diabetes, Hypertension, and Hyperlipidemia  Diabetes She presents for her follow-up diabetic visit. She has type 2 diabetes mellitus. Her disease course has been stable. There are no hypoglycemic associated symptoms. Pertinent negatives for hypoglycemia include no dizziness, headaches, nervousness/anxiousness or sweats. Pertinent negatives for diabetes include no blurred vision, no chest pain, no fatigue, no foot paresthesias, no foot ulcerations, no polydipsia, no polyphagia, no polyuria, no visual change, no weakness and no weight loss. There are no hypoglycemic complications. Symptoms are stable. There are no diabetic complications. Pertinent negatives for diabetic complications include no CVA, PVD or retinopathy. Risk factors for coronary artery disease include diabetes mellitus, dyslipidemia and hypertension. Current diabetic treatment includes oral agent (dual therapy). She is compliant with treatment all of the time. She is following a generally healthy diet. Meal planning includes avoidance of concentrated sweets. There is no change in her home blood glucose trend. Her breakfast blood glucose is taken between 8-9 am. Her breakfast blood glucose range is generally 90-110 mg/dl. An ACE inhibitor/angiotensin II receptor blocker is being taken. She does not see a podiatrist.Eye exam is current.  Hypertension This is a chronic problem. The current episode started more than 1 year ago. The problem has been gradually improving since onset. The problem is controlled. Pertinent negatives include no anxiety, blurred vision, chest pain, headaches, malaise/fatigue, neck pain, orthopnea, palpitations, peripheral edema, PND, shortness of breath or sweats. There are no associated agents to hypertension. Risk factors for coronary artery disease include dyslipidemia. Past treatments include ACE inhibitors and  diuretics. The current treatment provides moderate improvement. There is no history of angina, kidney disease, CAD/MI, CVA, heart failure, left ventricular hypertrophy, PVD or retinopathy. There is no history of chronic renal disease, a hypertension causing med or renovascular disease.  Hyperlipidemia This is a chronic problem. The current episode started more than 1 year ago. The problem is controlled. Recent lipid tests were reviewed and are normal. She has no history of chronic renal disease. Factors aggravating her hyperlipidemia include thiazides. Pertinent negatives include no chest pain, focal sensory loss, focal weakness, leg pain, myalgias or shortness of breath. Current antihyperlipidemic treatment includes statins. There are no compliance problems.   Gastroesophageal Reflux She reports no abdominal pain, no chest pain, no coughing, no dysphagia, no early satiety, no heartburn, no nausea, no sore throat or no wheezing. This is a recurrent problem. The problem has been gradually improving. The symptoms are aggravated by certain foods. Pertinent negatives include no fatigue or weight loss.    Lab Results  Component Value Date   CREATININE 1.66 (H) 10/17/2019   BUN 19 10/17/2019   NA 147 (H) 10/17/2019   K 4.9 10/17/2019   CL 108 (H) 10/17/2019   CO2 21 10/17/2019   Lab Results  Component Value Date   CHOL 170 05/04/2019   HDL 62 05/04/2019   LDLCALC 86 05/04/2019   TRIG 94 09/19/2019   CHOLHDL 2.8 03/02/2018   No results found for: TSH Lab Results  Component Value Date   HGBA1C 6.4 (H) 09/11/2019     Review of Systems  Constitutional: Negative.  Negative for chills, fatigue, fever, malaise/fatigue, unexpected weight change and weight loss.  HENT: Negative for congestion, ear discharge, ear pain, rhinorrhea, sinus pressure, sneezing and sore throat.   Eyes: Negative for blurred vision, photophobia, pain, discharge, redness and itching.  Respiratory: Negative for cough,  shortness of breath, wheezing and stridor.   Cardiovascular: Negative for chest pain, palpitations, orthopnea and PND.  Gastrointestinal: Negative for abdominal pain, blood in stool, constipation, diarrhea, dysphagia, heartburn, nausea and vomiting.  Endocrine: Negative for cold intolerance, heat intolerance, polydipsia, polyphagia and polyuria.  Genitourinary: Negative for dysuria, flank pain, frequency, hematuria, menstrual problem, pelvic pain, urgency, vaginal bleeding and vaginal discharge.  Musculoskeletal: Negative for arthralgias, back pain, myalgias and neck pain.  Skin: Negative for rash.  Allergic/Immunologic: Negative for environmental allergies and food allergies.  Neurological: Negative for dizziness, focal weakness, weakness, light-headedness, numbness and headaches.  Hematological: Negative for adenopathy. Does not bruise/bleed easily.  Psychiatric/Behavioral: Negative for dysphoric mood. The patient is not nervous/anxious.     Patient Active Problem List   Diagnosis Date Noted  . Acute hypoxemic respiratory failure due to COVID-19 ([place]) 09/19/2019  . Diabetes mellitus with no complication ([place]) 37/34/2876  . Familial multiple lipoprotein-type hyperlipidemia 01/03/2015  . Creatinine elevation 01/03/2015  . Diabetes ([place]) 12/15/2014  . Hyperlipidemia 12/15/2014  . Hypertension 12/15/2014  . Gastro-esophageal reflux disease without esophagitis 12/15/2014    No Known Allergies  Past Surgical History:  Procedure Laterality Date  . bladder tack    . BREAST BIOPSY Left 10-15 yrs ago   benign  . CATARACT EXTRACTION Bilateral 05/2015  . CHOLECYSTECTOMY OPEN    . VAGINAL HYSTERECTOMY      Social History   Tobacco Use  . Smoking status: Former Smoker    Packs/day: 0.25    Years: 25.00    Pack years: 6.25    Types: Cigarettes    Quit date: 1982    Years since quitting: 39.2  . Smokeless tobacco: Never Used  . Tobacco comment: smoking cessation materials not  required  Substance Use Topics  . Alcohol use: No    Alcohol/week: 0.0 standard drinks  . Drug use: No     Medication list has been reviewed and updated.  Current Meds  Medication Sig  . Acetaminophen (TYLENOL ARTHRITIS PAIN PO) Take 1 tablet by mouth daily.  Marland Kitchen ADVOCATE LANCETS MISC use once daily as directed to test blood sugar  . Blood Glucose Monitoring Suppl (FREESTYLE LITE) DEVI use once daily as directed to test blood sugar  . Cholecalciferol (VITAMIN D) 2000 units CAPS Take 1 capsule by mouth daily. otc  . Elderberry 575 MG/5ML SYRP Take 1 Dose by mouth daily.   Marland Kitchen FREESTYLE LITE test strip USE  STRIP TO CHECK GLUCOSE ONCE DAILY AS DIRECTED  . Lancet Devices (ADVOCATE LANCING DEVICE) MISC use once daily as directed to test blood sugar  . lansoprazole (PREVACID) 15 MG capsule Take 1 capsule (15 mg total) by mouth 1 day or 1 dose.  Marland Kitchen lisinopril-hydrochlorothiazide (ZESTORETIC) 20-12.5 MG tablet Take 1 tablet by mouth daily. Dr Holley Raring  . LORazepam (ATIVAN) 0.5 MG tablet Take 1 tablet (0.5 mg total) by mouth as needed.  . metFORMIN (GLUCOPHAGE) 500 MG tablet Take 1 tablet (500 mg total) by mouth 2 (two) times daily.  . Omega-3 Fatty Acids (FISH OIL) 1000 MG CAPS Take 1 capsule by mouth daily.   . simvastatin (ZOCOR) 20 MG tablet Take 1 tablet (20 mg total) by mouth daily at 6 PM.  . sitaGLIPtin (JANUVIA) 100 MG tablet Take 1 tablet (100 mg total) by mouth daily.    PHQ 2/9 Scores 11/17/2019 10/09/2019 09/11/2019 05/04/2019  PHQ - 2 Score 0 0 1 1  PHQ- 9 Score 0 0 2 1  BP Readings from Last 3 Encounters:  11/17/19 124/72  10/09/19 (!) 120/58  09/21/19 (!) 173/62    Physical Exam Vitals and nursing note reviewed.  Constitutional:      Appearance: She is well-developed.  HENT:     Head: Normocephalic.     Right Ear: Tympanic membrane, ear canal and external ear normal. There is no impacted cerumen.     Left Ear: Tympanic membrane, ear canal and external ear normal. There is  no impacted cerumen.     Nose: Nose normal.     Mouth/Throat:     Mouth: Mucous membranes are moist.  Eyes:     General: Lids are everted, no foreign bodies appreciated. No scleral icterus.       Left eye: No foreign body or hordeolum.     Conjunctiva/sclera: Conjunctivae normal.     Right eye: Right conjunctiva is not injected.     Left eye: Left conjunctiva is not injected.     Pupils: Pupils are equal, round, and reactive to light.  Neck:     Thyroid: No thyromegaly.     Vascular: No JVD.     Trachea: No tracheal deviation.  Cardiovascular:     Rate and Rhythm: Normal rate and regular rhythm.     Heart sounds: Normal heart sounds, S1 normal and S2 normal. No murmur. No systolic murmur. No diastolic murmur. No friction rub. No gallop. No S3 or S4 sounds.   Pulmonary:     Effort: Pulmonary effort is normal. No respiratory distress.     Breath sounds: Normal breath sounds. No decreased breath sounds, wheezing, rhonchi or rales.  Abdominal:     General: Bowel sounds are normal.     Palpations: Abdomen is soft. There is no mass.     Tenderness: There is no abdominal tenderness. There is no guarding or rebound.  Musculoskeletal:        General: No tenderness. Normal range of motion.     Cervical back: Normal range of motion and neck supple.     Right lower leg: No edema.     Left lower leg: No edema.  Lymphadenopathy:     Cervical: No cervical adenopathy.  Skin:    General: Skin is warm.     Capillary Refill: Capillary refill takes less than 2 seconds.     Findings: No rash.  Neurological:     Mental Status: She is alert and oriented to person, place, and time.     Cranial Nerves: No cranial nerve deficit.     Deep Tendon Reflexes: Reflexes normal.  Psychiatric:        Mood and Affect: Mood is not anxious or depressed.     Wt Readings from Last 3 Encounters:  11/17/19 138 lb (62.6 kg)  10/09/19 126 lb (57.2 kg)  09/18/19 141 lb (64 kg)    BP 124/72   Pulse 80   Ht 5'  5" (1.651 m)   Wt 138 lb (62.6 kg)   BMI 22.96 kg/m   Assessment and Plan:  1. Diabetes mellitus with no complication (HCC) Chronic.  Controlled.  Stable.  Continue Januvia 100 mg once a day and Metformin 500 mg one twice a day.  Review of A1c and renal panel is acceptable and we will recheck patient in 4 months. - sitaGLIPtin (JANUVIA) 100 MG tablet; Take 1 tablet (100 mg total) by mouth daily.  Dispense: 90 tablet; Refill: 1 - metFORMIN (GLUCOPHAGE) 500 MG tablet; Take 1 tablet (500 mg  total) by mouth 2 (two) times daily.  Dispense: 180 tablet; Refill: 1  2. Essential hypertension Chronic.  Controlled.  Stable.  Patient is also followed by Dr. Holley Raring we will be continuing lisinopril hydrochlorothiazide 20-12 0.5 once a day. - lisinopril-hydrochlorothiazide (ZESTORETIC) 20-12.5 MG tablet; Take 1 tablet by mouth daily. Dr Holley Raring  Dispense: 90 tablet; Refill: 1  3. Gastro-esophageal reflux disease without esophagitis Chronic.  Controlled.  Stable.  Continue lansoprazole 15 mg once a day. - lansoprazole (PREVACID) 15 MG capsule; Take 1 capsule (15 mg total) by mouth 1 day or 1 dose.  Dispense: 90 capsule; Refill: 1  4. Mixed hyperlipidemia Chronic.  Controlled.  Stable.  Continue simvastatin 20 mg once a day and omega-3 1000 mg daily.  Will check lipid panel with ratio. - simvastatin (ZOCOR) 20 MG tablet; Take 1 tablet (20 mg total) by mouth daily at 6 PM.  Dispense: 90 tablet; Refill: 1 - Omega-3 Fatty Acids (FISH OIL) 1000 MG CAPS; Take 1 capsule (1,000 mg total) by mouth daily.  Dispense: 100 capsule; Refill: 3 - Lipid Panel With LDL/HDL Ratio  Incidentally patient is doing well status post Covid infection with hypoxia episode.  Pulse ox today is 99% with normal respiratory and pulmonary exam.  Patient is doing well post Covid infection and has received her vaccination.

## 2019-11-18 LAB — LIPID PANEL WITH LDL/HDL RATIO
Cholesterol, Total: 193 mg/dL (ref 100–199)
HDL: 55 mg/dL (ref >39)
LDL Chol Calc (NIH): 117 mg/dL — ABNORMAL HIGH (ref 0–99)
LDL/HDL Ratio: 2.1 ratio (ref 0.0–3.2)
Triglycerides: 116 mg/dL (ref 0–149)
VLDL Cholesterol Cal: 21 mg/dL (ref 5–40)

## 2019-12-06 ENCOUNTER — Ambulatory Visit: Payer: PPO | Attending: Internal Medicine

## 2019-12-06 DIAGNOSIS — Z23 Encounter for immunization: Secondary | ICD-10-CM

## 2019-12-06 NOTE — Progress Notes (Signed)
   Covid-19 Vaccination Clinic  Name:  Jordan Thornton    MRN: [id number] DOB: 01-Jan-1930  12/06/2019  Jordan Thornton was observed post Covid-19 immunization for 15 minutes without incident. She was provided with Vaccine Information Sheet and instruction to access the V-Safe system.   Jordan Thornton was instructed to call 911 with any severe reactions post vaccine: Marland Kitchen Difficulty breathing  . Swelling of face and throat  . A fast heartbeat  . A bad rash all over body  . Dizziness and weakness   Immunizations Administered    Name Date Dose VIS Date Route   Pfizer COVID-19 Vaccine 12/06/2019 10:08 AM 0.3 mL 08/25/2019 Intramuscular   Manufacturer: [employer]   Lot: [id number]   [place]: [id number]

## 2020-01-01 ENCOUNTER — Ambulatory Visit (INDEPENDENT_AMBULATORY_CARE_PROVIDER_SITE_OTHER): Payer: PPO | Admitting: Family Medicine

## 2020-01-01 ENCOUNTER — Encounter: Payer: Self-pay | Admitting: Family Medicine

## 2020-01-01 ENCOUNTER — Other Ambulatory Visit: Payer: Self-pay

## 2020-01-01 VITALS — BP 118/72 | HR 72 | Temp 97.7°F | Ht 65.0 in | Wt 138.0 lb

## 2020-01-01 DIAGNOSIS — N3090 Cystitis, unspecified without hematuria: Secondary | ICD-10-CM

## 2020-01-01 LAB — POCT URINALYSIS DIPSTICK
Bilirubin, UA: NEGATIVE
Glucose, UA: NEGATIVE
Ketones, UA: NEGATIVE
Nitrite, UA: NEGATIVE
Protein, UA: NEGATIVE
Spec Grav, UA: 1.01 (ref 1.010–1.025)
Urobilinogen, UA: 0.2 E.U./dL
pH, UA: 7 (ref 5.0–8.0)

## 2020-01-01 MED ORDER — CEPHALEXIN 500 MG PO CAPS: 500.0000 mg | ORAL_CAPSULE | Freq: Four times a day (QID) | ORAL | 0 refills | Status: DC

## 2020-01-01 NOTE — Progress Notes (Signed)
Date:  01/01/2020   Name:  Jordan Thornton   DOB:  [birth year]   MRN:  [id number]   Chief Complaint: Urinary Tract Infection (frequent urination with pain at the end of stream)  Urinary Tract Infection  This is a new problem. The current episode started in the past 7 days (Thursday). The problem occurs every urination. The problem has been waxing and waning. The quality of the pain is described as burning. The pain is at a severity of 4/10. The pain is mild. There has been no fever. Associated symptoms include frequency, nausea and urgency. Pertinent negatives include no chills, discharge, flank pain, hematuria, hesitancy, sweats or vomiting. Associated symptoms comments: nocturia. She has tried increased fluids (cranberry juice) for the symptoms. The treatment provided no relief. There is no history of catheterization, kidney stones, recurrent UTIs, a single kidney, urinary stasis or a urological procedure.    Lab Results  Component Value Date   CREATININE 1.66 (H) 10/17/2019   BUN 19 10/17/2019   NA 147 (H) 10/17/2019   K 4.9 10/17/2019   CL 108 (H) 10/17/2019   CO2 21 10/17/2019   Lab Results  Component Value Date   CHOL 193 11/17/2019   HDL 55 11/17/2019   LDLCALC 117 (H) 11/17/2019   TRIG 116 11/17/2019   CHOLHDL 2.8 03/02/2018   No results found for: TSH Lab Results  Component Value Date   HGBA1C 6.4 (H) 09/11/2019   Lab Results  Component Value Date   WBC 5.6 10/17/2019   HGB 9.9 (L) 10/17/2019   HCT 30.0 (L) 10/17/2019   MCV 79 10/17/2019   PLT 200 10/17/2019   Lab Results  Component Value Date   ALT 19 09/21/2019   AST 30 09/21/2019   ALKPHOS 35 (L) 09/21/2019   BILITOT 0.4 09/21/2019     Review of Systems  Constitutional: Negative.  Negative for chills, fatigue, fever and unexpected weight change.  HENT: Negative for congestion, ear discharge, ear pain, rhinorrhea, sinus pressure, sneezing and sore throat.   Eyes: Negative for photophobia, pain,  discharge, redness and itching.  Respiratory: Negative for cough, shortness of breath, wheezing and stridor.   Gastrointestinal: Positive for nausea. Negative for abdominal pain, blood in stool, constipation, diarrhea and vomiting.  Endocrine: Negative for cold intolerance, heat intolerance, polydipsia, polyphagia and polyuria.  Genitourinary: Positive for frequency and urgency. Negative for dysuria, flank pain, hematuria, hesitancy, menstrual problem, pelvic pain, vaginal bleeding and vaginal discharge.  Musculoskeletal: Negative for arthralgias, back pain and myalgias.  Skin: Negative for rash.  Allergic/Immunologic: Negative for environmental allergies and food allergies.  Neurological: Negative for dizziness, weakness, light-headedness, numbness and headaches.  Hematological: Negative for adenopathy. Does not bruise/bleed easily.  Psychiatric/Behavioral: Negative for dysphoric mood. The patient is not nervous/anxious.     Patient Active Problem List   Diagnosis Date Noted  . Acute hypoxemic respiratory failure due to COVID-19 ([place]) 09/19/2019  . Diabetes mellitus with no complication ([place]) 02/40/9735  . Familial multiple lipoprotein-type hyperlipidemia 01/03/2015  . Creatinine elevation 01/03/2015  . Diabetes ([place]) 12/15/2014  . Hyperlipidemia 12/15/2014  . Hypertension 12/15/2014  . Gastro-esophageal reflux disease without esophagitis 12/15/2014    No Known Allergies  Past Surgical History:  Procedure Laterality Date  . bladder tack    . BREAST BIOPSY Left 10-15 yrs ago   benign  . CATARACT EXTRACTION Bilateral 05/2015  . CHOLECYSTECTOMY OPEN    . VAGINAL HYSTERECTOMY      Social History   Tobacco  Use  . Smoking status: Former Smoker    Packs/day: 0.25    Years: 25.00    Pack years: 6.25    Types: Cigarettes    Quit date: 1982    Years since quitting: 39.3  . Smokeless tobacco: Never Used  . Tobacco comment: smoking cessation materials not required  Substance Use  Topics  . Alcohol use: No    Alcohol/week: 0.0 standard drinks  . Drug use: No     Medication list has been reviewed and updated.  Current Meds  Medication Sig  . Acetaminophen (TYLENOL ARTHRITIS PAIN PO) Take 1 tablet by mouth daily.  Marland Kitchen ADVOCATE LANCETS MISC use once daily as directed to test blood sugar  . Blood Glucose Monitoring Suppl (FREESTYLE LITE) DEVI use once daily as directed to test blood sugar  . Cholecalciferol (VITAMIN D) 2000 units CAPS Take 1 capsule by mouth daily. otc  . Elderberry 575 MG/5ML SYRP Take 1 Dose by mouth daily.   Marland Kitchen FREESTYLE LITE test strip USE  STRIP TO CHECK GLUCOSE ONCE DAILY AS DIRECTED  . Lancet Devices (ADVOCATE LANCING DEVICE) MISC use once daily as directed to test blood sugar  . lansoprazole (PREVACID) 15 MG capsule Take 1 capsule (15 mg total) by mouth 1 day or 1 dose.  Marland Kitchen lisinopril-hydrochlorothiazide (ZESTORETIC) 20-12.5 MG tablet Take 1 tablet by mouth daily. Dr Holley Raring  . LORazepam (ATIVAN) 0.5 MG tablet Take 1 tablet (0.5 mg total) by mouth as needed.  . metFORMIN (GLUCOPHAGE) 500 MG tablet Take 1 tablet (500 mg total) by mouth 2 (two) times daily.  . Omega-3 Fatty Acids (FISH OIL) 1000 MG CAPS Take 1 capsule (1,000 mg total) by mouth daily.  . simvastatin (ZOCOR) 20 MG tablet Take 1 tablet (20 mg total) by mouth daily at 6 PM.  . sitaGLIPtin (JANUVIA) 100 MG tablet Take 1 tablet (100 mg total) by mouth daily.    PHQ 2/9 Scores 01/01/2020 11/17/2019 10/09/2019 09/11/2019  PHQ - 2 Score 1 0 0 1  PHQ- 9 Score 1 0 0 2    BP Readings from Last 3 Encounters:  01/01/20 118/72  11/17/19 124/72  10/09/19 (!) 120/58    Physical Exam Vitals and nursing note reviewed.  Constitutional:      General: She is not in acute distress.    Appearance: She is not diaphoretic.  HENT:     Head: Normocephalic and atraumatic.     Right Ear: External ear normal.     Left Ear: External ear normal.     Nose: Nose normal.  Eyes:     General:         Right eye: No discharge.        Left eye: No discharge.     Conjunctiva/sclera: Conjunctivae normal.     Pupils: Pupils are equal, round, and reactive to light.  Neck:     Thyroid: No thyromegaly.     Vascular: No JVD.  Cardiovascular:     Rate and Rhythm: Normal rate and regular rhythm.     Heart sounds: Normal heart sounds. No murmur. No friction rub. No gallop.   Pulmonary:     Effort: Pulmonary effort is normal.     Breath sounds: Normal breath sounds.  Abdominal:     General: Bowel sounds are normal.     Palpations: Abdomen is soft. There is no mass.     Tenderness: There is no abdominal tenderness. There is no guarding.  Musculoskeletal:  General: Normal range of motion.     Cervical back: Normal range of motion and neck supple.  Lymphadenopathy:     Cervical: No cervical adenopathy.  Skin:    General: Skin is warm and dry.     Findings: Rash present.     Comments: Macular papular rash legs and chest  Neurological:     Mental Status: She is alert.     Deep Tendon Reflexes: Reflexes are normal and symmetric.     Wt Readings from Last 3 Encounters:  01/01/20 138 lb (62.6 kg)  11/17/19 138 lb (62.6 kg)  10/09/19 126 lb (57.2 kg)    BP 118/72   Pulse 72   Temp 97.7 F (36.5 C) (Oral)   Ht 5\' 5"  (1.651 m)   Wt 138 lb (62.6 kg)   BMI 22.96 kg/m   Assessment and Plan:  1. Cystitis Acute.  Persistent.  Urinalysis confirms the patient has cystitis with 2+ leukocytes.  Will initiate cephalexin 500 mg 4 times a day for 3 days.  Patient is to return as needed. - cephALEXin (KEFLEX) 500 MG capsule; Take 1 capsule (500 mg total) by mouth 4 (four) times daily.  Dispense: 12 capsule; Refill: 0 - POCT urinalysis dipstick

## 2020-01-12 DIAGNOSIS — N1832 Chronic kidney disease, stage 3b: Secondary | ICD-10-CM | POA: Diagnosis not present

## 2020-01-12 DIAGNOSIS — E119 Type 2 diabetes mellitus without complications: Secondary | ICD-10-CM | POA: Diagnosis not present

## 2020-01-12 DIAGNOSIS — N2581 Secondary hyperparathyroidism of renal origin: Secondary | ICD-10-CM | POA: Diagnosis not present

## 2020-01-12 DIAGNOSIS — I10 Essential (primary) hypertension: Secondary | ICD-10-CM | POA: Diagnosis not present

## 2020-01-20 ENCOUNTER — Other Ambulatory Visit: Payer: Self-pay | Admitting: Family Medicine

## 2020-01-20 DIAGNOSIS — E119 Type 2 diabetes mellitus without complications: Secondary | ICD-10-CM

## 2020-02-14 ENCOUNTER — Other Ambulatory Visit: Payer: Self-pay

## 2020-02-14 ENCOUNTER — Ambulatory Visit (INDEPENDENT_AMBULATORY_CARE_PROVIDER_SITE_OTHER): Payer: PPO

## 2020-02-14 VITALS — BP 138/72 | HR 63 | Resp 16 | Ht 65.0 in | Wt 138.6 lb

## 2020-02-14 DIAGNOSIS — Z0000 Encounter for general adult medical examination without abnormal findings: Secondary | ICD-10-CM

## 2020-02-14 NOTE — Progress Notes (Signed)
Subjective:   Jordan Thornton is a 86 y.o. female who presents for Medicare Annual (Subsequent) preventive examination.  Review of Systems:   Cardiac Risk Factors include: advanced age (>45men, >49 women);diabetes mellitus;dyslipidemia;hypertension     Objective:     Vitals: BP 138/72 (BP Location: Left Arm, Patient Position: Sitting, Cuff Size: Normal)   Pulse 63   Resp 16   Ht 5\' 5"  (1.651 m)   Wt 138 lb 9.6 oz (62.9 kg)   SpO2 99%   BMI 23.06 kg/m   Body mass index is 23.06 kg/m.  Advanced Directives 02/14/2020 09/19/2019 09/19/2019 09/18/2019 09/16/2019 02/13/2019 02/09/2018  Does Patient Have a Medical Advance Directive? Yes Yes - Yes Yes Yes No  Type of Advance Directive [place];Living will Living will Living will Living will Healthcare Power of [place];Living will -  Does patient want to make changes to medical advance directive? - No - Patient declined - - - - -  Copy of [place] in Chart? Yes - validated most recent copy scanned in chart (See row information) - - - - Yes - validated most recent copy scanned in chart (See row information) -  Would patient like information on creating a medical advance directive? - - - - - - Yes (MAU/Ambulatory/Procedural Areas - Information given)    Tobacco Social History   Tobacco Use  Smoking Status Former Smoker  . Packs/day: 0.25  . Years: 25.00  . Pack years: 6.25  . Types: Cigarettes  . Quit date: 16  . Years since quitting: 39.4  Smokeless Tobacco Never Used  Tobacco Comment   smoking cessation materials not required     Counseling given: Not Answered Comment: smoking cessation materials not required   Clinical Intake:  Pre-visit preparation completed: Yes  Pain : No/denies pain     BMI - recorded: 23.06 Nutritional Status: BMI of 19-24  Normal Nutritional Risks: None Diabetes: Yes CBG done?: No Did pt. bring in CBG monitor from home?:  No   Nutrition Risk Assessment:  Has the patient had any N/V/D within the last 2 months?  No  Does the patient have any non-healing wounds?  No  Has the patient had any unintentional weight loss or weight gain?  No   Diabetes:  Is the patient diabetic?  Yes  If diabetic, was a CBG obtained today?  No  Did the patient bring in their glucometer from home?  No  How often do you monitor your CBG's? Daily fasting in am.   Financial Strains and Diabetes Management:  Are you having any financial strains with the device, your supplies or your medication? No .  Does the patient want to be seen by Chronic Care Management for management of their diabetes?  No  Would the patient like to be referred to a Nutritionist or for Diabetic Management?  No   Diabetic Exams:  Diabetic Eye Exam: Completed 09/05/19.   Diabetic Foot Exam: Completed 09/11/19.   How often do you need to have someone help you when you read instructions, pamphlets, or other written materials from your doctor or pharmacy?: 1 - Never  Interpreter Needed?: No  Information entered by :: Clemetine Marker LPN  Past Medical History:  Diagnosis Date  . Diabetes mellitus without complication ([place])   . GERD (gastroesophageal reflux disease)   . Hyperlipidemia   . Hypertension    Past Surgical History:  Procedure Laterality Date  . bladder tack    .  BREAST BIOPSY Left 10-15 yrs ago   benign  . CATARACT EXTRACTION Bilateral 05/2015  . CHOLECYSTECTOMY OPEN    . VAGINAL HYSTERECTOMY     Family History  Problem Relation Age of Onset  . Heart disease Maternal Aunt   . Heart disease Maternal Grandmother   . Stroke Father   . Breast cancer Neg Hx    Social History   Socioeconomic History  . Marital status: Married    Spouse name: Not on file  . Number of children: 2  . Years of education: Not on file  . Highest education level: Bachelor's degree (e.g., BA, AB, BS)  Occupational History    Employer: RETIRED  Tobacco Use   . Smoking status: Former Smoker    Packs/day: 0.25    Years: 25.00    Pack years: 6.25    Types: Cigarettes    Quit date: 1982    Years since quitting: 39.4  . Smokeless tobacco: Never Used  . Tobacco comment: smoking cessation materials not required  Substance and Sexual Activity  . Alcohol use: No    Alcohol/week: 0.0 standard drinks  . Drug use: No  . Sexual activity: Not Currently  Other Topics Concern  . Not on file  Social History Narrative  . Not on file   Social Determinants of Health   Financial Resource Strain: Low Risk   . Difficulty of Paying Living Expenses: Not hard at all  Food Insecurity: No Food Insecurity  . Worried About Charity fundraiser in the Last Year: Never true  . Ran Out of Food in the Last Year: Never true  Transportation Needs: No Transportation Needs  . Lack of Transportation (Medical): No  . Lack of Transportation (Non-Medical): No  Physical Activity: Inactive  . Days of Exercise per Week: 0 days  . Minutes of Exercise per Session: 0 min  Stress: No Stress Concern Present  . Feeling of Stress : Only a little  Social Connections: Slightly Isolated  . Frequency of Communication with Friends and Family: More than three times a week  . Frequency of Social Gatherings with Friends and Family: Three times a week  . Attends Religious Services: More than 4 times per year  . Active Member of Clubs or Organizations: No  . Attends Archivist Meetings: Never  . Marital Status: Married    Outpatient Encounter Medications as of 02/14/2020  Medication Sig  . Acetaminophen (TYLENOL ARTHRITIS PAIN PO) Take 1 tablet by mouth daily.  Marland Kitchen ADVOCATE LANCETS MISC use once daily as directed to test blood sugar  . Blood Glucose Monitoring Suppl (FREESTYLE LITE) DEVI use once daily as directed to test blood sugar  . Cholecalciferol (VITAMIN D) 2000 units CAPS Take 1 capsule by mouth daily. otc  . Elderberry 575 MG/5ML SYRP Take 1 Dose by mouth daily.   Marland Kitchen  FREESTYLE LITE test strip USE  STRIP TO CHECK GLUCOSE ONCE DAILY AS DIRECTED  . Lancet Devices (ADVOCATE LANCING DEVICE) MISC use once daily as directed to test blood sugar  . lansoprazole (PREVACID) 15 MG capsule Take 1 capsule (15 mg total) by mouth 1 day or 1 dose.  Marland Kitchen lisinopril-hydrochlorothiazide (ZESTORETIC) 20-12.5 MG tablet Take 1 tablet by mouth daily. Dr Holley Raring  . LORazepam (ATIVAN) 0.5 MG tablet Take 1 tablet (0.5 mg total) by mouth as needed.  . metFORMIN (GLUCOPHAGE) 500 MG tablet Take 1 tablet (500 mg total) by mouth 2 (two) times daily.  . simvastatin (ZOCOR) 20 MG tablet  Take 1 tablet (20 mg total) by mouth daily at 6 PM.  . sitaGLIPtin (JANUVIA) 100 MG tablet Take 1 tablet (100 mg total) by mouth daily.  . Omega-3 Fatty Acids (FISH OIL) 1000 MG CAPS Take 1 capsule (1,000 mg total) by mouth daily. (Patient not taking: Reported on 02/14/2020)  . [DISCONTINUED] cephALEXin (KEFLEX) 500 MG capsule Take 1 capsule (500 mg total) by mouth 4 (four) times daily.   No facility-administered encounter medications on file as of 02/14/2020.    Activities of Daily Living In your present state of health, do you have any difficulty performing the following activities: 02/14/2020 09/19/2019  Hearing? N Y  Comment declines hearing aids -  Vision? N N  Difficulty concentrating or making decisions? N N  Walking or climbing stairs? N N  Dressing or bathing? N N  Doing errands, shopping? N N  Preparing Food and eating ? N -  Using the Toilet? N -  In the past six months, have you accidently leaked urine? N -  Do you have problems with loss of bowel control? N -  Managing your Medications? N -  Managing your Finances? N -  Housekeeping or managing your Housekeeping? N -  Some recent data might be hidden    Patient Care Team: Juline Patch, MD as PCP - General (Family Medicine) Anthonette Legato, MD as Consulting Physician (Internal Medicine)    Assessment:   This is a routine wellness  examination for Jordan.  Exercise Activities and Dietary recommendations Current Exercise Habits: The patient does not participate in regular exercise at present, Exercise limited by: orthopedic condition(s)  Goals    . DIET - INCREASE WATER INTAKE     Recommend to drink at least 6-8 8oz glasses of water per day.       Fall Risk Fall Risk  02/14/2020 01/01/2020 11/17/2019 10/09/2019 09/11/2019  Falls in the past year? 1 0 0 0 1  Comment - - - - -  Number falls in past yr: 1 - - - 0  Injury with Fall? 0 - - - 0  Comment - - - - back hurts  Risk for fall due to : History of fall(s) - - - (No Data)  Risk for fall due to: Comment - - - - dark- couldn't see  Follow up Falls prevention discussed Falls evaluation completed Falls evaluation completed Falls evaluation completed Falls evaluation completed   [place]:  Any stairs in or around the home? Yes  If so, are there any without handrails? Yes   Home free of loose throw rugs in walkways, pet beds, electrical cords, etc? Yes  Adequate lighting in your home to reduce risk of falls? Yes   ASSISTIVE DEVICES UTILIZED TO PREVENT FALLS:  Life alert? No  Use of a cane, walker or w/c? No  Grab bars in the bathroom? No  Shower chair or bench in shower? No  Elevated toilet seat or a handicapped toilet? No   DME ORDERS:  DME order needed?  No   TIMED UP AND GO:  Was the test performed? Yes .  Length of time to ambulate 10 feet: 5 sec.   GAIT:  Appearance of gait: Gait steady and fast without the use of an assistive device.   Education: Fall risk prevention has been discussed.  Intervention(s) required? No    DME/home health order needed?  No    Depression Screen PHQ 2/9 Scores 02/14/2020 01/01/2020 11/17/2019 10/09/2019  PHQ - 2 Score 0 1 0 0  PHQ- 9 Score - 1 0 0     Cognitive Function     6CIT Screen 02/14/2020 02/13/2019 02/09/2018  What Year? 0 points 0 points 0 points  What month? 0 points  0 points 0 points  What time? 0 points 0 points 0 points  Count back from 20 0 points 0 points 0 points  Months in reverse 0 points 0 points 0 points  Repeat phrase 2 points 2 points 2 points  Total Score 2 2 2     Immunization History  Administered Date(s) Administered  . Fluad Quad(high Dose 65+) 05/04/2019  . Influenza, High Dose Seasonal PF 06/19/2017, 05/23/2018  . Influenza-Unspecified 06/19/2017, 05/23/2018  . PFIZER SARS-COV-2 Vaccination 11/10/2019, 12/06/2019  . Pneumococcal Conjugate-13 05/23/2015  . Pneumococcal Polysaccharide-23 09/14/2008  . Td 03/14/2006  . Tdap 05/23/2015  . Zoster Recombinat (Shingrix) 06/10/2018, 08/13/2018    Qualifies for Shingles Vaccine? Up to date  Tdap: Up to date  Flu Vaccine: Up to date  Pneumococcal Vaccine: Up to date  Covid-19 Vaccine: Up to date   Screening Tests Health Maintenance  Topic Date Due  . HEMOGLOBIN A1C  03/11/2020  . INFLUENZA VACCINE  04/14/2020  . OPHTHALMOLOGY EXAM  08/14/2020  . MAMMOGRAM  08/30/2020  . FOOT EXAM  09/10/2020  . TETANUS/TDAP  05/22/2025  . DEXA SCAN  Completed  . COVID-19 Vaccine  Completed  . PNA vac Low Risk Adult  Completed    Cancer Screenings:  Colorectal Screening: No longer required.   Mammogram: Completed 08/31/19. Repeat every year;    Bone Density: Completed 10/15/08. Results reflect  OSTEOPENIAS. No longer required.   Lung Cancer Screening: (Low Dose CT Chest recommended if Age 28-80 years, 30 pack-year currently smoking OR have quit w/in 15years.) does not qualify.   Additional Screening:  Hepatitis C Screening: no longer required  Vision Screening: Recommended annual ophthalmology exams for early detection of glaucoma and other disorders of the eye. Is the patient up to date with their annual eye exam?  Yes  Who is the provider or what is the name of the office in which the pt attends annual eye exams? [facility]  Dental Screening: Recommended annual  dental exams for proper oral hygiene  Community Resource Referral:  CRR required this visit?  No      Plan:     I have personally reviewed and addressed the Medicare Annual Wellness questionnaire and have noted the following in the patient's chart:  A. Medical and social history B. Use of alcohol, tobacco or illicit drugs  C. Current medications and supplements D. Functional ability and status E.  Nutritional status F.  Physical activity G. Advance directives H. List of other physicians I.  Hospitalizations, surgeries, and ER visits in previous 12 months J.  [place] such as hearing and vision if needed, cognitive and depression L. Referrals and appointments   In addition, I have reviewed and discussed with patient certain preventive protocols, quality metrics, and best practice recommendations. A written personalized care plan for preventive services as well as general preventive health recommendations were provided to patient.   Signed,  Clemetine Marker, LPN Nurse Health Advisor   Nurse Notes: none

## 2020-02-14 NOTE — Patient Instructions (Signed)
Jordan Thornton , Thank you for taking time to come for your Medicare Wellness Visit. I appreciate your ongoing commitment to your health goals. Please review the following plan we discussed and let me know if I can assist you in the future.   Screening recommendations/referrals: Colonoscopy: no longer required Mammogram: done 08/31/19 Bone Density: done 2010 Recommended yearly ophthalmology/optometry visit for glaucoma screening and checkup Recommended yearly dental visit for hygiene and checkup  Vaccinations: Influenza vaccine: done 05/04/19 Pneumococcal vaccine: done 05/23/15 Tdap vaccine: done 05/23/15 Shingles vaccine: done 06/10/18 & 08/13/18   Covid-19: done 11/10/19 & 12/06/19  Conditions/risks identified: Keep up the great work!  Next appointment: Follow up in one year for your annual wellness visit    Preventive Care 65 Years and Older, Female Preventive care refers to lifestyle choices and visits with your health care provider that can promote health and wellness. What does preventive care include?  A yearly physical exam. This is also called an annual well check.  Dental exams once or twice a year.  Routine eye exams. Ask your health care provider how often you should have your eyes checked.  Personal lifestyle choices, including:  Daily care of your teeth and gums.  Regular physical activity.  Eating a healthy diet.  Avoiding tobacco and drug use.  Limiting alcohol use.  Practicing safe sex.  Taking low-dose aspirin every day.  Taking vitamin and mineral supplements as recommended by your health care provider. What happens during an annual well check? The services and screenings done by your health care provider during your annual well check will depend on your age, overall health, lifestyle risk factors, and family history of disease. Counseling  Your health care provider may ask you questions about your:  Alcohol use.  Tobacco use.  Drug use.  Emotional  well-being.  Home and relationship well-being.  Sexual activity.  Eating habits.  History of falls.  Memory and ability to understand (cognition).  Work and work Statistician.  Reproductive health. Screening  You may have the following tests or measurements:  Height, weight, and BMI.  Blood pressure.  Lipid and cholesterol levels. These may be checked every 5 years, or more frequently if you are over 84 years old.  Skin check.  Lung cancer screening. You may have this screening every year starting at age 84 if you have a 30-pack-year history of smoking and currently smoke or have quit within the past 15 years.  Fecal occult blood test (FOBT) of the stool. You may have this test every year starting at age 84.  Flexible sigmoidoscopy or colonoscopy. You may have a sigmoidoscopy every 5 years or a colonoscopy every 10 years starting at age 27.  Hepatitis C blood test.  Hepatitis B blood test.  Sexually transmitted disease (STD) testing.  Diabetes screening. This is done by checking your blood sugar (glucose) after you have not eaten for a while (fasting). You may have this done every 1-3 years.  Bone density scan. This is done to screen for osteoporosis. You may have this done starting at age 84.  Mammogram. This may be done every 1-2 years. Talk to your health care provider about how often you should have regular mammograms. Talk with your health care provider about your test results, treatment options, and if necessary, the need for more tests. Vaccines  Your health care provider may recommend certain vaccines, such as:  Influenza vaccine. This is recommended every year.  Tetanus, diphtheria, and acellular pertussis (Tdap, Td) vaccine. You  may need a Td booster every 10 years.  Zoster vaccine. You may need this after age 84.  Pneumococcal 13-valent conjugate (PCV13) vaccine. One dose is recommended after age 84.  Pneumococcal polysaccharide (PPSV23) vaccine. One  dose is recommended after age 84. Talk to your health care provider about which screenings and vaccines you need and how often you need them. This information is not intended to replace advice given to you by your health care provider. Make sure you discuss any questions you have with your health care provider. Document Released: 09/27/2015 Document Revised: 05/20/2016 Document Reviewed: 07/02/2015 Elsevier Interactive Patient Education  2017 [place] Prevention in the Home Falls can cause injuries. They can happen to people of all ages. There are many things you can do to make your home safe and to help prevent falls. What can I do on the outside of my home?  Regularly fix the edges of walkways and driveways and fix any cracks.  Remove anything that might make you trip as you walk through a door, such as a raised step or threshold.  Trim any bushes or trees on the path to your home.  Use bright outdoor lighting.  Clear any walking paths of anything that might make someone trip, such as rocks or tools.  Regularly check to see if handrails are loose or broken. Make sure that both sides of any steps have handrails.  Any raised decks and porches should have guardrails on the edges.  Have any leaves, snow, or ice cleared regularly.  Use sand or salt on walking paths during winter.  Clean up any spills in your garage right away. This includes oil or grease spills. What can I do in the bathroom?  Use night lights.  Install grab bars by the toilet and in the tub and shower. Do not use towel bars as grab bars.  Use non-skid mats or decals in the tub or shower.  If you need to sit down in the shower, use a plastic, non-slip stool.  Keep the floor dry. Clean up any water that spills on the floor as soon as it happens.  Remove soap buildup in the tub or shower regularly.  Attach bath mats securely with double-sided non-slip rug tape.  Do not have throw rugs and other  things on the floor that can make you trip. What can I do in the bedroom?  Use night lights.  Make sure that you have a light by your bed that is easy to reach.  Do not use any sheets or blankets that are too big for your bed. They should not hang down onto the floor.  Have a firm chair that has side arms. You can use this for support while you get dressed.  Do not have throw rugs and other things on the floor that can make you trip. What can I do in the kitchen?  Clean up any spills right away.  Avoid walking on wet floors.  Keep items that you use a lot in easy-to-reach places.  If you need to reach something above you, use a strong step stool that has a grab bar.  Keep electrical cords out of the way.  Do not use floor polish or wax that makes floors slippery. If you must use wax, use non-skid floor wax.  Do not have throw rugs and other things on the floor that can make you trip. What can I do with my stairs?  Do not leave any items  on the stairs.  Make sure that there are handrails on both sides of the stairs and use them. Fix handrails that are broken or loose. Make sure that handrails are as long as the stairways.  Check any carpeting to make sure that it is firmly attached to the stairs. Fix any carpet that is loose or worn.  Avoid having throw rugs at the top or bottom of the stairs. If you do have throw rugs, attach them to the floor with carpet tape.  Make sure that you have a light switch at the top of the stairs and the bottom of the stairs. If you do not have them, ask someone to add them for you. What else can I do to help prevent falls?  Wear shoes that:  Do not have high heels.  Have rubber bottoms.  Are comfortable and fit you well.  Are closed at the toe. Do not wear sandals.  If you use a stepladder:  Make sure that it is fully opened. Do not climb a closed stepladder.  Make sure that both sides of the stepladder are locked into place.  Ask  someone to hold it for you, if possible.  Clearly mark and make sure that you can see:  Any grab bars or handrails.  First and last steps.  Where the edge of each step is.  Use tools that help you move around (mobility aids) if they are needed. These include:  Canes.  Walkers.  Scooters.  Crutches.  Turn on the lights when you go into a dark area. Replace any light bulbs as soon as they burn out.  Set up your furniture so you have a clear path. Avoid moving your furniture around.  If any of your floors are uneven, fix them.  If there are any pets around you, be aware of where they are.  Review your medicines with your doctor. Some medicines can make you feel dizzy. This can increase your chance of falling. Ask your doctor what other things that you can do to help prevent falls. This information is not intended to replace advice given to you by your health care provider. Make sure you discuss any questions you have with your health care provider. Document Released: 06/27/2009 Document Revised: 02/06/2016 Document Reviewed: 10/05/2014 Elsevier Interactive Patient Education  2017 [employer].

## 2020-02-23 ENCOUNTER — Ambulatory Visit (INDEPENDENT_AMBULATORY_CARE_PROVIDER_SITE_OTHER): Payer: PPO | Admitting: Family Medicine

## 2020-02-23 ENCOUNTER — Encounter: Payer: Self-pay | Admitting: Family Medicine

## 2020-02-23 ENCOUNTER — Other Ambulatory Visit: Payer: Self-pay

## 2020-02-23 DIAGNOSIS — K219 Gastro-esophageal reflux disease without esophagitis: Secondary | ICD-10-CM

## 2020-02-23 DIAGNOSIS — F419 Anxiety disorder, unspecified: Secondary | ICD-10-CM | POA: Diagnosis not present

## 2020-02-23 DIAGNOSIS — E119 Type 2 diabetes mellitus without complications: Secondary | ICD-10-CM

## 2020-02-23 DIAGNOSIS — E782 Mixed hyperlipidemia: Secondary | ICD-10-CM | POA: Diagnosis not present

## 2020-02-23 MED ORDER — LANSOPRAZOLE 15 MG PO CPDR: 15.0000 mg | DELAYED_RELEASE_CAPSULE | ORAL | 1 refills | Status: DC

## 2020-02-23 MED ORDER — SERTRALINE HCL 25 MG PO TABS: 25.0000 mg | ORAL_TABLET | Freq: Every day | ORAL | 2 refills | Status: DC

## 2020-02-23 MED ORDER — SIMVASTATIN 20 MG PO TABS: 20.0000 mg | ORAL_TABLET | Freq: Every day | ORAL | 1 refills | Status: DC

## 2020-02-23 MED ORDER — LORAZEPAM 0.5 MG PO TABS: 0.5000 mg | ORAL_TABLET | ORAL | 5 refills | Status: DC | PRN

## 2020-02-23 MED ORDER — METFORMIN HCL 500 MG PO TABS: 500.0000 mg | ORAL_TABLET | Freq: Two times a day (BID) | ORAL | 1 refills | Status: DC

## 2020-02-23 MED ORDER — SITAGLIPTIN PHOSPHATE 100 MG PO TABS: 100.0000 mg | ORAL_TABLET | Freq: Every day | ORAL | 1 refills | Status: DC

## 2020-02-23 NOTE — Progress Notes (Signed)
Date:  02/23/2020   Name:  Jordan Thornton   DOB:  Feb 17, 1930   MRN:  [id number]   Chief Complaint: Gastroesophageal Reflux, Hyperlipidemia, Hypertension, Diabetes, and Anxiety  Gastroesophageal Reflux She reports no abdominal pain, no belching, no chest pain, no choking, no coughing, no dysphagia, no early satiety, no globus sensation, no heartburn, no hoarse voice, no nausea, no sore throat, no stridor, no tooth decay, no water brash or no wheezing. This is a new problem. The current episode started more than 1 year ago. The problem has been gradually improving. The symptoms are aggravated by certain foods. Pertinent negatives include no anemia, fatigue, melena, muscle weakness or orthopnea. There are no known risk factors. She has tried a PPI for the symptoms. The treatment provided moderate relief. Past procedures do not include an abdominal ultrasound, an EGD, esophageal manometry, esophageal pH monitoring, H. pylori antibody titer or a UGI.  Hyperlipidemia This is a chronic problem. The current episode started more than 1 year ago. The problem is controlled. Recent lipid tests were reviewed and are normal. She has no history of chronic renal disease, diabetes, hypothyroidism, liver disease, obesity or nephrotic syndrome. Pertinent negatives include no chest pain, focal sensory loss, focal weakness, leg pain, myalgias or shortness of breath. Current antihyperlipidemic treatment includes statins. The current treatment provides moderate improvement of lipids. There are no compliance problems.   Hypertension This is a chronic problem. The current episode started more than 1 year ago. The problem has been waxing and waning since onset. The problem is controlled. Associated symptoms include anxiety. Pertinent negatives include no blurred vision, chest pain, headaches, malaise/fatigue, neck pain, orthopnea, palpitations, peripheral edema, PND, shortness of breath or sweats. Risk factors for  coronary artery disease include diabetes mellitus and dyslipidemia. Past treatments include ACE inhibitors and diuretics. The current treatment provides mild improvement. There are no compliance problems.  There is no history of angina, kidney disease, CAD/MI, CVA, heart failure, left ventricular hypertrophy, PVD or retinopathy. There is no history of chronic renal disease, a hypertension causing med or renovascular disease.  Diabetes She presents for her follow-up diabetic visit. She has type 2 diabetes mellitus. Her disease course has been stable. Hypoglycemia symptoms include nervousness/anxiousness. Pertinent negatives for hypoglycemia include no confusion, dizziness, headaches or sweats. Pertinent negatives for diabetes include no blurred vision, no chest pain, no fatigue, no polydipsia, no polyphagia, no polyuria and no weakness. Symptoms are stable. Pertinent negatives for diabetic complications include no autonomic neuropathy, CVA, heart disease, impotence, nephropathy, peripheral neuropathy, PVD or retinopathy. Risk factors for coronary artery disease include hypertension, dyslipidemia and diabetes mellitus. Current diabetic treatment includes oral agent (dual therapy). She is compliant with treatment all of the time. An ACE inhibitor/angiotensin II receptor blocker is being taken. Eye exam is current.  Anxiety Presents for follow-up visit. Symptoms include excessive worry and nervous/anxious behavior. Patient reports no chest pain, confusion, dizziness, impotence, irritability, nausea, palpitations, panic or shortness of breath. The severity of symptoms is mild.      Lab Results  Component Value Date   CREATININE 1.66 (H) 10/17/2019   BUN 19 10/17/2019   NA 147 (H) 10/17/2019   K 4.9 10/17/2019   CL 108 (H) 10/17/2019   CO2 21 10/17/2019   Lab Results  Component Value Date   CHOL 193 11/17/2019   HDL 55 11/17/2019   LDLCALC 117 (H) 11/17/2019   TRIG 116 11/17/2019   CHOLHDL 2.8  03/02/2018   No results found for:  TSH Lab Results  Component Value Date   HGBA1C 6.4 (H) 09/11/2019   Lab Results  Component Value Date   WBC 5.6 10/17/2019   HGB 9.9 (L) 10/17/2019   HCT 30.0 (L) 10/17/2019   MCV 79 10/17/2019   PLT 200 10/17/2019   Lab Results  Component Value Date   ALT 19 09/21/2019   AST 30 09/21/2019   ALKPHOS 35 (L) 09/21/2019   BILITOT 0.4 09/21/2019     Review of Systems  Constitutional: Negative.  Negative for chills, fatigue, fever, irritability, malaise/fatigue and unexpected weight change.  HENT: Negative for congestion, ear discharge, ear pain, hoarse voice, postnasal drip, rhinorrhea, sinus pressure, sinus pain, sneezing and sore throat.   Eyes: Negative for blurred vision, photophobia, pain, discharge, redness and itching.  Respiratory: Negative for cough, choking, shortness of breath, wheezing and stridor.   Cardiovascular: Negative for chest pain, palpitations, orthopnea, leg swelling and PND.  Gastrointestinal: Negative for abdominal pain, blood in stool, constipation, diarrhea, dysphagia, heartburn, melena, nausea and vomiting.  Endocrine: Negative for cold intolerance, heat intolerance, polydipsia, polyphagia and polyuria.  Genitourinary: Negative for dysuria, flank pain, frequency, hematuria, impotence, menstrual problem, pelvic pain, urgency, vaginal bleeding and vaginal discharge.  Musculoskeletal: Negative for arthralgias, back pain, myalgias, muscle weakness and neck pain.  Skin: Negative for rash.  Allergic/Immunologic: Negative for environmental allergies and food allergies.  Neurological: Negative for dizziness, focal weakness, weakness, light-headedness, numbness and headaches.  Hematological: Negative for adenopathy. Does not bruise/bleed easily.  Psychiatric/Behavioral: Negative for confusion and dysphoric mood. The patient is nervous/anxious.     Patient Active Problem List   Diagnosis Date Noted  . Acute hypoxemic  respiratory failure due to COVID-19 ([place]) 09/19/2019  . Stage 3 chronic kidney disease 09/02/2019  . Diabetes mellitus with no complication ([place]) 83/38/2505  . Familial multiple lipoprotein-type hyperlipidemia 01/03/2015  . Creatinine elevation 01/03/2015  . Diabetes ([place]) 12/15/2014  . Hyperlipidemia 12/15/2014  . Hypertension 12/15/2014  . Gastro-esophageal reflux disease without esophagitis 12/15/2014    No Known Allergies  Past Surgical History:  Procedure Laterality Date  . bladder tack    . BREAST BIOPSY Left 10-15 yrs ago   benign  . CATARACT EXTRACTION Bilateral 05/2015  . CHOLECYSTECTOMY OPEN    . VAGINAL HYSTERECTOMY      Social History   Tobacco Use  . Smoking status: Former Smoker    Packs/day: 0.25    Years: 25.00    Pack years: 6.25    Types: Cigarettes    Quit date: 1982    Years since quitting: 39.4  . Smokeless tobacco: Never Used  . Tobacco comment: smoking cessation materials not required  Vaping Use  . Vaping Use: Never used  Substance Use Topics  . Alcohol use: No    Alcohol/week: 0.0 standard drinks  . Drug use: No     Medication list has been reviewed and updated.  Current Meds  Medication Sig  . Acetaminophen (TYLENOL ARTHRITIS PAIN PO) Take 1 tablet by mouth daily.  Marland Kitchen ADVOCATE LANCETS MISC use once daily as directed to test blood sugar  . Blood Glucose Monitoring Suppl (FREESTYLE LITE) DEVI use once daily as directed to test blood sugar  . Cholecalciferol (VITAMIN D) 2000 units CAPS Take 1 capsule by mouth daily. otc  . Elderberry 575 MG/5ML SYRP Take 1 Dose by mouth daily.   Marland Kitchen FREESTYLE LITE test strip USE  STRIP TO CHECK GLUCOSE ONCE DAILY AS DIRECTED  . Lancet Devices (ADVOCATE LANCING DEVICE) MISC  use once daily as directed to test blood sugar  . lansoprazole (PREVACID) 15 MG capsule Take 1 capsule (15 mg total) by mouth 1 day or 1 dose.  Marland Kitchen lisinopril-hydrochlorothiazide (ZESTORETIC) 20-12.5 MG tablet Take 1 tablet by mouth daily.  Dr Holley Raring  . LORazepam (ATIVAN) 0.5 MG tablet Take 1 tablet (0.5 mg total) by mouth as needed.  . metFORMIN (GLUCOPHAGE) 500 MG tablet Take 1 tablet (500 mg total) by mouth 2 (two) times daily.  . Omega-3 Fatty Acids (FISH OIL) 1000 MG CAPS Take 1 capsule (1,000 mg total) by mouth daily.  . simvastatin (ZOCOR) 20 MG tablet Take 1 tablet (20 mg total) by mouth daily at 6 PM.  . sitaGLIPtin (JANUVIA) 100 MG tablet Take 1 tablet (100 mg total) by mouth daily.    PHQ 2/9 Scores 02/23/2020 02/14/2020 01/01/2020 11/17/2019  PHQ - 2 Score 0 0 1 0  PHQ- 9 Score 0 - 1 0    BP Readings from Last 3 Encounters:  02/23/20 130/80  02/14/20 138/72  01/01/20 118/72    Physical Exam Vitals and nursing note reviewed.  Constitutional:      Appearance: She is well-developed.  HENT:     Head: Normocephalic.     Right Ear: Tympanic membrane, ear canal and external ear normal.     Left Ear: Tympanic membrane, ear canal and external ear normal.  Eyes:     General: Lids are everted, no foreign bodies appreciated. No scleral icterus.       Left eye: No foreign body or hordeolum.     Conjunctiva/sclera: Conjunctivae normal.     Right eye: Right conjunctiva is not injected.     Left eye: Left conjunctiva is not injected.     Pupils: Pupils are equal, round, and reactive to light.  Neck:     Thyroid: No thyromegaly.     Vascular: No carotid bruit or JVD.     Trachea: No tracheal deviation.  Cardiovascular:     Rate and Rhythm: Normal rate and regular rhythm.     Heart sounds: Normal heart sounds. No murmur heard.  No friction rub. No gallop.   Pulmonary:     Effort: Pulmonary effort is normal. No respiratory distress.     Breath sounds: Normal breath sounds. No stridor. No wheezing, rhonchi or rales.  Chest:     Chest wall: No tenderness.  Abdominal:     General: Bowel sounds are normal.     Palpations: Abdomen is soft. There is no mass.     Tenderness: There is no abdominal tenderness. There is no  guarding or rebound.  Musculoskeletal:        General: No tenderness. Normal range of motion.     Cervical back: Normal range of motion and neck supple.  Lymphadenopathy:     Cervical: No cervical adenopathy.  Skin:    General: Skin is warm.     Findings: No rash.  Neurological:     Mental Status: She is alert and oriented to person, place, and time.     Cranial Nerves: No cranial nerve deficit.     Deep Tendon Reflexes: Reflexes normal.  Psychiatric:        Mood and Affect: Mood is not anxious or depressed.     Wt Readings from Last 3 Encounters:  02/23/20 138 lb (62.6 kg)  02/14/20 138 lb 9.6 oz (62.9 kg)  01/01/20 138 lb (62.6 kg)    BP 130/80   Pulse 60  Ht 5\' 5"  (1.651 m)   Wt 138 lb (62.6 kg)   BMI 22.96 kg/m   Assessment and Plan:  1. Gastro-esophageal reflux disease without esophagitis Chronic.  Controlled.  Stable.  Continue lansoprazole 15 mg daily. - lansoprazole (PREVACID) 15 MG capsule; Take 1 capsule (15 mg total) by mouth 1 day or 1 dose.  Dispense: 90 capsule; Refill: 1  2. Anxiety Chronic.  Relatively controlled.  Currently stable.  We would like to decrease the Ativan usage and I have suggested and will begin sertraline 25 mg on a daily basis.  At this point time we will decrease her Ativan to 20 per 30-day.  And patient would use only on an as-needed basis.  We will recheck patient in 6weeks to 8 weeks for evaluation of effects. - LORazepam (ATIVAN) 0.5 MG tablet; Take 1 tablet (0.5 mg total) by mouth as needed.  Dispense: 20 tablet; Refill: 5 - sertraline (ZOLOFT) 25 MG tablet; Take 1 tablet (25 mg total) by mouth daily.  Dispense: 30 tablet; Refill: 2  3. Diabetes mellitus with no complication (HCC) Chronic.  Controlled.  Stable.  Continue Metformin 500 mg 1 twice a day and Januvia 100 mg once a day.  Will check A1c for status. - metFORMIN (GLUCOPHAGE) 500 MG tablet; Take 1 tablet (500 mg total) by mouth 2 (two) times daily.  Dispense: 180 tablet;  Refill: 1 - sitaGLIPtin (JANUVIA) 100 MG tablet; Take 1 tablet (100 mg total) by mouth daily.  Dispense: 90 tablet; Refill: 1 - HgB A1c  4. Mixed hyperlipidemia Chronic.  Controlled.  Stable.  Continue simvastatin 20 mg once a day. - simvastatin (ZOCOR) 20 MG tablet; Take 1 tablet (20 mg total) by mouth daily at 6 PM.  Dispense: 90 tablet; Refill: 1

## 2020-02-24 LAB — HEMOGLOBIN A1C
Est. average glucose Bld gHb Est-mCnc: 143 mg/dL
Hgb A1c MFr Bld: 6.6 % — ABNORMAL HIGH (ref 4.8–5.6)

## 2020-03-01 ENCOUNTER — Ambulatory Visit: Payer: PPO | Admitting: Family Medicine

## 2020-04-23 IMAGING — MG DIGITAL SCREENING BILAT W/ TOMO W/ CAD
8 series · 8 of 24 positions shown · non-contrast
Comparison: Previous exam(s).

CLINICAL DATA: Screening.

EXAM:
DIGITAL SCREENING BILATERAL MAMMOGRAM WITH TOMO AND CAD

[L MLO synth-2D]
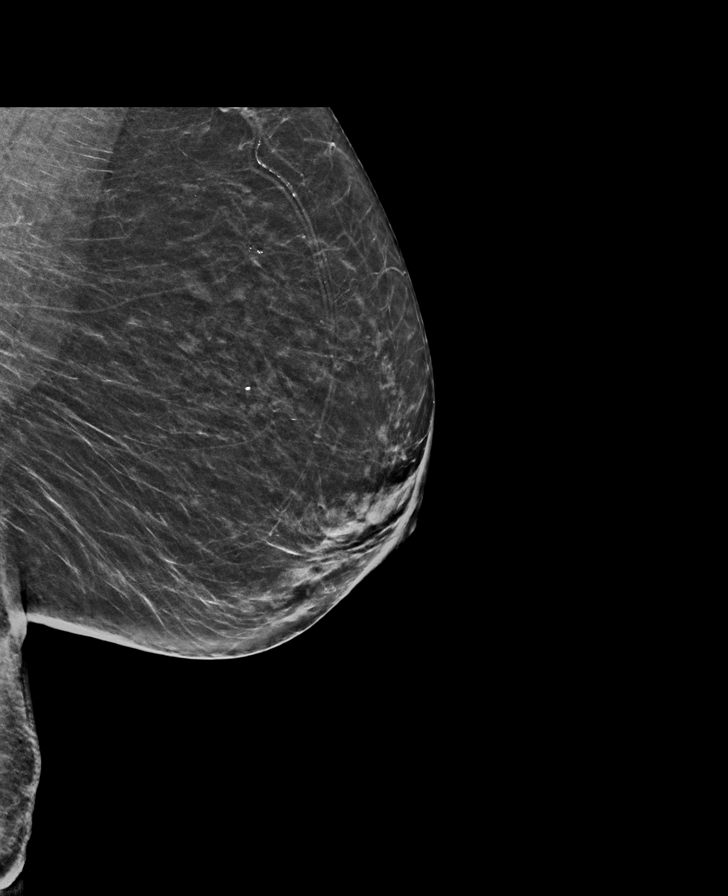

[R MLO synth-2D]
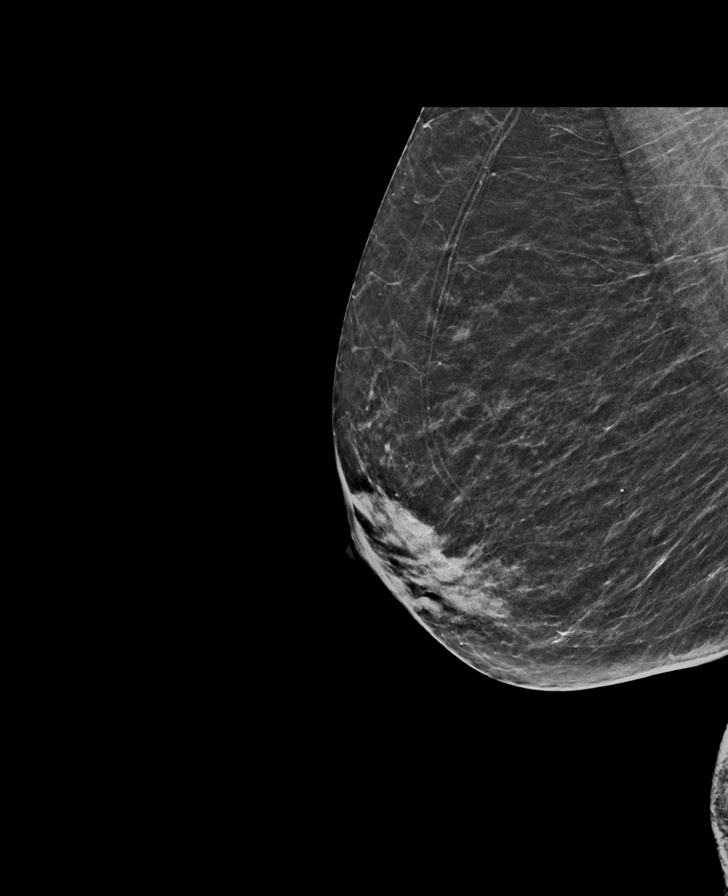

[L CC synth-2D]
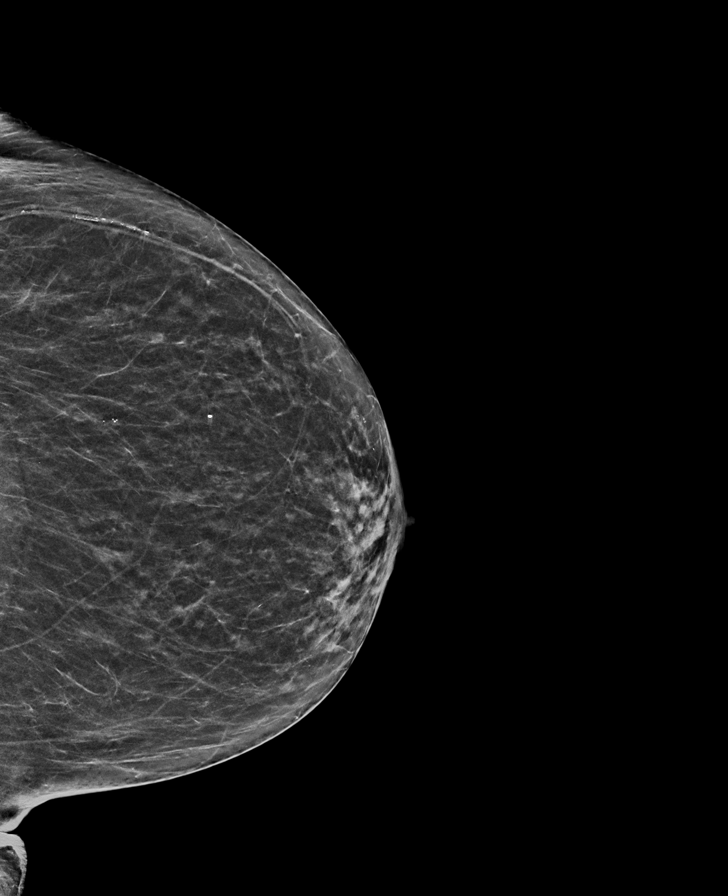

[R CC synth-2D]
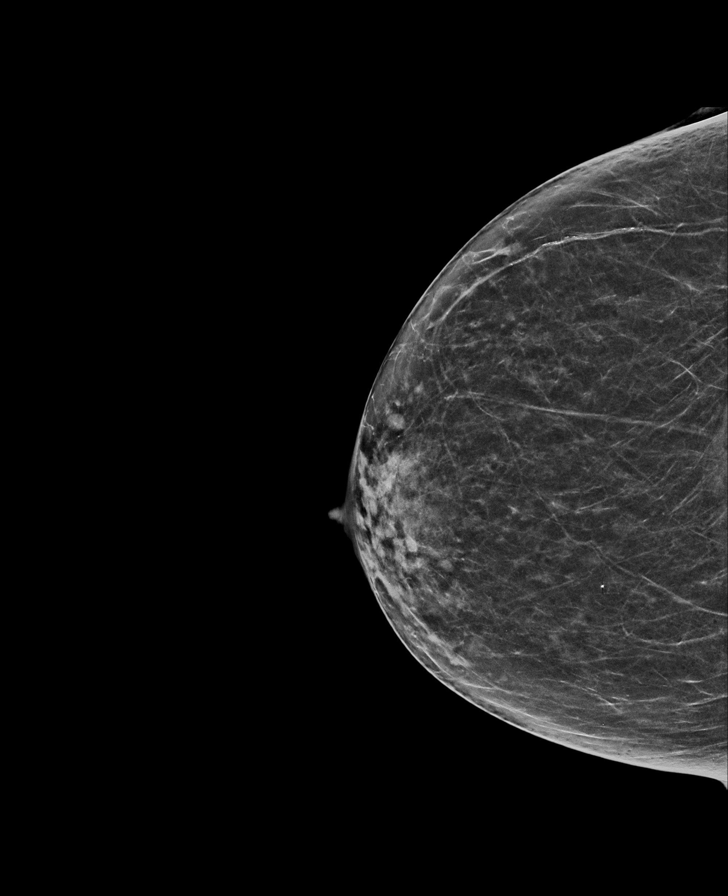

[R CC tomo · tomo slice 29/58.0]
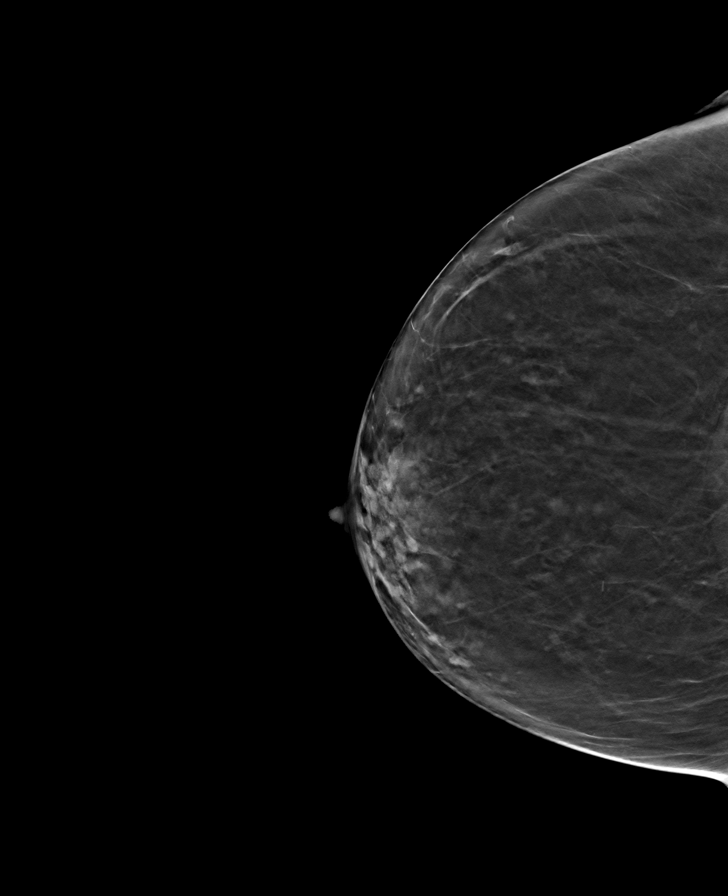

[L MLO tomo · tomo slice 29/57.0]
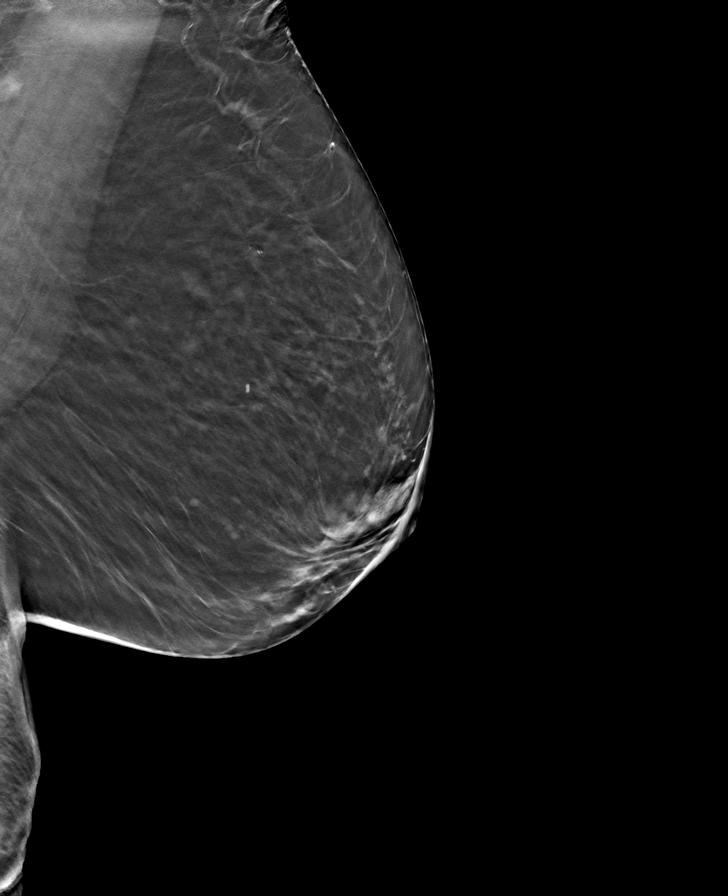

[R MLO tomo · tomo slice 28/55.0]
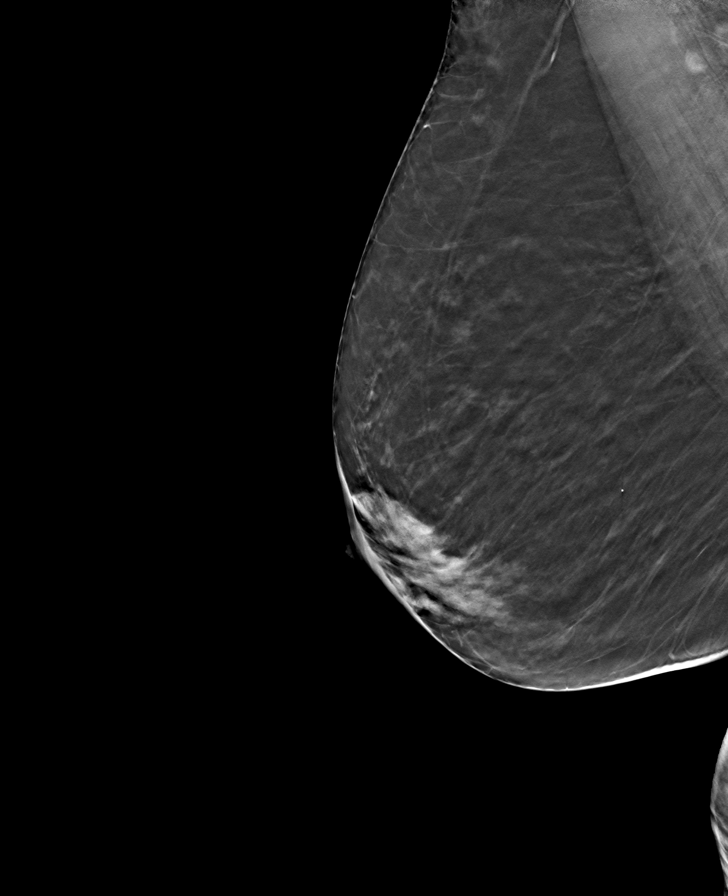

[L CC tomo · tomo slice 27/53.0]
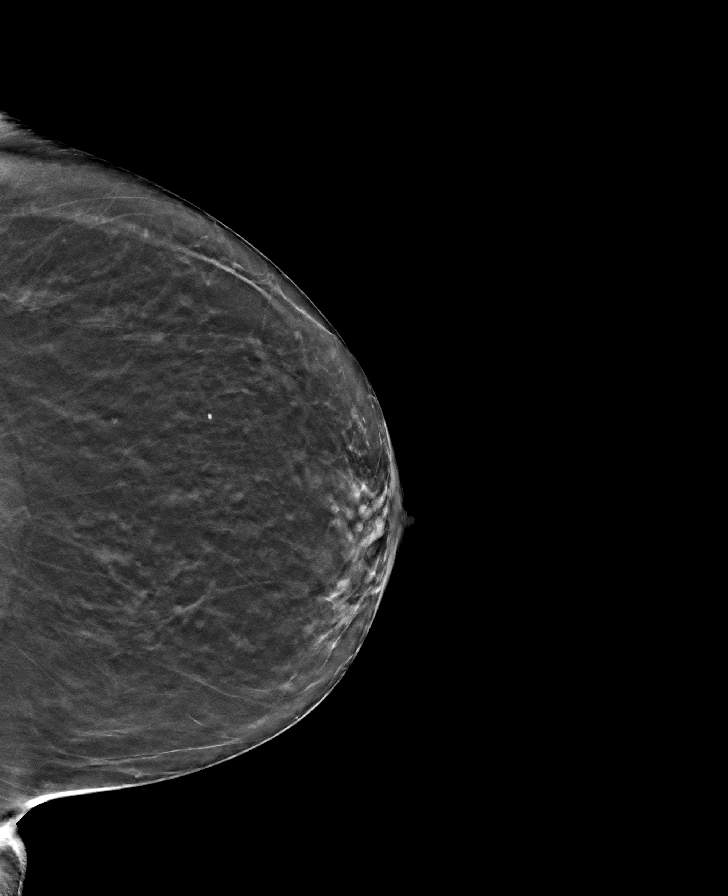

[8 of 24 positions shown; findings below may reference images not displayed]

ACR Breast Density Category b: There are scattered areas of
fibroglandular density.
FINDINGS: There are no findings suspicious for malignancy. Images were
processed with CAD.
IMPRESSION: No mammographic evidence of malignancy. A result letter of this
screening mammogram will be mailed directly to the patient.

RECOMMENDATION:
Screening mammogram in one year. (Code:[id number])

BI-RADS CATEGORY  1: Negative.

## 2020-05-17 DIAGNOSIS — D631 Anemia in chronic kidney disease: Secondary | ICD-10-CM | POA: Diagnosis not present

## 2020-05-17 DIAGNOSIS — E119 Type 2 diabetes mellitus without complications: Secondary | ICD-10-CM | POA: Diagnosis not present

## 2020-05-17 DIAGNOSIS — N2581 Secondary hyperparathyroidism of renal origin: Secondary | ICD-10-CM | POA: Diagnosis not present

## 2020-05-17 DIAGNOSIS — I10 Essential (primary) hypertension: Secondary | ICD-10-CM | POA: Diagnosis not present

## 2020-05-17 DIAGNOSIS — R809 Proteinuria, unspecified: Secondary | ICD-10-CM | POA: Diagnosis not present

## 2020-05-17 DIAGNOSIS — N1832 Chronic kidney disease, stage 3b: Secondary | ICD-10-CM | POA: Diagnosis not present

## 2020-05-22 DIAGNOSIS — N1832 Chronic kidney disease, stage 3b: Secondary | ICD-10-CM | POA: Diagnosis not present

## 2020-05-24 ENCOUNTER — Telehealth: Payer: Self-pay

## 2020-05-24 NOTE — Chronic Care Management (AMB) (Signed)
  Chronic Care Management   Outreach Note  05/24/2020 Name: Jordan Thornton MRN: [id number] DOB: October 30, 1929  Jordan Thornton is a 86 y.o. year old female who is a primary care patient of Juline Patch, MD. I reached out to Jordan Thornton by phone today in response to a referral sent by [name]'s health plan.     An unsuccessful telephone outreach was attempted today. The patient was referred to the case management team for assistance with care management and care coordination.   Follow Up Plan: The care management team will reach out to the patient again over the next 7 days.  If patient returns call to provider office, please advise to call [facility]  at [place], [place], [place], [place] [address] Direct Dial: [phone] [email or web address]: [email or web address]

## 2020-05-29 ENCOUNTER — Other Ambulatory Visit: Payer: Self-pay | Admitting: Family Medicine

## 2020-05-29 DIAGNOSIS — E119 Type 2 diabetes mellitus without complications: Secondary | ICD-10-CM

## 2020-05-31 DIAGNOSIS — N1832 Chronic kidney disease, stage 3b: Secondary | ICD-10-CM | POA: Diagnosis not present

## 2020-05-31 NOTE — Chronic Care Management (AMB) (Signed)
  Chronic Care Management   Note  05/31/2020 Name: Jordan Thornton MRN: [id number] DOB: 02/01/1930  Jordan Thornton is a 86 y.o. year old female who is a primary care patient of Juline Patch, MD. I reached out to Jordan Thornton by phone today in response to a referral sent by [name]'s health plan.     [name] was given information about Chronic Care Management services today including:  1. CCM service includes personalized support from designated clinical staff supervised by her physician, including individualized plan of care and coordination with other care providers 2. 24/7 contact phone numbers for assistance for urgent and routine care needs. 3. Service will only be billed when office clinical staff spend 20 minutes or more in a month to coordinate care. 4. Only one practitioner may furnish and bill the service in a calendar month. 5. The patient may stop CCM services at any time (effective at the end of the month) by phone call to the office staff. 6. The patient will be responsible for cost sharing (co-pay) of up to 20% of the service fee (after annual deductible is met).  Patient agreed to services and verbal consent obtained.   Follow up plan: Telephone appointment with care management team member scheduled TXH:[id number] D  06/06/2020  Noreene Larsson, [place], [place], [place] [address] Direct Dial: [phone] [name]: [email or web address]

## 2020-06-06 ENCOUNTER — Telehealth: Payer: PPO

## 2020-06-11 ENCOUNTER — Ambulatory Visit: Payer: PPO | Admitting: Pharmacist

## 2020-06-11 ENCOUNTER — Other Ambulatory Visit: Payer: Self-pay

## 2020-06-11 DIAGNOSIS — F419 Anxiety disorder, unspecified: Secondary | ICD-10-CM

## 2020-06-11 DIAGNOSIS — E119 Type 2 diabetes mellitus without complications: Secondary | ICD-10-CM

## 2020-06-11 MED ORDER — FREESTYLE LITE TEST VI STRP: ORAL_STRIP | 0 refills | Status: DC

## 2020-06-11 NOTE — Chronic Care Management (AMB) (Deleted)
Chronic Care Management Pharmacy  Name: Jordan Thornton  MRN: [id number] DOB: 1930-08-10   Chief Complaint/ HPI  Jordan Thornton,  86 y.o. , female presents for their Initial CCM visit with the clinical pharmacist via telephone due to COVID-19 Pandemic.  PCP : Jordan Patch, MD Patient Care Team: Jordan Patch, MD as PCP - General (Family Medicine) Jordan Legato, MD as Consulting Physician (Internal Medicine) Jordan Thornton, [facility] (Pharmacist)  Their chronic conditions include: Hypertension, Hyperlipidemia, Diabetes, GERD and Chronic Kidney Disease   Office Visits: 02/23/20-Jordan Thornton - Sertraline 78m qd, decrease lorazepam #20/30day. A1c   Consult Visit:05/17/20 - Jordan Thornton Nephrology - eGFR decreased to 223mmin, repeat lab Scr 1.92 eGFr 26  No Known Allergies  Medications: Outpatient Encounter Medications as of 06/11/2020  Medication Sig  . Acetaminophen (TYLENOL ARTHRITIS PAIN PO) Take 1 tablet by mouth daily.  . Marland KitchenDVOCATE LANCETS MISC use once daily as directed to test blood sugar  . Blood Glucose Monitoring Suppl (FREESTYLE LITE) DEVI use once daily as directed to test blood sugar  . Cholecalciferol (VITAMIN D) 2000 units CAPS Take 1 capsule by mouth daily. otc  . Elderberry 575 MG/5ML SYRP Take 1 Dose by mouth daily.   . Marland KitchenREESTYLE LITE test strip USE  STRIP TO CHECK GLUCOSE ONCE DAILY AS DIRECTED  . JANUVIA 100 MG tablet Take 1 tablet by mouth once daily  . Lancet Devices (ADVOCATE LANCING DEVICE) MISC use once daily as directed to test blood sugar  . lansoprazole (PREVACID) 15 MG capsule Take 1 capsule (15 mg total) by mouth 1 day or 1 dose.  . Marland Kitchenisinopril-hydrochlorothiazide (ZESTORETIC) 20-12.5 MG tablet Take 1 tablet by mouth daily. Dr LaHolley Thornton. LORazepam (ATIVAN) 0.5 MG tablet Take 1 tablet (0.5 mg total) by mouth as needed.  . metFORMIN (GLUCOPHAGE) 500 MG tablet Take 1 tablet (500 mg total) by mouth 2 (two) times daily.  . Omega-3 Fatty Acids  (FISH OIL) 1000 MG CAPS Take 1 capsule (1,000 mg total) by mouth daily.  . sertraline (ZOLOFT) 25 MG tablet Take 1 tablet (25 mg total) by mouth daily.  . simvastatin (ZOCOR) 20 MG tablet Take 1 tablet (20 mg total) by mouth daily at 6 PM.   No facility-administered encounter medications on file as of 06/11/2020.    Wt Readings from Last 3 Encounters:  02/23/20 138 lb (62.6 kg)  02/14/20 138 lb 9.6 oz (62.9 kg)  01/01/20 138 lb (62.6 kg)    Current Diagnosis/Assessment:    Goals Addressed   None     Diabetes   A1c goal <7%  Recent Relevant Labs: Lab Results  Component Value Date/Time   HGBA1C 6.6 (H) 02/23/2020 11:05 AM   HGBA1C 6.4 (H) 09/11/2019 11:09 AM    Last diabetic Eye exam:  Lab Results  Component Value Date/Time   HMDIABEYEEXA No Retinopathy 07/19/2018 12:00 AM    Last diabetic Foot exam: No results found for: HMDIABFOOTEX   Checking BG: Daily  Recent FBG Readings:90s-120  Patient has failed these meds in past: glipizide (caused overnight hypoglycemia) Patient is currently controlled on the following medications: . Januvia 100 mg daily . Metformin 5001mid  We discussed: how to recognize and treat signs of hypoglycemia Jordan Thornton reports she has not experienced symptomatic hypoglycemia since stopping glipizide. She is checking FBG daily and is requesting a refill for test strips. She saw Jordan Thornton 9/03 and her eGFr had decreased to~26 ml/min SCR 1.96 . Metformin not discontinued  repeat labs 9/17 eGFR still ~26 with scr slightly improved at 1.92 (BUN down to 27 from 32). Jordan Thornton denies any follow up instructions for the Dr. Lateef's office. I reviewed lab values with her and advised her to call Dr. Lateef's office for follow up instructions regarding lab work.   Plan Discussed with Dr. Jones. Will decrease Januvia dose to 25 mg daily given eGFr< 30 mL/min, follow-up with Dr. Jones in 6 weeks for repeat labs. Left message for Dr. Lateef  regarding metformin. Left message for Jordan Thornton to return my call regarding the above.    Hyperlipidemia   LDL goal < 100  Lipid Panel     Component Value Date/Time   CHOL 193 11/17/2019 1001   TRIG 116 11/17/2019 1001   HDL 55 11/17/2019 1001   LDLCALC 117 (H) 11/17/2019 1001    Hepatic Function Latest Ref Rng & Units 10/17/2019 09/21/2019 09/20/2019  Total Protein 6.5 - 8.1 g/dL - 5.9(L) 6.1(L)  Albumin 3.6 - 4.6 g/dL 3.7 2.8(L) 3.0(L)  AST 15 - 41 U/L - 30 34  ALT 0 - 44 U/L - 19 20  Alk Phosphatase 38 - 126 U/L - 35(L) 34(L)  Total Bilirubin 0.3 - 1.2 mg/dL - 0.4 0.6  Bilirubin, Direct 0.00 - 0.40 mg/dL - - -     The ASCVD Risk score (Goff DC Jr., et al., 2013) failed to calculate for the following reasons:   The 2013 ASCVD risk score is only valid for ages 40 to 79   Patient has failed these meds in past: Unavailable in charet Patient is currently {CHL Controlled/Uncontrolled:2109141014} on the following medications:  . ***  We discussed:  {CHL HP [facility] discussion:2109141007}  Plan  Continue {CHL HP [facility] Pharmacy Plans:2109141003}  Hypertension   BP goal is:  {CHL HP [facility] Pharmacist BP ranges:2109141006}  Office blood pressures are  BP Readings from Last 3 Encounters:  02/23/20 130/80  02/14/20 138/72  01/01/20 118/72   BMP Latest Ref Rng & Units 10/17/2019 10/09/2019 09/21/2019  Glucose 65 - 99 mg/dL 115(H) 108(H) 185(H)  BUN 8 - 27 mg/dL 19 23 36(H)  Creatinine 0.57 - 1.00 mg/dL 1.66(H) 1.81(H) 1.50(H)  BUN/Creat Ratio 12 - 28 11(L) 13 -  Sodium 134 - 144 mmol/L 147(H) 145(H) 141  Potassium 3.5 - 5.2 mmol/L 4.9 5.0 4.5  Chloride 96 - 106 mmol/L 108(H) 107(H) 111  CO2 20 - 29 mmol/L 21 21 21(L)  Calcium 8.7 - 10.3 mg/dL 8.7 9.2 7.8(L)    Patient checks BP at home {CHL HP BP Monitoring Frequency:2109141004} Patient home BP readings are ranging: ***  Patient has failed these meds in the past: *** Patient is currently {CHL  Controlled/Uncontrolled:2109141014} on the following medications:  . ***  We discussed {CHL HP [facility] discussion:2109141007}  Plan  Continue {CHL HP [facility] Pharm[facility] Plans:2109141003}     Depression / Anxiety   PHQ9 Score:  PHQ9 SCORE ONLY 02/23/2020 02/14/2020 01/01/2020  PHQ-9 Total Score 0 0 1   GAD7 Score: GAD 7 : Generalized Anxiety Score 02/23/2020 01/01/2020 11/17/2019 09/11/2019  Nervous, Anxious, on Edge 0 0 0 0  Control/stop worrying 1 0 0 0  Worry too much - different things 1 0 0 1  Trouble relaxing 0 0 0 0  Restless 0 0 0 0  Easily annoyed or irritable 0 0 0 0  Afraid - awful might happen 0 0 0 1  Total GAD 7 Score 2 0 0 2  Anxiety Difficulty   Not difficult at all - - Not difficult at all    Patient has failed these meds in past: *** Patient is currently {CHL Controlled/Uncontrolled:2109141014} on the following medications:  . ***  We discussed:  ***  Plan  Continue {CHL HP [facility] Pharm[facility] Plans:2109141003}  GERD   Patient has failed these meds in past: *** Patient is currently {CHL Controlled/Uncontrolled:2109141014} on the following medications:  . ***  We discussed:  ***  Plan  Continue {CHL HP [facility] Plans:2109141003}    Medication Management   Pt uses *** pharmacy for all medications Uses pill box? {Yes or If no, why not?:20788} Pt endorses ***% compliance  We discussed: {Pharmacy options:24294}  Plan  {US Pharmacy Plan:23885}    Follow up: *** month phone visit  ***  

## 2020-06-11 NOTE — Chronic Care Management (AMB) (Signed)
Chronic Care Management Pharmacy  Name: Jordan Thornton  MRN: [id number] DOB: 24-Jul-1930   Chief Complaint/ HPI  Jordan Thornton,  86 y.o. , female presents for their Initial CCM visit with the clinical pharmacist via telephone due to COVID-19 Pandemic.  PCP : Jordan Patch, MD Patient Care Team: Jordan Patch, MD as PCP - General (Family Medicine) Jordan Legato, MD as Consulting Physician (Internal Medicine) Jordan Thornton, [facility] (Pharmacist)  Their chronic conditions include: Hypertension, Hyperlipidemia, Diabetes, GERD and Chronic Kidney Disease COVID 19 infection January 2021  Office Visits: 02/23/20-Jordan Thornton - Sertraline 36m qd, decrease lorazepam #20/30day. A1c   Consult Visit:05/17/20 - Dr. LHolley Thornton Nephrology - eGFR decreased to 287mmin, repeat lab Scr 1.92 eGFr 26  No Known Allergies  Medications: Outpatient Encounter Medications as of 06/11/2020  Medication Sig Note   Acetaminophen (TYLENOL ARTHRITIS PAIN PO) Take 2 tablets by mouth daily as needed.     ADVOCATE LANCETS MISC use once daily as directed to test blood sugar    Blood Glucose Monitoring Suppl (FREESTYLE LITE) DEVI use once daily as directed to test blood sugar    Cholecalciferol (VITAMIN D) 2000 units CAPS Take 1 capsule by mouth daily. otc    Elderberry 575 MG/5ML SYRP Take 1 Dose by mouth daily.     Lancet Devices (ADVOCATE LANCING DEVICE) MISC use once daily as directed to test blood sugar    lansoprazole (PREVACID) 15 MG capsule Take 1 capsule (15 mg total) by mouth 1 day or 1 dose.    lisinopril-hydrochlorothiazide (ZESTORETIC) 20-12.5 MG tablet Take 1 tablet by mouth daily. Dr Jordan Thornton  LORazepam (ATIVAN) 0.5 MG tablet Take 1 tablet (0.5 mg total) by mouth as needed.    metFORMIN (GLUCOPHAGE) 500 MG tablet Take 1 tablet (500 mg total) by mouth 2 (two) times daily.    Omega-3 Fatty Acids (FISH OIL) 1000 MG CAPS Take 1 capsule (1,000 mg total) by mouth daily.     sertraline (ZOLOFT) 25 MG tablet Take 1 tablet (25 mg total) by mouth daily. 06/11/2020: Has been taking prn   simvastatin (ZOCOR) 20 MG tablet Take 1 tablet (20 mg total) by mouth daily at 6 PM.    [DISCONTINUED] FREESTYLE LITE test strip USE  STRIP TO CHECK GLUCOSE ONCE DAILY AS DIRECTED    [DISCONTINUED] JANUVIA 100 MG tablet Take 1 tablet by mouth once daily    No facility-administered encounter medications on file as of 06/11/2020.    Wt Readings from Last 3 Encounters:  02/23/20 138 lb (62.6 kg)  02/14/20 138 lb 9.6 oz (62.9 kg)  01/01/20 138 lb (62.6 kg)        Goals Addressed            This Visit's Progress    DIET - DECREASE SODA OR JUICE INTAKE       Patient will decrease current intake of cranberry juice        Diabetes   A1c goal <8%  Recent Relevant Labs: Lab Results  Component Value Date/Time   HGBA1C 6.6 (H) 02/23/2020 11:05 AM   HGBA1C 6.4 (H) 09/11/2019 11:09 AM    Last diabetic Eye exam:  Lab Results  Component Value Date/Time   HMDIABEYEEXA No Retinopathy 07/19/2018 12:00 AM    Last diabetic Foot exam: No results found for: HMDIABFOOTEX   Checking BG: Daily  Recent FBG Readings:90s-120  Patient has failed these meds in past: glipizide (caused overnight hypoglycemia) Patient is currently controlled on the  following medications:  Januvia 100 mg daily  Metformin 536m bid  We discussed: how to recognize and treat signs of hypoglycemia Jordan Thornton reports she has not experienced symptomatic hypoglycemia since stopping glipizide. She is checking FBG daily and is requesting a refill for test strips.Patient drinks 16oz of cranberry juice with breakfast but otherwise avoids starchy/sugary foods and drinks. We discussed the high sugar content of juice and she will cut back her consumption. She saw Dr. LHolley Raringon 9/03 and her eGFr had decreased to~26 ml/min SCR 1.96 . Metformin not discontinued repeat labs 9/17 eGFR still ~26 with scr slightly  improved at 1.92 (BUN down to 27 from 32). Jordan Thornton any follow up instructions for the Dr. LElwyn Ladeoffice. I reviewed lab values with her and advised her to call Dr. LElwyn Ladeoffice for follow up instructions regarding lab work.   Plan Discussed with Dr. JRonnald Thornton Will decrease Januvia dose to 25 mg daily given eGFr< 30 mL/min, follow-up with Dr. JRonnald Rampin 6 weeks for repeat labs. Left message for Dr. LHolley Raringregarding metformin.  Refill sent for test strips. Update 06/12/20 8:45 AM received call back from Jordan Thornton got [facility]. She understands this is a dosage decrease due to renal dysfunction. She will follow up with Nephrology office today.    Hyperlipidemia   LDL goal < 100  Lipid Panel     Component Value Date/Time   CHOL 193 11/17/2019 1001   TRIG 116 11/17/2019 1001   HDL 55 11/17/2019 1001   LDLCALC 117 (H) 11/17/2019 1001    Hepatic Function Latest Ref Rng & Units 10/17/2019 09/21/2019 09/20/2019  Total Protein 6.5 - 8.1 g/dL - 5.9(L) 6.1(L)  Albumin 3.6 - 4.6 g/dL 3.7 2.8(L) 3.0(L)  AST 15 - 41 U/L - 30 34  ALT 0 - 44 U/L - 19 20  Alk Phosphatase 38 - 126 U/L - 35(L) 34(L)  Total Bilirubin 0.3 - 1.2 mg/dL - 0.4 0.6  Bilirubin, Direct 0.00 - 0.40 mg/dL - - -     The ASCVD Risk score (Jordan BussingDC Jr., et al., 2013) failed to calculate for the following reasons:   The 2013 ASCVD risk score is only valid for ages 461to 777  Patient has failed these meds in past: Unavailable in chart Patient is currently uncontrolled on the following medications:   Simvastatin 20 mg q 6pm  We discussed:  diet and exercise extensively. Patient does not have a structured exercise regimen but is very active cleaning house, cooking meals and still plays the organ at church.  Plan   Continue current medications   Hypertension/CKDIIIB   BP goal is:  <130/80  Office blood pressures are  BP Readings  from Last 3 Encounters:  02/23/20 130/80  02/14/20 138/72  01/01/20 118/72   BMP Latest Ref Rng & Units 10/17/2019 10/09/2019 09/21/2019  Glucose 65 - 99 mg/dL 115(H) 108(H) 185(H)  BUN 8 - 27 mg/dL 19 23 36(H)  Creatinine 0.57 - 1.00 mg/dL 1.66(H) 1.81(H) 1.50(H)  BUN/Creat Ratio 12 - 28 11(L) 13 -  Sodium 134 - 144 mmol/L 147(H) 145(H) 141  Potassium 3.5 - 5.2 mmol/L 4.9 5.0 4.5  Chloride 96 - 106 mmol/L 108(H) 107(H) 111  CO2 20 - 29 mmol/L 21 21 21(L)  Calcium 8.7 - 10.3 mg/dL 8.7 9.2 7.8(L)  most recent eGFR 9/71/21~235mmin (Nephrology following)  Patient checks BP at home  infrequently   Patient has failed these meds in the past: N/A Patient is currently controlled on the following medications:   Lisinopril/hctz 20/12.57m daily  We discussed: Patient has a blood pressure cuff at home but has quit checking stating her BP were always elevated at home while controlled at office visits. We discussed masked hypertension and I advised her to bring in her electronic cuff to next PCP appointment to compare readings. Recent labs at Nephrology indicate decreased renal function eGFR~26.   Plan  Consider decreasing lisinopril dose given recent decline in renal function.      Anxiety   PHQ9 Score:  PHQ9 SCORE ONLY 02/23/2020 02/14/2020 01/01/2020  PHQ-9 Total Score 0 0 1   GAD7 Score: GAD 7 : Generalized Anxiety Score 02/23/2020 01/01/2020 11/17/2019 09/11/2019  Nervous, Anxious, on Edge 0 0 0 0  Control/stop worrying 1 0 0 0  Worry too much - different things 1 0 0 1  Trouble relaxing 0 0 0 0  Restless 0 0 0 0  Easily annoyed or irritable 0 0 0 0  Afraid - awful might happen 0 0 0 1  Total GAD 7 Score 2 0 0 2  Anxiety Difficulty Not difficult at all - - Not difficult at all    Patient has failed these meds in past: N/A Patient is currently controlled on the following medications:   Lorazepam 0.5 mg daily prn (#20 must last 30 days)  Sertraline 25 mg daily (has been taking  prn)  We discussed:  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 Counseled that sertraline is a maintenance med and should be taken daily to help prevent anxiety/nervousness.   Plan  Continue current medications  GERD   Patient has failed these meds in past: N/A Patient is currently controlled on the following medications:   Lansoprazole 15 mg ac breakfast  We discussed: Patient has noted that symptoms are more severe when she eats beef. When husbands schedule allows (he still works) they have dinner at 4:30-5:00 pm. She avoids snacking before bed and reports good control on this regimen.  Plan  Continue current medications     Medication Management   Pt uses [facility] pharmacy for all medications Pt endorses 99% compliance  We discussed: Discussed benefits of medication synchronization, packaging and delivery as well as enhanced pharmacist oversight with [facility]. She is currently happy with [facility] but glad to know this is an option.  Plan  Continue current medication management strategy    Follow up: 2  month phone visit  JJunita Push HKenton KingfisherPharmD, [facility]leveland[facility] [phone]

## 2020-06-12 ENCOUNTER — Other Ambulatory Visit: Payer: Self-pay

## 2020-06-12 ENCOUNTER — Telehealth: Payer: Self-pay | Admitting: Family Medicine

## 2020-06-12 DIAGNOSIS — E119 Type 2 diabetes mellitus without complications: Secondary | ICD-10-CM

## 2020-06-12 MED ORDER — SITAGLIPTIN PHOSPHATE 25 MG PO TABS: 100.0000 mg | ORAL_TABLET | Freq: Every day | ORAL | 1 refills | Status: DC

## 2020-06-12 NOTE — Patient Instructions (Addendum)
Visit Information:  Jordan Thornton,  It was a pleasure speaking with you today! Keep up the great work in managing your health! Thank you for letting me be a part of your care team. Please call with any questions or concerns.  Goals Addressed            This Visit's Progress   . DIET - DECREASE SODA OR JUICE INTAKE       Patient will decrease current intake of cranberry juice        Jordan Thornton was given information about Chronic Care Management services today including:  1. CCM service includes personalized support from designated clinical staff supervised by her physician, including individualized plan of care and coordination with other care providers 2. 24/7 contact phone numbers for assistance for urgent and routine care needs. 3. Standard insurance, coinsurance, copays and deductibles apply for chronic care management only during months in which we provide at least 20 minutes of these services. Most insurances cover these services at 100%, however patients may be responsible for any copay, coinsurance and/or deductible if applicable. This service may help you avoid the need for more expensive face-to-face services. 4. Only one practitioner may furnish and bill the service in a calendar month. 5. The patient may stop CCM services at any time (effective at the end of the month) by phone call to the office staff.  Patient agreed to services and verbal consent obtained.   The patient verbalized understanding of instructions provided today and agreed to receive a mailed copy of patient instruction and/or educational materials. Telephone follow up appointment with pharmacy team member scheduled for: 06/27/20 at 11:15  Junita Push. Jordan Thornton PharmD, BCPS Clinical Pharmacist [phone]  Glomerular Filtration Rate Test Why am I having this test? Glomerular filtration rate (GFR) is a measurement of how well your kidneys are functioning. You may have this test to diagnose or assess the  severity (stage) of kidney disease. This test is sometimes also called a calculated, or estimated, glomerular filtration rate (eGFR). What is being tested? Your GFR is estimated or calculated based on your:  Creatinine level in your blood. Creatinine is a waste product from your muscles. Healthy kidneys filter creatinine out of your blood.  Age.  Gender.  Race.  Height.  Weight. What kind of sample is taken?  A blood sample is required for this test. It is usually collected by inserting a needle into a blood vessel. Tell a health care provider about:  All medicines you are taking, including vitamins, herbs, eye drops, creams, and over-the-counter medicines.  Whether you are pregnant or may be pregnant. How are the results reported? Your results will be reported as milliliters of creatinine per minute (mL/min). Your health care provider will compare your results to a normal range that was established after testing a large group of people (reference range). Reference ranges may vary among labs and hospitals. For this test, a common reference range is 90-120 mL/min. What do the results mean? A result within the reference range is considered normal, meaning that your kidneys are filtering creatinine from your blood normally. A GFR below 60 mL/min for 3 months or longer is a sign of long-term (chronic) kidney disease. You may also have other tests to help your health care provider diagnose kidney disease. These tests may include:  Urine tests.  Removal and examination of a piece of kidney tissue (biopsy).  Imaging tests such as an ultrasound, X-ray, or MRI. If you are diagnosed with kidney  disease, your GFR can also indicate your stage of kidney disease:  GFR of 60-89 mL/min means mild kidney disease (stage 2).  GFR of 30-59 mL/min indicates moderate kidney disease (stage 3).  GFR of 15-29 mL/min indicates severe kidney disease (stage 4).  GFR of less than 15 mL/min indicates  kidney failure (stage 5). Talk with your health care provider about what your results mean. Questions to ask your health care provider Ask your health care provider, or the department that is doing the test:  When will my results be ready?  How will I get my results?  What are my treatment options?  What other tests do I need?  What are my next steps? Summary  Glomerular filtration rate (GFR) is a measurement of how well your kidneys are functioning. You may have this test to diagnose or assess the stage of kidney disease.  Your GFR is estimated or calculated based on the creatinine level in your blood and several other factors, such as age, gender, race, height, and weight.  A GFR below 60 for 3 months or longer is a sign of chronic kidney disease. You may also have other tests to help your health care provider diagnose kidney disease. This information is not intended to replace advice given to you by your health care provider. Make sure you discuss any questions you have with your health care provider. Document Revised: 09/15/2017 Document Reviewed: 04/21/2017 Elsevier Patient Education  [place]. Sitagliptin oral tablet What is this medicine? SITAGLIPTIN (sit a GLIP tin) helps to treat type 2 diabetes. It helps to control blood sugar. Treatment is combined with diet and exercise. This medicine may be used for other purposes; ask your health care provider or pharmacist if you have questions. COMMON BRAND NAME(S): Januvia What should I tell my health care provider before I take this medicine? They need to know if you have any of these conditions:  diabetic ketoacidosis  kidney disease  pancreatitis  previous swelling of the tongue, face, or lips with difficulty breathing, difficulty swallowing, hoarseness, or tightening of the throat  type 1 diabetes  an unusual or allergic reaction to sitagliptin, other medicines, foods, dyes, or preservatives  pregnant or  trying to get pregnant  breast-feeding How should I use this medicine? Take this medicine by mouth with a glass of water. Follow the directions on the prescription label. You can take it with or without food. Do not cut, crush or chew this medicine. Take your dose at the same time each day. Do not take more often than directed. Do not stop taking except on your doctor's advice. A special MedGuide will be given to you by the pharmacist with each prescription and refill. Be sure to read this information carefully each time. Talk to your pediatrician regarding the use of this medicine in children. Special care may be needed. Overdosage: If you think you have taken too much of this medicine contact a poison control center or emergency room at once. NOTE: This medicine is only for you. Do not share this medicine with others. What if I miss a dose? If you miss a dose, take it as soon as you can. If it is almost time for your next dose, take only that dose. Do not take double or extra doses. What may interact with this medicine? Do not take this medicine with any of the following medications:  gatifloxacin This medicine may also interact with the following medications:  alcohol  digoxin  insulin  sulfonylureas like glimepiride, glipizide, glyburide This list may not describe all possible interactions. Give your health care provider a list of all the medicines, herbs, non-prescription drugs, or dietary supplements you use. Also tell them if you smoke, drink alcohol, or use illegal drugs. Some items may interact with your medicine. What should I watch for while using this medicine? Visit your doctor or health care professional for regular checks on your progress. A test called the HbA1C (A1C) will be monitored. This is a simple blood test. It measures your blood sugar control over the last 2 to 3 months. You will receive this test every 3 to 6 months. Learn how to check your blood sugar. Learn the  symptoms of low and high blood sugar and how to manage them. Always carry a quick-source of sugar with you in case you have symptoms of low blood sugar. Examples include hard sugar candy or glucose tablets. Make sure others know that you can choke if you eat or drink when you develop serious symptoms of low blood sugar, such as seizures or unconsciousness. They must get medical help at once. Tell your doctor or health care professional if you have high blood sugar. You might need to change the dose of your medicine. If you are sick or exercising more than usual, you might need to change the dose of your medicine. Do not skip meals. Ask your doctor or health care professional if you should avoid alcohol. Many nonprescription cough and cold products contain sugar or alcohol. These can affect blood sugar. Wear a medical ID bracelet or chain, and carry a card that describes your disease and details of your medicine and dosage times. What side effects may I notice from receiving this medicine? Side effects that you should report to your doctor or health care professional as soon as possible:  allergic reactions like skin rash, itching or hives, swelling of the face, lips, or tongue  breathing problems  general ill feeling or flu-like symptoms  joint pain  loss of appetite  redness, blistering, peeling or loosening of the skin, including inside the mouth  signs and symptoms of heart failure like breathing problems, fast, irregular heartbeat, sudden weight gain; swelling of the ankles, feet, hands; unusually weak or tired  signs and symptoms of low blood sugar such as feeling anxious, confusion, dizziness, increased hunger, unusually weak or tired, sweating, shakiness, cold, irritable, headache, blurred vision, fast heartbeat, loss of consciousness  signs and symptoms of muscle injury like dark urine; trouble passing urine or change in the amount of urine; unusually weak or tired; muscle pain; back  pain  unusual stomach upset or pain  vomiting Side effects that usually do not require medical attention (report to your doctor or health care professional if they continue or are bothersome):  diarrhea  headache  sore throat  stomach upset  stuffy or runny nose This list may not describe all possible side effects. Call your doctor for medical advice about side effects. You may report side effects to FDA at 1-800-FDA-1088. Where should I keep my medicine? Keep out of the reach of children. Store at room temperature between 15 and 30 degrees C (59 and 86 degrees F). Throw away any unused medicine after the expiration date. NOTE: This sheet is a summary. It may not cover all possible information. If you have questions about this medicine, talk to your doctor, pharmacist, or health care provider.  2020 Elsevier/Gold Standard (2018-04-05 16:38:43)

## 2020-06-12 NOTE — Telephone Encounter (Signed)
Pharmacy calling to advise pt insurance will not pay for sitaGLIPtin (JANUVIA) 25 MG tablet  4 /day They will cover the 100MG  Can you send in new Rx for   sitaGLIPtin (JANUVIA) 100 MG tablet  [place], [address] Phone:  [phone]  Fax:  [phone]

## 2020-06-13 ENCOUNTER — Telehealth: Payer: Self-pay | Admitting: Pharmacist

## 2020-06-13 ENCOUNTER — Other Ambulatory Visit: Payer: Self-pay

## 2020-06-13 DIAGNOSIS — E119 Type 2 diabetes mellitus without complications: Secondary | ICD-10-CM

## 2020-06-13 MED ORDER — SITAGLIPTIN PHOSPHATE 25 MG PO TABS: 25.0000 mg | ORAL_TABLET | Freq: Every day | ORAL | 1 refills | Status: DC

## 2020-06-13 NOTE — Progress Notes (Signed)
°  Chronic Care Management   Note  06/13/2020 Name: [place] McCoy Townsend MRN: [id number] DOB: 05-26-1930    Received call from Mrs. Souffrant stating insurance rejected her Januvia 25 mg prescription. Pharmacy team is reaching out to [facility] for rejection reason and to determine next steps. Informed patient and will follow-up. Patient to call Nephrology office for metformin guidance today.   Junita Push. Kenton King[place]isher PharmD, [facility] [phone]

## 2020-06-13 NOTE — Progress Notes (Signed)
° ° °  Chronic Care Management Pharmacy Assistant   Name: Jordan Thornton  MRN: [id number] DOB: 1930-05-16  Reason for Encounter: Medication Review   PCP : Juline Patch, MD  Allergies:  No Known Allergies  Medications: Outpatient Encounter Medications as of 06/13/2020  Medication Sig Note   Acetaminophen (TYLENOL ARTHRITIS PAIN PO) Take 2 tablets by mouth daily as needed.     ADVOCATE LANCETS MISC use once daily as directed to test blood sugar    Blood Glucose Monitoring Suppl (FREESTYLE LITE) DEVI use once daily as directed to test blood sugar    Cholecalciferol (VITAMIN D) 2000 units CAPS Take 1 capsule by mouth daily. otc    Elderberry 575 MG/5ML SYRP Take 1 Dose by mouth daily.     glucose blood (FREESTYLE LITE) test strip USE  STRIP TO CHECK GLUCOSE ONCE DAILY AS DIRECTED    Lancet Devices (ADVOCATE LANCING DEVICE) MISC use once daily as directed to test blood sugar    lansoprazole (PREVACID) 15 MG capsule Take 1 capsule (15 mg total) by mouth 1 day or 1 dose.    lisinopril-hydrochlorothiazide (ZESTORETIC) 20-12.5 MG tablet Take 1 tablet by mouth daily. Dr Holley Raring    LORazepam (ATIVAN) 0.5 MG tablet Take 1 tablet (0.5 mg total) by mouth as needed.    metFORMIN (GLUCOPHAGE) 500 MG tablet Take 1 tablet (500 mg total) by mouth 2 (two) times daily.    Omega-3 Fatty Acids (FISH OIL) 1000 MG CAPS Take 1 capsule (1,000 mg total) by mouth daily.    sertraline (ZOLOFT) 25 MG tablet Take 1 tablet (25 mg total) by mouth daily. 06/11/2020: Has been taking prn   simvastatin (ZOCOR) 20 MG tablet Take 1 tablet (20 mg total) by mouth daily at 6 PM.    sitaGLIPtin (JANUVIA) 25 MG tablet Take 1 tablet (25 mg total) by mouth daily.    [DISCONTINUED] sitaGLIPtin (JANUVIA) 25 MG tablet Take 4 tablets (100 mg total) by mouth daily.    No facility-administered encounter medications on file as of 06/13/2020.    Current Diagnosis: Patient Active Problem List   Diagnosis Date Noted     Acute hypoxemic respiratory failure due to COVID-19 ([place]) 09/19/2019   Stage 3 chronic kidney disease 09/02/2019   Diabetes mellitus with no complication ([place]) 15/17/6160   Familial multiple lipoprotein-type hyperlipidemia 01/03/2015   Creatinine elevation 01/03/2015   Diabetes ([place]) 12/15/2014   Hyperlipidemia 12/15/2014   Hypertension 12/15/2014   Gastro-esophageal reflux disease without esophagitis 12/15/2014       06/13/20-Called [facility] pharmacy and confirmed updated Rx of Januvia 25mg  1 tablet daily. Patient has coverage for a 30DS with a co-pay of $128.85. Pharmacist also states patient's prior Rx of Januvia 100mg  was 90DS with a co-pay of $90.    Follow-Up:  Pharmacist Review     Raynelle Highland, [place] Assistant [phone]

## 2020-06-13 NOTE — Telephone Encounter (Signed)
Was supposed to be on 1 a day instead of 4- sent in new prescription

## 2020-06-17 ENCOUNTER — Other Ambulatory Visit: Payer: Self-pay

## 2020-06-17 ENCOUNTER — Telehealth: Payer: Self-pay | Admitting: Pharmacist

## 2020-06-17 ENCOUNTER — Ambulatory Visit (INDEPENDENT_AMBULATORY_CARE_PROVIDER_SITE_OTHER): Payer: PPO | Admitting: Family Medicine

## 2020-06-17 ENCOUNTER — Encounter: Payer: Self-pay | Admitting: Family Medicine

## 2020-06-17 VITALS — BP 130/80 | HR 64 | Ht 65.0 in | Wt 140.0 lb

## 2020-06-17 DIAGNOSIS — N3090 Cystitis, unspecified without hematuria: Secondary | ICD-10-CM | POA: Diagnosis not present

## 2020-06-17 LAB — POCT URINALYSIS DIPSTICK
Bilirubin, UA: NEGATIVE
Blood, UA: POSITIVE
Glucose, UA: NEGATIVE
Ketones, UA: NEGATIVE
Nitrite, UA: NEGATIVE
Protein, UA: NEGATIVE
Spec Grav, UA: 1.02
Urobilinogen, UA: 0.2 U/dL
pH, UA: 6

## 2020-06-17 MED ORDER — CEPHALEXIN 500 MG PO CAPS: 500.0000 mg | ORAL_CAPSULE | Freq: Three times a day (TID) | ORAL | 0 refills | Status: DC

## 2020-06-17 NOTE — Progress Notes (Addendum)
    Chronic Care Management Pharmacy Assistant   Name: Jordan Thornton  MRN: [id number] DOB: [birth year]  Reason for Encounter: Medication Review   PCP : Juline Patch, MD  Allergies:  No Known Allergies  Medications: Outpatient Encounter Medications as of 06/17/2020  Medication Sig Note   Acetaminophen (TYLENOL ARTHRITIS PAIN PO) Take 2 tablets by mouth daily as needed.     ADVOCATE LANCETS MISC use once daily as directed to test blood sugar    Blood Glucose Monitoring Suppl (FREESTYLE LITE) DEVI use once daily as directed to test blood sugar    cephALEXin (KEFLEX) 500 MG capsule Take 1 capsule (500 mg total) by mouth 3 (three) times daily.    Cholecalciferol (VITAMIN D) 2000 units CAPS Take 1 capsule by mouth daily. otc    Elderberry 575 MG/5ML SYRP Take 1 Dose by mouth daily.     glucose blood (FREESTYLE LITE) test strip USE  STRIP TO CHECK GLUCOSE ONCE DAILY AS DIRECTED    Lancet Devices (ADVOCATE LANCING DEVICE) MISC use once daily as directed to test blood sugar    lansoprazole (PREVACID) 15 MG capsule Take 1 capsule (15 mg total) by mouth 1 day or 1 dose.    lisinopril-hydrochlorothiazide (ZESTORETIC) 20-12.5 MG tablet Take 1 tablet by mouth daily. Dr Holley Raring    LORazepam (ATIVAN) 0.5 MG tablet Take 1 tablet (0.5 mg total) by mouth as needed.    Omega-3 Fatty Acids (FISH OIL) 1000 MG CAPS Take 1 capsule (1,000 mg total) by mouth daily.    sertraline (ZOLOFT) 25 MG tablet Take 1 tablet (25 mg total) by mouth daily. 06/11/2020: Has been taking prn   simvastatin (ZOCOR) 20 MG tablet Take 1 tablet (20 mg total) by mouth daily at 6 PM.    sitaGLIPtin (JANUVIA) 25 MG tablet Take 1 tablet (25 mg total) by mouth daily.    No facility-administered encounter medications on file as of 06/17/2020.    Current Diagnosis: Patient Active Problem List   Diagnosis Date Noted   Acute hypoxemic respiratory failure due to COVID-19 ([place]) 09/19/2019   Stage 3 chronic kidney disease ([place])  09/02/2019   Diabetes mellitus with no complication ([place]) 19/41/7408   Familial multiple lipoprotein-type hyperlipidemia 01/03/2015   Creatinine elevation 01/03/2015   Diabetes ([place]) 12/15/2014   Hyperlipidemia 12/15/2014   Hypertension 12/15/2014   Gastro-esophageal reflux disease without esophagitis 12/15/2014     06/17/20-Called and spoke with patient regarding her Januvia 25mg  medication. Patient states the original cost for the Januvia 25mg  was around $591, however insurance was able to deduct the cost down to $128.85 (30DS). Patient states she was able to pay the co-pay for the Januvia 25mg  (30DS) and start medication on 06/14/20. Will initiate patient assistance program paperwork for Januvia. Patient also stated the Nephrologist Dr. Holley Raring has discontinued her Metformin medication on 06/14/20 because careprovider thought it would do damage to kidneys. Patient stated she was seen in Dr. Ronnald Ramp office on 06/17/20 for UTI and was placed Cephalexin 500mg , 3 times a day.    Follow-Up:  Pharmacist Review    Raynelle Highland, [place] Assistant [phone]

## 2020-06-17 NOTE — Progress Notes (Signed)
Date:  06/17/2020   Name:  Jordan Thornton   DOB:  1929/10/12   MRN:  [id number]   Chief Complaint: Urinary Tract Infection (pressure/pain after stream stops)  Urinary Tract Infection  This is a new problem. The current episode started in the past 7 days. The problem occurs every urination. The problem has been gradually worsening. The quality of the pain is described as burning. There has been no fever. Associated symptoms include frequency and urgency. Pertinent negatives include no chills, discharge, flank pain, hematuria, hesitancy, nausea, sweats or vomiting. She has tried nothing for the symptoms. The treatment provided mild relief.    Lab Results  Component Value Date   CREATININE 1.66 (H) 10/17/2019   BUN 19 10/17/2019   NA 147 (H) 10/17/2019   K 4.9 10/17/2019   CL 108 (H) 10/17/2019   CO2 21 10/17/2019   Lab Results  Component Value Date   CHOL 193 11/17/2019   HDL 55 11/17/2019   LDLCALC 117 (H) 11/17/2019   TRIG 116 11/17/2019   CHOLHDL 2.8 03/02/2018   No results found for: TSH Lab Results  Component Value Date   HGBA1C 6.6 (H) 02/23/2020   Lab Results  Component Value Date   WBC 5.6 10/17/2019   HGB 9.9 (L) 10/17/2019   HCT 30.0 (L) 10/17/2019   MCV 79 10/17/2019   PLT 200 10/17/2019   Lab Results  Component Value Date   ALT 19 09/21/2019   AST 30 09/21/2019   ALKPHOS 35 (L) 09/21/2019   BILITOT 0.4 09/21/2019     Review of Systems  Constitutional: Negative.  Negative for chills, fatigue, fever and unexpected weight change.  HENT: Negative for congestion, ear discharge, ear pain, rhinorrhea, sinus pressure, sneezing and sore throat.   Eyes: Negative for photophobia, pain, discharge, redness and itching.  Respiratory: Negative for cough, shortness of breath, wheezing and stridor.   Gastrointestinal: Negative for abdominal pain, blood in stool, constipation, diarrhea, nausea and vomiting.  Endocrine: Negative for cold intolerance, heat  intolerance, polydipsia, polyphagia and polyuria.  Genitourinary: Positive for frequency and urgency. Negative for dysuria, flank pain, hematuria, hesitancy, menstrual problem, pelvic pain, vaginal bleeding and vaginal discharge.  Musculoskeletal: Negative for arthralgias, back pain and myalgias.  Skin: Negative for rash.  Allergic/Immunologic: Negative for environmental allergies and food allergies.  Neurological: Negative for dizziness, weakness, light-headedness, numbness and headaches.  Hematological: Negative for adenopathy. Does not bruise/bleed easily.  Psychiatric/Behavioral: Negative for dysphoric mood. The patient is not nervous/anxious.     Patient Active Problem List   Diagnosis Date Noted  . Acute hypoxemic respiratory failure due to COVID-19 ([place]) 09/19/2019  . Stage 3 chronic kidney disease ([place]) 09/02/2019  . Diabetes mellitus with no complication ([place]) 50/05/3817  . Familial multiple lipoprotein-type hyperlipidemia 01/03/2015  . Creatinine elevation 01/03/2015  . Diabetes ([place]) 12/15/2014  . Hyperlipidemia 12/15/2014  . Hypertension 12/15/2014  . Gastro-esophageal reflux disease without esophagitis 12/15/2014    No Known Allergies  Past Surgical History:  Procedure Laterality Date  . bladder tack    . BREAST BIOPSY Left 10-15 yrs ago   benign  . CATARACT EXTRACTION Bilateral 05/2015  . CHOLECYSTECTOMY OPEN    . VAGINAL HYSTERECTOMY      Social History   Tobacco Use  . Smoking status: Former Smoker    Packs/day: 0.25    Years: 25.00    Pack years: 6.25    Types: Cigarettes    Quit date: 1982    Years  since quitting: 39.7  . Smokeless tobacco: Never Used  . Tobacco comment: smoking cessation materials not required  Vaping Use  . Vaping Use: Never used  Substance Use Topics  . Alcohol use: No    Alcohol/week: 0.0 standard drinks  . Drug use: No     Medication list has been reviewed and updated.  Current Meds  Medication Sig  . Acetaminophen  (TYLENOL ARTHRITIS PAIN PO) Take 2 tablets by mouth daily as needed.   Marland Kitchen ADVOCATE LANCETS MISC use once daily as directed to test blood sugar  . Blood Glucose Monitoring Suppl (FREESTYLE LITE) DEVI use once daily as directed to test blood sugar  . Cholecalciferol (VITAMIN D) 2000 units CAPS Take 1 capsule by mouth daily. otc  . Elderberry 575 MG/5ML SYRP Take 1 Dose by mouth daily.   Marland Kitchen glucose blood (FREESTYLE LITE) test strip USE  STRIP TO CHECK GLUCOSE ONCE DAILY AS DIRECTED  . Lancet Devices (ADVOCATE LANCING DEVICE) MISC use once daily as directed to test blood sugar  . lansoprazole (PREVACID) 15 MG capsule Take 1 capsule (15 mg total) by mouth 1 day or 1 dose.  Marland Kitchen lisinopril-hydrochlorothiazide (ZESTORETIC) 20-12.5 MG tablet Take 1 tablet by mouth daily. Dr Holley Raring  . LORazepam (ATIVAN) 0.5 MG tablet Take 1 tablet (0.5 mg total) by mouth as needed.  . Omega-3 Fatty Acids (FISH OIL) 1000 MG CAPS Take 1 capsule (1,000 mg total) by mouth daily.  . sertraline (ZOLOFT) 25 MG tablet Take 1 tablet (25 mg total) by mouth daily.  . simvastatin (ZOCOR) 20 MG tablet Take 1 tablet (20 mg total) by mouth daily at 6 PM.  . sitaGLIPtin (JANUVIA) 25 MG tablet Take 1 tablet (25 mg total) by mouth daily.  . [DISCONTINUED] metFORMIN (GLUCOPHAGE) 500 MG tablet Take 1 tablet (500 mg total) by mouth 2 (two) times daily.    PHQ 2/9 Scores 06/17/2020 02/23/2020 02/14/2020 01/01/2020  PHQ - 2 Score 0 0 0 1  PHQ- 9 Score 0 0 - 1    GAD 7 : Generalized Anxiety Score 06/17/2020 02/23/2020 01/01/2020 11/17/2019  Nervous, Anxious, on Edge 0 0 0 0  Control/stop worrying 0 1 0 0  Worry too much - different things 0 1 0 0  Trouble relaxing 0 0 0 0  Restless 0 0 0 0  Easily annoyed or irritable 0 0 0 0  Afraid - awful might happen 0 0 0 0  Total GAD 7 Score 0 2 0 0  Anxiety Difficulty - Not difficult at all - -    BP Readings from Last 3 Encounters:  06/17/20 130/80  02/23/20 130/80  02/14/20 138/72    Physical  Exam Vitals and nursing note reviewed.  Constitutional:      General: She is not in acute distress.    Appearance: She is not diaphoretic.  HENT:     Head: Normocephalic and atraumatic.     Right Ear: Tympanic membrane, ear canal and external ear normal. There is no impacted cerumen.     Left Ear: Tympanic membrane, ear canal and external ear normal. There is no impacted cerumen.     Nose: Nose normal. No congestion or rhinorrhea.     Mouth/Throat:     Mouth: Mucous membranes are moist.  Eyes:     General:        Right eye: No discharge.        Left eye: No discharge.     Conjunctiva/sclera: Conjunctivae normal.  Pupils: Pupils are equal, round, and reactive to light.  Neck:     Thyroid: No thyromegaly.     Vascular: No JVD.  Cardiovascular:     Rate and Rhythm: Normal rate and regular rhythm.     Heart sounds: Normal heart sounds. No murmur heard.  No friction rub. No gallop.   Pulmonary:     Effort: Pulmonary effort is normal.     Breath sounds: Normal breath sounds. No wheezing or rhonchi.  Abdominal:     General: Bowel sounds are normal.     Palpations: Abdomen is soft. There is no hepatomegaly, splenomegaly or mass.     Tenderness: There is no abdominal tenderness. There is no right CVA tenderness, left CVA tenderness or guarding.  Musculoskeletal:        General: Normal range of motion.     Cervical back: Normal range of motion and neck supple.  Lymphadenopathy:     Cervical: No cervical adenopathy.  Skin:    General: Skin is warm and dry.  Neurological:     Mental Status: She is alert.     Deep Tendon Reflexes: Reflexes are normal and symmetric.     Wt Readings from Last 3 Encounters:  06/17/20 140 lb (63.5 kg)  02/23/20 138 lb (62.6 kg)  02/14/20 138 lb 9.6 oz (62.9 kg)    BP 130/80   Pulse 64   Ht 5\' 5"  (1.651 m)   Wt 140 lb (63.5 kg)   BMI 23.30 kg/m   Assessment and Plan: 1. Cystitis Acute.  Persistent.  Relatively stable.  Urinalysis is  consistent with cystitis with presence of leukocytes and blood.  Exam notes no CVA tenderness or suprapubic tenderness.  We will initiate cephalexin 500 mg 3 times a day for 5 days.

## 2020-06-17 NOTE — Addendum Note (Signed)
Addended by: Fredderick Severance on: 06/17/2020 12:01 PM   Modules accepted: Orders

## 2020-06-25 ENCOUNTER — Ambulatory Visit (INDEPENDENT_AMBULATORY_CARE_PROVIDER_SITE_OTHER): Payer: PPO | Admitting: Family Medicine

## 2020-06-25 ENCOUNTER — Other Ambulatory Visit: Payer: Self-pay

## 2020-06-25 ENCOUNTER — Encounter: Payer: Self-pay | Admitting: Family Medicine

## 2020-06-25 VITALS — BP 130/80 | HR 80 | Ht 65.0 in | Wt 139.0 lb

## 2020-06-25 DIAGNOSIS — E119 Type 2 diabetes mellitus without complications: Secondary | ICD-10-CM | POA: Diagnosis not present

## 2020-06-25 MED ORDER — SITAGLIPTIN PHOSPHATE 25 MG PO TABS: 25.0000 mg | ORAL_TABLET | Freq: Every day | ORAL | 1 refills | Status: DC

## 2020-06-25 NOTE — Progress Notes (Signed)
Date:  06/25/2020   Name:  Jordan Thornton   DOB:  09/23/1929   MRN:  [id number]   Chief Complaint: Diabetes (4 months follow up- 107)  Diabetes She presents for her follow-up diabetic visit. She has type 2 diabetes mellitus. Her disease course has been stable. There are no hypoglycemic associated symptoms. Pertinent negatives for hypoglycemia include no dizziness, headaches or nervousness/anxiousness. There are no diabetic associated symptoms. Pertinent negatives for diabetes include no blurred vision, no chest pain, no fatigue, no foot paresthesias, no foot ulcerations, no polydipsia, no polyphagia, no polyuria, no visual change, no weakness and no weight loss. There are no hypoglycemic complications. Symptoms are stable. There are no diabetic complications. Risk factors for coronary artery disease include dyslipidemia and hypertension. Current diabetic treatment includes oral agent (dual therapy). Her weight is stable. She is following a generally healthy diet. Meal planning includes carbohydrate counting and avoidance of concentrated sweets. She participates in exercise intermittently. Her breakfast blood glucose is taken between 8-9 am. Her breakfast blood glucose range is generally 110-130 mg/dl. An ACE inhibitor/angiotensin II receptor blocker is being taken.    Lab Results  Component Value Date   CREATININE 1.66 (H) 10/17/2019   BUN 19 10/17/2019   NA 147 (H) 10/17/2019   K 4.9 10/17/2019   CL 108 (H) 10/17/2019   CO2 21 10/17/2019   Lab Results  Component Value Date   CHOL 193 11/17/2019   HDL 55 11/17/2019   LDLCALC 117 (H) 11/17/2019   TRIG 116 11/17/2019   CHOLHDL 2.8 03/02/2018   No results found for: TSH Lab Results  Component Value Date   HGBA1C 6.6 (H) 02/23/2020   Lab Results  Component Value Date   WBC 5.6 10/17/2019   HGB 9.9 (L) 10/17/2019   HCT 30.0 (L) 10/17/2019   MCV 79 10/17/2019   PLT 200 10/17/2019   Lab Results  Component Value Date    ALT 19 09/21/2019   AST 30 09/21/2019   ALKPHOS 35 (L) 09/21/2019   BILITOT 0.4 09/21/2019     Review of Systems  Constitutional: Negative.  Negative for chills, fatigue, fever, unexpected weight change and weight loss.  HENT: Negative for congestion, ear discharge, ear pain, rhinorrhea, sinus pressure, sneezing and sore throat.   Eyes: Negative for blurred vision, photophobia, pain, discharge, redness and itching.  Respiratory: Negative for cough, shortness of breath, wheezing and stridor.   Cardiovascular: Negative for chest pain.  Gastrointestinal: Negative for abdominal pain, blood in stool, constipation, diarrhea, nausea and vomiting.  Endocrine: Negative for cold intolerance, heat intolerance, polydipsia, polyphagia and polyuria.  Genitourinary: Negative for dysuria, flank pain, frequency, hematuria, menstrual problem, pelvic pain, urgency, vaginal bleeding and vaginal discharge.  Musculoskeletal: Negative for arthralgias, back pain and myalgias.  Skin: Negative for rash.  Allergic/Immunologic: Negative for environmental allergies and food allergies.  Neurological: Negative for dizziness, weakness, light-headedness, numbness and headaches.  Hematological: Negative for adenopathy. Does not bruise/bleed easily.  Psychiatric/Behavioral: Negative for dysphoric mood. The patient is not nervous/anxious.     Patient Active Problem List   Diagnosis Date Noted  . Acute hypoxemic respiratory failure due to COVID-19 ([place]) 09/19/2019  . Stage 3 chronic kidney disease ([place]) 09/02/2019  . Diabetes mellitus with no complication ([place]) 33/82/5053  . Familial multiple lipoprotein-type hyperlipidemia 01/03/2015  . Creatinine elevation 01/03/2015  . Diabetes ([place]) 12/15/2014  . Hyperlipidemia 12/15/2014  . Hypertension 12/15/2014  . Gastro-esophageal reflux disease without esophagitis 12/15/2014    No Known  Allergies  Past Surgical History:  Procedure Laterality Date  . bladder tack    .  BREAST BIOPSY Left 10-15 yrs ago   benign  . CATARACT EXTRACTION Bilateral 05/2015  . CHOLECYSTECTOMY OPEN    . VAGINAL HYSTERECTOMY      Social History   Tobacco Use  . Smoking status: Former Smoker    Packs/day: 0.25    Years: 25.00    Pack years: 6.25    Types: Cigarettes    Quit date: 1982    Years since quitting: 39.8  . Smokeless tobacco: Never Used  . Tobacco comment: smoking cessation materials not required  Vaping Use  . Vaping Use: Never used  Substance Use Topics  . Alcohol use: No    Alcohol/week: 0.0 standard drinks  . Drug use: No     Medication list has been reviewed and updated.  Current Meds  Medication Sig  . Acetaminophen (TYLENOL ARTHRITIS PAIN PO) Take 2 tablets by mouth daily as needed.   Marland Kitchen ADVOCATE LANCETS MISC use once daily as directed to test blood sugar  . Blood Glucose Monitoring Suppl (FREESTYLE LITE) DEVI use once daily as directed to test blood sugar  . Cholecalciferol (VITAMIN D) 2000 units CAPS Take 1 capsule by mouth daily. otc  . Elderberry 575 MG/5ML SYRP Take 1 Dose by mouth daily.   Marland Kitchen glucose blood (FREESTYLE LITE) test strip USE  STRIP TO CHECK GLUCOSE ONCE DAILY AS DIRECTED  . Lancet Devices (ADVOCATE LANCING DEVICE) MISC use once daily as directed to test blood sugar  . lansoprazole (PREVACID) 15 MG capsule Take 1 capsule (15 mg total) by mouth 1 day or 1 dose.  Marland Kitchen lisinopril-hydrochlorothiazide (ZESTORETIC) 20-12.5 MG tablet Take 1 tablet by mouth daily. Dr Holley Raring  . LORazepam (ATIVAN) 0.5 MG tablet Take 1 tablet (0.5 mg total) by mouth as needed.  . Omega-3 Fatty Acids (FISH OIL) 1000 MG CAPS Take 1 capsule (1,000 mg total) by mouth daily.  . sertraline (ZOLOFT) 25 MG tablet Take 1 tablet (25 mg total) by mouth daily.  . simvastatin (ZOCOR) 20 MG tablet Take 1 tablet (20 mg total) by mouth daily at 6 PM.  . sitaGLIPtin (JANUVIA) 25 MG tablet Take 1 tablet (25 mg total) by mouth daily.  . [DISCONTINUED] cephALEXin (KEFLEX) 500 MG  capsule Take 1 capsule (500 mg total) by mouth 3 (three) times daily.    PHQ 2/9 Scores 06/25/2020 06/17/2020 02/23/2020 02/14/2020  PHQ - 2 Score 0 0 0 0  PHQ- 9 Score 0 0 0 -    GAD 7 : Generalized Anxiety Score 06/25/2020 06/17/2020 02/23/2020 01/01/2020  Nervous, Anxious, on Edge 0 0 0 0  Control/stop worrying 0 0 1 0  Worry too much - different things 0 0 1 0  Trouble relaxing 0 0 0 0  Restless 0 0 0 0  Easily annoyed or irritable 0 0 0 0  Afraid - awful might happen 0 0 0 0  Total GAD 7 Score 0 0 2 0  Anxiety Difficulty - - Not difficult at all -    BP Readings from Last 3 Encounters:  06/25/20 130/80  06/17/20 130/80  02/23/20 130/80    Physical Exam Vitals and nursing note reviewed.  Constitutional:      General: She is not in acute distress.    Appearance: She is not diaphoretic.  HENT:     Head: Normocephalic and atraumatic.     Right Ear: Tympanic membrane, ear canal and external  ear normal. There is no impacted cerumen.     Left Ear: Tympanic membrane, ear canal and external ear normal. There is no impacted cerumen.     Nose: Nose normal. No congestion or rhinorrhea.  Eyes:     General:        Right eye: No discharge.        Left eye: No discharge.     Conjunctiva/sclera: Conjunctivae normal.     Pupils: Pupils are equal, round, and reactive to light.  Neck:     Thyroid: No thyromegaly.     Vascular: No JVD.  Cardiovascular:     Rate and Rhythm: Normal rate and regular rhythm.     Heart sounds: Normal heart sounds. No murmur heard.  No friction rub. No gallop.   Pulmonary:     Effort: Pulmonary effort is normal.     Breath sounds: Normal breath sounds. No wheezing, rhonchi or rales.  Abdominal:     General: Bowel sounds are normal.     Palpations: Abdomen is soft. There is no mass.     Tenderness: There is no abdominal tenderness. There is no guarding.  Musculoskeletal:        General: Normal range of motion.     Cervical back: Normal range of motion and  neck supple.  Lymphadenopathy:     Cervical: No cervical adenopathy.  Skin:    General: Skin is warm and dry.     Capillary Refill: Capillary refill takes less than 2 seconds.  Neurological:     General: No focal deficit present.     Mental Status: She is alert.     Deep Tendon Reflexes: Reflexes are normal and symmetric.     Wt Readings from Last 3 Encounters:  06/25/20 139 lb (63 kg)  06/17/20 140 lb (63.5 kg)  02/23/20 138 lb (62.6 kg)    BP 130/80   Pulse 80   Ht 5\' 5"  (1.651 m)   Wt 139 lb (63 kg)   BMI 23.13 kg/m   Assessment and Plan: 1. Diabetes mellitus with no complication (HCC) Chronic.  Controlled.  Stable.  Patient with recent decline in her GFR has necessitated the discontinuance of Metformin and the reduction of Januvia.  We will check an A1c microalbuminuria at this time.  We will also recheck renal function and note if GFR has declined any more so. - HgB A1c - Microalbumin, urine - sitaGLIPtin (JANUVIA) 25 MG tablet; Take 1 tablet (25 mg total) by mouth daily.  Dispense: 30 tablet; Refill: 1 - Renal Function Panel

## 2020-06-26 ENCOUNTER — Telehealth: Payer: Self-pay | Admitting: Pharmacist

## 2020-06-26 LAB — RENAL FUNCTION PANEL
Albumin: 4.7 g/dL — ABNORMAL HIGH (ref 3.6–4.6)
BUN/Creatinine Ratio: 16 (ref 12–28)
BUN: 30 mg/dL — ABNORMAL HIGH (ref 8–27)
CO2: 23 mmol/L (ref 20–29)
Calcium: 10.5 mg/dL — ABNORMAL HIGH (ref 8.7–10.3)
Chloride: 105 mmol/L (ref 96–106)
Creatinine, Ser: 1.9 mg/dL — ABNORMAL HIGH (ref 0.57–1.00)
GFR calc Af Amer: 27 mL/min/{1.73_m2} — ABNORMAL LOW (ref >59)
GFR calc non Af Amer: 23 mL/min/{1.73_m2} — ABNORMAL LOW (ref >59)
Glucose: 128 mg/dL — ABNORMAL HIGH (ref 65–99)
Phosphorus: 4.5 mg/dL — ABNORMAL HIGH (ref 3.0–4.3)
Potassium: 5.5 mmol/L — ABNORMAL HIGH (ref 3.5–5.2)
Sodium: 143 mmol/L (ref 134–144)

## 2020-06-26 LAB — HEMOGLOBIN A1C
Est. average glucose Bld gHb Est-mCnc: 143 mg/dL
Hgb A1c MFr Bld: 6.6 % — ABNORMAL HIGH (ref 4.8–5.6)

## 2020-06-26 LAB — MICROALBUMIN, URINE: Microalbumin, Urine: 4.3 ug/mL

## 2020-06-26 NOTE — Chronic Care Management (AMB) (Signed)
Chronic Care Management Pharmacy Assistant   Name: Jordan Thornton  MRN: [id number] DOB: May 03, 1930  Reason for Encounter: Patient Assistance Coordination/Follow up  Spoke with patient regarding patient assistance application for Januvia. Patient states she has not received application to complete as of today 06/26/2020.  PCP : Jordan Patch, MD  Allergies:  No Known Allergies  Medications: Outpatient Encounter Medications as of 06/26/2020  Medication Sig Note  . Acetaminophen (TYLENOL ARTHRITIS PAIN PO) Take 2 tablets by mouth daily as needed.    Marland Kitchen ADVOCATE LANCETS MISC use once daily as directed to test blood sugar   . Blood Glucose Monitoring Suppl (FREESTYLE LITE) DEVI use once daily as directed to test blood sugar   . Cholecalciferol (VITAMIN D) 2000 units CAPS Take 1 capsule by mouth daily. otc   . Elderberry 575 MG/5ML SYRP Take 1 Dose by mouth daily.    Marland Kitchen glucose blood (FREESTYLE LITE) test strip USE  STRIP TO CHECK GLUCOSE ONCE DAILY AS DIRECTED   . Lancet Devices (ADVOCATE LANCING DEVICE) MISC use once daily as directed to test blood sugar   . lansoprazole (PREVACID) 15 MG capsule Take 1 capsule (15 mg total) by mouth 1 day or 1 dose.   Marland Kitchen lisinopril-hydrochlorothiazide (ZESTORETIC) 20-12.5 MG tablet Take 1 tablet by mouth daily. Dr Holley Raring   . LORazepam (ATIVAN) 0.5 MG tablet Take 1 tablet (0.5 mg total) by mouth as needed.   . Omega-3 Fatty Acids (FISH OIL) 1000 MG CAPS Take 1 capsule (1,000 mg total) by mouth daily.   . sertraline (ZOLOFT) 25 MG tablet Take 1 tablet (25 mg total) by mouth daily. 06/11/2020: Has been taking prn  . simvastatin (ZOCOR) 20 MG tablet Take 1 tablet (20 mg total) by mouth daily at 6 PM.   . sitaGLIPtin (JANUVIA) 25 MG tablet Take 1 tablet (25 mg total) by mouth daily.    No facility-administered encounter medications on file as of 06/26/2020.    Current Diagnosis: Patient Active Problem List   Diagnosis Date Noted  . Acute  hypoxemic respiratory failure due to COVID-19 ([place]) 09/19/2019  . Stage 3 chronic kidney disease ([place]) 09/02/2019  . Diabetes mellitus with no complication ([place]) 73/22/0254  . Familial multiple lipoprotein-type hyperlipidemia 01/03/2015  . Creatinine elevation 01/03/2015  . Diabetes ([place]) 12/15/2014  . Hyperlipidemia 12/15/2014  . Hypertension 12/15/2014  . Gastro-esophageal reflux disease without esophagitis 12/15/2014    06/26/20-Julie Kenton Kingfisher, CPP notified that CPA will resend application for Januvia through [employer] today 06/26/2020 for patient to sign and send back to Dr.Jones (PCP) for completion. Informed patient to call once she receives application in the mail however CPA will follow-up with [name] at the end of week.   07/11/20-CPA spoke with [name]. Per representative; [employer] has not received patient application as of yet. Per [name]; she mailed application through [employer] on 07/04/20. [employer] states it will take about 2 weeks until received in mail. Once received they will mail out an additional attestation form for patient to complete and mail back to [employer] Patient Assistance. Per [employer] rep; overall this is a month processing stage.   07/12/20: Attempted to contact patient to explain [employer] will be mailing her a form to complete related to patient assistance. Patient will need to complete form, sign and take back to PCP office. No answer. Left HIPAA compliant voicemail requesting a call back. Will attempt to follow up next week.         Raynelle Highland,  [place] [phone]  Follow-Up:  F/U with patient first week of November 2021.

## 2020-06-27 ENCOUNTER — Telehealth: Payer: Self-pay

## 2020-06-28 DIAGNOSIS — N1832 Chronic kidney disease, stage 3b: Secondary | ICD-10-CM | POA: Diagnosis not present

## 2020-07-11 ENCOUNTER — Ambulatory Visit: Payer: Self-pay | Admitting: Pharmacist

## 2020-07-11 DIAGNOSIS — I10 Essential (primary) hypertension: Secondary | ICD-10-CM

## 2020-07-11 DIAGNOSIS — E119 Type 2 diabetes mellitus without complications: Secondary | ICD-10-CM

## 2020-07-11 DIAGNOSIS — N1832 Chronic kidney disease, stage 3b: Secondary | ICD-10-CM

## 2020-07-11 NOTE — Patient Instructions (Addendum)
Visit Information  It was a pleasure speaking with you today. Thank you for letting me be part of your clinical team. Please call with any questions or concerns.   Goals Addressed            This Visit's Progress   . Pharmacy Care Plan       CARE PLAN ENTRY (see longitudinal plan of care for additional care plan information)  Current Barriers:  . Chronic Disease Management support, education, and care coordination needs related to Hypertension, Hyperlipidemia, Diabetes, GERD, and Chronic Kidney Disease   Hypertension BP Readings from Last 3 Encounters:  06/25/20 130/80  06/17/20 130/80  02/23/20 130/80   . Pharmacist Clinical Goal(s): o Over the next 90 days, patient will work with PharmD and providers to maintain BP goal <130/80 . Current regimen:  o Lisinopril/hctz 20/12.5mg  daily . Interventions: o Provided diet and exercise counseling. o Encouraged patient to continue checking and documenting at home 2-3 times weekly. Advised patient to bring cuff with her to next appt for comparison. . Patient self care activities - Over the next 60 days, patient will: o Check BP 2-3 times weekly, document, and provide at future appointments o Ensure daily salt intake < 2300 mg/day o Continue following with Nephrology  Hyperlipidemia Lab Results  Component Value Date/Time   LDLCALC 117 (H) 11/17/2019 10:01 AM   . Pharmacist Clinical Goal(s): o Over the next 60 days, patient will work with PharmD and providers to achieve LDL goal < 70 . Current regimen:  o Simvastatin 20 mg qpm . Interventions: o Provided diet and exercise counseling. . Patient self care activities - Over the next 60 days, patient will: o Continue eating a healthy diet and maintain activity level o Attend all scheduled appointments   Diabetes Lab Results  Component Value Date/Time   HGBA1C 6.6 (H) 06/25/2020 10:55 AM   HGBA1C 6.6 (H) 02/23/2020 11:05 AM   . Pharmacist Clinical Goal(s): o Over the next 60  days, patient will work with PharmD and providers to maintain A1c goal <7% . Current regimen:  o Januvia 25 mg qd  (begin Tradjenta 5 mg samples after depletion of current supply) . Interventions: o Called [employer] to assess status of PAP application and ask for 25 mg Januvia samples or emergency voucher. Neither available. o Collaborated with PCP tp provide Tradjenta 5 mg samples while awaiting PAP approval. o Provided diet and exercise counseling. . Patient self care activities - Over the next 60 days, patient will: o Check blood sugar once daily, document, and provide at future appointments o Contact provider with any episodes of hypoglycemia o Contact PharmD or PCP with questions or concerns o Complete and return [employer] attestation form when received and notify PharmD  Medication management . Pharmacist Clinical Goal(s): o Over the next 60 days, patient will work with PharmD and providers to achieve optimal medication adherence . Current pharmacy: [facility] . Interventions o Comprehensive medication review performed. o Continue current medication management strategy . Patient self care activities - Over the next 60 days, patient will: o Focus on medication adherence by fill dates o Take medications as prescribed o Report any questions or concerns to PharmD and/or provider(s)  Initial goal documentation        The patient verbalized understanding of instructions provided today and agreed to receive a mailed copy of patient instruction and/or educational materials.  Telephone follow up appointment with pharmacy team member scheduled for: 2 months  Junita Push. [place] PharmD, BCPS Clinical  Pharmacist [phone] Linagliptin oral tablets What is this medicine? Linagliptin (lin a GLIP tin) helps to treat type 2 diabetes. It helps to control blood sugar. Treatment is combined with diet and exercise. This medicine may be used for other purposes; ask your health care provider or  pharmacist if you have questions. COMMON BRAND NAME(S): Tradjenta What should I tell my health care provider before I take this medicine? They need to know if you have any of these conditions:  diabetic ketoacidosis  type 1 diabetes  an unusual or allergic reaction to linagliptin, other medicines, foods, dyes, or preservatives  pregnant or trying to get pregnant  breast-feeding How should I use this medicine? Take this medicine by mouth with a glass of water. Follow the directions on the prescription label. You can take it with or without food. Take your dose at the same time each day. Do not take more often than directed. Do not stop taking except on your doctor's advice. A special MedGuide will be given to you by the pharmacist with each prescription and refill. Be sure to read this information carefully each time. Talk to your pediatrician regarding the use of this medicine in children. Special care may be needed. Overdosage: If you think you have taken too much of this medicine contact a poison control center or emergency room at once. NOTE: This medicine is only for you. Do not share this medicine with others. What if I miss a dose? If you miss a dose, take it as soon as you can. If it is almost time for your next dose, take only that dose. Do not take double or extra doses. What may interact with this medicine?  alcohol  bosentan  certain medicines for seizures like carbamazepine, phenobarbital, phenytoin  rifabutin  rifampin  St. John's Wort  sulfonylureas like glimepiride, glipizide, glyburide This list may not describe all possible interactions. Give your health care provider a list of all the medicines, herbs, non-prescription drugs, or dietary supplements you use. Also tell them if you smoke, drink alcohol, or use illegal drugs. Some items may interact with your medicine. What should I watch for while using this medicine? Visit your doctor or health care professional  for regular checks on your progress. A test called the HbA1C (A1C) will be monitored. This is a simple blood test. It measures your blood sugar control over the last 2 to 3 months. You will receive this test every 3 to 6 months. Learn how to check your blood sugar. Learn the symptoms of low and high blood sugar and how to manage them. Always carry a quick-source of sugar with you in case you have symptoms of low blood sugar. Examples include hard sugar candy or glucose tablets. Make sure others know that you can choke if you eat or drink when you develop serious symptoms of low blood sugar, such as seizures or unconsciousness. They must get medical help at once. Tell your doctor or health care professional if you have high blood sugar. You might need to change the dose of your medicine. If you are sick or exercising more than usual, you might need to change the dose of your medicine. Do not skip meals. Ask your doctor or health care professional if you should avoid alcohol. Many nonprescription cough and cold products contain sugar or alcohol. These can affect blood sugar. Wear a medical ID bracelet or chain, and carry a card that describes your disease and details of your medicine and dosage times. What  side effects may I notice from receiving this medicine? Side effects that you should report to your doctor or health care professional as soon as possible:  allergic reactions like skin rash, itching or hives, swelling of the face, lips, or tongue  breathing problems  fever, chills  joint pain  nausea  redness, blistering, peeling or loosening of the skin, including inside the mouth  signs and symptoms of low blood sugar such as feeling anxious, confusion, dizziness, increased hunger, unusually weak or tired, sweating, shakiness, cold, irritable, headache, blurred vision, fast heartbeat, loss of consciousness  unusual stomach pain or discomfort  vomiting Side effects that usually do not  require medical attention (report to your doctor or health care professional if they continue or are bothersome):  headache  sore throat  stuffy or runny nose This list may not describe all possible side effects. Call your doctor for medical advice about side effects. You may report side effects to FDA at 1-800-FDA-1088. Where should I keep my medicine? Keep out of the reach of children. Store at room temperature between 15 and 30 degrees C (59 and 86 degrees F). Throw away any unused medicine after the expiration date. NOTE: This sheet is a summary. It may not cover all possible information. If you have questions about this medicine, talk to your doctor, pharmacist, or health care provider.  2020 Elsevier/Gold Standard (2017-10-12 17:18:37) Linagliptin oral tablets What is this medicine? Linagliptin (lin a GLIP tin) helps to treat type 2 diabetes. It helps to control blood sugar. Treatment is combined with diet and exercise. This medicine may be used for other purposes; ask your health care provider or pharmacist if you have questions. COMMON BRAND NAME(S): Tradjenta What should I tell my health care provider before I take this medicine? They need to know if you have any of these conditions:  diabetic ketoacidosis  type 1 diabetes  an unusual or allergic reaction to linagliptin, other medicines, foods, dyes, or preservatives  pregnant or trying to get pregnant  breast-feeding How should I use this medicine? Take this medicine by mouth with a glass of water. Follow the directions on the prescription label. You can take it with or without food. Take your dose at the same time each day. Do not take more often than directed. Do not stop taking except on your doctor's advice. A special MedGuide will be given to you by the pharmacist with each prescription and refill. Be sure to read this information carefully each time. Talk to your pediatrician regarding the use of this medicine in  children. Special care may be needed. Overdosage: If you think you have taken too much of this medicine contact a poison control center or emergency room at once. NOTE: This medicine is only for you. Do not share this medicine with others. What if I miss a dose? If you miss a dose, take it as soon as you can. If it is almost time for your next dose, take only that dose. Do not take double or extra doses. What may interact with this medicine?  alcohol  bosentan  certain medicines for seizures like carbamazepine, phenobarbital, phenytoin  rifabutin  rifampin  St. John's Wort  sulfonylureas like glimepiride, glipizide, glyburide This list may not describe all possible interactions. Give your health care provider a list of all the medicines, herbs, non-prescription drugs, or dietary supplements you use. Also tell them if you smoke, drink alcohol, or use illegal drugs. Some items may interact with your medicine. What  should I watch for while using this medicine? Visit your doctor or health care professional for regular checks on your progress. A test called the HbA1C (A1C) will be monitored. This is a simple blood test. It measures your blood sugar control over the last 2 to 3 months. You will receive this test every 3 to 6 months. Learn how to check your blood sugar. Learn the symptoms of low and high blood sugar and how to manage them. Always carry a quick-source of sugar with you in case you have symptoms of low blood sugar. Examples include hard sugar candy or glucose tablets. Make sure others know that you can choke if you eat or drink when you develop serious symptoms of low blood sugar, such as seizures or unconsciousness. They must get medical help at once. Tell your doctor or health care professional if you have high blood sugar. You might need to change the dose of your medicine. If you are sick or exercising more than usual, you might need to change the dose of your medicine. Do not  skip meals. Ask your doctor or health care professional if you should avoid alcohol. Many nonprescription cough and cold products contain sugar or alcohol. These can affect blood sugar. Wear a medical ID bracelet or chain, and carry a card that describes your disease and details of your medicine and dosage times. What side effects may I notice from receiving this medicine? Side effects that you should report to your doctor or health care professional as soon as possible:  allergic reactions like skin rash, itching or hives, swelling of the face, lips, or tongue  breathing problems  fever, chills  joint pain  nausea  redness, blistering, peeling or loosening of the skin, including inside the mouth  signs and symptoms of low blood sugar such as feeling anxious, confusion, dizziness, increased hunger, unusually weak or tired, sweating, shakiness, cold, irritable, headache, blurred vision, fast heartbeat, loss of consciousness  unusual stomach pain or discomfort  vomiting Side effects that usually do not require medical attention (report to your doctor or health care professional if they continue or are bothersome):  headache  sore throat  stuffy or runny nose This list may not describe all possible side effects. Call your doctor for medical advice about side effects. You may report side effects to FDA at 1-800-FDA-1088. Where should I keep my medicine? Keep out of the reach of children. Store at room temperature between 15 and 30 degrees C (59 and 86 degrees F). Throw away any unused medicine after the expiration date. NOTE: This sheet is a summary. It may not cover all possible information. If you have questions about this medicine, talk to your doctor, pharmacist, or health care provider.  2020 Elsevier/Gold Standard (2017-10-12 17:18:37)

## 2020-07-11 NOTE — Chronic Care Management (AMB) (Signed)
Chronic Care Management Pharmacy  Name: Jordan Thornton  MRN: [id number] DOB: 1930/01/04   Chief Complaint/ HPI  Jordan McCoy Narayanan,  86 y.o. , female presents for their Initial CCM visit with the clinical pharmacist via telephone.  PCP : Juline Patch, MD Patient Care Team: Juline Patch, MD as PCP - General (Family Medicine) Anthonette Legato, MD as Consulting Physician (Internal Medicine) Vladimir Faster, [facility] (Pharmacist)  Their chronic conditions include: Hypertension, Hyperlipidemia, Diabetes, GERD and Chronic Kidney Disease COVID 19 infection January 2021  Office Visits: 06/25/20- Dr. Ronnald Ramp - labs Scr 1.9, A1c 6.6, k and phos elevatedDM follow up , no med changes 02/23/20-Dr. Ronnald Ramp - Sertraline $RemoveBefo'25mg'[id number]$  qd, decrease lorazepam #20/30day. A1c   Consult Visit: 06/28/20- labs for nephrololgy SCR 1.71, K 4.6 no phos 05/17/20 - Dr. Holley Raring, Nephrology - eGFR decreased to 52ml/min, repeat lab Scr 1.92 eGFr 26  No Known Allergies  Medications: Outpatient Encounter Medications as of 07/11/2020  Medication Sig Note  . Acetaminophen (TYLENOL ARTHRITIS PAIN PO) Take 2 tablets by mouth daily as needed.    Marland Kitchen ADVOCATE LANCETS MISC use once daily as directed to test blood sugar   . Blood Glucose Monitoring Suppl (FREESTYLE LITE) DEVI use once daily as directed to test blood sugar   . Cholecalciferol (VITAMIN D) 2000 units CAPS Take 1 capsule by mouth daily. otc   . Elderberry 575 MG/5ML SYRP Take 1 Dose by mouth daily.    Marland Kitchen glucose blood (FREESTYLE LITE) test strip USE  STRIP TO CHECK GLUCOSE ONCE DAILY AS DIRECTED   . Lancet Devices (ADVOCATE LANCING DEVICE) MISC use once daily as directed to test blood sugar   . lansoprazole (PREVACID) 15 MG capsule Take 1 capsule (15 mg total) by mouth 1 day or 1 dose.   Marland Kitchen lisinopril-hydrochlorothiazide (ZESTORETIC) 20-12.5 MG tablet Take 1 tablet by mouth daily. Dr Holley Raring   . LORazepam (ATIVAN) 0.5 MG tablet Take 1 tablet (0.5 mg total) by  mouth as needed.   . Omega-3 Fatty Acids (FISH OIL) 1000 MG CAPS Take 1 capsule (1,000 mg total) by mouth daily.   . sertraline (ZOLOFT) 25 MG tablet Take 1 tablet (25 mg total) by mouth daily. 06/11/2020: Has been taking prn  . simvastatin (ZOCOR) 20 MG tablet Take 1 tablet (20 mg total) by mouth daily at 6 PM.   . sitaGLIPtin (JANUVIA) 25 MG tablet Take 1 tablet (25 mg total) by mouth daily.    No facility-administered encounter medications on file as of 07/11/2020.    Wt Readings from Last 3 Encounters:  06/25/20 139 lb (63 kg)  06/17/20 140 lb (63.5 kg)  02/23/20 138 lb (62.6 kg)      SDOH Interventions     Most Recent Value  SDOH Interventions  Financial Strain Interventions Other (Comment)  [patient assistance for copaya]      Goals Addressed            This Visit's Progress   . Pharmacy Care Plan       CARE PLAN ENTRY (see longitudinal plan of care for additional care plan information)  Current Barriers:  . Chronic Disease Management support, education, and care coordination needs related to Hypertension, Hyperlipidemia, Diabetes, GERD, and Chronic Kidney Disease   Hypertension BP Readings from Last 3 Encounters:  06/25/20 130/80  06/17/20 130/80  02/23/20 130/80   . Pharmacist Clinical Goal(s): o Over the next 90 days, patient will work with PharmD and providers to maintain BP goal <130/80 .  Current regimen:  o Lisinopril/hctz 20/12.5mg  daily . Interventions: o Provided diet and exercise counseling. o Encouraged patient to continue checking and documenting at home 2-3 times weekly. Advised patient to bring cuff with her to next appt for comparison. . Patient self care activities - Over the next 60 days, patient will: o Check BP 2-3 times weekly, document, and provide at future appointments o Ensure daily salt intake < 2300 mg/day o Continue following with Nephrology  Hyperlipidemia Lab Results  Component Value Date/Time   LDLCALC 117 (H) 11/17/2019  10:01 AM   . Pharmacist Clinical Goal(s): o Over the next 60 days, patient will work with PharmD and providers to achieve LDL goal < 70 . Current regimen:  o Simvastatin 20 mg qpm . Interventions: o Provided diet and exercise counseling. . Patient self care activities - Over the next 60 days, patient will: o Continue eating a healthy diet and maintain activity level o Attend all scheduled appointments   Diabetes Lab Results  Component Value Date/Time   HGBA1C 6.6 (H) 06/25/2020 10:55 AM   HGBA1C 6.6 (H) 02/23/2020 11:05 AM   . Pharmacist Clinical Goal(s): o Over the next 60 days, patient will work with PharmD and providers to maintain A1c goal <7% . Current regimen:  o Januvia 25 mg qd  (begin Tradjenta 5 mg samples after depletion of current supply) . Interventions: o Called [employer] to assess status of PAP application and ask for 25 mg Januvia samples or emergency voucher. Neither available. o Collaborated with PCP tp provide Tradjenta 5 mg samples while awaiting PAP approval. o Provided diet and exercise counseling. . Patient self care activities - Over the next 60 days, patient will: o Check blood sugar once daily, document, and provide at future appointments o Contact provider with any episodes of hypoglycemia o Contact PharmD or PCP with questions or concerns o Complete and return [employer] attestation form when received and notify PharmD  Medication management . Pharmacist Clinical Goal(s): o Over the next 60 days, patient will work with PharmD and providers to achieve optimal medication adherence . Current pharmacy: [facility] . Interventions o Comprehensive medication review performed. o Continue current medication management strategy . Patient self care activities - Over the next 60 days, patient will: o Focus on medication adherence by fill dates o Take medications as prescribed o Report any questions or concerns to PharmD and/or provider(s)  Initial goal  documentation        Diabetes   A1c goal <8%  Recent Relevant Labs: Lab Results  Component Value Date/Time   HGBA1C 6.6 (H) 06/25/2020 10:55 AM   HGBA1C 6.6 (H) 02/23/2020 11:05 AM    Last diabetic Eye exam:  Lab Results  Component Value Date/Time   HMDIABEYEEXA No Retinopathy 07/19/2018 12:00 AM    Last diabetic Foot exam: No results found for: HMDIABFOOTEX   Checking BG: Daily  Recent FBG Readings:97-110  Patient has failed these meds in past: glipizide (caused overnight hypoglycemia) Patient is currently controlled on the following medications: . Januvia 25 mg daily  We discussed: Metfomin discontinued by Dr. Holley Raring and Januvia dose decreased to 25 mg daily due to decreased renal function. Mrs. Oravec reports she has stopped drinking juice and switiched to water with one serving of an aloe vera beverage in the evenings. PAP application mailed to [employer] on 07/04/20. Per CMA, Raynelle Highland, phone follow-up today they have not received. [employer] representative advised 2 week allowance for receipt and processing and then attestation form will be mailed  to patient. Process can take 4- 6 weeks to complete. [employer] Januvia samples are only available in 100 mg and they do not supply emergency fill vouchers. Mrs. Yearsley has 3 tablets remaining. I have collaborated with Dr. Ronnald Ramp to provide Tradjenta 5 mg x 4 weeks samples today.   Plan Continue current medication. Mrs Hennings picked up Tradjenta 5 mg samples today and will begin after Januvia supply depleted. Will follow up with PAP next week.   Hyperlipidemia   LDL goal < 100  Lipid Panel     Component Value Date/Time   CHOL 193 11/17/2019 1001   TRIG 116 11/17/2019 1001   HDL 55 11/17/2019 1001   LDLCALC 117 (H) 11/17/2019 1001    Hepatic Function Latest Ref Rng & Units 06/25/2020 10/17/2019 09/21/2019  Total Protein 6.5 - 8.1 g/dL - - 5.9(L)  Albumin 3.6 - 4.6 g/dL 4.7(H) 3.7 2.8(L)  AST 15 - 41 U/L - - 30  ALT 0 - 44  U/L - - 19  Alk Phosphatase 38 - 126 U/L - - 35(L)  Total Bilirubin 0.3 - 1.2 mg/dL - - 0.4  Bilirubin, Direct 0.00 - 0.40 mg/dL - - -     The ASCVD Risk score Mikey Bussing DC Jr., et al., 2013) failed to calculate for the following reasons:   The 2013 ASCVD risk score is only valid for ages 76 to 76   Patient has failed these meds in past: Unavailable in chart Patient is currently uncontrolled on the following medications:  . Simvastatin 20 mg q 6pm  We discussed:  diet and exercise extensively. Patient does not have a structured exercise regimen but is very active cleaning house, cooking meals and still plays the organ at church. Will reassess after next lipid panel.  Plan   Continue current medications   Hypertension/CKDIIIB   BP goal is:  <130/80  Office blood pressures are  BP Readings from Last 3 Encounters:  06/25/20 130/80  06/17/20 130/80  02/23/20 130/80   BMP Latest Ref Rng & Units 06/25/2020 10/17/2019 10/09/2019  Glucose 65 - 99 mg/dL 128(H) 115(H) 108(H)  BUN 8 - 27 mg/dL 30(H) 19 23  Creatinine 0.57 - 1.00 mg/dL 1.90(H) 1.66(H) 1.81(H)  BUN/Creat Ratio 12 - 28 16 11(L) 13  Sodium 134 - 144 mmol/L 143 147(H) 145(H)  Potassium 3.5 - 5.2 mmol/L 5.5(H) 4.9 5.0  Chloride 96 - 106 mmol/L 105 108(H) 107(H)  CO2 20 - 29 mmol/L _0 Calcium 8.7 - 10.3 mg/dL 10.5(H) 8.7 9.2  Labs drawn for nephrololgy 06/28/20 Scr 1.71, k 4.6 , Ca 10.0  Patient checks BP at home infrequently  Home BP readings 150-155/90  Patient has failed these meds in the past: N/A Patient is currently controlled on the following medications:  . Lisinopril/hctz 20/12.42m daily  We discussed: Patient has a blood pressure cuff at home but has quit checking stating her BP were always elevated at home while controlled at office visits. We discussed masked hypertension and I advised her to bring in her electronic cuff to next PCP appointment to compare readings.  Plan  Continue current medications and  follow up with nephrology.      Will discuss GERD and anxiety at next appointment.    Medication Management   Pt uses [facility] pharmacy for all medications Pt endorses 99% compliance  We discussed: Discussed benefits of medication synchronization, packaging and delivery as well as enhanced pharmacist oversight with [employer]. She is currently happy with [facility] but glad  to know this is an option.  Plan  Continue current medication management strategy    Follow up: 2  month phone visit  Junita Push. Kenton Kingfisher PharmD, [facility] [phone]

## 2020-07-22 ENCOUNTER — Telehealth: Payer: Self-pay | Admitting: Pharmacist

## 2020-07-22 NOTE — Chronic Care Management (AMB) (Signed)
    Chronic Care Management Pharmacy Assistant   Name: Jordan Thornton  MRN: [id number] DOB: 07/11/1930  Reason for Encounter: Jordan Thornton Assistance Application Follow up   PCP : Jordan Patch, MD  Allergies:  No Known Allergies  Medications: Outpatient Encounter Medications as of 07/22/2020  Medication Sig Note  . Acetaminophen (TYLENOL ARTHRITIS PAIN PO) Take 2 tablets by mouth daily as needed.    Marland Kitchen ADVOCATE LANCETS MISC use once daily as directed to test blood sugar   . Blood Glucose Monitoring Suppl (FREESTYLE LITE) DEVI use once daily as directed to test blood sugar   . Cholecalciferol (VITAMIN D) 2000 units CAPS Take 1 capsule by mouth daily. otc   . Elderberry 575 MG/5ML SYRP Take 1 Dose by mouth daily.    Marland Kitchen glucose blood (FREESTYLE LITE) test strip USE  STRIP TO CHECK GLUCOSE ONCE DAILY AS DIRECTED   . Lancet Devices (ADVOCATE LANCING DEVICE) MISC use once daily as directed to test blood sugar   . lansoprazole (PREVACID) 15 MG capsule Take 1 capsule (15 mg total) by mouth 1 day or 1 dose.   Marland Kitchen lisinopril-hydrochlorothiazide (ZESTORETIC) 20-12.5 MG tablet Take 1 tablet by mouth daily. Dr Jordan Thornton   . LORazepam (ATIVAN) 0.5 MG tablet Take 1 tablet (0.5 mg total) by mouth as needed.   . Omega-3 Fatty Acids (FISH OIL) 1000 MG CAPS Take 1 capsule (1,000 mg total) by mouth daily.   . sertraline (ZOLOFT) 25 MG tablet Take 1 tablet (25 mg total) by mouth daily. 06/11/2020: Has been taking prn  . simvastatin (ZOCOR) 20 MG tablet Take 1 tablet (20 mg total) by mouth daily at 6 PM.   . sitaGLIPtin (JANUVIA) 25 MG tablet Take 1 tablet (25 mg total) by mouth daily.    No facility-administered encounter medications on file as of 07/22/2020.    Current Diagnosis: Patient Active Problem List   Diagnosis Date Noted  . Acute hypoxemic respiratory failure due to COVID-19 ([place]) 09/19/2019  . Stage 3 chronic kidney disease ([place]) 09/02/2019  . Diabetes mellitus with no complication ([place])  76/28/3151  . Familial multiple lipoprotein-type hyperlipidemia 01/03/2015  . Creatinine elevation 01/03/2015  . Diabetes ([place]) 12/15/2014  . Hyperlipidemia 12/15/2014  . Hypertension 12/15/2014  . Gastro-esophageal reflux disease without esophagitis 12/15/2014    Goals Addressed   None    07/25/20- CPA called and spoke with Jordan Thornton Patient Assistance Representative to follow up on [name]'s patient assistance application. Jordan representative voiced Jordan patient application was incomplete due to missing prescriber's state license number. CPA was able to provide that information to representative through phone call. Per Jordan Thornton; Jordan patient was mailed an Attestation Form on 07/11/20. Jordan patient will need to have this completed, signed and mail back for application to continue processing.   CPA spoke with Jordan patient and kindly advised her to complete Jordan attestation form. CPA went over detailed instructions given by Jordan Thornton with Jordan patient on what she needs to be complete on Jordan attestation form and mail back to Jordan Thornton address listed where to send Jordan form once completed. Jordan patient verbalized understanding.   Follow-Up:  CPA to follow up with Jordan patient on 11/15.

## 2020-07-29 ENCOUNTER — Other Ambulatory Visit: Payer: Self-pay | Admitting: Family Medicine

## 2020-07-29 DIAGNOSIS — Z1231 Encounter for screening mammogram for malignant neoplasm of breast: Secondary | ICD-10-CM

## 2020-08-02 ENCOUNTER — Telehealth: Payer: Self-pay | Admitting: Pharmacist

## 2020-08-02 NOTE — Chronic Care Management (AMB) (Signed)
    Chronic Care Management Pharmacy Assistant   Name: Jordan Thornton  MRN: [id number] DOB: Jan 30, 1930  Reason for Encounter: [employer] Assistance Follow-up for Jordan Thornton   PCP : Juline Patch, MD  Allergies:  No Known Allergies  Medications: Outpatient Encounter Medications as of 08/02/2020  Medication Sig Note  . Acetaminophen (TYLENOL ARTHRITIS PAIN PO) Take 2 tablets by mouth daily as needed.    Marland Kitchen ADVOCATE LANCETS MISC use once daily as directed to test blood sugar   . Blood Glucose Monitoring Suppl (FREESTYLE LITE) DEVI use once daily as directed to test blood sugar   . Cholecalciferol (VITAMIN D) 2000 units CAPS Take 1 capsule by mouth daily. otc   . Elderberry 575 MG/5ML SYRP Take 1 Dose by mouth daily.    Marland Kitchen glucose blood (FREESTYLE LITE) test strip USE  STRIP TO CHECK GLUCOSE ONCE DAILY AS DIRECTED   . Lancet Devices (ADVOCATE LANCING DEVICE) MISC use once daily as directed to test blood sugar   . lansoprazole (PREVACID) 15 MG capsule Take 1 capsule (15 mg total) by mouth 1 day or 1 dose.   Marland Kitchen lisinopril-hydrochlorothiazide (ZESTORETIC) 20-12.5 MG tablet Take 1 tablet by mouth daily. Dr Holley Raring   . LORazepam (ATIVAN) 0.5 MG tablet Take 1 tablet (0.5 mg total) by mouth as needed.   . Omega-3 Fatty Acids (FISH OIL) 1000 MG CAPS Take 1 capsule (1,000 mg total) by mouth daily.   . sertraline (ZOLOFT) 25 MG tablet Take 1 tablet (25 mg total) by mouth daily. 06/11/2020: Has been taking prn  . simvastatin (ZOCOR) 20 MG tablet Take 1 tablet (20 mg total) by mouth daily at 6 PM.   . sitaGLIPtin (JANUVIA) 25 MG tablet Take 1 tablet (25 mg total) by mouth daily.    No facility-administered encounter medications on file as of 08/02/2020.    Current Diagnosis: Patient Active Problem List   Diagnosis Date Noted  . Acute hypoxemic respiratory failure due to COVID-19 ([place]) 09/19/2019  . Stage 3 chronic kidney disease ([place]) 09/02/2019  . Diabetes mellitus with no complication ([place])  21/22/4825  . Familial multiple lipoprotein-type hyperlipidemia 01/03/2015  . Creatinine elevation 01/03/2015  . Diabetes ([place]) 12/15/2014  . Hyperlipidemia 12/15/2014  . Hypertension 12/15/2014  . Gastro-esophageal reflux disease without esophagitis 12/15/2014    Goals Addressed   None    08/02/20- CPA spoke with the patient in regards of her initial application for Januvia through [employer] patient assistance. [employer] informed her through mail the patient will need to fill out, complete, and mail back attestation form to [employer]. CPA assisted the patient through this phone call to help as much as possible. The patient was able to complete all information needed on the form and informed CPA she will be mailing documents back to [employer] today 08/02/20 through [place].  CPA communicated with the patient that CPP [name] will be notified of this matter.   Raynelle Highland, [place] Assistant [phone]   Follow-Up:  Patient Assistance Coordination- CPA to follow-up with the patient 08/14/20.

## 2020-08-14 ENCOUNTER — Telehealth: Payer: Self-pay | Admitting: Pharmacist

## 2020-08-14 NOTE — Chronic Care Management (AMB) (Signed)
Chronic Care Management Pharmacy Assistant   Name: Jordan Thornton  MRN: [id number] DOB: November 10, 1929  Reason for Encounter: Follow-up on [employer] Patient Assistance Program Application Status.   PCP : Jordan Patch, MD  Allergies:  No Known Allergies  Medications: Outpatient Encounter Medications as of 08/14/2020  Medication Sig Note  . Acetaminophen (TYLENOL ARTHRITIS PAIN PO) Take 2 tablets by mouth daily as needed.    Marland Kitchen ADVOCATE LANCETS MISC use once daily as directed to test blood sugar   . Blood Glucose Monitoring Suppl (FREESTYLE LITE) DEVI use once daily as directed to test blood sugar   . Cholecalciferol (VITAMIN D) 2000 units CAPS Take 1 capsule by mouth daily. otc   . Elderberry 575 MG/5ML SYRP Take 1 Dose by mouth daily.    Marland Kitchen glucose blood (FREESTYLE LITE) test strip USE  STRIP TO CHECK GLUCOSE ONCE DAILY AS DIRECTED   . Lancet Devices (ADVOCATE LANCING DEVICE) MISC use once daily as directed to test blood sugar   . lansoprazole (PREVACID) 15 MG capsule Take 1 capsule (15 mg total) by mouth 1 day or 1 dose.   Marland Kitchen lisinopril-hydrochlorothiazide (ZESTORETIC) 20-12.5 MG tablet Take 1 tablet by mouth daily. Dr Holley Raring   . LORazepam (ATIVAN) 0.5 MG tablet Take 1 tablet (0.5 mg total) by mouth as needed.   . Omega-3 Fatty Acids (FISH OIL) 1000 MG CAPS Take 1 capsule (1,000 mg total) by mouth daily.   . sertraline (ZOLOFT) 25 MG tablet Take 1 tablet (25 mg total) by mouth daily. 06/11/2020: Has been taking prn  . simvastatin (ZOCOR) 20 MG tablet Take 1 tablet (20 mg total) by mouth daily at 6 PM.   . sitaGLIPtin (JANUVIA) 25 MG tablet Take 1 tablet (25 mg total) by mouth daily.    No facility-administered encounter medications on file as of 08/14/2020.    Current Diagnosis: Patient Active Problem List   Diagnosis Date Noted  . Acute hypoxemic respiratory failure due to COVID-19 ([place]) 09/19/2019  . Stage 3 chronic kidney disease ([place]) 09/02/2019  . Diabetes mellitus with no  complication ([place]) 19/62/2297  . Familial multiple lipoprotein-type hyperlipidemia 01/03/2015  . Creatinine elevation 01/03/2015  . Diabetes ([place]) 12/15/2014  . Hyperlipidemia 12/15/2014  . Hypertension 12/15/2014  . Gastro-esophageal reflux disease without esophagitis 12/15/2014    08/14/2020-Related to Jordan Thornton: Unsuccessful Call attempt to the patient to discuss the attestation form mailed from Jordan Thornton for the patient to fill and mail back. Left a compliant HIPAA voicemail for a call back.  CPA called and spoke with Jordan Thornton Patient Software engineer who explained they did receive the attestation form from the patient however the application is incomplete because the [facility] License number was not provided. Once [employer] does receive this information the application will be complete and ready for processing. CPA will contact PCP for this urgent information needed to complete this initial process.   08/14/20 at 12:11pm: Incoming Call- The patient returned phone call to CPA confirming she did mail back attestation form on 08/02/20. Patient communicated with CPA she is running low on her Tradjenta samples and only have 4 Tablets remaining until patient assistance application for Januvia goes through. CPA emphasized to the patient will need to contact PCP's office for urgent request. CPA messaged PTM [name] for assistance with this matter. Per [name]; call was attempted but no answer. Will continue to retry PCP's office for assistance in more samples for the patient.   08/15/20: CPA called and spoke to  [employer] patient assistance representative regarding application status and emergency fill request for Januvia due to low on hand supply. Per [employer] representative; patient application was approved on 08/14/20. Application will take between 2 to 3 business days for it to reach the pharmacy department. CPA emphasized to the representative that the patient will need an emergency fill of  Januvia for the patient and if possible can it be shipped overnight. The representative kindly forwarded CPA to speak with a pharmacist for more detailed information regarding this matter. CPA was able to speak with the pharmacist [name] who stated it is possible for an emergency fill of Januvia be sent out overnight to patient. The only thing is the PCP Dr. Ronnald Ramp will need to fax over a new script to [employer] [phone] with only the patient's name and DOB. CPA forwarded this to PTM [name] who was able to reach other CPP, [name] who is helping [name], [facility] while on vacation. [name] was able to staff message Dr. Ronnald Ramp of this matter. Currently awaiting response.  CPA called and spoke to the patient to notify her application has been approved on 08/14/20. The patient verbalized understanding and expressed great appreciation. CPA also made the patient aware of emergency request for her [name] was messaged to Dr. Ronnald Ramp and is awaiting a response. Explained to the patient once Dr. Ronnald Ramp can fax it to [employer]; she could get an overnight shipment after it goes through.The patient verbalized understanding and stated "that would be much appreciated."   08/16/20- PTM [name] advised CPA that no response has been given yet to neither her or [name] regarding a new prescription order of Januvia written and faxed to [employer]. CPA called and spoke to [name] to advise her to call Dr. Ronnald Ramp office requesting more Tradjenta samples until [employer] receives application within that 2 to 3 day business window gap period. The patient verbalized understanding. The patient stated "I will contact [name] who is Dr. Ronnald Ramp nurse and request this; if I don't hear back from [name] I will wait until my upcoming appointment on Monday with Dr. Ronnald Ramp and request everything that is needed." The patient also states she does have enough Tradjenta tablets to hold her over until  Monday. CPA verbalized understanding.    Raynelle Highland, [place] Assistant [phone]   Follow-Up:  CPA to follow-up with the patient regarding pick-up of more Tradjenta samples and [employer].

## 2020-08-14 NOTE — Progress Notes (Deleted)
    Chronic Care Management Pharmacy Assistant   Name: [place] McCoy Cinnamon  MRN: [id number] DOB: 09-28-1929  Reason for Encounter: [employer] Patient Assistance Follow-up for [place]   PCP : Juline Patch, MD  Allergies:  No Known Allergies  Medications: Outpatient Encounter Medications as of 08/14/2020  Medication Sig Note  . Acetaminophen (TYLENOL ARTHRITIS PAIN PO) Take 2 tablets by mouth daily as needed.    Marland Kitchen ADVOCATE LANCETS MISC use once daily as directed to test blood sugar   . Blood Glucose Monitoring Suppl (FREESTYLE LITE) DEVI use once daily as directed to test blood sugar   . Cholecalciferol (VITAMIN D) 2000 units CAPS Take 1 capsule by mouth daily. otc   . Elderberry 575 MG/5ML SYRP Take 1 Dose by mouth daily.    Marland Kitchen glucose blood (FREESTYLE LITE) test strip USE  STRIP TO CHECK GLUCOSE ONCE DAILY AS DIRECTED   . Lancet Devices (ADVOCATE LANCING DEVICE) MISC use once daily as directed to test blood sugar   . lansoprazole (PREVACID) 15 MG capsule Take 1 capsule (15 mg total) by mouth 1 day or 1 dose.   Marland Kitchen lisinopril-hydrochlorothiazide (ZESTORETIC) 20-12.5 MG tablet Take 1 tablet by mouth daily. Dr Holley Raring   . LORazepam (ATIVAN) 0.5 MG tablet Take 1 tablet (0.5 mg total) by mouth as needed.   . Omega-3 Fatty Acids (FISH OIL) 1000 MG CAPS Take 1 capsule (1,000 mg total) by mouth daily.   . sertraline (ZOLOFT) 25 MG tablet Take 1 tablet (25 mg total) by mouth daily. 06/11/2020: Has been taking prn  . simvastatin (ZOCOR) 20 MG tablet Take 1 tablet (20 mg total) by mouth daily at 6 PM.   . sitaGLIPtin (JANUVIA) 25 MG tablet Take 1 tablet (25 mg total) by mouth daily.    No facility-administered encounter medications on file as of 08/14/2020.    Current Diagnosis: Patient Active Problem List   Diagnosis Date Noted  . Acute hypoxemic respiratory failure due to COVID-19 ([place]) 09/19/2019  . Stage 3 chronic kidney disease ([place]) 09/02/2019  . Diabetes mellitus with no complication ([place])  83/66/2947  . Familial multiple lipoprotein-type hyperlipidemia 01/03/2015  . Creatinine elevation 01/03/2015  . Diabetes ([place]) 12/15/2014  . Hyperlipidemia 12/15/2014  . Hypertension 12/15/2014  . Gastro-esophageal reflux disease without esophagitis 12/15/2014    Goals Addressed   None    08/14/2020-Related to [place]: Unsuccessful Call attempt to the patient to discuss the attestation form mailed from [place] for the patient to fill and mail back. Left a compliant HIPAA voicemail for a call back.  CPA called and spoke with [employer] Patient Software engineer who explained they did receive the attestation form from the patient however the application is incomplete because the [facility] License number was not provided. Once [employer] does receive this information the application will be complete and ready for processing. CPA will contact PCP for this urgent information needed to complete this initial process.   Raynelle Highland, [place] Assistant [phone]   Follow-Up:  Patient Assistance Coordination and Pharmacist Review

## 2020-08-16 NOTE — Progress Notes (Signed)
Chronic Care Management Pharmacy Assistant   Name: [place] McCoy Roads  MRN: [id number] DOB: 14-Jun-1930  Reason for Encounter: Coordination of care related to medication needs.    PCP : Juline Patch, MD  Allergies:  No Known Allergies  Medications: Outpatient Encounter Medications as of 08/14/2020  Medication Sig Note  . Acetaminophen (TYLENOL ARTHRITIS PAIN PO) Take 2 tablets by mouth daily as needed.    Marland Kitchen ADVOCATE LANCETS MISC use once daily as directed to test blood sugar   . Blood Glucose Monitoring Suppl (FREESTYLE LITE) DEVI use once daily as directed to test blood sugar   . Cholecalciferol (VITAMIN D) 2000 units CAPS Take 1 capsule by mouth daily. otc   . Elderberry 575 MG/5ML SYRP Take 1 Dose by mouth daily.    Marland Kitchen glucose blood (FREESTYLE LITE) test strip USE  STRIP TO CHECK GLUCOSE ONCE DAILY AS DIRECTED   . Lancet Devices (ADVOCATE LANCING DEVICE) MISC use once daily as directed to test blood sugar   . lansoprazole (PREVACID) 15 MG capsule Take 1 capsule (15 mg total) by mouth 1 day or 1 dose.   Marland Kitchen lisinopril-hydrochlorothiazide (ZESTORETIC) 20-12.5 MG tablet Take 1 tablet by mouth daily. Dr Holley Raring   . LORazepam (ATIVAN) 0.5 MG tablet Take 1 tablet (0.5 mg total) by mouth as needed.   . Omega-3 Fatty Acids (FISH OIL) 1000 MG CAPS Take 1 capsule (1,000 mg total) by mouth daily.   . sertraline (ZOLOFT) 25 MG tablet Take 1 tablet (25 mg total) by mouth daily. 06/11/2020: Has been taking prn  . simvastatin (ZOCOR) 20 MG tablet Take 1 tablet (20 mg total) by mouth daily at 6 PM.   . sitaGLIPtin (JANUVIA) 25 MG tablet Take 1 tablet (25 mg total) by mouth daily.    No facility-administered encounter medications on file as of 08/14/2020.    Current Diagnosis: Patient Active Problem List   Diagnosis Date Noted  . Acute hypoxemic respiratory failure due to COVID-19 ([place]) 09/19/2019  . Stage 3 chronic kidney disease ([place]) 09/02/2019  . Diabetes mellitus with no complication  ([place]) 63/09/6008  . Familial multiple lipoprotein-type hyperlipidemia 01/03/2015  . Creatinine elevation 01/03/2015  . Diabetes ([place]) 12/15/2014  . Hyperlipidemia 12/15/2014  . Hypertension 12/15/2014  . Gastro-esophageal reflux disease without esophagitis 12/15/2014    Goals Addressed   None    08-14-2020: Raynelle Highland, CPA reached out explaining the patient reported she was low on her supply of her Januvia. Per Candice, the patient was waiting on her approval for [employer] patient assistance for assistance with the cost of Januvia. Candice reached out to [employer] to the check status. Per [employer], the patient's application was approved and they were willing to send an "emergency overnight" refill to the patient, but they were needing a new prescription faxed from the PCP's office.  Candice Thomas reached out to me for assistance in requesting a new prescription to be faxed over to [employer] patient assistance.   Sent a staff message to the clinical team/department explaining the patient's need and what we were needed to assist the patient in obtaining an "emergency refill." Also made [name], [facility] aware of the situation and what was needed to resolve the problem. Gerald Stabs stated he would reach out to the PCP as well.   08-16-2020: Requested Raynelle Highland, CPA to reach back out to the patient on 08-16-20 to follow up on the requested need. Per Candice, the patient reported she has enough medications to last her until  Monday 08-19-2020. The patient also explained she has a scheduled PCP appointment on 08-19-2020 and she was going to request a new prescription to be faxed over to [employer]. Requested Candice to follow back up with the patient Monday 08-19-2020 to ensure her request was completed. [name], CPP was made aware of the situation and verbalized understanding.   [name], LPN Clinical Pharmacist Assistant  [phone]    Follow-Up:  CPA will follow up with the patient on 08-19-2020.

## 2020-08-21 ENCOUNTER — Telehealth: Payer: Self-pay | Admitting: Pharmacist

## 2020-08-22 ENCOUNTER — Telehealth: Payer: Self-pay

## 2020-08-22 NOTE — Chronic Care Management (AMB) (Signed)
Open in Error.

## 2020-08-22 NOTE — Chronic Care Management (AMB) (Incomplete)
Chronic Care Management Pharmacy  Name: Jordan Thornton  MRN: [id number] DOB: December 26, 1929   Chief Complaint/ HPI  Jordan Thornton,  86 y.o. , female presents for their Initial CCM visit with the clinical pharmacist via telephone due to COVID-19 Pandemic.  PCP : Jordan Patch, MD Patient Care Team: Jordan Patch, MD as PCP - General (Family Medicine) Jordan Legato, MD as Consulting Physician (Internal Medicine) Jordan Thornton, [facility] (Pharmacist)  Their chronic conditions include: Hypertension, Hyperlipidemia, Diabetes, GERD and Chronic Kidney Disease COVID 19 infection January 2021  Office Visits: 02/23/20-Jordan Thornton - Sertraline 47m qd, decrease lorazepam #20/30day. A1c   Consult Visit:05/17/20 - Jordan Thornton [facility]ph[facility] - eGFR decreased to 275mmin, repeat lab Scr 1.92 eGFr 26  No Known Allergies  Medications: Outpatient Encounter Medications as of 08/22/2020  Medication Sig Note  . Acetaminophen (TYLENOL ARTHRITIS PAIN PO) Take 2 tablets by mouth daily as needed.    . Marland KitchenDVOCATE LANCETS MISC use once daily as directed to test blood sugar   . Blood Glucose Monitoring Suppl (FREESTYLE LITE) DEVI use once daily as directed to test blood sugar   . Cholecalciferol (VITAMIN D) 2000 units CAPS Take 1 capsule by mouth daily. otc   . Elderberry 575 MG/5ML SYRP Take 1 Dose by mouth daily.    . Marland Kitchenlucose blood (FREESTYLE LITE) test strip USE  STRIP TO CHECK GLUCOSE ONCE DAILY AS DIRECTED   . Lancet Devices (ADVOCATE LANCING DEVICE) MISC use once daily as directed to test blood sugar   . lansoprazole (PREVACID) 15 MG capsule Take 1 capsule (15 mg total) by mouth 1 day or 1 dose.   . Marland Kitchenisinopril-hydrochlorothiazide (ZESTORETIC) 20-12.5 MG tablet Take 1 tablet by mouth daily. Jordan Thornton . LORazepam (ATIVAN) 0.5 MG tablet Take 1 tablet (0.5 mg total) by mouth as needed.   . Omega-3 Fatty Acids (FISH OIL) 1000 MG CAPS Take 1 capsule (1,000 mg total) by mouth daily.   . sertraline  (ZOLOFT) 25 MG tablet Take 1 tablet (25 mg total) by mouth daily. 06/11/2020: Has been taking prn  . simvastatin (ZOCOR) 20 MG tablet Take 1 tablet (20 mg total) by mouth daily at 6 PM.   . sitaGLIPtin (JANUVIA) 25 MG tablet Take 1 tablet (25 mg total) by mouth daily.    No facility-administered encounter medications on file as of 08/22/2020.    Wt Readings from Last 3 Encounters:  06/25/20 139 lb (63 kg)  06/17/20 140 lb (63.5 kg)  02/23/20 138 lb (62.6 kg)        Goals Addressed   None     Diabetes   A1c goal <8%  Recent Relevant Labs: Lab Results  Component Value Date/Time   HGBA1C 6.6 (H) 06/25/2020 10:55 AM   HGBA1C 6.6 (H) 02/23/2020 11:05 AM    Last diabetic Eye exam:  Lab Results  Component Value Date/Time   HMDIABEYEEXA No Retinopathy 07/19/2018 12:00 AM    Last diabetic Foot exam: No results found for: HMDIABFOOTEX   Checking BG: Daily  Recent FBG Readings:90s-120  Patient has failed these meds in past: glipizide (caused overnight hypoglycemia) Patient is currently controlled on the following medications: . Januvia 100 mg daily . Metformin 50056mid  We discussed: how to recognize and treat signs of hypoglycemia Jordan Thornton reports she has not experienced symptomatic hypoglycemia since stopping glipizide. She is checking FBG daily and is requesting a refill for test strips.Patient drinks 16oz of cranberry juice with breakfast but otherwise avoids  starchy/sugary foods and drinks. We discussed the high sugar content of juice and she will cut back her consumption. She saw Jordan Thornton on 9/03 and her eGFr had decreased to~26 ml/min SCR 1.96 . Metformin not discontinued repeat labs 9/17 eGFR still ~26 with scr slightly improved at 1.92 (BUN down to 27 from 32). Jordan Thornton denies any follow up instructions for the Jordan Thornton office. I reviewed lab values with her and advised her to call Jordan Thornton office for follow up instructions regarding lab work.    Plan Discussed with Jordan Thornton. Will decrease Januvia dose to 25 mg daily given eGFr< 30 mL/min, follow-up with Jordan Thornton in 6 weeks for repeat labs. Left message for Jordan Thornton regarding metformin.  Refill sent for test strips. Update 06/12/20 8:45 AM received call back from Jordan Thornton she got message that 25 mg Januvia prescription has been sent to [facility] and she will pick up today. She understands this is a dosage decrease due to renal dysfunction. She will follow up with Nephrology office today.    Hyperlipidemia   LDL goal < 100  Lipid Panel     Component Value Date/Time   CHOL 193 11/17/2019 1001   TRIG 116 11/17/2019 1001   HDL 55 11/17/2019 1001   LDLCALC 117 (H) 11/17/2019 1001    Hepatic Function Latest Ref Rng & Units 06/25/2020 10/17/2019 09/21/2019  Total Protein 6.5 - 8.1 g/dL - - 5.9(L)  Albumin 3.6 - 4.6 g/dL 4.7(H) 3.7 2.8(L)  AST 15 - 41 U/L - - 30  ALT 0 - 44 U/L - - 19  Alk Phosphatase 38 - 126 U/L - - 35(L)  Total Bilirubin 0.3 - 1.2 mg/dL - - 0.4  Bilirubin, Direct 0.00 - 0.40 mg/dL - - -     The ASCVD Risk score Jordan Thornton., et al., 2013) failed to calculate for the following reasons:   The 2013 ASCVD risk score is only valid for ages 36 to 72   Patient has failed these meds in past: Unavailable in chart Patient is currently uncontrolled on the following medications:  . Simvastatin 20 mg q 6pm  We discussed:  diet and exercise extensively. Patient does not have a structured exercise regimen but is very active cleaning house, cooking meals and still plays the organ at church.  Plan   Continue current medications   Hypertension/CKDIIIB   BP goal is:  <130/80  Office blood pressures are  BP Readings from Last 3 Encounters:  06/25/20 130/80  06/17/20 130/80  02/23/20 130/80   BMP Latest Ref Rng & Units 06/25/2020 10/17/2019 10/09/2019  Glucose 65 - 99 mg/dL 128(H) 115(H) 108(H)  BUN 8 - 27 mg/dL 30(H) 19 23  Creatinine 0.57 - 1.00 mg/dL 1.90(H)  1.66(H) 1.81(H)  BUN/Creat Ratio 12 - 28 16 11(L) 13  Sodium 134 - 144 mmol/L 143 147(H) 145(H)  Potassium 3.5 - 5.2 mmol/L 5.5(H) 4.9 5.0  Chloride 96 - 106 mmol/L 105 108(H) 107(H)  CO2 20 - 29 mmol/L '23 21 21  ' Calcium 8.7 - 10.3 mg/dL 10.5(H) 8.7 9.2  most recent eGFR 9/71/21~34m/min (Nephrology following)  Patient checks BP at home infrequently   Patient has failed these meds in the past: N/A Patient is currently controlled on the following medications:  . Lisinopril/hctz 20/12.540mdaily  We discussed: Patient has a blood pressure cuff at home but has quit checking stating her BP were always elevated at home while controlled at office visits. We discussed masked hypertension  and I advised her to bring in her electronic cuff to next PCP appointment to compare readings. Recent labs at Nephrology indicate decreased renal function eGFR~26.   Plan  Consider decreasing lisinopril dose given recent decline in renal function.      Anxiety   PHQ9 Score:  PHQ9 SCORE ONLY 06/25/2020 06/17/2020 02/23/2020  PHQ-9 Total Score 0 0 0   GAD7 Score: GAD 7 : Generalized Anxiety Score 06/25/2020 06/17/2020 02/23/2020 01/01/2020  Nervous, Anxious, on Edge 0 0 0 0  Control/stop worrying 0 0 1 0  Worry too much - different things 0 0 1 0  Trouble relaxing 0 0 0 0  Restless 0 0 0 0  Easily annoyed or irritable 0 0 0 0  Afraid - awful might happen 0 0 0 0  Total GAD 7 Score 0 0 2 0  Anxiety Difficulty - - Not difficult at all -    Patient has failed these meds in past: N/A Patient is currently controlled on the following medications:  . Lorazepam 0.5 mg daily prn (#20 must last 30 days) . 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 Counseled that sertraline is a maintenance med and should be taken daily to help prevent anxiety/nervousness.   Plan  Continue  current medications  GERD   Patient has failed these meds in past: N/A Patient is currently controlled on the following medications:  . Lansoprazole 15 mg ac breakfast  We discussed: Patient has noted that symptoms are more severe when she eats beef. When husbands schedule allows (he still works) they have dinner at 4:30-5:00 pm. She avoids snacking before bed and reports good control on this regimen.  Plan  Continue current medications     Medication Management   Pt uses [facility] pharmacy for all medications Pt endorses 99% compliance  We discussed: Discussed benefits of medication synchronization, packaging and delivery as well as enhanced pharmacist oversight with [facility]. She is currently happy with [facility] but glad to know this is an option.  Plan  Continue current medication management strategy    Follow up: 2  month phone visit  Junita Push. Kenton Kingfisher PharmD, [facility] [phone]

## 2020-09-02 ENCOUNTER — Other Ambulatory Visit: Payer: Self-pay

## 2020-09-02 ENCOUNTER — Ambulatory Visit
Admission: RE | Admit: 2020-09-02 | Discharge: 2020-09-02 | Disposition: A | Discharge status: Home / Self Care | Payer: PPO | Source: Ambulatory Visit | Attending: Family Medicine | Admitting: Family Medicine

## 2020-09-02 DIAGNOSIS — Z1231 Encounter for screening mammogram for malignant neoplasm of breast: Secondary | ICD-10-CM | POA: Insufficient documentation

## 2020-09-13 ENCOUNTER — Other Ambulatory Visit: Payer: Self-pay | Admitting: Family Medicine

## 2020-09-13 DIAGNOSIS — I10 Essential (primary) hypertension: Secondary | ICD-10-CM

## 2020-09-23 DIAGNOSIS — Z9842 Cataract extraction status, left eye: Secondary | ICD-10-CM | POA: Diagnosis not present

## 2020-09-23 DIAGNOSIS — H52223 Regular astigmatism, bilateral: Secondary | ICD-10-CM | POA: Diagnosis not present

## 2020-09-23 DIAGNOSIS — Z9841 Cataract extraction status, right eye: Secondary | ICD-10-CM | POA: Diagnosis not present

## 2020-09-23 DIAGNOSIS — E119 Type 2 diabetes mellitus without complications: Secondary | ICD-10-CM | POA: Diagnosis not present

## 2020-09-23 LAB — HM DIABETES EYE EXAM

## 2020-09-24 DIAGNOSIS — N2581 Secondary hyperparathyroidism of renal origin: Secondary | ICD-10-CM | POA: Diagnosis not present

## 2020-09-24 DIAGNOSIS — I10 Essential (primary) hypertension: Secondary | ICD-10-CM | POA: Diagnosis not present

## 2020-09-24 DIAGNOSIS — N1832 Chronic kidney disease, stage 3b: Secondary | ICD-10-CM | POA: Diagnosis not present

## 2020-09-24 DIAGNOSIS — D631 Anemia in chronic kidney disease: Secondary | ICD-10-CM | POA: Diagnosis not present

## 2020-09-24 DIAGNOSIS — R809 Proteinuria, unspecified: Secondary | ICD-10-CM | POA: Diagnosis not present

## 2020-09-24 DIAGNOSIS — E119 Type 2 diabetes mellitus without complications: Secondary | ICD-10-CM | POA: Diagnosis not present

## 2020-09-24 LAB — BASIC METABOLIC PANEL: Creatinine: 1.9 — AB (ref 0.5–1.1)

## 2020-09-25 NOTE — Progress Notes (Unsigned)
Entered eye exam

## 2020-09-26 ENCOUNTER — Telehealth: Payer: Self-pay | Admitting: Pharmacist

## 2020-09-26 NOTE — Telephone Encounter (Signed)
Received call from patient stating she had not received refill of Januvia from [employer]. PharmD called [facility] and spoke with [place].  Overnight shipment of 90 day supply requested after last truck left at 4 PM. Patient will receive on Saturday via [place].  For future refills patient to call [phone] option 9 when two week supply remaining. Verified patient will need new RX sent to [facility] in May. Original application erroneously completed for 30 day supply with 3 refills  Instead of 90 day with 3 refills. PCP sent in 90 day RX with 0 refills in December as requested for emergency fill.  Patient informed of expected shipment and given number to request future refills.

## 2020-09-29 ENCOUNTER — Other Ambulatory Visit: Payer: Self-pay | Admitting: Family Medicine

## 2020-09-29 DIAGNOSIS — E119 Type 2 diabetes mellitus without complications: Secondary | ICD-10-CM

## 2020-10-29 ENCOUNTER — Encounter: Payer: Self-pay | Admitting: Family Medicine

## 2020-10-29 ENCOUNTER — Ambulatory Visit (INDEPENDENT_AMBULATORY_CARE_PROVIDER_SITE_OTHER): Payer: PPO | Admitting: Family Medicine

## 2020-10-29 ENCOUNTER — Other Ambulatory Visit: Payer: Self-pay

## 2020-10-29 VITALS — BP 120/80 | HR 72 | Ht 65.0 in | Wt 141.0 lb

## 2020-10-29 DIAGNOSIS — Z9079 Acquired absence of other genital organ(s): Secondary | ICD-10-CM | POA: Diagnosis not present

## 2020-10-29 DIAGNOSIS — I10 Essential (primary) hypertension: Secondary | ICD-10-CM | POA: Diagnosis not present

## 2020-10-29 DIAGNOSIS — F419 Anxiety disorder, unspecified: Secondary | ICD-10-CM

## 2020-10-29 DIAGNOSIS — Z90710 Acquired absence of both cervix and uterus: Secondary | ICD-10-CM | POA: Diagnosis not present

## 2020-10-29 DIAGNOSIS — Z90722 Acquired absence of ovaries, bilateral: Secondary | ICD-10-CM | POA: Diagnosis not present

## 2020-10-29 DIAGNOSIS — K219 Gastro-esophageal reflux disease without esophagitis: Secondary | ICD-10-CM

## 2020-10-29 DIAGNOSIS — E782 Mixed hyperlipidemia: Secondary | ICD-10-CM | POA: Diagnosis not present

## 2020-10-29 DIAGNOSIS — E119 Type 2 diabetes mellitus without complications: Secondary | ICD-10-CM | POA: Diagnosis not present

## 2020-10-29 MED ORDER — LISINOPRIL-HYDROCHLOROTHIAZIDE 20-12.5 MG PO TABS: 1.0000 | ORAL_TABLET | Freq: Every day | ORAL | 1 refills | Status: DC

## 2020-10-29 MED ORDER — LANSOPRAZOLE 15 MG PO CPDR: 15.0000 mg | DELAYED_RELEASE_CAPSULE | ORAL | 1 refills | Status: DC

## 2020-10-29 MED ORDER — SIMVASTATIN 20 MG PO TABS: 20.0000 mg | ORAL_TABLET | Freq: Every day | ORAL | 1 refills | Status: DC

## 2020-10-29 MED ORDER — LORAZEPAM 0.5 MG PO TABS: 0.5000 mg | ORAL_TABLET | ORAL | 5 refills | Status: DC | PRN

## 2020-10-29 NOTE — Progress Notes (Signed)
Date:  10/29/2020   Name:  Jordan Thornton   DOB:  08/26/1930   MRN:  [id number]   Chief Complaint: Diabetes, Gastroesophageal Reflux, Hyperlipidemia, Hypertension, and Anxiety  Diabetes She presents for her follow-up diabetic visit. She has type 2 diabetes mellitus. Her disease course has been stable. Hypoglycemia symptoms include nervousness/anxiousness. Pertinent negatives for hypoglycemia include no dizziness, headaches or sweats. There are no diabetic associated symptoms. Pertinent negatives for diabetes include no blurred vision, no chest pain, no fatigue, no polydipsia, no polyphagia, no polyuria, no weakness and no weight loss. There are no hypoglycemic complications. Symptoms are stable. There are no diabetic complications. Risk factors for coronary artery disease include diabetes mellitus, hypertension and dyslipidemia. Current diabetic treatment includes oral agent (monotherapy). She is compliant with treatment all of the time. Her weight is stable. She is following a generally healthy diet. She participates in exercise intermittently. Her home blood glucose trend is fluctuating minimally. An ACE inhibitor/angiotensin II receptor blocker is being taken. She sees a podiatrist.Eye exam is current.  Gastroesophageal Reflux She reports no abdominal pain, no belching, no chest pain, no choking, no coughing, no early satiety, no globus sensation, no heartburn, no hoarse voice, no nausea, no sore throat, no stridor or no wheezing. This is a chronic problem. The current episode started more than 1 year ago. The problem occurs occasionally. The problem has been gradually improving. Pertinent negatives include no anemia, fatigue, melena, muscle weakness, orthopnea or weight loss. She has tried a PPI for the symptoms. The treatment provided moderate relief. Past procedures do not include an abdominal ultrasound, an EGD, esophageal manometry, esophageal pH monitoring, H. pylori antibody titer or a  UGI.  Hyperlipidemia This is a chronic problem. The current episode started more than 1 year ago. Recent lipid tests were reviewed and are normal. She has no history of chronic renal disease, diabetes, hypothyroidism, liver disease, obesity or nephrotic syndrome. Pertinent negatives include no chest pain, myalgias or shortness of breath. Current antihyperlipidemic treatment includes statins. The current treatment provides moderate improvement of lipids.  Hypertension This is a chronic problem. The current episode started more than 1 year ago. The problem has been gradually improving since onset. The problem is uncontrolled. Pertinent negatives include no anxiety, blurred vision, chest pain, headaches, malaise/fatigue, neck pain, orthopnea, palpitations, peripheral edema, PND, shortness of breath or sweats. Risk factors for coronary artery disease include dyslipidemia. Past treatments include ACE inhibitors and diuretics. The current treatment provides moderate improvement. There are no compliance problems.  There is no history of chronic renal disease, a hypertension causing med or renovascular disease.  Anxiety Presents for follow-up visit. Symptoms include nervous/anxious behavior. Patient reports no chest pain, dizziness, excessive worry, hyperventilation, nausea, palpitations or shortness of breath. Symptoms occur occasionally. The severity of symptoms is mild.      Lab Results  Component Value Date   CREATININE 1.9 (A) 09/24/2020   BUN 30 (H) 06/25/2020   NA 143 06/25/2020   K 5.5 (H) 06/25/2020   CL 105 06/25/2020   CO2 23 06/25/2020   Lab Results  Component Value Date   CHOL 193 11/17/2019   HDL 55 11/17/2019   LDLCALC 117 (H) 11/17/2019   TRIG 116 11/17/2019   CHOLHDL 2.8 03/02/2018   No results found for: TSH Lab Results  Component Value Date   HGBA1C 6.6 (H) 06/25/2020   Lab Results  Component Value Date   WBC 5.6 10/17/2019   HGB 9.9 (L) 10/17/2019  HCT 30.0 (L)  10/17/2019   MCV 79 10/17/2019   PLT 200 10/17/2019   Lab Results  Component Value Date   ALT 19 09/21/2019   AST 30 09/21/2019   ALKPHOS 35 (L) 09/21/2019   BILITOT 0.4 09/21/2019     Review of Systems  Constitutional: Negative.  Negative for chills, fatigue, fever, malaise/fatigue, unexpected weight change and weight loss.  HENT: Negative for congestion, ear discharge, ear pain, hoarse voice, rhinorrhea, sinus pressure, sneezing and sore throat.   Eyes: Negative for blurred vision, double vision, photophobia, pain, discharge, redness and itching.  Respiratory: Negative for cough, choking, shortness of breath, wheezing and stridor.   Cardiovascular: Negative for chest pain, palpitations, orthopnea and PND.  Gastrointestinal: Negative for abdominal pain, blood in stool, constipation, diarrhea, heartburn, melena, nausea and vomiting.  Endocrine: Negative for cold intolerance, heat intolerance, polydipsia, polyphagia and polyuria.  Genitourinary: Negative for dysuria, flank pain, frequency, hematuria, menstrual problem, pelvic pain, urgency, vaginal bleeding and vaginal discharge.  Musculoskeletal: Negative for arthralgias, back pain, myalgias, muscle weakness and neck pain.  Skin: Negative for rash.  Allergic/Immunologic: Negative for environmental allergies and food allergies.  Neurological: Negative for dizziness, weakness, light-headedness, numbness and headaches.  Hematological: Negative for adenopathy. Does not bruise/bleed easily.  Psychiatric/Behavioral: Negative for dysphoric mood. The patient is nervous/anxious.     Patient Active Problem List   Diagnosis Date Noted  . H/O total hysterectomy with bilateral salpingo-oophorectomy (BSO) 10/29/2020  . Anxiety 10/29/2020  . Acute hypoxemic respiratory failure due to COVID-19 ([place]) 09/19/2019  . Stage 3 chronic kidney disease ([place]) 09/02/2019  . Diabetes mellitus with no complication ([place]) 0000000  . Familial multiple  lipoprotein-type hyperlipidemia 01/03/2015  . Creatinine elevation 01/03/2015  . Diabetes ([place]) 12/15/2014  . Hyperlipidemia 12/15/2014  . Hypertension 12/15/2014  . Gastro-esophageal reflux disease without esophagitis 12/15/2014    Allergies  Allergen Reactions  . Sertraline     Made her more depressed    Past Surgical History:  Procedure Laterality Date  . bladder tack    . BREAST BIOPSY Left 10-15 yrs ago   benign  . CATARACT EXTRACTION Bilateral 05/2015  . CHOLECYSTECTOMY OPEN    . VAGINAL HYSTERECTOMY      Social History   Tobacco Use  . Smoking status: Former Smoker    Packs/day: 0.25    Years: 25.00    Pack years: 6.25    Types: Cigarettes    Quit date: 1982    Years since quitting: 40.1  . Smokeless tobacco: Never Used  . Tobacco comment: smoking cessation materials not required  Vaping Use  . Vaping Use: Never used  Substance Use Topics  . Alcohol use: No    Alcohol/week: 0.0 standard drinks  . Drug use: No     Medication list has been reviewed and updated.  Current Meds  Medication Sig  . Acetaminophen (TYLENOL ARTHRITIS PAIN PO) Take 2 tablets by mouth daily as needed.   Marland Kitchen ADVOCATE LANCETS MISC use once daily as directed to test blood sugar  . Blood Glucose Monitoring Suppl (FREESTYLE LITE) DEVI use once daily as directed to test blood sugar  . Cholecalciferol (VITAMIN D) 2000 units CAPS Take 1 capsule by mouth daily. otc  . Elderberry 575 MG/5ML SYRP Take 1 Dose by mouth daily.   Marland Kitchen FREESTYLE LITE test strip USE STRIP TO CHECK GLUCOSE ONCE DAILY AS DIRECTED  . Lancet Devices (ADVOCATE LANCING DEVICE) MISC use once daily as directed to test blood sugar  .  Omega-3 Fatty Acids (FISH OIL) 1000 MG CAPS Take 1 capsule (1,000 mg total) by mouth daily.  . sitaGLIPtin (JANUVIA) 25 MG tablet Take 1 tablet (25 mg total) by mouth daily.  . [DISCONTINUED] lansoprazole (PREVACID) 15 MG capsule Take 1 capsule (15 mg total) by mouth 1 day or 1 dose.  .  [DISCONTINUED] lisinopril-hydrochlorothiazide (ZESTORETIC) 20-12.5 MG tablet Take 1 tablet by mouth once daily  . [DISCONTINUED] LORazepam (ATIVAN) 0.5 MG tablet Take 1 tablet (0.5 mg total) by mouth as needed.  . [DISCONTINUED] simvastatin (ZOCOR) 20 MG tablet Take 1 tablet (20 mg total) by mouth daily at 6 PM.    PHQ 2/9 Scores 10/29/2020 06/25/2020 06/17/2020 02/23/2020  PHQ - 2 Score 0 0 0 0  PHQ- 9 Score 0 0 0 0    GAD 7 : Generalized Anxiety Score 10/29/2020 06/25/2020 06/17/2020 02/23/2020  Nervous, Anxious, on Edge 0 0 0 0  Control/stop worrying 0 0 0 1  Worry too much - different things 0 0 0 1  Trouble relaxing 0 0 0 0  Restless 0 0 0 0  Easily annoyed or irritable 0 0 0 0  Afraid - awful might happen 0 0 0 0  Total GAD 7 Score 0 0 0 2  Anxiety Difficulty - - - Not difficult at all    BP Readings from Last 3 Encounters:  10/29/20 120/80  06/25/20 130/80  06/17/20 130/80    Physical Exam Constitutional:      General: She is not in acute distress.    Appearance: She is not diaphoretic.  HENT:     Head: Normocephalic and atraumatic.     Right Ear: External ear normal.     Left Ear: External ear normal.     Nose: Nose normal.     Mouth/Throat:     Mouth: Oropharynx is clear and moist.  Eyes:     General:        Right eye: No discharge.        Left eye: No discharge.     Extraocular Movements: EOM normal.     Conjunctiva/sclera: Conjunctivae normal.     Pupils: Pupils are equal, round, and reactive to light.  Neck:     Thyroid: No thyromegaly.     Vascular: No JVD.  Cardiovascular:     Rate and Rhythm: Normal rate and regular rhythm.     Pulses: Intact distal pulses.          Carotid pulses are 2+ on the right side and 2+ on the left side.      Radial pulses are 2+ on the right side and 2+ on the left side.       Femoral pulses are 2+ on the right side and 2+ on the left side.      Popliteal pulses are 2+ on the right side and 2+ on the left side.       Dorsalis  pedis pulses are 2+ on the right side and 2+ on the left side.       Posterior tibial pulses are 2+ on the right side and 2+ on the left side.     Heart sounds: Normal heart sounds, S1 normal and S2 normal. No murmur heard.  No systolic murmur is present.  No diastolic murmur is present. No friction rub. No gallop. No S3 or S4 sounds.   Pulmonary:     Effort: Pulmonary effort is normal.     Breath sounds: Normal breath sounds.  Abdominal:     General: Bowel sounds are normal.     Palpations: Abdomen is soft. There is no mass.     Tenderness: There is no abdominal tenderness. There is no guarding.  Musculoskeletal:        General: No edema. Normal range of motion.     Cervical back: Normal range of motion and neck supple.  Lymphadenopathy:     Cervical: No cervical adenopathy.  Skin:    General: Skin is warm and dry.  Neurological:     Mental Status: She is alert.     Deep Tendon Reflexes: Reflexes are normal and symmetric.     Wt Readings from Last 3 Encounters:  10/29/20 141 lb (64 kg)  06/25/20 139 lb (63 kg)  06/17/20 140 lb (63.5 kg)    BP 120/80   Pulse 72   Ht '5\' 5"'$  (1.651 m)   Wt 141 lb (64 kg)   BMI 23.46 kg/m    Assessment and Plan: 1. Diabetes mellitus with no complication (HCC) Chronic.  Controlled.  Stable.  Continue Januvia 25 mg daily.  We will check A1c for current level of control. - HgB A1c  2. Essential hypertension Chronic.  Controlled.  Stable.  Blood pressure today is 120/80 continue lisinopril hydrochlorothiazide 20-12 0.5.  Will check BMP for current electrolytes and GFR. - Basic metabolic panel - lisinopril-hydrochlorothiazide (ZESTORETIC) 20-12.5 MG tablet; Take 1 tablet by mouth daily.  Dispense: 90 tablet; Refill: 1  3. Mixed hyperlipidemia Chronic.  Controlled.  Stable.  Continue simvastatin 20 mg once a day.  Will check lipid panel for current level of control. - simvastatin (ZOCOR) 20 MG tablet; Take 1 tablet (20 mg total) by mouth  daily at 6 PM.  Dispense: 90 tablet; Refill: 1 - Lipid Panel With LDL/HDL Ratio  4. Gastro-esophageal reflux disease without esophagitis Chronic.  Controlled.  Stable.  Continue Prevacid 15 mg once a day. - lansoprazole (PREVACID) 15 MG capsule; Take 1 capsule (15 mg total) by mouth 1 day or 1 dose.  Dispense: 90 capsule; Refill: 1  5. Anxiety Chronic.  Controlled.  Stable.  Gad score is 0.  PHQ is 0.  We will decrease lorazepam to 0.5 as needed but only 15 tablets other than the previous 20/30 day period. - LORazepam (ATIVAN) 0.5 MG tablet; Take 1 tablet (0.5 mg total) by mouth as needed. Per 30 days  Dispense: 15 tablet; Refill: 5 - HgB A1c  6. H/O total hysterectomy with bilateral salpingo-oophorectomy (BSO) Patient with history of total hysterectomy with salpingo-oophorectomy bilateral.

## 2020-10-30 LAB — LIPID PANEL WITH LDL/HDL RATIO
Cholesterol, Total: 180 mg/dL (ref 100–199)
HDL: 67 mg/dL (ref >39)
LDL Chol Calc (NIH): 97 mg/dL (ref 0–99)
LDL/HDL Ratio: 1.4 ratio (ref 0.0–3.2)
Triglycerides: 90 mg/dL (ref 0–149)
VLDL Cholesterol Cal: 16 mg/dL (ref 5–40)

## 2020-10-30 LAB — HEMOGLOBIN A1C
Est. average glucose Bld gHb Est-mCnc: 143 mg/dL
Hgb A1c MFr Bld: 6.6 % — ABNORMAL HIGH (ref 4.8–5.6)

## 2020-12-23 ENCOUNTER — Telehealth: Payer: Self-pay

## 2020-12-23 NOTE — Chronic Care Management (AMB) (Signed)
Chronic Care Management Pharmacy Assistant   Name: Jordan Thornton MRN: [id number] DOB: Mar 19, 1930  Reason for Encounter: Diabetes Mellitus Disease State Call   Recent office visits:  10/29/20- Otilio Miu, MD- General OV 09/23/20- Belinda Block-  Type 2 DM   Recent consult visits:  09/24/20- Munsoor Guthrie (Nephrology) - Stage 3b CKD  Hospital visits:  None in previous 6 months  Medications: Outpatient Encounter Medications as of 12/23/2020  Medication Sig  . Acetaminophen (TYLENOL ARTHRITIS PAIN PO) Take 2 tablets by mouth daily as needed.   Marland Kitchen ADVOCATE LANCETS MISC use once daily as directed to test blood sugar  . Blood Glucose Monitoring Suppl (FREESTYLE LITE) DEVI use once daily as directed to test blood sugar  . Cholecalciferol (VITAMIN D) 2000 units CAPS Take 1 capsule by mouth daily. otc  . Elderberry 575 MG/5ML SYRP Take 1 Dose by mouth daily.   Marland Kitchen FREESTYLE LITE test strip USE STRIP TO CHECK GLUCOSE ONCE DAILY AS DIRECTED  . Lancet Devices (ADVOCATE LANCING DEVICE) MISC use once daily as directed to test blood sugar  . lansoprazole (PREVACID) 15 MG capsule Take 1 capsule (15 mg total) by mouth 1 day or 1 dose.  Marland Kitchen lisinopril-hydrochlorothiazide (ZESTORETIC) 20-12.5 MG tablet Take 1 tablet by mouth daily.  Marland Kitchen LORazepam (ATIVAN) 0.5 MG tablet Take 1 tablet (0.5 mg total) by mouth as needed. Per 30 days  . Omega-3 Fatty Acids (FISH OIL) 1000 MG CAPS Take 1 capsule (1,000 mg total) by mouth daily.  . simvastatin (ZOCOR) 20 MG tablet Take 1 tablet (20 mg total) by mouth daily at 6 PM.  . sitaGLIPtin (JANUVIA) 25 MG tablet Take 1 tablet (25 mg total) by mouth daily.   No facility-administered encounter medications on file as of 12/23/2020.   Recent Relevant Labs: Lab Results  Component Value Date/Time   HGBA1C 6.6 (H) 10/29/2020 11:10 AM   HGBA1C 6.6 (H) 06/25/2020 10:55 AM    Kidney Function Lab Results  Component Value Date/Time   CREATININE 1.9 (A)  09/24/2020 12:00 AM   CREATININE 1.90 (H) 06/25/2020 10:55 AM   CREATININE 1.66 (H) 10/17/2019 11:10 AM   GFRNONAA 23 (L) 06/25/2020 10:55 AM   GFRAA 27 (L) 06/25/2020 10:55 AM    Current antihyperglycemic regimen:  Januvia '25mg'$ - take one tab daily   What recent interventions/DTPs have been made to improve glycemic control:  No visits noted   Have there been any recent hospitalizations or ED visits since last visit with CPP? No   Patient denies hypoglycemic symptoms, including Pale, Sweaty, Shaky, Hungry, Nervous/irritable and Vision changes   Patient denies hyperglycemic symptoms, including blurry vision, excessive thirst, fatigue, polyuria and weakness   How often are you checking your blood sugar?  Patient stated she checks her blood sugar once daily and before breakfast   What are your blood sugars ranging?  o Fasting: 114,123,123,132  o Before meals: n/a o After meals: n/a o Bedtime: n/a  During the week, how often does your blood glucose drop below 70? Never   Are you checking your feet daily/regularly?  Patient stated she checks her feet daily and they look good.   Adherence Review: Is the patient currently on a STATIN medication? Yes  Simvastatin '20mg'$ - 90DS last filled 09/18/20  Is the patient currently on ACE/ARB medication? Yes  Lisinopril- hydrochlorothiazide 20-12.'5mg'$    Does the patient have >5 day gap between last estimated fill dates? No  Star Rating Drugs: Lisinopril- hydrochlorothiazide 20-12.'5mg'$ - 90DS last filled 09/16/20  Simvastatin '20mg'$ - 90DS last filled 09/18/20 Sitagliptin '25mg'$ - Receives through patient assistance   [employer] CPA, CMA

## 2020-12-24 ENCOUNTER — Other Ambulatory Visit: Payer: Self-pay | Admitting: Family Medicine

## 2020-12-24 ENCOUNTER — Telehealth: Payer: Self-pay | Admitting: Family Medicine

## 2020-12-24 DIAGNOSIS — E119 Type 2 diabetes mellitus without complications: Secondary | ICD-10-CM

## 2020-12-24 NOTE — Telephone Encounter (Signed)
Pt stated that [facility] would be sending a form for Dr. Ronnald Ramp to fill out for pt's sitaGLIPtin (JANUVIA) 25 MG tablet rx. Stated pt's rx with them had run out and Dr. Ronnald Ramp will have to fill out the form and send back to them.

## 2020-12-25 ENCOUNTER — Other Ambulatory Visit: Payer: Self-pay | Admitting: Family Medicine

## 2020-12-25 DIAGNOSIS — I10 Essential (primary) hypertension: Secondary | ICD-10-CM

## 2020-12-25 DIAGNOSIS — E782 Mixed hyperlipidemia: Secondary | ICD-10-CM

## 2020-12-31 NOTE — Telephone Encounter (Signed)
Patient FYI that a [employer] form is coming for [place] medication.

## 2021-01-01 ENCOUNTER — Other Ambulatory Visit: Payer: Self-pay | Admitting: Family Medicine

## 2021-01-01 DIAGNOSIS — I10 Essential (primary) hypertension: Secondary | ICD-10-CM

## 2021-01-01 DIAGNOSIS — E782 Mixed hyperlipidemia: Secondary | ICD-10-CM

## 2021-01-02 ENCOUNTER — Other Ambulatory Visit: Payer: Self-pay

## 2021-01-02 DIAGNOSIS — I10 Essential (primary) hypertension: Secondary | ICD-10-CM

## 2021-01-02 DIAGNOSIS — E782 Mixed hyperlipidemia: Secondary | ICD-10-CM

## 2021-01-02 MED ORDER — LISINOPRIL-HYDROCHLOROTHIAZIDE 20-12.5 MG PO TABS: 1.0000 | ORAL_TABLET | Freq: Every day | ORAL | 1 refills | Status: DC

## 2021-01-02 MED ORDER — SIMVASTATIN 20 MG PO TABS: 20.0000 mg | ORAL_TABLET | Freq: Every day | ORAL | 1 refills | Status: DC

## 2021-01-08 ENCOUNTER — Telehealth: Payer: Self-pay

## 2021-01-08 NOTE — Telephone Encounter (Signed)
My brain and my fingers aren't aligned today. Thank you for catching that. Correction. Januvia 25 mg. Patient gets through patient assistance program and they need a new prescription. They were supposed to fax a request to the office but we haven't received. Sorry for the confusion.

## 2021-01-08 NOTE — Chronic Care Management (AMB) (Signed)
Called [facility] pharmacy to request prescription authorization form to be faxed to [place] advised me to fax a new 90 day prescription to their pharmacy for [place] to [phone].

## 2021-01-08 NOTE — Telephone Encounter (Signed)
Pt is stating Januvia, you are saying Jardiance? What is going on?

## 2021-01-08 NOTE — Telephone Encounter (Signed)
Can we please fax a new RX for Jardiance 25 mg #90 with 3 refills to [employer] patient assistance please? [phone].

## 2021-01-08 NOTE — Telephone Encounter (Signed)
Faxed Januvia Rx to number provided #90 with 1 refill due to pt needing to be seen every 4 months. I received confirmation on it as well

## 2021-01-08 NOTE — Telephone Encounter (Signed)
Good Morning. Have we seen this form yet?

## 2021-01-09 NOTE — Telephone Encounter (Signed)
Perfect. Thank you!

## 2021-01-14 ENCOUNTER — Ambulatory Visit (INDEPENDENT_AMBULATORY_CARE_PROVIDER_SITE_OTHER): Payer: PPO | Admitting: Pharmacist

## 2021-01-14 DIAGNOSIS — E119 Type 2 diabetes mellitus without complications: Secondary | ICD-10-CM | POA: Diagnosis not present

## 2021-01-14 DIAGNOSIS — N1832 Chronic kidney disease, stage 3b: Secondary | ICD-10-CM

## 2021-01-14 NOTE — Progress Notes (Signed)
,   Chronic Care Management Pharmacy Note  01/30/2021 Name:  Jordan Thornton MRN:  [id number] DOB:  Jul 31, 1930  Subjective: Jordan Thornton is an 87 y.o. year old female who is a primary patient of Juline Patch, MD.  The CCM team was consulted for assistance with disease management and care coordination needs.    Engaged with patient by telephone for follow up visit in response to provider referral for pharmacy case management and/or care coordination services.   Consent to Services:  The patient was given information about Chronic Care Management services, agreed to services, and gave verbal consent prior to initiation of services.  Please see initial visit note for detailed documentation.   Patient Care Team: Juline Patch, MD as PCP - General (Family Medicine) Anthonette Legato, MD as Consulting Physician (Internal Medicine) Vladimir Faster, [facility] (Pharmacist)  Recent office visits: 2/15/22Ronnald Ramp (PCP)- blood work, no med changes  Recent consult visits: 1/11/22Holley Raring (nephrology)- lab work, no changes 1/10/22Rick Duff (optometry)-  Hospital visits: None in previous 6 months  Objective:  Lab Results  Component Value Date   CREATININE 1.9 (A) 09/24/2020   BUN 30 (H) 06/25/2020   GFRNONAA 23 (L) 06/25/2020   GFRAA 27 (L) 06/25/2020   NA 143 06/25/2020   K 5.5 (H) 06/25/2020   CALCIUM 10.5 (H) 06/25/2020   CO2 23 06/25/2020   GLUCOSE 128 (H) 06/25/2020    Lab Results  Component Value Date/Time   HGBA1C 6.6 (H) 10/29/2020 11:10 AM   HGBA1C 6.6 (H) 06/25/2020 10:55 AM    Last diabetic Eye exam:  Lab Results  Component Value Date/Time   HMDIABEYEEXA No Retinopathy 09/23/2020 12:00 AM    Last diabetic Foot exam: No results found for: HMDIABFOOTEX   Lab Results  Component Value Date   CHOL 180 10/29/2020   HDL 67 10/29/2020   LDLCALC 97 10/29/2020   TRIG 90 10/29/2020   CHOLHDL 2.8 03/02/2018    Hepatic Function Latest Ref Rng & Units  06/25/2020 10/17/2019 09/21/2019  Total Protein 6.5 - 8.1 g/dL - - 5.9(L)  Albumin 3.6 - 4.6 g/dL 4.7(H) 3.7 2.8(L)  AST 15 - 41 U/L - - 30  ALT 0 - 44 U/L - - 19  Alk Phosphatase 38 - 126 U/L - - 35(L)  Total Bilirubin 0.3 - 1.2 mg/dL - - 0.4  Bilirubin, Direct 0.00 - 0.40 mg/dL - - -    No results found for: TSH, FREET4  CBC Latest Ref Rng & Units 10/17/2019 10/09/2019 09/21/2019  WBC 3.4 - 10.8 x10E3/uL 5.6 9.4 9.0  Hemoglobin 11.1 - 15.9 g/dL 9.9(L) 10.6(L) 10.1(L)  Hematocrit 34.0 - 46.6 % 30.0(L) 31.7(L) 30.5(L)  Platelets 150 - 450 x10E3/uL 200 85(LL) 149(L)    No results found for: VD25OH  Clinical ASCVD: No  The ASCVD Risk score Mikey Bussing DC Jr., et al., 2013) failed to calculate for the following reasons:   The 2013 ASCVD risk score is only valid for ages 73 to 38    Depression screen PHQ 2/9 10/29/2020 06/25/2020 06/17/2020  Decreased Interest 0 0 0  Down, Depressed, Hopeless 0 0 0  PHQ - 2 Score 0 0 0  Altered sleeping 0 0 0  Tired, decreased energy 0 0 0  Change in appetite 0 0 0  Feeling bad or failure about yourself  0 0 0  Trouble concentrating 0 0 0  Moving slowly or fidgety/restless 0 0 0  Suicidal thoughts 0 0 0  PHQ-9  Score 0 0 0  Difficult doing work/chores - - -  Some recent data might be hidden      Social History   Tobacco Use  Smoking Status Former Smoker  . Packs/day: 0.25  . Years: 25.00  . Pack years: 6.25  . Types: Cigarettes  . Quit date: 65  . Years since quitting: 40.4  Smokeless Tobacco Never Used  Tobacco Comment   smoking cessation materials not required   BP Readings from Last 3 Encounters:  10/29/20 120/80  06/25/20 130/80  06/17/20 130/80   Pulse Readings from Last 3 Encounters:  10/29/20 72  06/25/20 80  06/17/20 64   Wt Readings from Last 3 Encounters:  10/29/20 141 lb (64 kg)  06/25/20 139 lb (63 kg)  06/17/20 140 lb (63.5 kg)   BMI Readings from Last 3 Encounters:  10/29/20 23.46 kg/m  06/25/20 23.13 kg/m   06/17/20 23.30 kg/m    Assessment/Interventions: Review of patient past medical history, allergies, medications, health status, including review of consultants reports, laboratory and other test data, was performed as part of comprehensive evaluation and provision of chronic care management services.   SDOH:  (Social Determinants of Health) assessments and interventions performed: No  SDOH Screenings   Alcohol Screen: Low Risk   . Last Alcohol Screening Score (AUDIT): 0  Depression (PHQ2-9): Low Risk   . PHQ-2 Score: 0  Financial Resource Strain: Low Risk   . Difficulty of Paying Living Expenses: Not hard at all  Food Insecurity: No Food Insecurity  . Worried About Charity fundraiser in the Last Year: Never true  . Ran Out of Food in the Last Year: Never true  Housing: Low Risk   . Last Housing Risk Score: 0  Physical Activity: Inactive  . Days of Exercise per Week: 0 days  . Minutes of Exercise per Session: 0 min  Social Connections: Moderately Integrated  . Frequency of Communication with Friends and Family: More than three times a week  . Frequency of Social Gatherings with Friends and Family: Three times a week  . Attends Religious Services: More than 4 times per year  . Active Member of Clubs or Organizations: No  . Attends Archivist Meetings: Never  . Marital Status: Married  Stress: No Stress Concern Present  . Feeling of Stress : Only a little  Tobacco Use: Medium Risk  . Smoking Tobacco Use: Former Smoker  . Smokeless Tobacco Use: Never Used  Transportation Needs: No Transportation Needs  . Lack of Transportation (Medical): No  . Lack of Transportation (Non-Medical): No      Immunization History  Administered Date(s) Administered  . Fluad Quad(high Dose 65+) 05/04/2019  . Influenza, High Dose Seasonal PF 06/19/2017, 05/23/2018  . Influenza-Unspecified 06/19/2017, 05/23/2018, 05/29/2020  . PFIZER(Purple Top)SARS-COV-2 Vaccination 11/10/2019,  12/06/2019, 06/12/2020  . Pneumococcal Conjugate-13 05/23/2015  . Pneumococcal Polysaccharide-23 09/14/2008  . Td 03/14/2006  . Tdap 05/23/2015  . Zoster Recombinat (Shingrix) 06/10/2018, 08/13/2018    Conditions to be addressed/monitored:  Hypertension, Hyperlipidemia, Diabetes, GERD, Chronic Kidney Disease and Anxiety  Care Plan : [place]  Updates made by Vladimir Faster, [place] since 01/30/2021 12:00 AM    Problem: DM2, CKD, HTN, HLD,   Priority: High    Goal: Disease Management   Start Date: 01/14/2021  This Visit's Progress: On track  Priority: High  Note:   Current Barriers:  . Unable to independently afford treatment regimen . Unable to achieve control of hyperlipidemia .  Suboptimal therapeutic regimen for CKD  Pharmacist Clinical Goal(s):  Marland Kitchen Patient will verbalize ability to afford treatment regimen . maintain control of diabetes, hypertension and ckd as evidenced by lab values  . adhere to plan to optimize therapeutic regimen for CKD as evidenced by report of adherence to recommended medication management changes through collaboration with PharmD and provider.   Interventions: . 1:1 collaboration with Juline Patch, MD regarding development and update of comprehensive plan of care as evidenced by provider attestation and co-signature . Inter-disciplinary care team collaboration (see longitudinal plan of care) . Comprehensive medication review performed; medication list updated in electronic medical record BP Readings from Last 3 Encounters:  10/29/20 120/80  06/25/20 130/80  06/17/20 130/80    Hypertension / CKD III(BP goal <130/80) -Controlled -Current treatment: . Lisinopril /hctz 20/12.5 mg qd -Medications previously tried: NA -Current home readings: <140/80 -Current dietary habits: has stopped drinking juice, eats a mostly healthy diet -Current exercise habits: very active  -Denies hypotensive/hypertensive symptoms -Educated on BP goals and  benefits of medications for prevention of heart attack, stroke and kidney damage; Daily salt intake goal < 2300 mg; Exercise goal of 150 minutes per week; Symptoms of hypotension and importance of maintaining adequate hydration; -Counseled to monitor BP at home weekly , document, and provide log at future appointments -Counseled on diet and exercise extensively Recommended to continue current medication  Lab Results  Component Value Date   [place] 97 10/29/2020    Hyperlipidemia: (LDL goal < 100) -Controlled -Current treatment: . Simvastatin 20 mg  -Medications previously tried: NA --Educated on Benefits of statin for ASCVD risk reduction; Exercise goal of 150 minutes per week; -Recommended to continue current medication Recommended could consider increasing to higher intensity statin for a more stringent ldl goal of <70  Lab Results  Component Value Date   HGBA1C 6.6 (H) 10/29/2020   Lab Results  Component Value Date   CREATININE 1.9 (A) 09/24/2020     Diabetes/ CKD III (A1c goal <7%) -Controlled -Current medications: . januvia 25 mg qd -Medications previously tried: glipizide  -Current home glucose readings . fasting glucose: 124, 113,97,104,108,115 . post prandial glucose: not checking -Denies hypoglycemic/hyperglycemic symptoms -Educated on A1c and blood sugar goals; Exercise goal of 150 minutes per week; Prevention and management of hypoglycemic episodes; Renal protective benefit of sglt-2 if GFR >35m/min -Counseled to check feet daily and get yearly eye exams -Educated on renal/cv benefit of sGLT-2 compared to DPP4 I.  Initiation requries GFR> 262mmin Assessed patient supply on hand of PAP Januvia and understanding orefill process Consider changing Januvia to FaWilder Gladef next EGFR allows to preserve renal function.  Patient Goals/Self-Care Activities . Patient will:  - take medications as prescribed check glucose daily, document, and provide at future  appointments check blood pressure weekly, document, and provide at future appointments collaborate with provider on medication access solutions target a minimum of 150 minutes of moderate intensity exercise weekly  Follow Up Plan: Telephone follow up appointment with care management team member scheduled for: 3 months          Medication Assistance: Januviaobtained through MeDIRECTVedication assistance program.  Enrollment ends 09/13/21  Patient[place] pharmacy is:  [place] [address]: [phone]: [phone]: [phone]: [phone] [place]  [address] [place] [address] Phone: [phone] Fax: [phone]  Uses pill box? Yes Pt endorses 95% compliance  We discussed: Benefits of medication synchronization, packaging and delivery as well as enhanced pharmacist oversight with [employer]. Patient decided to: Continue current medication management strategy  Care Plan and Follow Up Patient Decision:  Patient agrees to Care Plan and Follow-up.  Plan: Telephone follow up appointment with care management team member scheduled for:  1 month CPa-  3 months PharmD  Junita Push. Kenton Kingfisher PharmD, [facility] [phone]

## 2021-01-23 DIAGNOSIS — D631 Anemia in chronic kidney disease: Secondary | ICD-10-CM | POA: Diagnosis not present

## 2021-01-23 DIAGNOSIS — I10 Essential (primary) hypertension: Secondary | ICD-10-CM | POA: Diagnosis not present

## 2021-01-23 DIAGNOSIS — R809 Proteinuria, unspecified: Secondary | ICD-10-CM | POA: Diagnosis not present

## 2021-01-23 DIAGNOSIS — N184 Chronic kidney disease, stage 4 (severe): Secondary | ICD-10-CM | POA: Diagnosis not present

## 2021-01-23 DIAGNOSIS — N2581 Secondary hyperparathyroidism of renal origin: Secondary | ICD-10-CM | POA: Diagnosis not present

## 2021-01-30 NOTE — Patient Instructions (Addendum)
Visit Information  It was a pleasure speaking with you today. Thank you for letting me be part of your clinical team. Please call with any questions or concerns.   Goals Addressed            This Visit's Progress   . Monitor and Manage My Blood Sugar-Diabetes Type 2       Timeframe:  Long-Range Goal Priority:  High Start Date:                             Expected End Date:                       Follow Up Date 3 month follow up - check blood sugar at prescribed times - check blood sugar if I feel it is too high or too low - enter blood sugar readings and medication or insulin into daily log - take the blood sugar meter to all doctor visits    Why is this important?    Checking your blood sugar at home helps to keep it from getting very high or very low.   Writing the results in a diary or log helps the doctor know how to care for you.   Your blood sugar log should have the time, date and the results.   Also, write down the amount of insulin or other medicine that you take.   Other information, like what you ate, exercise done and how you were feeling, will also be helpful.     Notes:     . Track and Manage My Blood Pressure-Hypertension       Timeframe:  Long-Range Goal Priority:  Medium Start Date:                             Expected End Date:                       Follow Up Date 3 month follow up    - check blood pressure weekly - write blood pressure results in a log or diary    Why is this important?    You won't feel high blood pressure, but it can still hurt your blood vessels.   High blood pressure can cause heart or kidney problems. It can also cause a stroke.   Making lifestyle changes like losing a little weight or eating less salt will help.   Checking your blood pressure at home and at different times of the day can help to control blood pressure.   If the doctor prescribes medicine remember to take it the way the doctor ordered.   Call the  office if you cannot afford the medicine or if there are questions about it.     Notes:        The patient verbalized understanding of instructions, educational materials, and care plan provided today and agreed to receive a mailed copy of patient instructions, educational materials, and care plan.   Telephone follow up appointment with pharmacy team member scheduled for: 3 months  Junita Push. Kenton Kingfisher PharmD, BCPS Clinical Pharmacist [phone]  Tips for Eating Away From Home If You Have Diabetes Controlling your blood sugar (glucose) levels can be challenging when you do not prepare your own meals. The following tips can help you manage your diabetes when you eat away from home. If you  have questions or if you need help, work with your health care provider or diet and nutrition specialist (dietitian). Planning ahead Plan ahead if you know you will be eating away from home:  Try to eat your meals and snacks at about the same time each day. If you know your meal is going to be later than normal, make sure you have a small snack. Being very hungry can cause you to make unhealthy food choices.  Make a list of restaurants near you that offer healthy choices. If a restaurant has a carry-out menu, take the menu home and plan what you will order ahead of time.  Look up the restaurant you want to eat at online. Many chain and fast-food restaurants list nutritional information online. Use this information to choose the healthiest options and to calculate how many carbohydrates will be in your meal.  Use a carbohydrate-counting book or mobile app to look up the carbohydrate content and serving size of the foods you want to eat. Free foods A "free food" is any food or drink that has less than 5 grams of carbohydrates and less than 20 calories per serving. These food are high in fiber and nutrients and low in calories, carbohydrates, and fats. Free foods include:  Non-starchy vegetables, such as  carrots, broccoli, celery, lettuce, or green beans.  Non-sugar drinks, such as water, unsweetened coffee, or unsweetened tea.  Low-calorie salad dressings.  Sugar-free gelatin. Starting meals with a salad full of vegetables is a healthy choice that includes a lot of free foods. Avoid high-calorie salad toppings like bacon, cheese, and high-fat dressings. Ask for your salad dressing to be served on the side so that you dip your fork in the dressing and then in the salad. This allows you to control how much dressing you eat and still get the flavor with every bite. Choices to control carbohydrates  Ask your server to take away the bread basket or chips from your table.  Choose light yogurt or [place] yogurt instead of non-fat sweetened yogurt.  Order fresh fruit. A salad bar often offers fresh fruit choices. Avoid canned fruit because it is usually packed in sugar or syrup.  Order a salad, and ask for dressing on the side.  Ask for substitutes. For example, if your meal comes with french fries, ask for a side salad or steamed veggies instead. If a meal comes with fried chicken, ask for grilled chicken instead.   Beverages  Choose drinks that are low in calories and sugar, such as: ? Water. ? Unsweetened tea or coffee. ? Lowfat milk.  Avoid the following drinks: ? Alcoholic beverages. ? Regular (not diet) sodas. Other tips  If you take insulin, wait to take your insulin once your food arrives to your table. This will ensure that your insulin and your food are timed correctly.  Become familiar with serving sizes and learn to recognize how many servings are in a portion. Restaurant portions are typically two to three times larger than what you really need.  Ask your server for a to-go box at the beginning of the meal. When your food comes, leave the amount you should have on your plate, and put the rest in the to-go box so that you are not tempted to eat too much.  Consider splitting an  entree with someone and ordering a side salad.  Avoid buffets. They are typically too tempting and result in overeating. Where to find more information  American Diabetes Association: www.diabetes.org  American Association of Diabetes Educators: www.diabeteseducator.org Summary  Plan ahead when eating away from home.  Try to eat your meals and snacks at about the same time each day. If you know your meal is going to be later than normal, make sure you have a small snack. Being very hungry can cause you to make unhealthy food choices.  Ask for substitutes. For example, if your meal comes with french fries, ask for a side salad or steamed veggies instead. If a meal comes with fried chicken, ask for grilled chicken instead.  Ask for a to-go box when you order your meal. Divide your meal before you start eating. This information is not intended to replace advice given to you by your health care provider. Make sure you discuss any questions you have with your health care provider. Document Revised: 12/09/2016 Document Reviewed: 12/09/2016 Elsevier Patient Education  2021 [employer].

## 2021-02-13 DIAGNOSIS — N184 Chronic kidney disease, stage 4 (severe): Secondary | ICD-10-CM | POA: Diagnosis not present

## 2021-02-13 DIAGNOSIS — E119 Type 2 diabetes mellitus without complications: Secondary | ICD-10-CM | POA: Diagnosis not present

## 2021-02-14 ENCOUNTER — Telehealth: Payer: Self-pay | Admitting: Pharmacist

## 2021-02-14 NOTE — Chronic Care Management (AMB) (Signed)
    Chronic Care Management Pharmacy Assistant   Name: Jordan Thornton  MRN: [id number] DOB: 04/23/1930    Reason for Encounter: Disease State Diabetes Mellitus  Recent office visits:  None noted  Recent consult visits:  01/23/2021 Anthonette Legato, MD (Nephrology) Follow up visit. Follow up in 4 months.  Hospital visits:  None in previous 6 months  Medications: Outpatient Encounter Medications as of 02/14/2021  Medication Sig  . Acetaminophen (TYLENOL ARTHRITIS PAIN PO) Take 2 tablets by mouth daily as needed.   Marland Kitchen ADVOCATE LANCETS MISC use once daily as directed to test blood sugar  . Blood Glucose Monitoring Suppl (FREESTYLE LITE) DEVI use once daily as directed to test blood sugar  . Cholecalciferol (VITAMIN D) 2000 units CAPS Take 1 capsule by mouth daily. otc  . Elderberry 575 MG/5ML SYRP Take 1 Dose by mouth daily.   Marland Kitchen FREESTYLE LITE test strip USE STRIP TO CHECK GLUCOSE ONCE DAILY AS DIRECTED  . JANUVIA 25 MG tablet TAKE ONE TABLET BY MOUTH DAILY  . Lancet Devices (ADVOCATE LANCING DEVICE) MISC use once daily as directed to test blood sugar  . lansoprazole (PREVACID) 15 MG capsule Take 1 capsule (15 mg total) by mouth 1 day or 1 dose.  Marland Kitchen lisinopril-hydrochlorothiazide (ZESTORETIC) 20-12.5 MG tablet Take 1 tablet by mouth daily.  Marland Kitchen LORazepam (ATIVAN) 0.5 MG tablet Take 1 tablet (0.5 mg total) by mouth as needed. Per 30 days  . Omega-3 Fatty Acids (FISH OIL) 1000 MG CAPS Take 1 capsule (1,000 mg total) by mouth daily.  . simvastatin (ZOCOR) 20 MG tablet Take 1 tablet (20 mg total) by mouth daily at 6 PM.   No facility-administered encounter medications on file as of 02/14/2021.   Recent Relevant Labs: Lab Results  Component Value Date/Time   HGBA1C 6.6 (H) 10/29/2020 11:10 AM   HGBA1C 6.6 (H) 06/25/2020 10:55 AM    Kidney Function Lab Results  Component Value Date/Time   CREATININE 1.9 (A) 09/24/2020 12:00 AM   CREATININE 1.90 (H) 06/25/2020 10:55 AM   CREATININE  1.66 (H) 10/17/2019 11:10 AM   GFRNONAA 23 (L) 06/25/2020 10:55 AM   GFRAA 27 (L) 06/25/2020 10:55 AM    . Current antihyperglycemic regimen:   Januvia 25 mg qd  . What recent interventions/DTPs have been made to improve glycemic control:  o None noted  . Have there been any recent hospitalizations or ED visits since last visit with CPP? No   . Patient denies hypoglycemic symptoms, including Pale, Sweaty, Shaky, Hungry, Nervous/irritable and Vision changes   . Patient denies hyperglycemic symptoms, including blurry vision, excessive thirst, fatigue, polyuria and weakness   . How often are you checking your blood sugar? once daily   . What are your blood sugars ranging?  o Fasting: 124- 02/14/2021 o Before meals:  o After meals:  o Bedtime:  . During the week, how often does your blood glucose drop below 70? Never   . Are you checking your feet daily/regularly?         Patient states she does check her feet daily.  Adherence Review: Is the patient currently on a STATIN medication? Yes Is the patient currently on ACE/ARB medication? Yes Does the patient have >5 day gap between last estimated fill dates? Yes   Star Rating Drugs: Januvia 25 mg Last filled:06/13/2020 30 DS Lisinopril-Hydrochlorothiazide 20-12.5 mg Last filled:01/02/2021 90DS Simvastatin 20 mg Last filled:01/02/2021 90 DS   Myriam Elta [place], [place]

## 2021-02-17 ENCOUNTER — Ambulatory Visit (INDEPENDENT_AMBULATORY_CARE_PROVIDER_SITE_OTHER): Payer: PPO

## 2021-02-17 ENCOUNTER — Other Ambulatory Visit: Payer: Self-pay

## 2021-02-17 VITALS — BP 130/82 | HR 72 | Temp 97.8°F | Resp 16 | Ht 65.0 in | Wt 141.8 lb

## 2021-02-17 DIAGNOSIS — Z0000 Encounter for general adult medical examination without abnormal findings: Secondary | ICD-10-CM

## 2021-02-17 NOTE — Progress Notes (Signed)
Subjective:   Jordan Thornton is a 87 y.o. female who presents for Medicare Annual (Subsequent) preventive examination.  Review of Systems     Cardiac Risk Factors include: advanced age (>60mn, >>79women);diabetes mellitus;dyslipidemia;hypertension     Objective:    Today's Vitals   02/17/21 0953  BP: 130/82  Pulse: 72  Resp: 16  Temp: 97.8 F (36.6 C)  TempSrc: Oral  SpO2: 99%  Weight: 141 lb 12.8 oz (64.3 kg)  Height: '5\' 5"'$  (1.651 m)   Body mass index is 23.6 kg/m.  Advanced Directives 02/17/2021 02/14/2020 09/19/2019 09/19/2019 09/18/2019 09/16/2019 02/13/2019  Does Patient Have a Medical Advance Directive? Yes Yes Yes - Yes Yes Yes  Type of Advance Directive [place] will Living will Living will Living will Healthcare Power of [place]reat Neck EstatesLiving will  Does patient want to make changes to medical advance directive? - - No - Patient declined - - - -  Copy of [place] Chart? Yes - validated most recent copy scanned in chart (See row information) Yes - validated most recent copy scanned in chart (See row information) - - - - Yes - validated most recent copy scanned in chart (See row information)  Would patient like information on creating a medical advance directive? - - - - - - -    Current Medications (verified) Outpatient Encounter Medications as of 02/17/2021  Medication Sig  . Acetaminophen (TYLENOL ARTHRITIS PAIN PO) Take 2 tablets by mouth daily as needed.   .Marland KitchenADVOCATE LANCETS MISC use once daily as directed to test blood sugar  . Blood Glucose Monitoring Suppl (FREESTYLE LITE) DEVI use once daily as directed to test blood sugar  . Cholecalciferol (VITAMIN D) 2000 units CAPS Take 1 capsule by mouth daily. otc  . Elderberry 575 MG/5ML SYRP Take 1 Dose by mouth daily.   .Marland KitchenFREESTYLE LITE test strip USE STRIP TO CHECK GLUCOSE ONCE DAILY AS DIRECTED  . JANUVIA  25 MG tablet TAKE ONE TABLET BY MOUTH DAILY  . Lancet Devices (ADVOCATE LANCING DEVICE) MISC use once daily as directed to test blood sugar  . lansoprazole (PREVACID) 15 MG capsule Take 1 capsule (15 mg total) by mouth 1 day or 1 dose.  .Marland KitchenLORazepam (ATIVAN) 0.5 MG tablet Take 1 tablet (0.5 mg total) by mouth as needed. Per 30 days  . losartan (COZAAR) 25 MG tablet Take 25 mg by mouth daily.  . Omega-3 Fatty Acids (FISH OIL) 1000 MG CAPS Take 1 capsule (1,000 mg total) by mouth daily.  . simvastatin (ZOCOR) 20 MG tablet Take 1 tablet (20 mg total) by mouth daily at 6 PM.  . [DISCONTINUED] lisinopril-hydrochlorothiazide (ZESTORETIC) 20-12.5 MG tablet Take 1 tablet by mouth daily.   No facility-administered encounter medications on file as of 02/17/2021.    Allergies (verified) Sertraline   History: Past Medical History:  Diagnosis Date  . Diabetes mellitus without complication (HMio   . GERD (gastroesophageal reflux disease)   . Hyperlipidemia   . Hypertension    Past Surgical History:  Procedure Laterality Date  . bladder tack    . BREAST BIOPSY Left 10-15 yrs ago   benign  . CATARACT EXTRACTION Bilateral 05/2015  . CHOLECYSTECTOMY OPEN    . VAGINAL HYSTERECTOMY     Family History  Problem Relation Age of Onset  . Heart disease Maternal Aunt   . Heart disease Maternal Grandmother   . Stroke Father   .  Breast cancer Neg Hx    Social History   Socioeconomic History  . Marital status: Married    Spouse name: Not on file  . Number of children: 2  . Years of education: Not on file  . Highest education level: Bachelor's degree (e.g., BA, AB, BS)  Occupational History    Employer: RETIRED  Tobacco Use  . Smoking status: Former Smoker    Packs/day: 0.25    Years: 25.00    Pack years: 6.25    Types: Cigarettes    Quit date: 1982    Years since quitting: 40.4  . Smokeless tobacco: Never Used  . Tobacco comment: smoking cessation materials not required  Vaping Use  .  Vaping Use: Never used  Substance and Sexual Activity  . Alcohol use: No    Alcohol/week: 0.0 standard drinks  . Drug use: No  . Sexual activity: Not Currently  Other Topics Concern  . Not on file  Social History Narrative  . Not on file   Social Determinants of Health   Financial Resource Strain: Low Risk   . Difficulty of Paying Living Expenses: Not hard at all  Food Insecurity: No Food Insecurity  . Worried About Charity fundraiser in the Last Year: Never true  . Ran Out of Food in the Last Year: Never true  Transportation Needs: No Transportation Needs  . Lack of Transportation (Medical): No  . Lack of Transportation (Non-Medical): No  Physical Activity: Inactive  . Days of Exercise per Week: 0 days  . Minutes of Exercise per Session: 0 min  Stress: No Stress Concern Present  . Feeling of Stress : Only a little  Social Connections: Moderately Integrated  . Frequency of Communication with Friends and Family: More than three times a week  . Frequency of Social Gatherings with Friends and Family: Three times a week  . Attends Religious Services: More than 4 times per year  . Active Member of Clubs or Organizations: No  . Attends Archivist Meetings: Never  . Marital Status: Married    Tobacco Counseling Counseling given: Not Answered Comment: smoking cessation materials not required   Clinical Intake:  Pre-visit preparation completed: Yes  Pain : No/denies pain     BMI - recorded: 23.6 Nutritional Status: BMI of 19-24  Normal Nutritional Risks: None Diabetes: Yes CBG done?: No Did pt. bring in CBG monitor from home?: No  How often do you need to have someone help you when you read instructions, pamphlets, or other written materials from your doctor or pharmacy?: 1 - Never  Nutrition Risk Assessment:  Has the patient had any N/V/D within the last 2 months?  No  Does the patient have any non-healing wounds?  No  Has the patient had any  unintentional weight loss or weight gain?  No   Diabetes:  Is the patient diabetic?  Yes  If diabetic, was a CBG obtained today?  No  Did the patient bring in their glucometer from home?  No  How often do you monitor your CBG's? daily.   Financial Strains and Diabetes Management:  Are you having any financial strains with the device, your supplies or your medication? No .  Does the patient want to be seen by Chronic Care Management for management of their diabetes?  No  Would the patient like to be referred to a Nutritionist or for Diabetic Management?  No   Diabetic Exams:  Diabetic Eye Exam: Completed 09/23/20 negative retinopathy.  Diabetic Foot Exam: Completed 10/29/20.   Interpreter Needed?: No  Information entered by :: Clemetine Marker LPN   Activities of Daily Living In your present state of health, do you have any difficulty performing the following activities: 02/17/2021  Hearing? N  Comment declines hearing aids  Vision? N  Difficulty concentrating or making decisions? N  Walking or climbing stairs? N  Dressing or bathing? N  Doing errands, shopping? N  Preparing Food and eating ? N  Using the Toilet? N  In the past six months, have you accidently leaked urine? N  Do you have problems with loss of bowel control? N  Managing your Medications? N  Managing your Finances? N  Housekeeping or managing your Housekeeping? N  Some recent data might be hidden    Patient Care Team: Juline Patch, MD as PCP - General (Family Medicine) Anthonette Legato, MD as Consulting Physician (Internal Medicine) Vladimir Faster, [facility] (Pharmacist)  Indicate any recent Medical Services you may have received from other than [facility] providers in the past year (date may be approximate).     Assessment:   This is a routine wellness examination for Jordan.  Hearing/Vision screen  Hearing Screening   '125Hz'$  '250Hz'$  '500Hz'$  '1000Hz'$  '2000Hz'$  '3000Hz'$  '4000Hz'$  '6000Hz'$  '8000Hz'$   Right ear:           Left  ear:           Comments: Pt denies hearing difficulty  Vision Screening Comments: [facility]  Dietary issues and exercise activities discussed: Current Exercise Habits: The patient does not participate in regular exercise at present, Exercise limited by: orthopedic condition(s)  Goals Addressed            This Visit's Progress   . DIET - DECREASE SODA OR JUICE INTAKE   On track    Patient will decrease current intake of cranberry juice     . DIET - INCREASE WATER INTAKE   On track    Recommend to drink at least 6-8 8oz glasses of water per day.    . Monitor and Manage My Blood Sugar-Diabetes Type 2   On track    Timeframe:  Long-Range Goal Priority:  High Start Date:                             Expected End Date:                       Follow Up Date 3 month follow up - check blood sugar at prescribed times - check blood sugar if I feel it is too high or too low - enter blood sugar readings and medication or insulin into daily log - take the blood sugar meter to all doctor visits    Why is this important?    Checking your blood sugar at home helps to keep it from getting very high or very low.   Writing the results in a diary or log helps the doctor know how to care for you.   Your blood sugar log should have the time, date and the results.   Also, write down the amount of insulin or other medicine that you take.   Other information, like what you ate, exercise done and how you were feeling, will also be helpful.     Notes:       Depression Screen PHQ 2/9 Scores 02/17/2021 10/29/2020 06/25/2020 06/17/2020  02/23/2020 02/14/2020 01/01/2020  PHQ - 2 Score 0 0 0 0 0 0 1  PHQ- 9 Score - 0 0 0 0 - 1    Fall Risk Fall Risk  02/17/2021 06/25/2020 06/17/2020 02/23/2020 02/14/2020  Falls in the past year? 0 0 0 0 1  Comment - - - - -  Number falls in past yr: 0 - - - 1  Injury with Fall? 0 - - - 0  Comment - - - - -  Risk for fall due to : No  Fall Risks - - - History of fall(s)  Risk for fall due to: Comment - - - - -  Follow up Falls prevention discussed Falls evaluation completed Falls evaluation completed Falls evaluation completed Falls prevention discussed    FALL RISK PREVENTION PERTAINING TO THE HOME:  Any stairs in or around the home? Yes  If so, are there any without handrails? No  Home free of loose throw rugs in walkways, pet beds, electrical cords, etc? Yes  Adequate lighting in your home to reduce risk of falls? Yes   ASSISTIVE DEVICES UTILIZED TO PREVENT FALLS:  Life alert? No  Use of a cane, walker or w/c? No  Grab bars in the bathroom? No  Shower chair or bench in shower? No  Elevated toilet seat or a handicapped toilet? No   TIMED UP AND GO:  Was the test performed? Yes .  Length of time to ambulate 10 feet: 5 sec.   Gait steady and fast without use of assistive device  Cognitive Function: Normal cognitive status assessed by direct observation by this Nurse Health Advisor. No abnormalities found.       6CIT Screen 02/14/2020 02/13/2019 02/09/2018  What Year? 0 points 0 points 0 points  What month? 0 points 0 points 0 points  What time? 0 points 0 points 0 points  Count back from 20 0 points 0 points 0 points  Months in reverse 0 points 0 points 0 points  Repeat phrase 2 points 2 points 2 points  Total Score '2 2 2    '$ Immunizations Immunization History  Administered Date(s) Administered  . Fluad Quad(high Dose 65+) 05/04/2019  . Influenza, High Dose Seasonal PF 06/19/2017, 05/23/2018  . Influenza-Unspecified 06/19/2017, 05/23/2018, 05/29/2020  . PFIZER(Purple Top)SARS-COV-2 Vaccination 11/10/2019, 12/06/2019, 06/12/2020  . Pneumococcal Conjugate-13 05/23/2015  . Pneumococcal Polysaccharide-23 09/14/2008  . Td 03/14/2006  . Tdap 05/23/2015  . Zoster Recombinat (Shingrix) 06/10/2018, 08/13/2018    TDAP status: Up to date  Flu Vaccine status: Up to date  Pneumococcal vaccine status: Up to  date  Covid-19 vaccine status: Completed vaccines  Qualifies for Shingles Vaccine? Yes   Zostavax completed Yes   Shingrix Completed?: Yes  Screening Tests Health Maintenance  Topic Date Due  . INFLUENZA VACCINE  04/14/2021  . HEMOGLOBIN A1C  04/28/2021  . MAMMOGRAM  09/02/2021  . OPHTHALMOLOGY EXAM  09/23/2021  . FOOT EXAM  10/29/2021  . TETANUS/TDAP  05/22/2025  . Pneumococcal Vaccine 38-66 Years old  Completed  . DEXA SCAN  Completed  . COVID-19 Vaccine  Completed  . PNA vac Low Risk Adult  Completed  . Zoster Vaccines- Shingrix  Completed  . HPV VACCINES  Aged Out    Health Maintenance  There are no preventive care reminders to display for this patient.  Colorectal cancer screening: No longer required.   Mammogram status: Completed 09/02/20. Repeat every year  Bone Density status: Completed 2010. Results reflect: Bone density results: OSTEOPENIA. Repeat  every 2 years. pt declines repeat screening at this time.   Lung Cancer Screening: (Low Dose CT Chest recommended if Age 95-80 years, 30 pack-year currently smoking OR have quit w/in 15years.) does not qualify.   Additional Screening:  Hepatitis C Screening: does not qualify.   Vision Screening: Recommended annual ophthalmology exams for early detection of glaucoma and other disorders of the eye. Is the patient up to date with their annual eye exam?  Yes  Who is the provider or what is the name of the office in which the patient attends annual eye exams? [facility]   Dental Screening: Recommended annual dental exams for proper oral hygiene  Community Resource Referral / Chronic Care Management: CRR required this visit?  No   CCM required this visit?  No      Plan:     I have personally reviewed and noted the following in the patient's chart:   . Medical and social history . Use of alcohol, tobacco or illicit drugs  . Current medications and supplements including opioid prescriptions.   . Functional ability and status . Nutritional status . Physical activity . Advanced directives . List of other physicians . Hospitalizations, surgeries, and ER visits in previous 12 months . Vitals . Screenings to include cognitive, depression, and falls . Referrals and appointments  In addition, I have reviewed and discussed with patient certain preventive protocols, quality metrics, and best practice recommendations. A written personalized care plan for preventive services as well as general preventive health recommendations were provided to patient.     Clemetine Marker, LPN   [id number]   Nurse Notes: none

## 2021-02-17 NOTE — Patient Instructions (Signed)
Jordan Thornton , Thank you for taking time to come for your Medicare Wellness Visit. I appreciate your ongoing commitment to your health goals. Please review the following plan we discussed and let me know if I can assist you in the future.   Screening recommendations/referrals: Colonoscopy: no longer required Mammogram: done 09/02/20 Bone Density: no longer required Recommended yearly ophthalmology/optometry visit for glaucoma screening and checkup Recommended yearly dental visit for hygiene and checkup  Vaccinations: Influenza vaccine: done 05/29/20 Pneumococcal vaccine: done 05/23/15 Tdap vaccine: done 05/23/15 Shingles vaccine: done 06/10/18 & 08/13/18   Covid-19:done 11/10/19, 12/06/19 & 06/12/20  Conditions/risks identified: Keep up the great work!  Next appointment: Follow up in one year for your annual wellness visit    Preventive Care 65 Years and Older, Female Preventive care refers to lifestyle choices and visits with your health care provider that can promote health and wellness. What does preventive care include?  A yearly physical exam. This is also called an annual well check.  Dental exams once or twice a year.  Routine eye exams. Ask your health care provider how often you should have your eyes checked.  Personal lifestyle choices, including:  Daily care of your teeth and gums.  Regular physical activity.  Eating a healthy diet.  Avoiding tobacco and drug use.  Limiting alcohol use.  Practicing safe sex.  Taking low-dose aspirin every day.  Taking vitamin and mineral supplements as recommended by your health care provider. What happens during an annual well check? The services and screenings done by your health care provider during your annual well check will depend on your age, overall health, lifestyle risk factors, and family history of disease. Counseling  Your health care provider may ask you questions about your:  Alcohol use.  Tobacco use.  Drug  use.  Emotional well-being.  Home and relationship well-being.  Sexual activity.  Eating habits.  History of falls.  Memory and ability to understand (cognition).  Work and work Statistician.  Reproductive health. Screening  You may have the following tests or measurements:  Height, weight, and BMI.  Blood pressure.  Lipid and cholesterol levels. These may be checked every 5 years, or more frequently if you are over 32 years old.  Skin check.  Lung cancer screening. You may have this screening every year starting at age 85 if you have a 30-pack-year history of smoking and currently smoke or have quit within the past 15 years.  Fecal occult blood test (FOBT) of the stool. You may have this test every year starting at age 85.  Flexible sigmoidoscopy or colonoscopy. You may have a sigmoidoscopy every 5 years or a colonoscopy every 10 years starting at age 56.  Hepatitis C blood test.  Hepatitis B blood test.  Sexually transmitted disease (STD) testing.  Diabetes screening. This is done by checking your blood sugar (glucose) after you have not eaten for a while (fasting). You may have this done every 1-3 years.  Bone density scan. This is done to screen for osteoporosis. You may have this done starting at age 85.  Mammogram. This may be done every 1-2 years. Talk to your health care provider about how often you should have regular mammograms. Talk with your health care provider about your test results, treatment options, and if necessary, the need for more tests. Vaccines  Your health care provider may recommend certain vaccines, such as:  Influenza vaccine. This is recommended every year.  Tetanus, diphtheria, and acellular pertussis (Tdap, Td) vaccine.  You may need a Td booster every 10 years.  Zoster vaccine. You may need this after age 85.  Pneumococcal 13-valent conjugate (PCV13) vaccine. One dose is recommended after age 85.  Pneumococcal polysaccharide  (PPSV23) vaccine. One dose is recommended after age 85. Talk to your health care provider about which screenings and vaccines you need and how often you need them. This information is not intended to replace advice given to you by your health care provider. Make sure you discuss any questions you have with your health care provider. Document Released: 09/27/2015 Document Revised: 05/20/2016 Document Reviewed: 07/02/2015 Elsevier Interactive Patient Education  2017 [place] Prevention in the Home Falls can cause injuries. They can happen to people of all ages. There are many things you can do to make your home safe and to help prevent falls. What can I do on the outside of my home?  Regularly fix the edges of walkways and driveways and fix any cracks.  Remove anything that might make you trip as you walk through a door, such as a raised step or threshold.  Trim any bushes or trees on the path to your home.  Use bright outdoor lighting.  Clear any walking paths of anything that might make someone trip, such as rocks or tools.  Regularly check to see if handrails are loose or broken. Make sure that both sides of any steps have handrails.  Any raised decks and porches should have guardrails on the edges.  Have any leaves, snow, or ice cleared regularly.  Use sand or salt on walking paths during winter.  Clean up any spills in your garage right away. This includes oil or grease spills. What can I do in the bathroom?  Use night lights.  Install grab bars by the toilet and in the tub and shower. Do not use towel bars as grab bars.  Use non-skid mats or decals in the tub or shower.  If you need to sit down in the shower, use a plastic, non-slip stool.  Keep the floor dry. Clean up any water that spills on the floor as soon as it happens.  Remove soap buildup in the tub or shower regularly.  Attach bath mats securely with double-sided non-slip rug tape.  Do not have  throw rugs and other things on the floor that can make you trip. What can I do in the bedroom?  Use night lights.  Make sure that you have a light by your bed that is easy to reach.  Do not use any sheets or blankets that are too big for your bed. They should not hang down onto the floor.  Have a firm chair that has side arms. You can use this for support while you get dressed.  Do not have throw rugs and other things on the floor that can make you trip. What can I do in the kitchen?  Clean up any spills right away.  Avoid walking on wet floors.  Keep items that you use a lot in easy-to-reach places.  If you need to reach something above you, use a strong step stool that has a grab bar.  Keep electrical cords out of the way.  Do not use floor polish or wax that makes floors slippery. If you must use wax, use non-skid floor wax.  Do not have throw rugs and other things on the floor that can make you trip. What can I do with my stairs?  Do not leave any  items on the stairs.  Make sure that there are handrails on both sides of the stairs and use them. Fix handrails that are broken or loose. Make sure that handrails are as long as the stairways.  Check any carpeting to make sure that it is firmly attached to the stairs. Fix any carpet that is loose or worn.  Avoid having throw rugs at the top or bottom of the stairs. If you do have throw rugs, attach them to the floor with carpet tape.  Make sure that you have a light switch at the top of the stairs and the bottom of the stairs. If you do not have them, ask someone to add them for you. What else can I do to help prevent falls?  Wear shoes that:  Do not have high heels.  Have rubber bottoms.  Are comfortable and fit you well.  Are closed at the toe. Do not wear sandals.  If you use a stepladder:  Make sure that it is fully opened. Do not climb a closed stepladder.  Make sure that both sides of the stepladder are  locked into place.  Ask someone to hold it for you, if possible.  Clearly mark and make sure that you can see:  Any grab bars or handrails.  First and last steps.  Where the edge of each step is.  Use tools that help you move around (mobility aids) if they are needed. These include:  Canes.  Walkers.  Scooters.  Crutches.  Turn on the lights when you go into a dark area. Replace any light bulbs as soon as they burn out.  Set up your furniture so you have a clear path. Avoid moving your furniture around.  If any of your floors are uneven, fix them.  If there are any pets around you, be aware of where they are.  Review your medicines with your doctor. Some medicines can make you feel dizzy. This can increase your chance of falling. Ask your doctor what other things that you can do to help prevent falls. This information is not intended to replace advice given to you by your health care provider. Make sure you discuss any questions you have with your health care provider. Document Released: 06/27/2009 Document Revised: 02/06/2016 Document Reviewed: 10/05/2014 Elsevier Interactive Patient Education  2017 [employer].

## 2021-02-24 ENCOUNTER — Other Ambulatory Visit: Payer: Self-pay | Admitting: Family Medicine

## 2021-02-24 DIAGNOSIS — E119 Type 2 diabetes mellitus without complications: Secondary | ICD-10-CM

## 2021-02-27 ENCOUNTER — Other Ambulatory Visit: Payer: Self-pay

## 2021-02-27 ENCOUNTER — Ambulatory Visit (INDEPENDENT_AMBULATORY_CARE_PROVIDER_SITE_OTHER): Payer: PPO | Admitting: Family Medicine

## 2021-02-27 ENCOUNTER — Encounter: Payer: Self-pay | Admitting: Family Medicine

## 2021-02-27 VITALS — BP 132/82 | HR 72 | Ht 65.0 in | Wt 142.0 lb

## 2021-02-27 DIAGNOSIS — E119 Type 2 diabetes mellitus without complications: Secondary | ICD-10-CM | POA: Diagnosis not present

## 2021-02-27 MED ORDER — JANUVIA 25 MG PO TABS: 25.0000 mg | ORAL_TABLET | Freq: Every day | ORAL | 1 refills | Status: DC

## 2021-02-27 NOTE — Progress Notes (Signed)
Date:  02/27/2021    Name:  Jordan Thornton   DOB:  10-18-1929   MRN:  [id number]   Chief Complaint: Diabetes  Diabetes She presents for her follow-up diabetic visit. She has type 2 diabetes mellitus. Her disease course has been stable. There are no hypoglycemic associated symptoms. Pertinent negatives for hypoglycemia include no dizziness, headaches or nervousness/anxiousness. There are no diabetic associated symptoms. Pertinent negatives for diabetes include no chest pain and no polydipsia. There are no hypoglycemic complications. Symptoms are stable. There are no diabetic complications. Pertinent negatives for diabetic complications include no autonomic neuropathy, CVA, heart disease, impotence, nephropathy, peripheral neuropathy, PVD or retinopathy. Current diabetic treatment includes oral agent (monotherapy). She is compliant with treatment all of the time. She is following a generally healthy diet. Meal planning includes avoidance of concentrated sweets and carbohydrate counting. Her breakfast blood glucose is taken between 8-9 am. Her breakfast blood glucose range is generally 90-110 mg/dl. An ACE inhibitor/angiotensin II receptor blocker is being taken.   Lab Results  Component Value Date   CREATININE 1.9 (A) 09/24/2020   BUN 30 (H) 06/25/2020   NA 143 06/25/2020   K 5.5 (H) 06/25/2020   CL 105 06/25/2020   CO2 23 06/25/2020   Lab Results  Component Value Date   CHOL 180 10/29/2020   HDL 67 10/29/2020   LDLCALC 97 10/29/2020   TRIG 90 10/29/2020   CHOLHDL 2.8 03/02/2018   No results found for: TSH Lab Results  Component Value Date   HGBA1C 6.6 (H) 10/29/2020   Lab Results  Component Value Date   WBC 5.6 10/17/2019   HGB 9.9 (L) 10/17/2019   HCT 30.0 (L) 10/17/2019   MCV 79 10/17/2019   PLT 200 10/17/2019   Lab Results  Component Value Date   ALT 19 09/21/2019   AST 30 09/21/2019   ALKPHOS 35 (L) 09/21/2019   BILITOT 0.4 09/21/2019     Review of  Systems  Constitutional:  Negative for chills and fever.  HENT:  Negative for drooling, ear discharge, ear pain and sore throat.   Respiratory:  Negative for cough, shortness of breath and wheezing.   Cardiovascular:  Negative for chest pain, palpitations and leg swelling.  Gastrointestinal:  Negative for abdominal pain, blood in stool, constipation, diarrhea and nausea.  Endocrine: Negative for polydipsia.  Genitourinary:  Negative for dysuria, frequency, hematuria, impotence and urgency.  Musculoskeletal:  Negative for back pain, myalgias and neck pain.  Skin:  Negative for rash.  Allergic/Immunologic: Negative for environmental allergies.  Neurological:  Negative for dizziness and headaches.  Hematological:  Does not bruise/bleed easily.  Psychiatric/Behavioral:  Negative for suicidal ideas. The patient is not nervous/anxious.    Patient Active Problem List   Diagnosis Date Noted   H/O total hysterectomy with bilateral salpingo-oophorectomy (BSO) 10/29/2020   Anxiety 10/29/2020   Acute hypoxemic respiratory failure due to COVID-19 ([place]) 09/19/2019   Stage 3 chronic kidney disease ([place]) 09/02/2019   Diabetes mellitus with no complication ([place]) 0000000   Familial multiple lipoprotein-type hyperlipidemia 01/03/2015   Creatinine elevation 01/03/2015   Diabetes ([place]) 12/15/2014   Hyperlipidemia 12/15/2014   Hypertension 12/15/2014   Gastro-esophageal reflux disease without esophagitis 12/15/2014    Allergies  Allergen Reactions   Sertraline     Made her more depressed    Past Surgical History:  Procedure Laterality Date   bladder tack     BREAST BIOPSY Left 10-15 yrs ago   benign   CATARACT  EXTRACTION Bilateral 05/2015   CHOLECYSTECTOMY OPEN     VAGINAL HYSTERECTOMY      Social History   Tobacco Use   Smoking status: Former    Packs/day: 0.25    Years: 25.00    Pack years: 6.25    Types: Cigarettes    Quit date: 1982    Years since quitting: 40.4   Smokeless  tobacco: Never   Tobacco comments:    smoking cessation materials not required  Vaping Use   Vaping Use: Never used  Substance Use Topics   Alcohol use: No    Alcohol/week: 0.0 standard drinks   Drug use: No     Medication list has been reviewed and updated.  Current Meds  Medication Sig   Acetaminophen (TYLENOL ARTHRITIS PAIN PO) Take 2 tablets by mouth daily as needed.    ADVOCATE LANCETS MISC use once daily as directed to test blood sugar   Blood Glucose Monitoring Suppl (FREESTYLE LITE) DEVI use once daily as directed to test blood sugar   Cholecalciferol (VITAMIN D) 2000 units CAPS Take 1 capsule by mouth daily. otc   Elderberry 575 MG/5ML SYRP Take 1 Dose by mouth daily.    glucose blood (FREESTYLE LITE) test strip USE 1 STRIP TO CHECK GLUCOSE ONCE DAILY AS DIRECTED   JANUVIA 25 MG tablet TAKE ONE TABLET BY MOUTH DAILY   Lancet Devices (ADVOCATE LANCING DEVICE) MISC use once daily as directed to test blood sugar   lansoprazole (PREVACID) 15 MG capsule Take 1 capsule (15 mg total) by mouth 1 day or 1 dose.   LORazepam (ATIVAN) 0.5 MG tablet Take 1 tablet (0.5 mg total) by mouth as needed. Per 30 days   losartan (COZAAR) 25 MG tablet Take 25 mg by mouth daily.   Omega-3 Fatty Acids (FISH OIL) 1000 MG CAPS Take 1 capsule (1,000 mg total) by mouth daily.   simvastatin (ZOCOR) 20 MG tablet Take 1 tablet (20 mg total) by mouth daily at 6 PM.    PHQ 2/9 Scores 02/27/2021 02/17/2021 10/29/2020 06/25/2020  PHQ - 2 Score 1 0 0 0  PHQ- 9 Score 1 - 0 0    GAD 7 : Generalized Anxiety Score 02/27/2021 10/29/2020 06/25/2020 06/17/2020  Nervous, Anxious, on Edge 0 0 0 0  Control/stop worrying 1 0 0 0  Worry too much - different things 1 0 0 0  Trouble relaxing 0 0 0 0  Restless 0 0 0 0  Easily annoyed or irritable 0 0 0 0  Afraid - awful might happen 0 0 0 0  Total GAD 7 Score 2 0 0 0  Anxiety Difficulty Not difficult at all - - -    BP Readings from Last 3 Encounters:  02/27/21  132/82  02/17/21 130/82  10/29/20 120/80    Physical Exam Vitals and nursing note reviewed.  Constitutional:      General: She is not in acute distress.    Appearance: She is not diaphoretic.  HENT:     Head: Normocephalic and atraumatic.     Right Ear: Tympanic membrane, ear canal and external ear normal. There is no impacted cerumen.     Left Ear: Tympanic membrane, ear canal and external ear normal. There is no impacted cerumen.     Nose: Nose normal. No congestion.     Mouth/Throat:     Mouth: Mucous membranes are moist.  Eyes:     General:        Right eye: No discharge.  Left eye: No discharge.     Conjunctiva/sclera: Conjunctivae normal.     Pupils: Pupils are equal, round, and reactive to light.  Neck:     Thyroid: No thyromegaly.     Vascular: No JVD.  Cardiovascular:     Rate and Rhythm: Normal rate and regular rhythm.     Heart sounds: Normal heart sounds. No murmur heard.   No friction rub. No gallop.  Pulmonary:     Effort: Pulmonary effort is normal.     Breath sounds: Normal breath sounds. No wheezing or rhonchi.  Abdominal:     General: Bowel sounds are normal.     Palpations: Abdomen is soft. There is no mass.     Tenderness: There is no abdominal tenderness. There is no guarding or rebound.  Musculoskeletal:        General: Normal range of motion.     Cervical back: Normal range of motion and neck supple.  Lymphadenopathy:     Cervical: No cervical adenopathy.  Skin:    General: Skin is warm and dry.  Neurological:     Mental Status: She is alert.     Deep Tendon Reflexes: Reflexes are normal and symmetric.    Wt Readings from Last 3 Encounters:  02/27/21 142 lb (64.4 kg)  02/17/21 141 lb 12.8 oz (64.3 kg)  10/29/20 141 lb (64 kg)    BP 132/82   Pulse 72   Ht '5\' 5"'$  (1.651 m)   Wt 142 lb (64.4 kg)   BMI 23.63 kg/m   Assessment and Plan:  1. Type 2 diabetes mellitus without complication, without long-term current use of insulin  (HCC) Chronic.  Controlled.  Stable.  Patient is tolerating current regimen of Januvia 25 mg once a day and this will be continued at current dosing.  Will check A1c for current level of control - HgB A1c - JANUVIA 25 MG tablet; Take 1 tablet (25 mg total) by mouth daily.  Dispense: 90 tablet; Refill: 1

## 2021-02-28 LAB — HEMOGLOBIN A1C
Est. average glucose Bld gHb Est-mCnc: 137 mg/dL
Hgb A1c MFr Bld: 6.4 % — ABNORMAL HIGH (ref 4.8–5.6)

## 2021-03-18 ENCOUNTER — Ambulatory Visit (INDEPENDENT_AMBULATORY_CARE_PROVIDER_SITE_OTHER): Payer: PPO | Admitting: Pharmacist

## 2021-03-18 DIAGNOSIS — I10 Essential (primary) hypertension: Secondary | ICD-10-CM | POA: Diagnosis not present

## 2021-03-18 DIAGNOSIS — E119 Type 2 diabetes mellitus without complications: Secondary | ICD-10-CM

## 2021-03-18 NOTE — Patient Instructions (Signed)
Visit Information  It was a pleasure speaking with you today. Thank you for letting me be part of your clinical team. Please call with any questions or concerns.    Goals Addressed             This Visit's Progress    Follow My Treatment Plan-Chronic Kidney       Timeframe:  Long-Range Goal Priority:  High Start Date:                             Expected End Date:                       Follow Up Date 3 month follow up   - ask for help if I can't afford my medicines - call for medicine refill 2 or 3 days before it runs out - call the doctor or nurse to get help with side effects - keep follow-up appointments - keep taking my medicines, even when I feel good    Why is this important?   Staying as healthy as you can is very important. This may mean making changes if you smoke, don't exercise or eat poorly.  A healthy lifestyle is an important goal for you.  Following the treatment plan and making changes may be hard.  Try some of these steps to help keep the disease from getting worse.     Notes:       Monitor and Manage My Blood Sugar-Diabetes Type 2   On track    Timeframe:  Long-Range Goal Priority:  High Start Date:                             Expected End Date:                       Follow Up Date 3 month follow up - check blood sugar at prescribed times - check blood sugar if I feel it is too high or too low - enter blood sugar readings and medication or insulin into daily log - take the blood sugar meter to all doctor visits    Why is this important?   Checking your blood sugar at home helps to keep it from getting very high or very low.  Writing the results in a diary or log helps the doctor know how to care for you.  Your blood sugar log should have the time, date and the results.  Also, write down the amount of insulin or other medicine that you take.  Other information, like what you ate, exercise done and how you were feeling, will also be helpful.      Notes:       COMPLETED: Pharmacy Care Plan-see new plan       CARE PLAN ENTRY (see longitudinal plan of care for additional care plan information)  Current Barriers:  Chronic Disease Management support, education, and care coordination needs related to Hypertension, Hyperlipidemia, Diabetes, GERD, and Chronic Kidney Disease   Hypertension BP Readings from Last 3 Encounters:  06/25/20 130/80  06/17/20 130/80  02/23/20 130/80  Pharmacist Clinical Goal(s): Over the next 90 days, patient will work with PharmD and providers to maintain BP goal <130/80 Current regimen:  Lisinopril/hctz 20/12.'5mg'$  daily Interventions: Provided diet and exercise counseling. Encouraged patient to continue checking and documenting at home 2-3 times weekly. Advised  patient to bring cuff with her to next appt for comparison. Patient self care activities - Over the next 60 days, patient will: Check BP 2-3 times weekly, document, and provide at future appointments Ensure daily salt intake < 2300 mg/day Continue following with Nephrology  Hyperlipidemia Lab Results  Component Value Date/Time   LDLCALC 117 (H) 11/17/2019 10:01 AM  Pharmacist Clinical Goal(s): Over the next 60 days, patient will work with PharmD and providers to achieve LDL goal < 70 Current regimen:  Simvastatin 20 mg qpm Interventions: Provided diet and exercise counseling. Patient self care activities - Over the next 60 days, patient will: Continue eating a healthy diet and maintain activity level Attend all scheduled appointments   Diabetes Lab Results  Component Value Date/Time   HGBA1C 6.6 (H) 06/25/2020 10:55 AM   HGBA1C 6.6 (H) 02/23/2020 11:05 AM  Pharmacist Clinical Goal(s): Over the next 60 days, patient will work with PharmD and providers to maintain A1c goal <7% Current regimen:  Januvia 25 mg qd  (begin Tradjenta 5 mg samples after depletion of current supply) Interventions: Called [employer] to assess status of PAP  application and ask for 25 mg Januvia samples or emergency voucher. Neither available. Collaborated with PCP tp provide Tradjenta 5 mg samples while awaiting PAP approval. Provided diet and exercise counseling. Patient self care activities - Over the next 60 days, patient will: Check blood sugar once daily, document, and provide at future appointments Contact provider with any episodes of hypoglycemia Contact PharmD or PCP with questions or concerns Complete and return [employer] attestation form when received and notify PharmD  Medication management Pharmacist Clinical Goal(s): Over the next 60 days, patient will work with PharmD and providers to achieve optimal medication adherence Current pharmacy: [facility] Interventions Comprehensive medication review performed. Continue current medication management strategy Patient self care activities - Over the next 60 days, patient will: Focus on medication adherence by fill dates Take medications as prescribed Report any questions or concerns to PharmD and/or provider(s)  Initial goal documentation       Track and Manage My Blood Pressure-Hypertension   On track    Timeframe:  Long-Range Goal Priority:  Medium Start Date:                             Expected End Date:                       Follow Up Date 3 month follow up    - check blood pressure weekly - write blood pressure results in a log or diary    Why is this important?   You won't feel high blood pressure, but it can still hurt your blood vessels.  High blood pressure can cause heart or kidney problems. It can also cause a stroke.  Making lifestyle changes like losing a little weight or eating less salt will help.  Checking your blood pressure at home and at different times of the day can help to control blood pressure.  If the doctor prescribes medicine remember to take it the way the doctor ordered.  Call the office if you cannot afford the medicine or if there are questions  about it.     Notes:          Patient verbalizes understanding of instructions provided today and agrees to view in [place].   Telephone follow up appointment with pharmacy team member scheduled for: 1 month  Engineer, maintenance (IT), PharmD 2 months   Junita Push. Kenton Kingfisher PharmD, [place] Clinical Pharmacist [phone]

## 2021-03-18 NOTE — Progress Notes (Signed)
,   Chronic Care Management Pharmacy Note  03/18/2021 Name:  Jordan Thornton MRN:  [id number] DOB:  July 20, 1930  Subjective: Jordan Thornton is an 87 y.o. year old female who is a primary patient of Juline Patch, MD.  The CCM team was consulted for assistance with disease management and care coordination needs.    Engaged with patient by telephone for follow up visit in response to provider referral for pharmacy case management and/or care coordination services.   Consent to Services:  The patient was given information about Chronic Care Management services, agreed to services, and gave verbal consent prior to initiation of services.  Please see initial visit note for detailed documentation.   Patient Care Team: Juline Patch, MD as PCP - General (Family Medicine) Anthonette Legato, MD as Consulting Physician (Internal Medicine) Vladimir Faster, [facility] (Pharmacist)  Recent office visits: 6/16/22Ronnald Ramp (PCP)- A1c 6.4 6/606/22- [place]hus (wellness visit) 2/15/22Ronnald Ramp (PCP)- blood work, no med changes  Recent consult visits: 6/02/22Ernestine Conrad  (nephrology)- repeat labs Scr 2.16, egfr `36m/min  01/23/21-Lateef (nephro)- CKD stave IV, cont lisinopril, monitor phos, Scr 2.47 gfr 17 5/26 changed lis/hctz to losartan?  Hospital visits: None in previous 6 months  Objective:  Lab Results  Component Value Date   CREATININE 1.9 (A) 09/24/2020   BUN 30 (H) 06/25/2020   GFRNONAA 23 (L) 06/25/2020   GFRAA 27 (L) 06/25/2020   NA 143 06/25/2020   K 5.5 (H) 06/25/2020   CALCIUM 10.5 (H) 06/25/2020   CO2 23 06/25/2020   GLUCOSE 128 (H) 06/25/2020    Lab Results  Component Value Date/Time   HGBA1C 6.4 (H) 02/27/2021 11:07 AM   HGBA1C 6.6 (H) 10/29/2020 11:10 AM    Last diabetic Eye exam:  Lab Results  Component Value Date/Time   HMDIABEYEEXA No Retinopathy 09/23/2020 12:00 AM    Last diabetic Foot exam: No results found for: HMDIABFOOTEX   Lab Results  Component Value  Date   CHOL 180 10/29/2020   HDL 67 10/29/2020   LDLCALC 97 10/29/2020   TRIG 90 10/29/2020   CHOLHDL 2.8 03/02/2018    Hepatic Function Latest Ref Rng & Units 06/25/2020 10/17/2019 09/21/2019  Total Protein 6.5 - 8.1 g/dL - - 5.9(L)  Albumin 3.6 - 4.6 g/dL 4.7(H) 3.7 2.8(L)  AST 15 - 41 U/L - - 30  ALT 0 - 44 U/L - - 19  Alk Phosphatase 38 - 126 U/L - - 35(L)  Total Bilirubin 0.3 - 1.2 mg/dL - - 0.4  Bilirubin, Direct 0.00 - 0.40 mg/dL - - -    No results found for: TSH, FREET4  CBC Latest Ref Rng & Units 10/17/2019 10/09/2019 09/21/2019  WBC 3.4 - 10.8 x10E3/uL 5.6 9.4 9.0  Hemoglobin 11.1 - 15.9 g/dL 9.9(L) 10.6(L) 10.1(L)  Hematocrit 34.0 - 46.6 % 30.0(L) 31.7(L) 30.5(L)  Platelets 150 - 450 x10E3/uL 200 85(LL) 149(L)    No results found for: VD25OH  Clinical ASCVD: No  The ASCVD Risk score (Mikey BussingDC Jr., et al., 2013) failed to calculate for the following reasons:   The 2013 ASCVD risk score is only valid for ages 433to 779   Depression screen PHQ 2/9 02/27/2021 02/17/2021 10/29/2020  Decreased Interest 0 0 0  Down, Depressed, Hopeless 1 0 0  PHQ - 2 Score 1 0 0  Altered sleeping 0 - 0  Tired, decreased energy 0 - 0  Change in appetite 0 - 0  Feeling bad or failure  about yourself  0 - 0  Trouble concentrating 0 - 0  Moving slowly or fidgety/restless 0 - 0  Suicidal thoughts 0 - 0  PHQ-9 Score 1 - 0  Difficult doing work/chores Somewhat difficult - -  Some recent data might be hidden      Social History   Tobacco Use  Smoking Status Former   Packs/day: 0.25   Years: 25.00   Pack years: 6.25   Types: Cigarettes   Quit date: 1982   Years since quitting: 40.5  Smokeless Tobacco Never  Tobacco Comments   smoking cessation materials not required   BP Readings from Last 3 Encounters:  02/27/21 132/82  02/17/21 130/82  10/29/20 120/80   Pulse Readings from Last 3 Encounters:  02/27/21 72  02/17/21 72  10/29/20 72   Wt Readings from Last 3 Encounters:   02/27/21 142 lb (64.4 kg)  02/17/21 141 lb 12.8 oz (64.3 kg)  10/29/20 141 lb (64 kg)   BMI Readings from Last 3 Encounters:  02/27/21 23.63 kg/m  02/17/21 23.60 kg/m  10/29/20 23.46 kg/m    Assessment/Interventions: Review of patient past medical history, allergies, medications, health status, including review of consultants reports, laboratory and other test data, was performed as part of comprehensive evaluation and provision of chronic care management services.   SDOH:  (Social Determinants of Health) assessments and interventions performed: No  SDOH Screenings   Alcohol Screen: Low Risk    Last Alcohol Screening Score (AUDIT): 0  Depression (PHQ2-9): Low Risk    PHQ-2 Score: 1  Financial Resource Strain: Low Risk    Difficulty of Paying Living Expenses: Not hard at all  Food Insecurity: No Food Insecurity   Worried About Charity fundraiser in the Last Year: Never true   Ran Out of Food in the Last Year: Never true  Housing: Low Risk    Last Housing Risk Score: 0  Physical Activity: Inactive   Days of Exercise per Week: 0 days   Minutes of Exercise per Session: 0 min  Social Connections: Moderately Integrated   Frequency of Communication with Friends and Family: More than three times a week   Frequency of Social Gatherings with Friends and Family: Three times a week   Attends Religious Services: More than 4 times per year   Active Member of Clubs or Organizations: No   Attends Archivist Meetings: Never   Marital Status: Married  Stress: No Stress Concern Present   Feeling of Stress : Only a little  Tobacco Use: Medium Risk   Smoking Tobacco Use: Former   Smokeless Tobacco Use: Never  Transportation Needs: No Data processing manager (Medical): No   Lack of Transportation (Non-Medical): No      Immunization History  Administered Date(s) Administered   Fluad Quad(high Dose 65+) 05/04/2019   Influenza, High Dose Seasonal PF  06/19/2017, 05/23/2018   Influenza-Unspecified 06/19/2017, 05/23/2018, 05/29/2020   PFIZER(Purple Top)SARS-COV-2 Vaccination 11/10/2019, 12/06/2019, 06/12/2020   Pneumococcal Conjugate-13 05/23/2015   Pneumococcal Polysaccharide-23 09/14/2008   Td 03/14/2006   Tdap 05/23/2015   Zoster Recombinat (Shingrix) 06/10/2018, 08/13/2018    Conditions to be addressed/monitored:  Hypertension, Hyperlipidemia, Diabetes, GERD, Chronic Kidney Disease and Anxiety  There are no care plans that you recently modified to display for this patient.    Medication Assistance:  Januviaobtained through [employer] medication assistance program.  Enrollment ends 09/13/21  Patient's preferred pharmacy is:  [facility] [address], [place] - [place]  [place] [address] Phone: [phone] Fax: [phone]  [place], [place] [address] [place] [address] Phone: [phone] Fax: [phone]  [place], [place] [address] [place] [address] Phone: [phone] Fax: [phone]  Uses pill box? Yes Pt endorses 95% compliance  We discussed: Benefits of medication synchronization, packaging and delivery as well as enhanced pharmacist oversight with [employer]. Patient decided to: Continue current medication management strategy  Care Plan and Follow Up Patient Decision:  Patient agrees to Care Plan and Follow-up.  Plan: Telephone follow up appointment with care management team member scheduled for:  1 month CPa-  3 months PharmD  Junita Push. Kenton Kingfisher PharmD, [facility] [phone]

## 2021-04-29 ENCOUNTER — Other Ambulatory Visit: Payer: Self-pay | Admitting: Family Medicine

## 2021-04-29 ENCOUNTER — Telehealth: Payer: Self-pay

## 2021-04-29 DIAGNOSIS — F419 Anxiety disorder, unspecified: Secondary | ICD-10-CM

## 2021-04-29 NOTE — Telephone Encounter (Signed)
Pharmacy called and wanted pt's address, phone number, our address to confirm for controlled Ativan

## 2021-05-06 ENCOUNTER — Telehealth: Payer: Self-pay

## 2021-05-06 NOTE — Progress Notes (Signed)
    Chronic Care Management Pharmacy Assistant   Name: Jordan Thornton  MRN: [id number] DOB: June 04, 1930   Reason for Encounter: General Assessment    Recent office visits:  None noted   Recent consult visits:  None noted   Hospital visits:  None in previous 6 months  Medications: Outpatient Encounter Medications as of 05/06/2021  Medication Sig   Acetaminophen (TYLENOL ARTHRITIS PAIN PO) Take 2 tablets by mouth daily as needed.    ADVOCATE LANCETS MISC use once daily as directed to test blood sugar   Blood Glucose Monitoring Suppl (FREESTYLE LITE) DEVI use once daily as directed to test blood sugar   Cholecalciferol (VITAMIN D) 2000 units CAPS Take 1 capsule by mouth daily. otc   Elderberry 575 MG/5ML SYRP Take 1 Dose by mouth daily.    glucose blood (FREESTYLE LITE) test strip USE 1 STRIP TO CHECK GLUCOSE ONCE DAILY AS DIRECTED   JANUVIA 25 MG tablet Take 1 tablet (25 mg total) by mouth daily.   Lancet Devices (ADVOCATE LANCING DEVICE) MISC use once daily as directed to test blood sugar   lansoprazole (PREVACID) 15 MG capsule Take 1 capsule (15 mg total) by mouth 1 day or 1 dose.   LORazepam (ATIVAN) 0.5 MG tablet Take 1 tablet (0.5 mg total) by mouth as needed. Per 30 days   losartan (COZAAR) 25 MG tablet Take 25 mg by mouth daily.   Omega-3 Fatty Acids (FISH OIL) 1000 MG CAPS Take 1 capsule (1,000 mg total) by mouth daily.   simvastatin (ZOCOR) 20 MG tablet Take 1 tablet (20 mg total) by mouth daily at 6 PM.   No facility-administered encounter medications on file as of 05/06/2021.   Have you had any problems recently with your health? Patient states that she has no concerns at the moment.   Have you had any problems with your pharmacy? N/a  What issues or side effects are you having with your medications? No issues with medications. She is no longer taking lisinopril or metformin. Patient states she is taking Januvia and losartan.   What would you like me to pass  along to [facility] Nasuti Potts,CPP for them to help you with?  Patient states she has no concerns at the moment.   What can we do to take care of you better? Patient states she has no concerns at the moment.   Misc. Comment: Patient is scheduled on 05/28/21 @ 1pm  Star Rating Drugs: Januvia 25 mg last fill 02/27/21 90 DS Losartan 25 mg Last fill 05/05/21 30 DS Simvastatin 20 mg Last fill 05/05/21 90 DS   Andee Poles, CMA

## 2021-05-28 ENCOUNTER — Telehealth: Payer: PPO

## 2021-05-29 DIAGNOSIS — N184 Chronic kidney disease, stage 4 (severe): Secondary | ICD-10-CM | POA: Diagnosis not present

## 2021-05-29 DIAGNOSIS — N2581 Secondary hyperparathyroidism of renal origin: Secondary | ICD-10-CM | POA: Diagnosis not present

## 2021-05-29 DIAGNOSIS — E1122 Type 2 diabetes mellitus with diabetic chronic kidney disease: Secondary | ICD-10-CM | POA: Diagnosis not present

## 2021-05-29 DIAGNOSIS — I10 Essential (primary) hypertension: Secondary | ICD-10-CM | POA: Diagnosis not present

## 2021-05-29 DIAGNOSIS — D631 Anemia in chronic kidney disease: Secondary | ICD-10-CM | POA: Diagnosis not present

## 2021-06-17 ENCOUNTER — Telehealth: Payer: PPO

## 2021-06-23 ENCOUNTER — Other Ambulatory Visit: Payer: Self-pay | Admitting: Family Medicine

## 2021-06-23 DIAGNOSIS — K219 Gastro-esophageal reflux disease without esophagitis: Secondary | ICD-10-CM

## 2021-07-03 ENCOUNTER — Other Ambulatory Visit: Payer: Self-pay

## 2021-07-03 ENCOUNTER — Encounter: Payer: Self-pay | Admitting: Family Medicine

## 2021-07-03 ENCOUNTER — Ambulatory Visit (INDEPENDENT_AMBULATORY_CARE_PROVIDER_SITE_OTHER): Payer: PPO | Admitting: Family Medicine

## 2021-07-03 VITALS — BP 120/80 | HR 80 | Ht 65.0 in | Wt 136.0 lb

## 2021-07-03 DIAGNOSIS — K219 Gastro-esophageal reflux disease without esophagitis: Secondary | ICD-10-CM | POA: Diagnosis not present

## 2021-07-03 DIAGNOSIS — E119 Type 2 diabetes mellitus without complications: Secondary | ICD-10-CM | POA: Diagnosis not present

## 2021-07-03 DIAGNOSIS — M79674 Pain in right toe(s): Secondary | ICD-10-CM

## 2021-07-03 DIAGNOSIS — F419 Anxiety disorder, unspecified: Secondary | ICD-10-CM

## 2021-07-03 DIAGNOSIS — E782 Mixed hyperlipidemia: Secondary | ICD-10-CM | POA: Diagnosis not present

## 2021-07-03 MED ORDER — SIMVASTATIN 20 MG PO TABS: 20.0000 mg | ORAL_TABLET | Freq: Every day | ORAL | 1 refills | Status: DC

## 2021-07-03 MED ORDER — LORAZEPAM 0.5 MG PO TABS: 0.5000 mg | ORAL_TABLET | ORAL | 5 refills | Status: DC | PRN

## 2021-07-03 MED ORDER — INDOMETHACIN 50 MG PO CAPS: 50.0000 mg | ORAL_CAPSULE | Freq: Two times a day (BID) | ORAL | 0 refills | Status: DC

## 2021-07-03 NOTE — Progress Notes (Signed)
Date:  07/03/2021   Name:  Jordan Thornton   DOB:  1930/08/17   MRN:  [id number]   Chief Complaint: Diabetes, Hyperlipidemia, and Anxiety  Diabetes She presents for her follow-up diabetic visit. She has type 2 diabetes mellitus. Her disease course has been stable. There are no hypoglycemic associated symptoms. There are no diabetic associated symptoms. There are no hypoglycemic complications. Symptoms are stable. Pertinent negatives for diabetic complications include no autonomic neuropathy, CVA, heart disease, impotence, nephropathy, peripheral neuropathy, PVD or retinopathy. Risk factors for coronary artery disease include hypertension.  Hyperlipidemia Associated symptoms include leg pain.  Anxiety Patient reports no impotence.    Leg Pain  The incident occurred more than 1 week ago (right first MP). There was no injury mechanism. The pain is present in the right toes. The quality of the pain is described as aching. The pain is moderate. The pain has been Constant since onset. Pertinent negatives include no loss of sensation or numbness. She has tried nothing for the symptoms. The treatment provided moderate relief.   Lab Results  Component Value Date   CREATININE 1.9 (A) 09/24/2020   BUN 30 (H) 06/25/2020   NA 143 06/25/2020   K 5.5 (H) 06/25/2020   CL 105 06/25/2020   CO2 23 06/25/2020   Lab Results  Component Value Date   CHOL 180 10/29/2020   HDL 67 10/29/2020   LDLCALC 97 10/29/2020   TRIG 90 10/29/2020   CHOLHDL 2.8 03/02/2018   No results found for: TSH Lab Results  Component Value Date   HGBA1C 6.4 (H) 02/27/2021   Lab Results  Component Value Date   WBC 5.6 10/17/2019   HGB 9.9 (L) 10/17/2019   HCT 30.0 (L) 10/17/2019   MCV 79 10/17/2019   PLT 200 10/17/2019   Lab Results  Component Value Date   ALT 19 09/21/2019   AST 30 09/21/2019   ALKPHOS 35 (L) 09/21/2019   BILITOT 0.4 09/21/2019     Review of Systems  Genitourinary:  Negative for  impotence.  Neurological:  Negative for numbness.   Patient Active Problem List   Diagnosis Date Noted   H/O total hysterectomy with bilateral salpingo-oophorectomy (BSO) 10/29/2020   Anxiety 10/29/2020   Acute hypoxemic respiratory failure due to COVID-19 ([place]) 09/19/2019   Stage 3 chronic kidney disease ([place]) 09/02/2019   Diabetes mellitus with no complication ([place]) 0000000   Familial multiple lipoprotein-type hyperlipidemia 01/03/2015   Creatinine elevation 01/03/2015   Diabetes ([place]) 12/15/2014   Hyperlipidemia 12/15/2014   Hypertension 12/15/2014   Gastro-esophageal reflux disease without esophagitis 12/15/2014    Allergies  Allergen Reactions   Sertraline     Made her more depressed    Past Surgical History:  Procedure Laterality Date   bladder tack     BREAST BIOPSY Left 10-15 yrs ago   benign   CATARACT EXTRACTION Bilateral 05/2015   CHOLECYSTECTOMY OPEN     VAGINAL HYSTERECTOMY      Social History   Tobacco Use   Smoking status: Former    Packs/day: 0.25    Years: 25.00    Pack years: 6.25    Types: Cigarettes    Quit date: 1982    Years since quitting: 40.8   Smokeless tobacco: Never   Tobacco comments:    smoking cessation materials not required  Vaping Use   Vaping Use: Never used  Substance Use Topics   Alcohol use: No    Alcohol/week: 0.0 standard drinks  Drug use: No     Medication list has been reviewed and updated.  Current Meds  Medication Sig   Acetaminophen (TYLENOL ARTHRITIS PAIN PO) Take 2 tablets by mouth daily as needed.    ADVOCATE LANCETS MISC use once daily as directed to test blood sugar   Blood Glucose Monitoring Suppl (FREESTYLE LITE) DEVI use once daily as directed to test blood sugar   Cholecalciferol (VITAMIN D) 2000 units CAPS Take 1 capsule by mouth daily. otc   Elderberry 575 MG/5ML SYRP Take 1 Dose by mouth daily.    glucose blood (FREESTYLE LITE) test strip USE 1 STRIP TO CHECK GLUCOSE ONCE DAILY AS DIRECTED    JANUVIA 25 MG tablet Take 1 tablet (25 mg total) by mouth daily.   Lancet Devices (ADVOCATE LANCING DEVICE) MISC use once daily as directed to test blood sugar   lansoprazole (PREVACID) 15 MG capsule Take 1 capsule by mouth once daily (Patient taking differently: otc)   LORazepam (ATIVAN) 0.5 MG tablet Take 1 tablet (0.5 mg total) by mouth as needed. Per 30 days   losartan (COZAAR) 25 MG tablet Take 25 mg by mouth daily.   Omega-3 Fatty Acids (FISH OIL) 1000 MG CAPS Take 1 capsule (1,000 mg total) by mouth daily.   simvastatin (ZOCOR) 20 MG tablet Take 1 tablet (20 mg total) by mouth daily at 6 PM.    PHQ 2/9 Scores 07/03/2021 02/27/2021 02/17/2021 10/29/2020  PHQ - 2 Score 2 1 0 0  PHQ- 9 Score 2 1 - 0    GAD 7 : Generalized Anxiety Score 07/03/2021 02/27/2021 10/29/2020 06/25/2020  Nervous, Anxious, on Edge 2 0 0 0  Control/stop worrying 2 1 0 0  Worry too much - different things 2 1 0 0  Trouble relaxing 0 0 0 0  Restless 0 0 0 0  Easily annoyed or irritable 0 0 0 0  Afraid - awful might happen 0 0 0 0  Total GAD 7 Score 6 2 0 0  Anxiety Difficulty Somewhat difficult Not difficult at all - -    BP Readings from Last 3 Encounters:  07/03/21 120/80  02/27/21 132/82  02/17/21 130/82    Physical Exam  Wt Readings from Last 3 Encounters:  07/03/21 136 lb (61.7 kg)  02/27/21 142 lb (64.4 kg)  02/17/21 141 lb 12.8 oz (64.3 kg)    BP 120/80   Pulse 80   Ht '5\' 5"'$  (1.651 m)   Wt 136 lb (61.7 kg)   BMI 22.63 kg/m   Assessment and Plan:  1. Type 2 diabetes mellitus without complication, without long-term current use of insulin (HCC) Chronic.  Controlled.  Stable.  Continue Januvia,.  Will check A1c renal function panel microalbuminuria. - HgB A1c - Renal Function Panel - Microalbumin, urine  2. Gastro-esophageal reflux disease without esophagitis Chronic.  Controlled.  Stable.  Patient is unable to afford a prescription Prevacid and she is converted to over-the-counter.   Patient is having Results.  3. Mixed hyperlipidemia On it.  Controlled.  Stable.  Continue simvastatin 20 mg once a day.  We will check lipid panel. - simvastatin (ZOCOR) 20 MG tablet; Take 1 tablet (20 mg total) by mouth daily at 6 PM.  Dispense: 90 tablet; Refill: 1 - Lipid Panel With LDL/HDL Ratio  4. Anxiety Chronic.  Episodic.  Stable.  Continue Ativan 0.5 as needed as needed - LORazepam (ATIVAN) 0.5 MG tablet; Take 1 tablet (0.5 mg total) by mouth as needed. Per 30  days  Dispense: 15 tablet; Refill: 5  5. Toe pain, right New onset.  Pain has redness tenderness first MP joint right great toe.  This is consistent with gout and we will check a uric acid and initiate indomethacin 50 mg 1 twice a day. - Uric acid - indomethacin (INDOCIN) 50 MG capsule; Take 1 capsule (50 mg total) by mouth 2 (two) times daily with a meal.  Dispense: 20 capsule; Refill: 0

## 2021-07-04 LAB — LIPID PANEL WITH LDL/HDL RATIO
Cholesterol, Total: 207 mg/dL — ABNORMAL HIGH (ref 100–199)
HDL: 71 mg/dL (ref >39)
LDL Chol Calc (NIH): 119 mg/dL — ABNORMAL HIGH (ref 0–99)
LDL/HDL Ratio: 1.7 ratio (ref 0.0–3.2)
Triglycerides: 98 mg/dL (ref 0–149)
VLDL Cholesterol Cal: 17 mg/dL (ref 5–40)

## 2021-07-04 LAB — RENAL FUNCTION PANEL
Albumin: 4.4 g/dL (ref 3.6–4.6)
BUN/Creatinine Ratio: 15 (ref 12–28)
BUN: 27 mg/dL (ref 8–27)
CO2: 23 mmol/L (ref 20–29)
Calcium: 10 mg/dL (ref 8.7–10.3)
Chloride: 107 mmol/L — ABNORMAL HIGH (ref 96–106)
Creatinine, Ser: 1.78 mg/dL — ABNORMAL HIGH (ref 0.57–1.00)
Glucose: 115 mg/dL — ABNORMAL HIGH (ref 70–99)
Phosphorus: 4 mg/dL (ref 3.0–4.3)
Potassium: 5.1 mmol/L (ref 3.5–5.2)
Sodium: 143 mmol/L (ref 134–144)
eGFR: 27 mL/min/{1.73_m2} — ABNORMAL LOW (ref >59)

## 2021-07-04 LAB — HEMOGLOBIN A1C
Est. average glucose Bld gHb Est-mCnc: 157 mg/dL
Hgb A1c MFr Bld: 7.1 % — ABNORMAL HIGH (ref 4.8–5.6)

## 2021-07-04 LAB — MICROALBUMIN, URINE: Microalbumin, Urine: 50.9 ug/mL

## 2021-07-04 LAB — URIC ACID: Uric Acid: 6.8 mg/dL (ref 3.1–7.9)

## 2021-07-17 ENCOUNTER — Telehealth: Payer: Self-pay | Admitting: Pharmacist

## 2021-07-17 NOTE — Progress Notes (Signed)
[facility])                                            [place] Team    07/17/2021  [place] McCoy [place] 07-16-1930 [id number]    Placed a call to patient, Jordan Thornton via telephone. HIPAA identifiers were verified. Patient reports that she has about 1 week of Januvia 25 mg tablets on hand.  Informed patient that I would place a call for an expedited refill on her behalf. Informed patient that this will be the last refill for the 2022 year as her enrollment will expire on 09/13/2021. Informed patient that I would identify the appropriate contacts to ensure that she had assistance with her re-enrollment for 2023.    Placed a call to [employer] patient assistance program and was able to secure a refill for the patient. The representative, stated that the refill will be mailed to the patient's home. Mailing address was reconfirmed. Representative stated that the medication would be to the patient within 5 business days.

## 2021-07-23 NOTE — Progress Notes (Signed)
[facility])  [place] Team    07/23/2021  [place] McCoy Rothert Oct 20, 1929 [id number]  Reason for referral: Medication Assistance  Referral source: [facility] RN Current insurance: [employer]  Outreach:  Successful telephone call with [place] Neece.  HIPAA identifiers verified.   Lab Results  Component Value Date   CREATININE 1.78 (H) 07/03/2021   CREATININE 1.9 (A) 09/24/2020   CREATININE 1.90 (H) 06/25/2020    Lab Results  Component Value Date   HGBA1C 7.1 (H) 07/03/2021    Lipid Panel     Component Value Date/Time   CHOL 207 (H) 07/03/2021 1024   TRIG 98 07/03/2021 1024   HDL 71 07/03/2021 1024   CHOLHDL 2.8 03/02/2018 1014   LDLCALC 119 (H) 07/03/2021 1024    BP Readings from Last 3 Encounters:  07/03/21 120/80  02/27/21 132/82  02/17/21 130/82    Allergies  Allergen Reactions   Sertraline     Made her more depressed   Medication Assistance Findings:  Medication assistance needs identified: 2023 Re-enrollment for Januvia   Patient Assistance Programs: Januvia made by [employer] requirement met: Yes Out-of-pocket prescription expenditure met:   Not Applicable Patient has met application requirements to apply for this program.     Additional medication assistance options reviewed with patient as warranted:  No other options identified  Plan: I will route patient assistance letter to [place] technician who will coordinate patient assistance program application process for medications listed above.  [facility] pharmacy technician will assist with obtaining all required documents from both patient and provider(s) and submit application(s) once completed.

## 2021-08-01 ENCOUNTER — Other Ambulatory Visit: Payer: Self-pay | Admitting: Family Medicine

## 2021-08-01 ENCOUNTER — Other Ambulatory Visit: Payer: Self-pay | Admitting: Internal Medicine

## 2021-08-01 DIAGNOSIS — Z1231 Encounter for screening mammogram for malignant neoplasm of breast: Secondary | ICD-10-CM

## 2021-09-03 ENCOUNTER — Other Ambulatory Visit: Payer: Self-pay

## 2021-09-03 ENCOUNTER — Ambulatory Visit
Admission: RE | Admit: 2021-09-03 | Discharge: 2021-09-03 | Disposition: A | Discharge status: Home / Self Care | Payer: PPO | Source: Ambulatory Visit | Attending: Family Medicine | Admitting: Family Medicine

## 2021-09-03 DIAGNOSIS — Z1231 Encounter for screening mammogram for malignant neoplasm of breast: Secondary | ICD-10-CM | POA: Insufficient documentation

## 2021-10-23 DIAGNOSIS — I10 Essential (primary) hypertension: Secondary | ICD-10-CM | POA: Diagnosis not present

## 2021-10-23 DIAGNOSIS — N184 Chronic kidney disease, stage 4 (severe): Secondary | ICD-10-CM | POA: Diagnosis not present

## 2021-10-23 DIAGNOSIS — N2581 Secondary hyperparathyroidism of renal origin: Secondary | ICD-10-CM | POA: Diagnosis not present

## 2021-10-23 DIAGNOSIS — D631 Anemia in chronic kidney disease: Secondary | ICD-10-CM | POA: Diagnosis not present

## 2021-10-23 DIAGNOSIS — E1122 Type 2 diabetes mellitus with diabetic chronic kidney disease: Secondary | ICD-10-CM | POA: Diagnosis not present

## 2021-10-27 DIAGNOSIS — Z9841 Cataract extraction status, right eye: Secondary | ICD-10-CM | POA: Diagnosis not present

## 2021-10-27 DIAGNOSIS — H52223 Regular astigmatism, bilateral: Secondary | ICD-10-CM | POA: Diagnosis not present

## 2021-10-27 DIAGNOSIS — E119 Type 2 diabetes mellitus without complications: Secondary | ICD-10-CM | POA: Diagnosis not present

## 2021-10-27 DIAGNOSIS — Z9842 Cataract extraction status, left eye: Secondary | ICD-10-CM | POA: Diagnosis not present

## 2021-10-27 LAB — HM DIABETES EYE EXAM

## 2021-11-04 ENCOUNTER — Other Ambulatory Visit: Payer: Self-pay

## 2021-11-04 ENCOUNTER — Ambulatory Visit (INDEPENDENT_AMBULATORY_CARE_PROVIDER_SITE_OTHER): Payer: PPO | Admitting: Family Medicine

## 2021-11-04 ENCOUNTER — Encounter: Payer: Self-pay | Admitting: Family Medicine

## 2021-11-04 VITALS — BP 124/70 | HR 68 | Ht 65.0 in | Wt 140.0 lb

## 2021-11-04 DIAGNOSIS — E119 Type 2 diabetes mellitus without complications: Secondary | ICD-10-CM

## 2021-11-04 MED ORDER — JANUVIA 25 MG PO TABS: 25.0000 mg | ORAL_TABLET | Freq: Every day | ORAL | 1 refills | Status: DC

## 2021-11-04 NOTE — Progress Notes (Signed)
Date:  11/04/2021   Name:  Jordan Thornton   DOB:  January 10, 1930   MRN:  [id number]   Chief Complaint: Diabetes  Diabetes She presents for her follow-up diabetic visit. She has type 2 diabetes mellitus. Her disease course has been stable. There are no hypoglycemic associated symptoms. Pertinent negatives for hypoglycemia include no dizziness, headaches or nervousness/anxiousness. There are no diabetic associated symptoms. Pertinent negatives for diabetes include no blurred vision, no chest pain, no fatigue, no foot paresthesias, no foot ulcerations, no polydipsia, no polyphagia, no polyuria, no visual change, no weakness and no weight loss. There are no hypoglycemic complications. Symptoms are stable. There are no diabetic complications. Risk factors for coronary artery disease include dyslipidemia, diabetes mellitus and hypertension. Current diabetic treatment includes oral agent (monotherapy). Her weight is stable. She is following a generally healthy diet. An ACE inhibitor/angiotensin II receptor blocker is being taken.   Lab Results  Component Value Date   NA 143 07/03/2021   K 5.1 07/03/2021   CO2 23 07/03/2021   GLUCOSE 115 (H) 07/03/2021   BUN 27 07/03/2021   CREATININE 1.78 (H) 07/03/2021   CALCIUM 10.0 07/03/2021   EGFR 27 (L) 07/03/2021   GFRNONAA 23 (L) 06/25/2020   Lab Results  Component Value Date   CHOL 207 (H) 07/03/2021   HDL 71 07/03/2021   LDLCALC 119 (H) 07/03/2021   TRIG 98 07/03/2021   CHOLHDL 2.8 03/02/2018   No results found for: TSH Lab Results  Component Value Date   HGBA1C 7.1 (H) 07/03/2021   Lab Results  Component Value Date   WBC 5.6 10/17/2019   HGB 9.9 (L) 10/17/2019   HCT 30.0 (L) 10/17/2019   MCV 79 10/17/2019   PLT 200 10/17/2019   Lab Results  Component Value Date   ALT 19 09/21/2019   AST 30 09/21/2019   ALKPHOS 35 (L) 09/21/2019   BILITOT 0.4 09/21/2019   No results found for: 25OHVITD2, 25OHVITD3, VD25OH   Review of  Systems  Constitutional:  Negative for chills, fatigue, fever and weight loss.  HENT:  Negative for drooling, ear discharge, ear pain and sore throat.   Eyes:  Negative for blurred vision.  Respiratory:  Negative for cough, shortness of breath and wheezing.   Cardiovascular:  Negative for chest pain, palpitations and leg swelling.  Gastrointestinal:  Negative for abdominal pain, blood in stool, constipation, diarrhea and nausea.  Endocrine: Negative for polydipsia, polyphagia and polyuria.  Genitourinary:  Negative for dysuria, frequency, hematuria and urgency.  Musculoskeletal:  Negative for back pain, myalgias and neck pain.  Skin:  Negative for rash.  Allergic/Immunologic: Negative for environmental allergies.  Neurological:  Negative for dizziness, weakness and headaches.  Hematological:  Does not bruise/bleed easily.  Psychiatric/Behavioral:  Negative for suicidal ideas. The patient is not nervous/anxious.    Patient Active Problem List   Diagnosis Date Noted   H/O total hysterectomy with bilateral salpingo-oophorectomy (BSO) 10/29/2020   Anxiety 10/29/2020   Acute hypoxemic respiratory failure due to COVID-19 ([place]) 09/19/2019   Stage 3 chronic kidney disease ([place]) 09/02/2019   Diabetes mellitus with no complication ([place]) 99/83/3825   Familial multiple lipoprotein-type hyperlipidemia 01/03/2015   Creatinine elevation 01/03/2015   Diabetes ([place]) 12/15/2014   Hyperlipidemia 12/15/2014   Hypertension 12/15/2014   Gastro-esophageal reflux disease without esophagitis 12/15/2014    Allergies  Allergen Reactions   Sertraline     Made her more depressed    Past Surgical History:  Procedure Laterality Date  bladder tack     BREAST BIOPSY Left 10-15 yrs ago   benign   CATARACT EXTRACTION Bilateral 05/2015   CHOLECYSTECTOMY OPEN     VAGINAL HYSTERECTOMY      Social History   Tobacco Use   Smoking status: Former    Packs/day: 0.25    Years: 25.00    Pack years: 6.25     Types: Cigarettes    Quit date: 1982    Years since quitting: 41.1   Smokeless tobacco: Never   Tobacco comments:    smoking cessation materials not required  Vaping Use   Vaping Use: Never used  Substance Use Topics   Alcohol use: No    Alcohol/week: 0.0 standard drinks   Drug use: No     Medication list has been reviewed and updated.  Current Meds  Medication Sig   Acetaminophen (TYLENOL ARTHRITIS PAIN PO) Take 2 tablets by mouth daily as needed.    ADVOCATE LANCETS MISC use once daily as directed to test blood sugar   Blood Glucose Monitoring Suppl (FREESTYLE LITE) DEVI use once daily as directed to test blood sugar   Cholecalciferol (VITAMIN D) 2000 units CAPS Take 1 capsule by mouth daily. otc   Elderberry 575 MG/5ML SYRP Take 1 Dose by mouth daily.    glucose blood (FREESTYLE LITE) test strip USE 1 STRIP TO CHECK GLUCOSE ONCE DAILY AS DIRECTED   indomethacin (INDOCIN) 50 MG capsule Take 1 capsule (50 mg total) by mouth 2 (two) times daily with a meal.   JANUVIA 25 MG tablet Take 1 tablet (25 mg total) by mouth daily.   Lancet Devices (ADVOCATE LANCING DEVICE) MISC use once daily as directed to test blood sugar   lansoprazole (PREVACID) 15 MG capsule Take 1 capsule by mouth once daily (Patient taking differently: otc)   LORazepam (ATIVAN) 0.5 MG tablet Take 1 tablet (0.5 mg total) by mouth as needed. Per 30 days   losartan (COZAAR) 25 MG tablet Take 25 mg by mouth daily.   Omega-3 Fatty Acids (FISH OIL) 1000 MG CAPS Take 1 capsule (1,000 mg total) by mouth daily.   simvastatin (ZOCOR) 20 MG tablet Take 1 tablet (20 mg total) by mouth daily at 6 PM.    PHQ 2/9 Scores 11/04/2021 07/03/2021 02/27/2021 02/17/2021  PHQ - 2 Score 0 2 1 0  PHQ- 9 Score 0 2 1 -    GAD 7 : Generalized Anxiety Score 11/04/2021 07/03/2021 02/27/2021 10/29/2020  Nervous, Anxious, on Edge 0 2 0 0  Control/stop worrying 0 2 1 0  Worry too much - different things 0 2 1 0  Trouble relaxing 0 0 0 0   Restless 0 0 0 0  Easily annoyed or irritable 0 0 0 0  Afraid - awful might happen 0 0 0 0  Total GAD 7 Score 0 6 2 0  Anxiety Difficulty Not difficult at all Somewhat difficult Not difficult at all -    BP Readings from Last 3 Encounters:  11/04/21 124/70  07/03/21 120/80  02/27/21 132/82    Physical Exam Vitals and nursing note reviewed.  Constitutional:      Appearance: She is well-developed.  HENT:     Head: Normocephalic.     Right Ear: Tympanic membrane, ear canal and external ear normal. There is no impacted cerumen.     Left Ear: Tympanic membrane, ear canal and external ear normal. There is no impacted cerumen.     Nose: Nose normal. No congestion  or rhinorrhea.     Mouth/Throat:     Mouth: Mucous membranes are moist.  Eyes:     General: Lids are everted, no foreign bodies appreciated. No scleral icterus.       Left eye: No foreign body or hordeolum.     Conjunctiva/sclera: Conjunctivae normal.     Right eye: Right conjunctiva is not injected.     Left eye: Left conjunctiva is not injected.     Pupils: Pupils are equal, round, and reactive to light.  Neck:     Thyroid: No thyromegaly.     Vascular: No JVD.     Trachea: No tracheal deviation.  Cardiovascular:     Rate and Rhythm: Normal rate and regular rhythm.     Heart sounds: Normal heart sounds, S1 normal and S2 normal. No murmur heard. No systolic murmur is present.  No diastolic murmur is present.    No friction rub. No gallop. No S3 or S4 sounds.  Pulmonary:     Effort: Pulmonary effort is normal. No respiratory distress.     Breath sounds: Normal breath sounds. No wheezing, rhonchi or rales.  Abdominal:     Palpations: There is no mass.     Tenderness: There is no abdominal tenderness. There is no guarding or rebound.  Musculoskeletal:        General: Normal range of motion.     Cervical back: Normal range of motion and neck supple.  Lymphadenopathy:     Cervical: No cervical adenopathy.  Skin:     General: Skin is warm.  Neurological:     Mental Status: She is alert.  Psychiatric:        Mood and Affect: Mood is not anxious or depressed.    Wt Readings from Last 3 Encounters:  11/04/21 140 lb (63.5 kg)  07/03/21 136 lb (61.7 kg)  02/27/21 142 lb (64.4 kg)    BP 124/70    Pulse 68    Ht '5\' 5"'  (1.651 m)    Wt 140 lb (63.5 kg)    BMI 23.30 kg/m   Assessment and Plan:  1. Type 2 diabetes mellitus without complication, without long-term current use of insulin (HCC) Chronic.  Controlled.  Stable.  Patient is tolerating Januvia 25 mg once a day.  Will refill for 6 months and recheck in 6 months in the meantime we will check A1c to see where her current status of control is.  In the meantime I have agreed encouraged that she do her best on her diabetic/low carbohydrate diet. - JANUVIA 25 MG tablet; Take 1 tablet (25 mg total) by mouth daily.  Dispense: 90 tablet; Refill: 1 - HgB A1c

## 2021-11-05 LAB — HEMOGLOBIN A1C
Est. average glucose Bld gHb Est-mCnc: 140 mg/dL
Hgb A1c MFr Bld: 6.5 % — ABNORMAL HIGH (ref 4.8–5.6)

## 2021-11-26 ENCOUNTER — Other Ambulatory Visit: Payer: Self-pay | Admitting: Family Medicine

## 2021-11-26 DIAGNOSIS — E119 Type 2 diabetes mellitus without complications: Secondary | ICD-10-CM

## 2022-01-27 ENCOUNTER — Ambulatory Visit (INDEPENDENT_AMBULATORY_CARE_PROVIDER_SITE_OTHER): Payer: PPO | Admitting: Family Medicine

## 2022-01-27 ENCOUNTER — Encounter: Payer: Self-pay | Admitting: Family Medicine

## 2022-01-27 VITALS — BP 130/80 | HR 94 | Ht 65.0 in | Wt 138.0 lb

## 2022-01-27 DIAGNOSIS — F419 Anxiety disorder, unspecified: Secondary | ICD-10-CM | POA: Diagnosis not present

## 2022-01-27 MED ORDER — LORAZEPAM 0.5 MG PO TABS: 0.5000 mg | ORAL_TABLET | ORAL | 5 refills | Status: DC | PRN

## 2022-01-27 MED ORDER — BUSPIRONE HCL 7.5 MG PO TABS: 7.5000 mg | ORAL_TABLET | Freq: Two times a day (BID) | ORAL | 1 refills | Status: DC

## 2022-01-27 NOTE — Progress Notes (Signed)
? ? ?Date:  01/27/2022  ? ?Name:  Jordan Thornton   DOB:  April 05, 1930   MRN:  [id number] ? ? ?Chief Complaint: Medication Refill ? ?Medication Refill ?This is a chronic problem. The current episode started more than 1 year ago. The problem occurs intermittently. The problem has been gradually worsening. Pertinent negatives include no abdominal pain, chest pain, chills, congestion, coughing, fever, headaches, myalgias, nausea, neck pain, rash or sore throat. Nothing aggravates the symptoms. She has tried walking for the symptoms. The treatment provided mild relief.  ?Anxiety ?Presents for follow-up visit. Symptoms include excessive worry, nervous/anxious behavior, panic and restlessness. Patient reports no chest pain, compulsions, confusion, decreased concentration, depressed mood, dizziness, dry mouth, feeling of choking, hyperventilation, impotence, insomnia, irritability, malaise, muscle tension, nausea, obsessions, palpitations, shortness of breath or suicidal ideas. Symptoms occur occasionally. The severity of symptoms is mild.  ? ? ? ?Lab Results  ?Component Value Date  ? NA 143 07/03/2021  ? K 5.1 07/03/2021  ? CO2 23 07/03/2021  ? GLUCOSE 115 (H) 07/03/2021  ? BUN 27 07/03/2021  ? CREATININE 1.78 (H) 07/03/2021  ? CALCIUM 10.0 07/03/2021  ? EGFR 27 (L) 07/03/2021  ? GFRNONAA 23 (L) 06/25/2020  ? ?Lab Results  ?Component Value Date  ? CHOL 207 (H) 07/03/2021  ? HDL 71 07/03/2021  ? LDLCALC 119 (H) 07/03/2021  ? TRIG 98 07/03/2021  ? CHOLHDL 2.8 03/02/2018  ? ?No results found for: TSH ?Lab Results  ?Component Value Date  ? HGBA1C 6.5 (H) 11/04/2021  ? ?Lab Results  ?Component Value Date  ? WBC 5.6 10/17/2019  ? HGB 9.9 (L) 10/17/2019  ? HCT 30.0 (L) 10/17/2019  ? MCV 79 10/17/2019  ? PLT 200 10/17/2019  ? ?Lab Results  ?Component Value Date  ? ALT 19 09/21/2019  ? AST 30 09/21/2019  ? ALKPHOS 35 (L) 09/21/2019  ? BILITOT 0.4 09/21/2019  ? ?No results found for: 25OHVITD2, [place], VD25OH  ? ?Review of  Systems  ?Constitutional:  Negative for chills, fever and irritability.  ?HENT:  Negative for congestion, drooling, ear discharge, ear pain and sore throat.   ?Respiratory:  Negative for cough, shortness of breath and wheezing.   ?Cardiovascular:  Negative for chest pain, palpitations and leg swelling.  ?Gastrointestinal:  Negative for abdominal pain, blood in stool, constipation, diarrhea and nausea.  ?Endocrine: Negative for polydipsia.  ?Genitourinary:  Negative for dysuria, frequency, hematuria, impotence and urgency.  ?Musculoskeletal:  Negative for back pain, myalgias and neck pain.  ?Skin:  Negative for rash.  ?Allergic/Immunologic: Negative for environmental allergies.  ?Neurological:  Negative for dizziness and headaches.  ?Hematological:  Does not bruise/bleed easily.  ?Psychiatric/Behavioral:  Negative for confusion, decreased concentration and suicidal ideas. The patient is nervous/anxious. The patient does not have insomnia.   ? ?Patient Active Problem List  ? Diagnosis Date Noted  ?? H/O total hysterectomy with bilateral salpingo-oophorectomy (BSO) 10/29/2020  ?? Anxiety 10/29/2020  ?? Acute hypoxemic respiratory failure due to COVID-19 ([place]) 09/19/2019  ?? Stage 3 chronic kidney disease ([place]) 09/02/2019  ?? Diabetes mellitus with no complication ([place]) 35/57/3220  ?? Familial multiple lipoprotein-type hyperlipidemia 01/03/2015  ?? Creatinine elevation 01/03/2015  ?? Diabetes ([place]) 12/15/2014  ?? Hyperlipidemia 12/15/2014  ?? Hypertension 12/15/2014  ?? Gastro-esophageal reflux disease without esophagitis 12/15/2014  ? ? ?Allergies  ?Allergen Reactions  ?? Sertraline   ?  Made her more depressed  ? ? ?Past Surgical History:  ?Procedure Laterality Date  ?? bladder tack    ??  BREAST BIOPSY Left 10-15 yrs ago  ? benign  ?? CATARACT EXTRACTION Bilateral 05/2015  ?? CHOLECYSTECTOMY OPEN    ?? VAGINAL HYSTERECTOMY    ? ? ?Social History  ? ?Tobacco Use  ?? Smoking status: Former  ?  Packs/day: 0.25  ?  Years:  25.00  ?  Pack years: 6.25  ?  Types: Cigarettes  ?  Quit date: 30  ?  Years since quitting: 41.3  ?? Smokeless tobacco: Never  ?? Tobacco comments:  ?  smoking cessation materials not required  ?Vaping Use  ?? Vaping Use: Never used  ?Substance Use Topics  ?? Alcohol use: No  ?  Alcohol/week: 0.0 standard drinks  ?? Drug use: No  ? ? ? ?Medication list has been reviewed and updated. ? ?Current Meds  ?Medication Sig  ?? Acetaminophen (TYLENOL ARTHRITIS PAIN PO) Take 2 tablets by mouth daily as needed.   ?? ADVOCATE LANCETS MISC use once daily as directed to test blood sugar  ?? Blood Glucose Monitoring Suppl (FREESTYLE LITE) DEVI use once daily as directed to test blood sugar  ?? Cholecalciferol (VITAMIN D) 2000 units CAPS Take 1 capsule by mouth daily. otc  ?? Elderberry 575 MG/5ML SYRP Take 1 Dose by mouth daily.   ?? FREESTYLE LITE test strip USE 1 STRIP TO CHECK GLUCOSE ONCE DAILY AS DIRECTED  ?? indomethacin (INDOCIN) 50 MG capsule Take 1 capsule (50 mg total) by mouth 2 (two) times daily with a meal.  ?? JANUVIA 25 MG tablet Take 1 tablet (25 mg total) by mouth daily.  ?? Lancet Devices (ADVOCATE LANCING DEVICE) MISC use once daily as directed to test blood sugar  ?? lansoprazole (PREVACID) 15 MG capsule Take 1 capsule by mouth once daily (Patient taking differently: otc)  ?? LORazepam (ATIVAN) 0.5 MG tablet Take 1 tablet (0.5 mg total) by mouth as needed. Per 30 days  ?? losartan (COZAAR) 25 MG tablet Take 25 mg by mouth daily.  ?? Omega-3 Fatty Acids (FISH OIL) 1000 MG CAPS Take 1 capsule (1,000 mg total) by mouth daily.  ?? simvastatin (ZOCOR) 20 MG tablet Take 1 tablet (20 mg total) by mouth daily at 6 PM.  ? ? ? ?  01/27/2022  ?  4:22 PM 11/04/2021  ?  8:52 AM 07/03/2021  ?  9:55 AM 02/27/2021  ? 10:30 AM  ?GAD 7 : Generalized Anxiety Score  ?Nervous, Anxious, on Edge 2 0 2 0  ?Control/stop worrying 1 0 2 1  ?Worry too much - different things 1 0 2 1  ?Trouble relaxing 1 0 0 0  ?Restless 1 0 0 0  ?Easily  annoyed or irritable 1 0 0 0  ?Afraid - awful might happen 1 0 0 0  ?Total GAD 7 Score 8 0 6 2  ?Anxiety Difficulty Not difficult at all Not difficult at all Somewhat difficult Not difficult at all  ? ? ? ?  01/27/2022  ?  4:21 PM  ?Depression screen PHQ 2/9  ?Decreased Interest 0  ?Down, Depressed, Hopeless 0  ?PHQ - 2 Score 0  ?Altered sleeping 0  ?Tired, decreased energy 0  ?Change in appetite 0  ?Feeling bad or failure about yourself  0  ?Trouble concentrating 0  ?Moving slowly or fidgety/restless 0  ?Suicidal thoughts 0  ?PHQ-9 Score 0  ?Difficult doing work/chores Not difficult at all  ? ? ?BP Readings from Last 3 Encounters:  ?01/27/22 130/80  ?11/04/21 124/70  ?07/03/21 120/80  ? ? ?Physical  Exam ?Vitals and nursing note reviewed.  ?Constitutional:   ?   Appearance: She is well-developed.  ?HENT:  ?   Head: Normocephalic.  ?   Right Ear: External ear normal.  ?   Left Ear: External ear normal.  ?Eyes:  ?   General: Lids are everted, no foreign bodies appreciated. No scleral icterus.    ?   Left eye: No foreign body or hordeolum.  ?   Conjunctiva/sclera: Conjunctivae normal.  ?   Right eye: Right conjunctiva is not injected.  ?   Left eye: Left conjunctiva is not injected.  ?   Pupils: Pupils are equal, round, and reactive to light.  ?Neck:  ?   Thyroid: No thyromegaly.  ?   Vascular: No JVD.  ?   Trachea: No tracheal deviation.  ?Cardiovascular:  ?   Rate and Rhythm: Normal rate and regular rhythm.  ?   Heart sounds: Normal heart sounds. No murmur heard. ?  No friction rub. No gallop.  ?Pulmonary:  ?   Effort: Pulmonary effort is normal. No respiratory distress.  ?   Breath sounds: Normal breath sounds. No wheezing, rhonchi or rales.  ?Abdominal:  ?   General: Bowel sounds are normal.  ?   Palpations: Abdomen is soft. There is no mass.  ?   Tenderness: There is no abdominal tenderness. There is no guarding or rebound.  ?Musculoskeletal:     ?   General: No tenderness. Normal range of motion.  ?   Cervical  back: Normal range of motion and neck supple.  ?Lymphadenopathy:  ?   Cervical: No cervical adenopathy.  ?Skin: ?   General: Skin is warm.  ?   Findings: No rash.  ?Neurological:  ?   Mental Status: She is

## 2022-01-31 ENCOUNTER — Observation Stay: Payer: PPO

## 2022-01-31 ENCOUNTER — Other Ambulatory Visit: Payer: Self-pay

## 2022-01-31 ENCOUNTER — Emergency Department: Payer: PPO

## 2022-01-31 ENCOUNTER — Observation Stay
Admission: EM | Admit: 2022-01-31 | Discharge: 2022-02-01 | Disposition: A | Discharge status: Home / Self Care | Payer: PPO | Attending: Internal Medicine | Admitting: Internal Medicine

## 2022-01-31 ENCOUNTER — Ambulatory Visit (INDEPENDENT_AMBULATORY_CARE_PROVIDER_SITE_OTHER)
Admission: EM | Admit: 2022-01-31 | Discharge: 2022-01-31 | Disposition: A | Discharge status: Home / Self Care | Payer: PPO | Source: Home / Self Care

## 2022-01-31 DIAGNOSIS — N390 Urinary tract infection, site not specified: Secondary | ICD-10-CM

## 2022-01-31 DIAGNOSIS — E785 Hyperlipidemia, unspecified: Secondary | ICD-10-CM | POA: Diagnosis not present

## 2022-01-31 DIAGNOSIS — F418 Other specified anxiety disorders: Secondary | ICD-10-CM | POA: Diagnosis present

## 2022-01-31 DIAGNOSIS — R55 Syncope and collapse: Secondary | ICD-10-CM

## 2022-01-31 DIAGNOSIS — J90 Pleural effusion, not elsewhere classified: Secondary | ICD-10-CM | POA: Diagnosis not present

## 2022-01-31 DIAGNOSIS — Z79899 Other long term (current) drug therapy: Secondary | ICD-10-CM | POA: Insufficient documentation

## 2022-01-31 DIAGNOSIS — N3000 Acute cystitis without hematuria: Secondary | ICD-10-CM | POA: Diagnosis not present

## 2022-01-31 DIAGNOSIS — E1122 Type 2 diabetes mellitus with diabetic chronic kidney disease: Secondary | ICD-10-CM | POA: Insufficient documentation

## 2022-01-31 DIAGNOSIS — W19XXXA Unspecified fall, initial encounter: Secondary | ICD-10-CM | POA: Diagnosis not present

## 2022-01-31 DIAGNOSIS — R778 Other specified abnormalities of plasma proteins: Secondary | ICD-10-CM | POA: Insufficient documentation

## 2022-01-31 DIAGNOSIS — I10 Essential (primary) hypertension: Secondary | ICD-10-CM | POA: Diagnosis present

## 2022-01-31 DIAGNOSIS — Z7984 Long term (current) use of oral hypoglycemic drugs: Secondary | ICD-10-CM | POA: Diagnosis not present

## 2022-01-31 DIAGNOSIS — I5032 Chronic diastolic (congestive) heart failure: Secondary | ICD-10-CM | POA: Diagnosis not present

## 2022-01-31 DIAGNOSIS — R7989 Other specified abnormal findings of blood chemistry: Secondary | ICD-10-CM | POA: Diagnosis present

## 2022-01-31 DIAGNOSIS — N184 Chronic kidney disease, stage 4 (severe): Secondary | ICD-10-CM | POA: Diagnosis not present

## 2022-01-31 DIAGNOSIS — E1169 Type 2 diabetes mellitus with other specified complication: Secondary | ICD-10-CM | POA: Diagnosis present

## 2022-01-31 DIAGNOSIS — E1129 Type 2 diabetes mellitus with other diabetic kidney complication: Secondary | ICD-10-CM | POA: Diagnosis present

## 2022-01-31 DIAGNOSIS — R9389 Abnormal findings on diagnostic imaging of other specified body structures: Secondary | ICD-10-CM | POA: Diagnosis not present

## 2022-01-31 DIAGNOSIS — Z87891 Personal history of nicotine dependence: Secondary | ICD-10-CM | POA: Diagnosis not present

## 2022-01-31 DIAGNOSIS — I130 Hypertensive heart and chronic kidney disease with heart failure and stage 1 through stage 4 chronic kidney disease, or unspecified chronic kidney disease: Secondary | ICD-10-CM | POA: Diagnosis not present

## 2022-01-31 LAB — CBC WITH DIFFERENTIAL/PLATELET
Abs Immature Granulocytes: 0.02 10*3/uL (ref 0.00–0.07)
Basophils Absolute: 0 10*3/uL (ref 0.0–0.1)
Basophils Relative: 0 %
Eosinophils Absolute: 0 10*3/uL (ref 0.0–0.5)
Eosinophils Relative: 1 %
HCT: 36.6 % (ref 36.0–46.0)
Hemoglobin: 11.8 g/dL — ABNORMAL LOW (ref 12.0–15.0)
Immature Granulocytes: 0 %
Lymphocytes Relative: 20 %
Lymphs Abs: 1.3 10*3/uL (ref 0.7–4.0)
MCH: 25.6 pg — ABNORMAL LOW (ref 26.0–34.0)
MCHC: 32.2 g/dL (ref 30.0–36.0)
MCV: 79.4 fL — ABNORMAL LOW (ref 80.0–100.0)
Monocytes Absolute: 0.4 10*3/uL (ref 0.1–1.0)
Monocytes Relative: 6 %
Neutro Abs: 4.8 10*3/uL (ref 1.7–7.7)
Neutrophils Relative %: 73 %
Platelets: 178 10*3/uL (ref 150–400)
RBC: 4.61 MIL/uL (ref 3.87–5.11)
RDW: 14.7 % (ref 11.5–15.5)
WBC: 6.5 10*3/uL (ref 4.0–10.5)
nRBC: 0 % (ref 0.0–0.2)

## 2022-01-31 LAB — GLUCOSE, CAPILLARY
Glucose-Capillary: 206 mg/dL — ABNORMAL HIGH (ref 70–99)
Glucose-Capillary: 130 mg/dL — ABNORMAL HIGH (ref 70–99)
Glucose-Capillary: 89 mg/dL (ref 70–99)

## 2022-01-31 LAB — URINALYSIS, ROUTINE W REFLEX MICROSCOPIC
Bilirubin Urine: NEGATIVE
Glucose, UA: NEGATIVE mg/dL
Hgb urine dipstick: NEGATIVE
Ketones, ur: NEGATIVE mg/dL
Nitrite: NEGATIVE
Protein, ur: NEGATIVE mg/dL
Specific Gravity, Urine: 1.008 (ref 1.005–1.030)
pH: 6 (ref 5.0–8.0)

## 2022-01-31 LAB — COMPREHENSIVE METABOLIC PANEL
ALT: 15 U/L (ref 0–44)
AST: 19 U/L (ref 15–41)
Albumin: 4 g/dL (ref 3.5–5.0)
Alkaline Phosphatase: 53 U/L (ref 38–126)
Anion gap: 8 (ref 5–15)
BUN: 22 mg/dL (ref 8–23)
CO2: 25 mmol/L (ref 22–32)
Calcium: 9.2 mg/dL (ref 8.9–10.3)
Chloride: 105 mmol/L (ref 98–111)
Creatinine, Ser: 2.07 mg/dL — ABNORMAL HIGH (ref 0.44–1.00)
GFR, Estimated: 22 mL/min — ABNORMAL LOW (ref >60)
Glucose, Bld: 174 mg/dL — ABNORMAL HIGH (ref 70–99)
Potassium: 4.2 mmol/L (ref 3.5–5.1)
Sodium: 138 mmol/L (ref 135–145)
Total Bilirubin: 1.2 mg/dL (ref 0.3–1.2)
Total Protein: 7 g/dL (ref 6.5–8.1)

## 2022-01-31 LAB — HEMOGLOBIN A1C
Hgb A1c MFr Bld: 6.5 % — ABNORMAL HIGH (ref 4.8–5.6)
Mean Plasma Glucose: 139.85 mg/dL

## 2022-01-31 LAB — TROPONIN I (HIGH SENSITIVITY)
Troponin I (High Sensitivity): 20 ng/L — ABNORMAL HIGH (ref <18)
Troponin I (High Sensitivity): 22 ng/L — ABNORMAL HIGH (ref <18)
Troponin I (High Sensitivity): 23 ng/L — ABNORMAL HIGH (ref <18)

## 2022-01-31 LAB — CBG MONITORING, ED: Glucose-Capillary: 168 mg/dL — ABNORMAL HIGH (ref 70–99)

## 2022-01-31 LAB — BRAIN NATRIURETIC PEPTIDE: B Natriuretic Peptide: 1814 pg/mL — ABNORMAL HIGH (ref 0.0–100.0)

## 2022-01-31 MED ORDER — SODIUM CHLORIDE 0.9 % IV SOLN: INTRAVENOUS | Status: DC

## 2022-01-31 MED ORDER — INSULIN ASPART 100 UNIT/ML IJ SOLN: 0.0000 [IU] | Freq: Every day | INTRAMUSCULAR | Status: DC

## 2022-01-31 MED ORDER — SIMVASTATIN 20 MG PO TABS
20.0000 mg | ORAL_TABLET | Freq: Every day | ORAL | Status: DC
  Administered 2022-01-31: 20 mg via ORAL
  Filled 2022-01-31: qty 1

## 2022-01-31 MED ORDER — HYDRALAZINE HCL 20 MG/ML IJ SOLN: 5.0000 mg | INTRAMUSCULAR | Status: DC | PRN

## 2022-01-31 MED ORDER — ELDERBERRY 575 MG/5ML PO SYRP: 1.0000 | ORAL_SOLUTION | Freq: Every day | ORAL | Status: DC

## 2022-01-31 MED ORDER — INSULIN ASPART 100 UNIT/ML IJ SOLN
0.0000 [IU] | Freq: Three times a day (TID) | INTRAMUSCULAR | Status: DC
  Administered 2022-01-31: 2 [IU] via SUBCUTANEOUS
  Administered 2022-02-01: 1 [IU] via SUBCUTANEOUS
  Filled 2022-01-31 (×2): qty 1

## 2022-01-31 MED ORDER — ASPIRIN 81 MG PO TBEC
81.0000 mg | DELAYED_RELEASE_TABLET | Freq: Every day | ORAL | Status: DC
  Administered 2022-02-01: 81 mg via ORAL
  Filled 2022-01-31: qty 1

## 2022-01-31 MED ORDER — CEFTRIAXONE SODIUM 1 G IJ SOLR
1.0000 g | INTRAMUSCULAR | Status: AC
  Administered 2022-01-31: 1 g via INTRAVENOUS
  Filled 2022-01-31: qty 10

## 2022-01-31 MED ORDER — ACETAMINOPHEN 325 MG PO TABS: 650.0000 mg | ORAL_TABLET | Freq: Four times a day (QID) | ORAL | Status: DC | PRN

## 2022-01-31 MED ORDER — ASPIRIN 81 MG PO CHEW
324.0000 mg | CHEWABLE_TABLET | Freq: Once | ORAL | Status: AC
  Administered 2022-01-31: 324 mg via ORAL
  Filled 2022-01-31: qty 4

## 2022-01-31 MED ORDER — SODIUM CHLORIDE 0.9 % IV SOLN
1.0000 g | INTRAVENOUS | Status: DC
  Filled 2022-01-31: qty 10

## 2022-01-31 MED ORDER — ONDANSETRON HCL 4 MG/2ML IJ SOLN
4.0000 mg | Freq: Three times a day (TID) | INTRAMUSCULAR | Status: DC | PRN
Start: 2022-01-31 — End: 2022-02-01

## 2022-01-31 MED ORDER — ENOXAPARIN SODIUM 30 MG/0.3ML IJ SOSY: 30.0000 mg | PREFILLED_SYRINGE | INTRAMUSCULAR | Status: DC

## 2022-01-31 MED ORDER — OMEGA-3-ACID ETHYL ESTERS 1 G PO CAPS
1.0000 g | ORAL_CAPSULE | Freq: Every day | ORAL | Status: DC
Start: 2022-02-01 — End: 2022-02-01
  Administered 2022-02-01: 1 g via ORAL
  Filled 2022-01-31: qty 1

## 2022-01-31 MED ORDER — VITAMIN D 25 MCG (1000 UNIT) PO TABS
2000.0000 [IU] | ORAL_TABLET | Freq: Every day | ORAL | Status: DC
  Administered 2022-01-31 – 2022-02-01 (×2): 2000 [IU] via ORAL
  Filled 2022-01-31 (×2): qty 2

## 2022-01-31 MED ORDER — FUROSEMIDE 10 MG/ML IJ SOLN
60.0000 mg | Freq: Every day | INTRAMUSCULAR | Status: DC
  Administered 2022-01-31: 60 mg via INTRAVENOUS
  Filled 2022-01-31: qty 8

## 2022-01-31 MED ORDER — LORAZEPAM 0.5 MG PO TABS: 0.5000 mg | ORAL_TABLET | Freq: Two times a day (BID) | ORAL | Status: DC | PRN

## 2022-01-31 MED ORDER — DIAZEPAM 2 MG PO TABS
1.0000 mg | ORAL_TABLET | Freq: Once | ORAL | Status: AC
  Administered 2022-01-31: 1 mg via ORAL
  Filled 2022-01-31: qty 1

## 2022-01-31 MED ORDER — PANTOPRAZOLE SODIUM 20 MG PO TBEC
20.0000 mg | DELAYED_RELEASE_TABLET | Freq: Every day | ORAL | Status: DC
  Administered 2022-02-01: 20 mg via ORAL
  Filled 2022-01-31: qty 1

## 2022-01-31 NOTE — Assessment & Plan Note (Addendum)
Troponin level 20, 22.  Possibly due to demand ischemia.  No chest pain. A1c of 6.5, lipid panel unremarkable.  Echocardiogram pending -Continue aspirin, Zocor -Follow-up 2D echo

## 2022-01-31 NOTE — ED Triage Notes (Signed)
Patient is here for "I was finishing up shower this am, standing in front of sink, then everything started to blur & then passed out". The last few days "decreased appetite" so eating a lot less than normal. After fainting found herself lying on floor, had fallen toward door, not toward commode, so no (known) head injury. No lacerations/abrasions.

## 2022-01-31 NOTE — ED Notes (Signed)
EMS Contacted for Transport, [place]  Patient is being discharged from the Urgent Care and sent to the Emergency Department via Ambulance . Per Proivder, patient is in need of higher level of care due to Comlpexity, Syncope, Head Injury. Patient is aware and verbalizes understanding of plan of care.  Vitals:   01/31/22 1043  BP: (!) 153/84  Pulse: 92  Resp: 20  Temp: 97.6 F (36.4 C)  SpO2: 99%

## 2022-01-31 NOTE — ED Provider Notes (Signed)
[facility] Provider Note    Event Date/Time   First MD Initiated Contact with Patient 01/31/22 1211     (approximate)   History   Loss of Consciousness   HPI  Jordan Thornton is a 88 y.o. female history of diabetes hypertension hyperlipidemia  She got up this morning had apple juice and water, took a shower and when she got out she was standing at the counter and started feel lightheaded.  She reports she did not really so much fall but she sort of later self down and sort of collapsed or passed out of the on the floor.  She got herself up afterwards and actually went back to her bedroom, notified her husband and he decided she would best come to the urgent care for evaluation  She was seen in urgent care and per EMS she had EKG changes compared with previous and was referred to the ED for further work-up.  She denies any palpitations chest pain or preceding illness other than for about 3 or 4 days she just has not had quite her normal appetite.  No shortness of breath.  No leg swelling.  No chest pain no trouble breathing at all.  She feels perfectly fine now.  No headache does not feel like she got injured at all   Reviewed patient's medical record including visit from urgent care dated today  Physical Exam   Triage Vital Signs: ED Triage Vitals  Enc Vitals Group     BP 01/31/22 1218 (!) 149/87     Pulse Rate 01/31/22 1210 79     Resp 01/31/22 1210 (!) 21     Temp 01/31/22 1210 97.8 F (36.6 C)     Temp src --      SpO2 01/31/22 1210 100 %     Weight 01/31/22 1211 138 lb 0.1 oz (62.6 kg)     Height 01/31/22 1211 5\' 5"  (1.651 m)     Head Circumference --      Peak Flow --      Pain Score 01/31/22 1211 0     Pain Loc --      Pain Edu? --      Excl. in [place]? --     Most recent vital signs: Vitals:   01/31/22 1300 01/31/22 1330  BP: 117/70 136/90  Pulse: 93 76  Resp: 16 (!) 23  Temp:    SpO2: 100% 100%     General: Awake, no  distress.  Very pleasant.  Normocephalic atraumatic CV:  Good peripheral perfusion.  Normal heart tones no murmur.  Regular Resp:  Normal effort.  Clear lungs bilateral normal work of breathing and speaks in full clear sentences Abd:  No distention.  Soft nontender Other:  Normal cranial nerve exam.  Moves all extremities to command.  No pronator drift in any extremity.  5 out of 5 strength in all extremities.  Normal level of alertness and conversation   ED Results / Procedures / Treatments   Labs (all labs ordered are listed, but only abnormal results are displayed) Labs Reviewed  COMPREHENSIVE METABOLIC PANEL - Abnormal; Notable for the following components:      Result Value   Glucose, Bld 174 (*)    Creatinine, Ser 2.07 (*)    GFR, Estimated 22 (*)    All other components within normal limits  CBC WITH DIFFERENTIAL/PLATELET - Abnormal; Notable for the following components:   Hemoglobin 11.8 (*)    MCV 79.4 (*)  MCH 25.6 (*)    All other components within normal limits  URINALYSIS, ROUTINE W REFLEX MICROSCOPIC - Abnormal; Notable for the following components:   Color, Urine YELLOW (*)    APPearance HAZY (*)    Leukocytes,Ua SMALL (*)    Bacteria, UA MANY (*)    All other components within normal limits  CBG MONITORING, ED - Abnormal; Notable for the following components:   Glucose-Capillary 168 (*)    All other components within normal limits  TROPONIN I (HIGH SENSITIVITY) - Abnormal; Notable for the following components:   Troponin I (High Sensitivity) 20 (*)    All other components within normal limits  CULTURE, BLOOD (ROUTINE X 2)  CULTURE, BLOOD (ROUTINE X 2)  URINE CULTURE  HEMOGLOBIN A1C  BRAIN NATRIURETIC PEPTIDE  TROPONIN I (HIGH SENSITIVITY)     EKG  Reviewed interpreted me at 1210 Heart rate 75 QRS 110 QTc 450 Normal sinus rhythm, ST segment show inversions in lateral precordial leads also slightly noted in 1 and aVL.  This is somewhat similar to the  EKG which I have reviewed the EMS obtained as well.  Review of a previous EKG from September 19, 2019 shows normal ST segments  EKG today is notable for new T wave abnormality     RADIOLOGY   X-ray of the chest interpreted by me as increased interstitial markings.  Radiologist read Mild bilateral lower lung predominant interstitial and airspace opacities may represent pneumonia. Trace bilateral pleural effusions.  PROCEDURES:  Critical Care performed: No  Procedures   MEDICATIONS ORDERED IN ED: Medications  cefTRIAXone (ROCEPHIN) 1 g in sodium chloride 0.9 % 100 mL IVPB (has no administration in time range)  insulin aspart (novoLOG) injection 0-9 Units (has no administration in time range)  insulin aspart (novoLOG) injection 0-5 Units (has no administration in time range)  cefTRIAXone (ROCEPHIN) 1 g in sodium chloride 0.9 % 100 mL IVPB (has no administration in time range)  aspirin EC tablet 81 mg (has no administration in time range)  aspirin chewable tablet 324 mg (has no administration in time range)  ondansetron (ZOFRAN) injection 4 mg (has no administration in time range)  acetaminophen (TYLENOL) tablet 650 mg (has no administration in time range)  hydrALAZINE (APRESOLINE) injection 5 mg (has no administration in time range)     IMPRESSION / MDM / ASSESSMENT AND PLAN / ED COURSE  I reviewed the triage vital signs and the nursing notes.                              Differential diagnosis includes, but is not limited to, syncope possibly secondary to etiology such as arrhythmia, cardiac, NSTEMI, ACS given EKG abnormalities though the patient lacks chest pain.  She is however female of advanced age and diabetic and we will certainly send troponin.  Other further work-up includes causes such as metabolic, anemia, infection etc.  She has no obvious infectious symptoms but does report fatigue over last 3 to 4 days.  Will check urinalysis as well  Normocephalic atraumatic.   Not on anticoagulants fully alert and oriented no headache no clinical signs or symptoms of be suggestive of high risk for intracranial hemorrhage.  I do not see central neurologic symptoms    The patient is on the cardiac monitor to evaluate for evidence of arrhythmia and/or significant heart rate changes.  SF syncope positive, will admit for further work-up and observation.  ----------------------------------------- 1:57 PM  on 01/31/2022 ----------------------------------------- Patient with very minimally elevated troponin.  CBC with normal white count hemoglobin 11.8.  Creatinine slightly increased from baseline.  Chest x-ray with some interstitial increased markings in hint of possible small pleural effusions.  Will check BNP, patient does not appear to have any overt infectious symptoms to suggest pneumonia however will add procalcitonin I do suspect she may have a urinary tract infection for which she gave a clean sample and many bacteria are noted, will start Rocephin.  Patient and her husband both understanding agreeable with plan to start antibiotic, further observation and work-up for etiology as to cause of syncope.  Further cardiac work-up is anticipated at this time.  Consulted with her hospitalist due to concern for increased risk of morbidity mortality associated with syncope and EKG changes.  Due to the need for further work-up, we will admit to the hospitalist.  Case discussed with Dr. Blaine Hamper     FINAL CLINICAL IMPRESSION(S) / ED DIAGNOSES   Final diagnoses:  Syncope and collapse  Urinary tract infection, acute  Abnormal chest x-ray     Rx / DC Orders   ED Discharge Orders     None        Note:  This document was prepared using Dragon voice recognition software and may include unintentional dictation errors.   Delman Kitten, MD 01/31/22 1424

## 2022-01-31 NOTE — Assessment & Plan Note (Addendum)
Sedated was not any baseline.  Baseline creatinine 1.7-1.9 recently.  Slight worsening of creatinine to 2.11 and BUN of 26 today. -Discontinue IV Lasix -Keep holding Cozaar and indomethacin

## 2022-01-31 NOTE — Assessment & Plan Note (Addendum)
No prior history of urine infection on chart review -rocephin-can be switched to Keflex upon discharge -f/u Urine culture

## 2022-01-31 NOTE — Assessment & Plan Note (Signed)
As needed Ativan, propranolol. Also on Cymbalta and Wellbutrin. Close outpatient follow up. 

## 2022-01-31 NOTE — Assessment & Plan Note (Addendum)
-   IV hydralazine as needed -Hold Cozaar due to worsening renal function -Also holding IV Lasix

## 2022-01-31 NOTE — ED Notes (Signed)
ED Provider at bedside. 

## 2022-01-31 NOTE — Plan of Care (Signed)

## 2022-01-31 NOTE — H&P (Signed)
History and Physical    Jordan Thornton HDQ:[id number] DOB: 04-29-1930 DOA: 01/31/2022  Referring MD/NP/PA:   PCP: Juline Patch, MD   Patient coming from:  The patient is coming from home.  At baseline, pt is independent for most of ADL.        Chief Complaint: syncope  HPI: Jordan Thornton is a 88 y.o. female with medical history significant of HTN, HLD, DM, CKD-IV, dCHF, gout, depression with anxiety, GERD, who presents with syncope.   Pt states that she was finishing a shower this am, standing in front of sink, then everything started to blur & then passed out on the floor. No acute injury.  No headache or neck pain. She states that she passed out for about 1 minutes.  Patient denies dizziness.  No unilateral numbness or tinglings.  No facial droop or slurred speech.  Patient states that she had vision changes, described as "seeing yellow colored Prisma blade before her eyes".  Patient denies chest pain, cough, shortness breath.  Patient has poor appetite and decreased oral intake recently, has nausea, no vomiting, diarrhea or abdominal pain.  Denies symptoms of UTI.  She was seen in urgent care and per EMS she had EKG changes compared with previous and was referred to the ED for further work-up.   Data Reviewed and ED Course: pt was found to have WBC 6.5, troponin level 20, 22, BNP 1814, positive urinalysis (hazy appearance, small amount of leukocyte, many bacteria, WBC 11-20), slightly worsening renal function, temperature normal, blood pressure 136/90, heart rate 76, RR 23, oxygen saturation 100% on room air.  Chest x-ray showed bilateral lower lobe interstitial prominence.  Patient is placed on telemetry bed for observation.   EKG: I have personally reviewed.  Sinus rhythm, QTc 450, LAD, T wave inversion in V 4-V6, lead II/3 in the lateral leads.   Review of Systems:   General: no fevers, chills, no body weight gain, has poor appetite, has fatigue HEENT: has vision  change, no hearing changes or sore throat Respiratory: no dyspnea, coughing, wheezing CV: no chest pain, no palpitations GI: has nausea, no vomiting, abdominal pain, diarrhea, constipation GU: no dysuria, burning on urination, increased urinary frequency, hematuria  Ext: has trace leg edema Neuro: no unilateral weakness, numbness, or tingling, hearing loss Skin: no rash, no skin tear. MSK: No muscle spasm, no deformity, no limitation of range of movement in spin Heme: No easy bruising.  Travel history: No recent long distant travel.   Allergy:  Allergies  Allergen Reactions   Sertraline     Made her more depressed    Past Medical History:  Diagnosis Date   Diabetes mellitus without complication ([place])    GERD (gastroesophageal reflux disease)    Hyperlipidemia    Hypertension     Past Surgical History:  Procedure Laterality Date   bladder tack     BREAST BIOPSY Left 10-15 yrs ago   benign   CATARACT EXTRACTION Bilateral 05/2015   CHOLECYSTECTOMY OPEN     VAGINAL HYSTERECTOMY      Social History:  reports that she quit smoking about 41 years ago. Her smoking use included cigarettes. She has a 6.25 pack-year smoking history. She has never used smokeless tobacco. She reports that she does not drink alcohol and does not use drugs.  Family History:  Family History  Problem Relation Age of Onset   Heart disease Maternal Aunt    Heart disease Maternal Grandmother    Stroke Father  Breast cancer Neg Hx      Prior to Admission medications   Medication Sig Start Date End Date Taking? Authorizing Provider  Acetaminophen (TYLENOL ARTHRITIS PAIN PO) Take 2 tablets by mouth daily as needed.     [provider]  ADVOCATE LANCETS MISC use once daily as directed to test blood sugar 04/24/15   Juline Patch, MD  Blood Glucose Monitoring Suppl (FREESTYLE LITE) DEVI use once daily as directed to test blood sugar 04/24/15   Juline Patch, MD  busPIRone (BUSPAR) 7.5 MG  tablet Take 1 tablet (7.5 mg total) by mouth 2 (two) times daily. 01/27/22   Juline Patch, MD  Cholecalciferol (VITAMIN D) 2000 units CAPS Take 1 capsule by mouth daily. otc    [provider]  Elderberry 575 MG/5ML SYRP Take 1 Dose by mouth daily.     [provider]  FREESTYLE LITE test strip USE 1 STRIP TO CHECK GLUCOSE ONCE DAILY AS DIRECTED 11/26/21   Juline Patch, MD  indomethacin (INDOCIN) 50 MG capsule Take 1 capsule (50 mg total) by mouth 2 (two) times daily with a meal. 07/03/21   Juline Patch, MD  JANUVIA 25 MG tablet Take 1 tablet (25 mg total) by mouth daily. 11/04/21   Juline Patch, MD  Lancet Devices (ADVOCATE LANCING DEVICE) MISC use once daily as directed to test blood sugar 04/24/15   Juline Patch, MD  lansoprazole (PREVACID) 15 MG capsule Take 1 capsule by mouth once daily Patient taking differently: otc 06/23/21   Juline Patch, MD  LORazepam (ATIVAN) 0.5 MG tablet Take 1 tablet (0.5 mg total) by mouth as needed. Per 30 days 01/27/22   Juline Patch, MD  losartan (COZAAR) 25 MG tablet Take 25 mg by mouth daily. 02/06/21   [provider]  Omega-3 Fatty Acids (FISH OIL) 1000 MG CAPS Take 1 capsule (1,000 mg total) by mouth daily. 11/17/19   Juline Patch, MD  simvastatin (ZOCOR) 20 MG tablet Take 1 tablet (20 mg total) by mouth daily at 6 PM. 07/03/21   Juline Patch, MD    Physical Exam: Vitals:   01/31/22 1330 01/31/22 1430 01/31/22 1500 01/31/22 1726  BP: 136/90 115/87 129/70 (!) 141/89  Pulse: 76 67 71 81  Resp: (!) 23 (!) 24 (!) 22 18  Temp:    98.2 F (36.8 C)  SpO2: 100% 100% 100% 100%  Weight:      Height:       General: Not in acute distress HEENT:       Eyes: PERRL, EOMI, no scleral icterus.       ENT: No discharge from the ears and nose, no pharynx injection, no tonsillar enlargement.        Neck: No JVD, no bruit, no mass felt. Heme: No neck lymph node enlargement. Cardiac: S1/S2, RRR, No murmurs, No gallops or  rubs. Respiratory: No rales, wheezing, rhonchi or rubs. GI: Soft, nondistended, nontender, no rebound pain, no organomegaly, BS present. GU: No hematuria Ext: has trace leg edema bilaterally. 1+DP/PT pulse bilaterally. Musculoskeletal: No joint deformities, No joint redness or warmth, no limitation of ROM in spin. Skin: No rashes.  Neuro: Alert, oriented X3, cranial nerves II-XII grossly intact, moves all extremities normally. Psych: Patient is not psychotic, no suicidal or hemocidal ideation.  Labs on Admission: I have personally reviewed following labs and imaging studies  CBC: Recent Labs  Lab 01/31/22 1214  WBC 6.5  NEUTROABS 4.8  HGB 11.8*  HCT 36.6  MCV 79.4*  PLT 546   Basic Metabolic Panel: Recent Labs  Lab 01/31/22 1214  NA 138  K 4.2  CL 105  CO2 25  GLUCOSE 174*  BUN 22  CREATININE 2.07*  CALCIUM 9.2   GFR: Estimated Creatinine Clearance: 16.3 mL/min (A) (by C-G formula based on SCr of 2.07 mg/dL (H)). Liver Function Tests: Recent Labs  Lab 01/31/22 1214  AST 19  ALT 15  ALKPHOS 53  BILITOT 1.2  PROT 7.0  ALBUMIN 4.0   No results for input(s): LIPASE, AMYLASE in the last 168 hours. No results for input(s): AMMONIA in the last 168 hours. Coagulation Profile: No results for input(s): INR, PROTIME in the last 168 hours. Cardiac Enzymes: No results for input(s): CKTOTAL, CKMB, CKMBINDEX, TROPONINI in the last 168 hours. BNP (last 3 results) No results for input(s): PROBNP in the last 8760 hours. HbA1C: No results for input(s): HGBA1C in the last 72 hours. CBG: Recent Labs  Lab 01/31/22 1049 01/31/22 1210 01/31/22 1532  GLUCAP 206* 168* 130*   Lipid Profile: No results for input(s): CHOL, HDL, LDLCALC, TRIG, CHOLHDL, LDLDIRECT in the last 72 hours. Thyroid Function Tests: No results for input(s): TSH, T4TOTAL, FREET4, T3FREE, THYROIDAB in the last 72 hours. Anemia Panel: No results for input(s): VITAMINB12, FOLATE, FERRITIN, TIBC, IRON,  RETICCTPCT in the last 72 hours. Urine analysis:    Component Value Date/Time   COLORURINE YELLOW (A) 01/31/2022 1209   APPEARANCEUR HAZY (A) 01/31/2022 1209   LABSPEC 1.008 01/31/2022 1209   PHURINE 6.0 01/31/2022 1209   GLUCOSEU NEGATIVE 01/31/2022 1209   HGBUR NEGATIVE 01/31/2022 1209   [place] 01/31/2022 1209   BILIRUBINUR negative 06/17/2020 1159   [place] 01/31/2022 1209   PROTEINUR NEGATIVE 01/31/2022 1209   UROBILINOGEN 0.2 06/17/2020 1159   NITRITE NEGATIVE 01/31/2022 1209   LEUKOCYTESUR SMALL (A) 01/31/2022 1209   Sepsis Labs: @LABRCNTIP (procalcitonin:4,lacticidven:4) )No results found for this or any previous visit (from the past 240 hour(s)).   Radiological Exams on Admission: DG Chest 2 View  Result Date: 01/31/2022 CLINICAL DATA:  Syncope EXAM: CHEST - 2 VIEW COMPARISON:  Chest radiograph dated 10/09/2019. FINDINGS: The heart size and mediastinal contours are within normal limits. Mild bilateral lower lung predominant interstitial and airspace opacities are noted. Trace bilateral pleural effusions are noted. There is no pneumothorax. Degenerative changes are seen in the spine. IMPRESSION: Mild bilateral lower lung predominant interstitial and airspace opacities may represent pneumonia. Trace bilateral pleural effusions. Electronically Signed   By: Zerita Boers M.D.   On: 01/31/2022 13:21      Assessment/Plan Principal Problem:   Syncope Active Problems:   UTI (urinary tract infection)   Elevated troponin   Hyperlipidemia   Hypertension   Type II diabetes mellitus with renal manifestations (HCC)   Depression with anxiety   CKD (chronic kidney disease), stage IV (HCC)   Chronic diastolic CHF (congestive heart failure) (HCC)   Principal Problem:   Syncope Active Problems:   UTI (urinary tract infection)   Elevated troponin   Hyperlipidemia   Hypertension   Type II diabetes mellitus with renal manifestations (HCC)   Depression with  anxiety   CKD (chronic kidney disease), stage IV (HCC)   Chronic diastolic CHF (congestive heart failure) (HCC)   Assessment and Plan: * Syncope Etiology is not clear.  Possibly due to vasovagal syncope versus orthostatic status.  However patient has abnormal EKG and elevated troponin, will get 2D echo.  Patient also has vision change, will get MRI of the brain to rule out stroke  -Placed on telemetry bed for observation -MRI for brain -2D echo -Check orthostatic vital sign -Frequent neurochecks  UTI (urinary tract infection) -rocephin -f/u Urine culture  Elevated troponin Troponin level 20, 22.  Possibly due to demand ischemia.  No chest pain -Trend troponin -Check A1c, FLP -Aspirin, Zocor -Follow-up 2D echo  Chronic diastolic CHF (congestive heart failure) ([place]) 2D echo 09/19/2019 showed EF 60 to 65% with grade 2 diastolic dysfunction.  Patient does not have shortness of breath, but the chest x-ray showed bilateral lower lobe interstitial prominence.  BNP is elevated 1841.  Patient does not seem to have acute CHF exacerbation, but obviously at risk of developing CHF exacerbation. -Start Lasix 60 mg daily -Follow-up 2D echo  CKD (chronic kidney disease), stage IV ([place]) Sedated was not any baseline.  Baseline creatinine 1.6-1.8 recently.  Her creatinine is at 2.07, BUN 22 -Hold Cozaar and indomethacin  Depression with anxiety - As needed Ativan  Type II diabetes mellitus with renal manifestations (HCC) Recent A1c 6.5, well controlled.  Patient is taking Januvia -Sliding scale insulin  Hypertension - IV hydralazine as needed -Hold Cozaar due to worsening renal function -Patient is on IV Lasix   Hyperlipidemia - Zocor             DVT ppx:  SQ Lovenox  Code Status: Full code  Family Communication: Patient states that her son and husband were with her earlier, they know what is going on for her. She said I do not need to call her family.  Disposition  Plan:  Anticipate discharge back to previous environment  Consults called:  none  Admission status and Level of care: Telemetry Medical:   for obs    Severity of Illness:  The appropriate patient status for this patient is OBSERVATION. Observation status is judged to be reasonable and necessary in order to provide the required intensity of service to ensure the patient's safety. The patient's presenting symptoms, physical exam findings, and initial radiographic and laboratory data in the context of their medical condition is felt to place them at decreased risk for further clinical deterioration. Furthermore, it is anticipated that the patient will be medically stable for discharge from the hospital within 2 midnights of admission.        Date of Service 01/31/2022    Ivor Costa [facility]   If 7PM-7AM, please contact night-coverage [email or web address] 01/31/2022, 5:53 PM

## 2022-01-31 NOTE — Progress Notes (Signed)
PHARMACIST - PHYSICIAN ORDER COMMUNICATION  CONCERNING: P&T Medication Policy on Herbal Medications  DESCRIPTION:  This patient's order for:  [name]  has been noted.  This product(s) is classified as an "herbal" or natural product. Due to a lack of definitive safety studies or FDA approval, nonstandard manufacturing practices, plus the potential risk of unknown drug-drug interactions while on inpatient medications, the Pharmacy and Therapeutics Committee does not permit the use of "herbal" or natural products of this type within [facility].   ACTION TAKEN: The pharmacy department is unable to verify this order at this time and your patient has been informed of this safety policy. Please reevaluate patient's clinical condition at discharge and address if the herbal or natural product(s) should be resumed at that time.  Yusuke Beza Rodriguez-Guzman PharmD, BCPS 01/31/2022 4:38 PM

## 2022-01-31 NOTE — ED Provider Notes (Signed)
[facility]    CSN: [id number] Arrival date & time: 01/31/22  1036      History   Chief Complaint Chief Complaint  Patient presents with   Loss of Consciousness    HPI Jordan Thornton Gest is a 88 y.o. female.   HPI  88 year old female here for evaluation of syncope.  Patient is here with her husband and son and she reports that she was just finishing her shower this morning and while standing in front of the sink she started to see a Prisma blade before her eyes and then she passed out.  She states that the floor of her bathroom is a carpeted surface and she does not believe she struck her head.  She think she was only out for a few seconds but she is not really sure how long she was out.  She complains that she would like her legs have been feeling a little weak.  She is also had a decreased appetite over the last several days.  She did not eat breakfast this morning but did have juice.  She denies any dizziness, headache, chest pain, fatigue, changes to vision, nausea, vomiting, sweating, or shortness of breath.  Does not take aspirin or any blood thinners.  She does have a history of diabetes, hypertension, and stage III chronic kidney disease.  Past Medical History:  Diagnosis Date   Diabetes mellitus without complication ([place])    GERD (gastroesophageal reflux disease)    Hyperlipidemia    Hypertension     Patient Active Problem List   Diagnosis Date Noted   H/O total hysterectomy with bilateral salpingo-oophorectomy (BSO) 10/29/2020   Anxiety 10/29/2020   Acute hypoxemic respiratory failure due to COVID-19 ([place]) 09/19/2019   Stage 3 chronic kidney disease ([place]) 09/02/2019   Diabetes mellitus with no complication ([place]) 24/26/8341   Familial multiple lipoprotein-type hyperlipidemia 01/03/2015   Creatinine elevation 01/03/2015   Diabetes ([place]) 12/15/2014   Hyperlipidemia 12/15/2014   Hypertension 12/15/2014   Gastro-esophageal reflux disease without  esophagitis 12/15/2014    Past Surgical History:  Procedure Laterality Date   bladder tack     BREAST BIOPSY Left 10-15 yrs ago   benign   CATARACT EXTRACTION Bilateral 05/2015   CHOLECYSTECTOMY OPEN     VAGINAL HYSTERECTOMY      OB History   No obstetric history on file.      Home Medications    Prior to Admission medications   Medication Sig Start Date End Date Taking? Authorizing Provider  JANUVIA 25 MG tablet Take 1 tablet (25 mg total) by mouth daily. 11/04/21  Yes Juline Patch, MD  losartan (COZAAR) 25 MG tablet Take 25 mg by mouth daily. 02/06/21  Yes [provider]  simvastatin (ZOCOR) 20 MG tablet Take 1 tablet (20 mg total) by mouth daily at 6 PM. 07/03/21  Yes Juline Patch, MD  Acetaminophen (TYLENOL ARTHRITIS PAIN PO) Take 2 tablets by mouth daily as needed.     [provider]  ADVOCATE LANCETS MISC use once daily as directed to test blood sugar 04/24/15   Juline Patch, MD  Blood Glucose Monitoring Suppl (FREESTYLE LITE) DEVI use once daily as directed to test blood sugar 04/24/15   Juline Patch, MD  busPIRone (BUSPAR) 7.5 MG tablet Take 1 tablet (7.5 mg total) by mouth 2 (two) times daily. 01/27/22   Juline Patch, MD  Cholecalciferol (VITAMIN D) 2000 units CAPS Take 1 capsule by mouth daily. otc  [provider]  Elderberry 575 MG/5ML SYRP Take 1 Dose by mouth daily.     [provider]  FREESTYLE LITE test strip USE 1 STRIP TO CHECK GLUCOSE ONCE DAILY AS DIRECTED 11/26/21   Juline Patch, MD  indomethacin (INDOCIN) 50 MG capsule Take 1 capsule (50 mg total) by mouth 2 (two) times daily with a meal. 07/03/21   Juline Patch, MD  Lancet Devices (ADVOCATE LANCING DEVICE) MISC use once daily as directed to test blood sugar 04/24/15   Juline Patch, MD  lansoprazole (PREVACID) 15 MG capsule Take 1 capsule by mouth once daily Patient taking differently: otc 06/23/21   Juline Patch, MD  LORazepam (ATIVAN) 0.5 MG  tablet Take 1 tablet (0.5 mg total) by mouth as needed. Per 30 days 01/27/22   Juline Patch, MD  Omega-3 Fatty Acids (FISH OIL) 1000 MG CAPS Take 1 capsule (1,000 mg total) by mouth daily. 11/17/19   Juline Patch, MD    Family History Family History  Problem Relation Age of Onset   Heart disease Maternal Aunt    Heart disease Maternal Grandmother    Stroke Father    Breast cancer Neg Hx     Social History Social History   Tobacco Use   Smoking status: Former    Packs/day: 0.25    Years: 25.00    Pack years: 6.25    Types: Cigarettes    Quit date: 1982    Years since quitting: 41.4   Smokeless tobacco: Never   Tobacco comments:    smoking cessation materials not required  Vaping Use   Vaping Use: Never used  Substance Use Topics   Alcohol use: No    Alcohol/week: 0.0 standard drinks   Drug use: No     Allergies   Sertraline   Review of Systems Review of Systems  Constitutional:  Positive for appetite change. Negative for diaphoresis and fatigue.  Eyes:  Negative for visual disturbance.  Respiratory:  Negative for shortness of breath.   Cardiovascular:  Negative for chest pain.  Gastrointestinal:  Negative for nausea and vomiting.  Neurological:  Positive for syncope and headaches. Negative for dizziness.  Hematological: Negative.   Psychiatric/Behavioral: Negative.      Physical Exam Triage Vital Signs ED Triage Vitals  Enc Vitals Group     BP 01/31/22 1043 (!) 153/84     Pulse Rate 01/31/22 1043 92     Resp 01/31/22 1043 20     Temp 01/31/22 1043 97.6 F (36.4 C)     Temp Source 01/31/22 1043 Oral     SpO2 01/31/22 1043 99 %     Weight 01/31/22 1043 138 lb 0.1 oz (62.6 kg)     Height --      Head Circumference --      Peak Flow --      Pain Score 01/31/22 1046 0     Pain Loc --      Pain Edu? --      Excl. in [place]? --    No data found.  Updated Vital Signs BP (!) 153/84 (BP Location: Left Arm)   Pulse 92   Temp 97.6 F (36.4 C) (Oral)    Resp 20   Wt 138 lb 0.1 oz (62.6 kg)   SpO2 99%   BMI 22.97 kg/m   Visual Acuity Right Eye Distance:   Left Eye Distance:   Bilateral Distance:    Right Eye Near:  Left Eye Near:    Bilateral Near:     Physical Exam Vitals and nursing note reviewed.  Constitutional:      Appearance: Normal appearance. She is not ill-appearing.  HENT:     Head: Normocephalic and atraumatic.  Neck:     Vascular: No carotid bruit.  Cardiovascular:     Rate and Rhythm: Normal rate and regular rhythm.     Pulses: Normal pulses.     Heart sounds: Normal heart sounds. No murmur heard.   No friction rub. No gallop.  Pulmonary:     Effort: Pulmonary effort is normal.     Breath sounds: Normal breath sounds. No wheezing, rhonchi or rales.  Musculoskeletal:     Cervical back: Normal range of motion and neck supple.  Skin:    General: Skin is warm and dry.     Capillary Refill: Capillary refill takes less than 2 seconds.     Findings: No bruising, erythema or rash.  Neurological:     General: No focal deficit present.     Mental Status: She is alert and oriented to person, place, and time.     Cranial Nerves: No cranial nerve deficit.     Sensory: No sensory deficit.     Motor: No weakness.     Coordination: Coordination normal.  Psychiatric:        Mood and Affect: Mood normal.        Behavior: Behavior normal.        Thought Content: Thought content normal.        Judgment: Judgment normal.     UC Treatments / Results  Labs (all labs ordered are listed, but only abnormal results are displayed) Labs Reviewed  GLUCOSE, CAPILLARY - Abnormal; Notable for the following components:      Result Value   Glucose-Capillary 206 (*)    All other components within normal limits  CBG MONITORING, ED    EKG Sinus rhythm with occasional PVCs with a ventricular rate of 70 bpm Parable 174 ms QRS duration 102 ms QT/QTc 416/449 ms There is ST depression in V5 and V6.  No reciprocal elevation or  depression in II, III, or aVF. This is a change in rhythm from September 19, 2019.   Radiology No results found.  Procedures Procedures (including critical care time)  Medications Ordered in UC Medications - No data to display  Initial Impression / Assessment and Plan / UC Course  I have reviewed the triage vital signs and the nursing notes.  Pertinent labs & imaging results that were available during my care of the patient were reviewed by me and considered in my medical decision making (see chart for details).  Patient is a pleasant, 88 year old female with a history of diabetes, hypertension, and stage III chronic kidney disease who presents for evaluation of syncope this morning after her shower.  She denies any headache, dizziness, chest pain, fatigue, change in vision, nausea, vomiting, shortness breath, or sweating.  She does state that her legs feel weak.  Per her family's report she has been feeling unwell for the past week and has had a decreased appetite.  Patient did not eat breakfast this morning but she did have juice.  Fingerstick blood sugar was 6.  EKG was collected at triage which shows ST depression in leads V5 and V6.  There are no reciprocal changes present.  This is an interval change from previous EKG from 2021.  Physical exam reveals cranial nerves II through XII  intact.  Pupils are equal round reactive and EOMs intact.  Her scalp is atraumatic and there is no tenderness or hematomas palpated.  Bilateral grips and upper extremity strength are 5/5 in bilateral lower extremity strength is 5/5.  Cardiopulmonary exam reveals S1-S2 heart sounds with regular rate and rhythm and lung sounds that are clear to auscultation in all fields.  Given patient's age, feeling of unwell, and the EKG changes I feel she is best served by being evaluated in the emergency department.  I have spoken with her son and husband and they are both in agreement.  Due to the fact that she had a recent fall  and it is unclear if she had a head injury I am reluctant to give her aspirin at this time so I am holding it.  EMS has been contacted and is in route.  [place] on scene to assist patient in transport to the hospital.  I have given them report and care transferred.  I have attempted to call [place] [facility] to give report to the charge nurse but I was transferred to the phone tree.  Final Clinical Impressions(s) / UC Diagnoses   Final diagnoses:  Syncope, unspecified syncope type     Discharge Instructions      As we discussed, your EKG is not totally normal and because you had an episode of passing out I do feel that it is best to be evaluated in the emergency department and have your heart evaluated as a potential cause of your passing out.     ED Prescriptions   None    PDMP not reviewed this encounter.   Margarette Canada, NP 01/31/22 1130

## 2022-01-31 NOTE — ED Triage Notes (Signed)
Patient coming [facility] from [facility] for LOC.

## 2022-01-31 NOTE — Discharge Instructions (Addendum)
As we discussed, your EKG is not totally normal and because you had an episode of passing out I do feel that it is best to be evaluated in the emergency department and have your heart evaluated as a potential cause of your passing out.

## 2022-01-31 NOTE — Plan of Care (Signed)
  Problem: Education: Goal: Knowledge of General Education information will improve Description: Including pain rating scale, medication(s)/side effects and non-pharmacologic comfort measures Outcome: Progressing   Problem: Nutrition: Goal: Adequate nutrition will be maintained Outcome: Progressing   Problem: Activity: Goal: Risk for activity intolerance will decrease Outcome: Progressing   Problem: Pain Managment: Goal: General experience of comfort will improve Outcome: Progressing   Problem: Safety: Goal: Ability to remain free from injury will improve Outcome: Progressing   Problem: Skin Integrity: Goal: Risk for impaired skin integrity will decrease Outcome: Progressing

## 2022-01-31 NOTE — Assessment & Plan Note (Addendum)
2D echo 09/19/2019 showed EF 60 to 65% with grade 2 diastolic dysfunction.  Patient does not have shortness of breath, but the chest x-ray showed bilateral lower lobe interstitial prominence.  BNP is elevated 1841.  Patient does not seem to have acute CHF exacerbation, but obviously at risk of developing CHF exacerbation.  She was given 60 mg of IV Lasix.  No urinary output recorded. Slight worsening of renal function. -Stop IV Lasix -Follow-up 2D echo

## 2022-01-31 NOTE — Assessment & Plan Note (Signed)
-   Zocor 

## 2022-01-31 NOTE — Assessment & Plan Note (Signed)
Recent A1c 6.5, well controlled.  Patient is taking Januvia -Sliding scale insulin

## 2022-01-31 NOTE — Assessment & Plan Note (Addendum)
Etiology is not clear.  Possibly due to vasovagal syncope versus orthostatic status.  Orthostatic vitals were negative.  MRI was negative for any acute abnormalities.  Troponin barely positive with a flat curve most likely secondary to demand ischemia. Echocardiogram pending. -Continue with supportive care

## 2022-02-01 ENCOUNTER — Observation Stay
Admit: 2022-02-01 | Discharge: 2022-02-01 | Disposition: A | Discharge status: Home / Self Care | Payer: PPO | Attending: Internal Medicine | Admitting: Internal Medicine

## 2022-02-01 DIAGNOSIS — R9389 Abnormal findings on diagnostic imaging of other specified body structures: Secondary | ICD-10-CM | POA: Diagnosis not present

## 2022-02-01 DIAGNOSIS — F418 Other specified anxiety disorders: Secondary | ICD-10-CM | POA: Diagnosis not present

## 2022-02-01 DIAGNOSIS — R55 Syncope and collapse: Secondary | ICD-10-CM | POA: Diagnosis not present

## 2022-02-01 DIAGNOSIS — N390 Urinary tract infection, site not specified: Secondary | ICD-10-CM

## 2022-02-01 DIAGNOSIS — R778 Other specified abnormalities of plasma proteins: Secondary | ICD-10-CM | POA: Diagnosis not present

## 2022-02-01 DIAGNOSIS — E785 Hyperlipidemia, unspecified: Secondary | ICD-10-CM | POA: Diagnosis not present

## 2022-02-01 DIAGNOSIS — N184 Chronic kidney disease, stage 4 (severe): Secondary | ICD-10-CM | POA: Diagnosis not present

## 2022-02-01 LAB — LIPID PANEL
Cholesterol: 167 mg/dL (ref 0–200)
HDL: 59 mg/dL (ref >40)
LDL Cholesterol: 96 mg/dL (ref 0–99)
Total CHOL/HDL Ratio: 2.8 RATIO
Triglycerides: 61 mg/dL (ref <150)
VLDL: 12 mg/dL (ref 0–40)

## 2022-02-01 LAB — BASIC METABOLIC PANEL
Anion gap: 10 (ref 5–15)
BUN: 26 mg/dL — ABNORMAL HIGH (ref 8–23)
CO2: 24 mmol/L (ref 22–32)
Calcium: 9.1 mg/dL (ref 8.9–10.3)
Chloride: 108 mmol/L (ref 98–111)
Creatinine, Ser: 2.11 mg/dL — ABNORMAL HIGH (ref 0.44–1.00)
GFR, Estimated: 22 mL/min — ABNORMAL LOW (ref >60)
Glucose, Bld: 97 mg/dL (ref 70–99)
Potassium: 3.7 mmol/L (ref 3.5–5.1)
Sodium: 142 mmol/L (ref 135–145)

## 2022-02-01 LAB — GLUCOSE, CAPILLARY
Glucose-Capillary: 104 mg/dL — ABNORMAL HIGH (ref 70–99)
Glucose-Capillary: 128 mg/dL — ABNORMAL HIGH (ref 70–99)

## 2022-02-01 LAB — TROPONIN I (HIGH SENSITIVITY): Troponin I (High Sensitivity): 23 ng/L — ABNORMAL HIGH (ref <18)

## 2022-02-01 MED ORDER — CEPHALEXIN 500 MG PO CAPS: 500.0000 mg | ORAL_CAPSULE | Freq: Two times a day (BID) | ORAL | 0 refills | Status: DC

## 2022-02-01 MED ORDER — ASPIRIN 81 MG PO TBEC: 81.0000 mg | DELAYED_RELEASE_TABLET | Freq: Every day | ORAL | 12 refills | Status: DC

## 2022-02-01 NOTE — Discharge Summary (Signed)
Physician Discharge Summary   Patient: Jordan Thornton MRN: [id number] DOB: Jan 17, 1930  Admit date:     01/31/2022  Discharge date: 02/01/22  Discharge Physician: Lorella Nimrod   PCP: Juline Patch, MD   Recommendations at discharge:  Please obtain BMP and BNP in 1 week Follow-up with primary care provider within a week Pending final urine culture results and echocardiogram results  Discharge Diagnoses: Principal Problem:   Syncope Active Problems:   Urinary tract infection, acute   Elevated troponin   Hypertension   Chronic diastolic CHF (congestive heart failure) ([place])   Hyperlipidemia   Type II diabetes mellitus with renal manifestations ([place])   Depression with anxiety   CKD (chronic kidney disease), stage IV ([place])   Abnormal chest x-ray  Hospital Course: Taken from H&P.  Jordan McCoy Serda is a 88 y.o. female with medical history significant of HTN, HLD, DM, CKD-IV, dCHF, gout, depression with anxiety, GERD, who presents with syncope.    Pt states that she was finishing a shower this am, standing in front of sink, then everything started to blur & then passed out on the floor. No acute injury.  No headache or neck pain. She states that she passed out for about 1 minutes.  Patient denies dizziness.  No unilateral numbness or tinglings.  No facial droop or slurred speech.  Patient states that she had vision changes, described as "seeing yellow colored Prisma blade before her eyes".  Patient denies chest pain, cough, shortness breath.  Patient has poor appetite and decreased oral intake recently, has nausea, no vomiting, diarrhea or abdominal pain.  Denies symptoms of UTI.  She was seen in urgent care and per EMS she had EKG changes compared with previous and was referred to the ED for further work-up.     Data Reviewed and ED Course: pt was found to have WBC 6.5, troponin level 20, 22, BNP 1814, positive urinalysis (hazy appearance, small amount of leukocyte, many  bacteria, WBC 11-20), slightly worsening renal function, temperature normal, blood pressure 136/90, heart rate 76, RR 23, oxygen saturation 100% on room air.  Chest x-ray showed bilateral lower lobe interstitial prominence.  Patient is placed on telemetry bed for observation.  EKG with sinus rhythm, QTc 450, LAD, T wave inversion in V4 to V6, lead II/3 in the lateral leads. Echocardiogram, PT/OT evaluation ordered. Prior echocardiogram with normal EF and grade 1 diastolic dysfunction. Elevated BNP.  Patient also has stage IV renal disease.  Slight bilateral lower lung interstitial prominence, she was given Lasix.  Concern of vasovagal versus orthostatic vitals.  MRI brain was obtained to rule out any stroke and it was negative for any acute intracranial abnormalities.  Had chronic small vessel ischemia and cerebral volume loss.  She was also started on Rocephin for concern of UTI, pending urine culture  5/21: Blood cultures negative in 24 hours, urine cultures growing more than 100,000 colonies of E. coli, pending susceptibility.  Labs with slight increase in BUN and creatinine, 26 and 2.11.  Baseline between 1.7-1.9.  Lipid panel unremarkable.  A1c at 6.5  Patient was feeling back to her baseline.  Denies any chest pain or shortness of breath.  Not much urinary symptoms.  Orthostatic vitals remain negative.  May be vasovagal after taking the shower.  Patient lives with her husband and would like to go back home today. Echocardiogram done-pending results. Repeat EKG with persistent T wave inversion in lateral leads.  Troponin barely positive with a flat curve so  less likely ACS.  Patient received ceftriaxone for concern of UTI and discharged on Keflex to complete a 5-day course.  She was also started on low-dose aspirin and will continue her statin.  She also received a dose of IV Lasix 60 mg, resulted in slight worsening of BUN and creatinine but remained close to her baseline.  She was advised  to keep herself well-hydrated and need a close follow-up with her primary care provider and nephrologist for further recommendations.  She will continue on current medications and follow-up with her providers.   Assessment and Plan: * Syncope Etiology is not clear.  Possibly due to vasovagal syncope versus orthostatic status.  Orthostatic vitals were negative.  MRI was negative for any acute abnormalities.  Troponin barely positive with a flat curve most likely secondary to demand ischemia. Echocardiogram pending. -Continue with supportive care  Urinary tract infection, acute No prior history of urine infection on chart review -rocephin-can be switched to Keflex upon discharge -f/u Urine culture  Elevated troponin Troponin level 20, 22.  Possibly due to demand ischemia.  No chest pain. A1c of 6.5, lipid panel unremarkable.  Echocardiogram pending -Continue aspirin, Zocor -Follow-up 2D echo  Hypertension - IV hydralazine as needed -Hold Cozaar due to worsening renal function -Also holding IV Lasix   Chronic diastolic CHF (congestive heart failure) ([place]) 2D echo 09/19/2019 showed EF 60 to 65% with grade 2 diastolic dysfunction.  Patient does not have shortness of breath, but the chest x-ray showed bilateral lower lobe interstitial prominence.  BNP is elevated 1841.  Patient does not seem to have acute CHF exacerbation, but obviously at risk of developing CHF exacerbation.  She was given 60 mg of IV Lasix.  No urinary output recorded. Slight worsening of renal function. -Stop IV Lasix -Follow-up 2D echo  Hyperlipidemia - Zocor  Type II diabetes mellitus with renal manifestations ([facility]) Recent A1c 6.5, well controlled.  Patient is taking Januvia -Sliding scale insulin  Depression with anxiety - As needed Ativan  CKD (chronic kidney disease), stage IV ([place]) Sedated was not any baseline.  Baseline creatinine 1.7-1.9 recently.  Slight worsening of creatinine to 2.11 and BUN of 26  today. -Discontinue IV Lasix -Keep holding Cozaar and indomethacin   Consultants: None Procedures performed: None Disposition: Home Diet recommendation:  Discharge Diet Orders (From admission, onward)     Start     Ordered   02/01/22 0000  Diet - low sodium heart healthy        02/01/22 1350           Cardiac and Carb modified diet DISCHARGE MEDICATION: Allergies as of 02/01/2022       Reactions   Sertraline    Made her more depressed        Medication List     STOP taking these medications    busPIRone 7.5 MG tablet Commonly known as: BUSPAR   indomethacin 50 MG capsule Commonly known as: INDOCIN       TAKE these medications    Advocate Lancets Misc use once daily as directed to test blood sugar   Advocate Lancing Device Misc use once daily as directed to test blood sugar   aspirin EC 81 MG tablet Take 1 tablet (81 mg total) by mouth daily. Swallow whole. Start taking on: Feb 02, 2022   cephALEXin 500 MG capsule Commonly known as: KEFLEX Take 1 capsule (500 mg total) by mouth 2 (two) times daily for 4 days.   Elderberry 575 MG/5ML Syrp  Take 1 Dose by mouth daily.   Fish Oil 1000 MG Caps Take 1 capsule (1,000 mg total) by mouth daily.   FreeStyle [employer] use once daily as directed to test blood sugar   FREESTYLE LITE test strip Generic drug: glucose blood USE 1 STRIP TO CHECK GLUCOSE ONCE DAILY AS DIRECTED   Januvia 25 MG tablet Generic drug: sitaGLIPtin Take 1 tablet (25 mg total) by mouth daily.   lansoprazole 15 MG capsule Commonly known as: PREVACID Take 1 capsule by mouth once daily What changed:  how much to take how to take this when to take this additional instructions   LORazepam 0.5 MG tablet Commonly known as: ATIVAN Take 1 tablet (0.5 mg total) by mouth as needed. Per 30 days   losartan 25 MG tablet Commonly known as: COZAAR Take 25 mg by mouth daily.   simvastatin 20 MG tablet Commonly known as: ZOCOR Take  1 tablet (20 mg total) by mouth daily at 6 PM.   TYLENOL ARTHRITIS PAIN PO Take 2 tablets by mouth daily as needed.   Vitamin D 50 MCG (2000 UT) Caps Take 1 capsule by mouth daily. otc        Follow-up Information     Juline Patch, MD. Schedule an appointment as soon as possible for a visit in 1 week(s).   Specialty: Family Medicine Contact information: [address] [place] [address] [phone]                Discharge Exam: Danley Danker Weights   01/31/22 1211 02/01/22 0524  Weight: 62.6 kg 57.4 kg   General.     In no acute distress. Pulmonary.  Lungs clear bilaterally, normal respiratory effort. CV.  Regular rate and rhythm, no JVD, rub or murmur. Abdomen.  Soft, nontender, nondistended, BS positive. CNS.  Alert and oriented .  No focal neurologic deficit. Extremities.  No edema, no cyanosis, pulses intact and symmetrical. Psychiatry.  Judgment and insight appears normal.   Condition at discharge: stable  The results of significant diagnostics from this hospitalization (including imaging, microbiology, ancillary and laboratory) are listed below for reference.   Imaging Studies: DG Chest 2 View  Result Date: 01/31/2022 CLINICAL DATA:  Syncope EXAM: CHEST - 2 VIEW COMPARISON:  Chest radiograph dated 10/09/2019. FINDINGS: The heart size and mediastinal contours are within normal limits. Mild bilateral lower lung predominant interstitial and airspace opacities are noted. Trace bilateral pleural effusions are noted. There is no pneumothorax. Degenerative changes are seen in the spine. IMPRESSION: Mild bilateral lower lung predominant interstitial and airspace opacities may represent pneumonia. Trace bilateral pleural effusions. Electronically Signed   By: Zerita Boers M.D.   On: 01/31/2022 13:21   MR BRAIN WO CONTRAST  Result Date: 01/31/2022 CLINICAL DATA:  Syncope EXAM: MRI HEAD WITHOUT CONTRAST TECHNIQUE: Multiplanar, multiecho pulse sequences of  the brain and surrounding structures were obtained without intravenous contrast. COMPARISON:  None Available. FINDINGS: Brain: No acute infarct, mass effect or extra-axial collection. No acute or chronic hemorrhage. There is multifocal hyperintense T2-weighted signal within the white matter. Generalized cerebral volume loss. The midline structures are normal. Vascular: Major flow voids are preserved. Skull and upper cervical spine: Normal calvarium and skull base. Visualized upper cervical spine and soft tissues are normal. Sinuses/Orbits:No paranasal sinus fluid levels or advanced mucosal thickening. No mastoid or middle ear effusion. Normal orbits. IMPRESSION: 1. No acute intracranial abnormality. 2. Findings of chronic small vessel ischemia and cerebral volume loss. Electronically Signed  By: Ulyses Jarred M.D.   On: 01/31/2022 22:01    Microbiology: Results for orders placed or performed during the hospital encounter of 01/31/22  Urine Culture     Status: Abnormal (Preliminary result)   Collection Time: 01/31/22  1:56 PM   Specimen: Urine, Random  Result Value Ref Range Status   Specimen Description   Final    URINE, RANDOM Performed at [facility], [address], [place], [place] [address]    Special Requests   Final    NONE Performed at [facility], [address] [place], [place] [address]    Culture (A)  Final    >=100,000 COLONIES/mL ESCHERICHIA COLI CULTURE REINCUBATED FOR BETTER GROWTH SUSCEPTIBILITIES TO FOLLOW Performed at [facility], [place] [address] [place], [place] [address]    Report Status PENDING  Incomplete  Blood culture (routine x 2)     Status: None (Preliminary result)   Collection Time: 01/31/22  2:00 PM   Specimen: BLOOD  Result Value Ref Range Status   Specimen Description BLOOD [facility]  Final   Special Requests BOTTLES DRAWN AEROBIC AND ANAEROBIC BCHV  Final   Culture   Final    NO GROWTH < 24 HOURS Performed at [facility], [address] [place], [place] [address]    Report Status PENDING  Incomplete  Blood culture (routine x 2)     Status: None (Preliminary result)   Collection Time: 01/31/22  2:00 PM   Specimen: BLOOD  Result Value Ref Range Status   Specimen Description BLOOD RW  Final   Special Requests BOTTLES DRAWN AEROBIC AND ANAEROBIC BCHV  Final   Culture   Final    NO GROWTH < 24 HOURS Performed at [facility], [address] [place],  [address]    Report Status PENDING  Incomplete    Labs: CBC: Recent Labs  Lab 01/31/22 1214  WBC 6.5  NEUTROABS 4.8  HGB 11.8*  HCT 36.6  MCV 79.4*  PLT 825   Basic Metabolic Panel: Recent Labs  Lab 01/31/22 1214 02/01/22 0436  NA 138 142  K 4.2 3.7  CL 105 108  CO2 25 24  GLUCOSE 174* 97  BUN 22 26*  CREATININE 2.07* 2.11*  CALCIUM 9.2 9.1   Liver Function Tests: Recent Labs  Lab 01/31/22 1214  AST 19  ALT 15  ALKPHOS 53  BILITOT 1.2  PROT 7.0  ALBUMIN 4.0   CBG: Recent Labs  Lab 01/31/22 1210 01/31/22 1532 01/31/22 2120 02/01/22 0801 02/01/22 1203  GLUCAP 168* 130* 89 104* 128*    Discharge time spent: greater than 30 minutes.  This record has been created using Systems analyst. Errors have been sought and corrected,but may not always be located. Such creation errors do not reflect on the standard of care.   Signed: Lorella Nimrod, MD [facility] 02/01/2022

## 2022-02-01 NOTE — Hospital Course (Addendum)
Taken from H&P.  Jordan Thornton is a 88 y.o. female with medical history significant of HTN, HLD, DM, CKD-IV, dCHF, gout, depression with anxiety, GERD, who presents with syncope.    Pt states that she was finishing a shower this am, standing in front of sink, then everything started to blur & then passed out on the floor. No acute injury.  No headache or neck pain. She states that she passed out for about 1 minutes.  Patient denies dizziness.  No unilateral numbness or tinglings.  No facial droop or slurred speech.  Patient states that she had vision changes, described as "seeing yellow colored Prisma blade before her eyes".  Patient denies chest pain, cough, shortness breath.  Patient has poor appetite and decreased oral intake recently, has nausea, no vomiting, diarrhea or abdominal pain.  Denies symptoms of UTI.  She was seen in urgent care and per EMS she had EKG changes compared with previous and was referred to the ED for further work-up.     Data Reviewed and ED Course: pt was found to have WBC 6.5, troponin level 20, 22, BNP 1814, positive urinalysis (hazy appearance, small amount of leukocyte, many bacteria, WBC 11-20), slightly worsening renal function, temperature normal, blood pressure 136/90, heart rate 76, RR 23, oxygen saturation 100% on room air.  Chest x-ray showed bilateral lower lobe interstitial prominence.  Patient is placed on telemetry bed for observation.  EKG with sinus rhythm, QTc 450, LAD, T wave inversion in V4 to V6, lead II/3 in the lateral leads. Echocardiogram, PT/OT evaluation ordered. Prior echocardiogram with normal EF and grade 1 diastolic dysfunction. Elevated BNP.  Patient also has stage IV renal disease.  Slight bilateral lower lung interstitial prominence, she was given Lasix.  Concern of vasovagal versus orthostatic vitals.  MRI brain was obtained to rule out any stroke and it was negative for any acute intracranial abnormalities.  Had chronic small  vessel ischemia and cerebral volume loss.  She was also started on Rocephin for concern of UTI, pending urine culture  5/21: Blood cultures negative in 24 hours, urine cultures growing more than 100,000 colonies of E. coli, pending susceptibility.  Labs with slight increase in BUN and creatinine, 26 and 2.11.  Baseline between 1.7-1.9.  Lipid panel unremarkable.  A1c at 6.5  Patient was feeling back to her baseline.  Denies any chest pain or shortness of breath.  Not much urinary symptoms.  Orthostatic vitals remain negative.  May be vasovagal after taking the shower.  Patient lives with her husband and would like to go back home today. Echocardiogram done-pending results. Repeat EKG with persistent T wave inversion in lateral leads.  Troponin barely positive with a flat curve so less likely ACS.  Patient received ceftriaxone for concern of UTI and discharged on Keflex to complete a 5-day course.  She was also started on low-dose aspirin and will continue her statin.  She also received a dose of IV Lasix 60 mg, resulted in slight worsening of BUN and creatinine but remained close to her baseline.  She was advised to keep herself well-hydrated and need a close follow-up with her primary care provider and nephrologist for further recommendations.  She will continue on current medications and follow-up with her providers.

## 2022-02-01 NOTE — Progress Notes (Signed)
*  PRELIMINARY RESULTS* Echocardiogram 2D Echocardiogram has been performed.  Claretta Fraise 02/01/2022, 12:37 PM

## 2022-02-01 NOTE — TOC Transition Note (Signed)
Transition of Care [facility]) - CM/SW Discharge Note   Patient Details  Name: Jordan Thornton MRN: [id number] Date of Birth: 1930-05-30  Transition of Care [facility]) CM/SW Contact:  Izola Price, RN Phone Number: 02/01/2022, 2:00 PM   Clinical Narrative: 5/21: Discharge today to home/self care. Follow up recommendation given per provider: Insured. Has a PCP.  Recommendations at discharge per discharge summary.  Please obtain BMP and BNP in 1 week Follow-up with primary care provider within a week Pending final urine culture results and echocardiogram results PCP: Juline Patch, MD  Local RX:  [facility] Tse Bonito, Houstonia RN CM   Final next level of care: Home/Self Care Barriers to Discharge: Barriers Resolved   Patient Goals and CMS Choice     Choice offered to / list presented to : NA  Discharge Placement                       Discharge Plan and Services                DME Arranged: N/A DME Agency: NA       HH Arranged: NA HH Agency: NA        Social Determinants of Health (SDOH) Interventions     Readmission Risk Interventions     View : No data to display.

## 2022-02-02 ENCOUNTER — Telehealth: Payer: Self-pay

## 2022-02-02 LAB — ECHOCARDIOGRAM COMPLETE
AR max vel: 1.63 cm2
AV Peak grad: 4 mmHg
Ao pk vel: 1 m/s
Area-P 1/2: 1.7 cm2
Calc EF: 39.7 %
Height: 65 in
S' Lateral: 3.8 cm
Single Plane A2C EF: 33.1 %
Single Plane A4C EF: 43.9 %
Weight: 2024.7 oz

## 2022-02-02 LAB — URINE CULTURE: Culture: >=100000 — AB

## 2022-02-02 NOTE — Progress Notes (Signed)
Pharmacy Note  Patient discharged 02/01/22 on cephalexin and urine culture sensitivities now available.  Urine Cx: E.coli: resistant to Ampicillin, Cefazolin, Unasyn  MD would like to change antibiotic from cephalexin to Ciprofloxacin 500mg  PO Once a day x 3 days.  New prescription called in to [facility] on [address] [phone]  Called patient at [phone] and discussed stopping the cephalexin and to start the new prescription for Ciprofloxacin, (avoiding dairy and products with calcium and iron).  Chinita Greenland PharmD Clinical Pharmacist 02/02/2022

## 2022-02-02 NOTE — Telephone Encounter (Signed)
Transition Care Management Follow-up Telephone Call Date of discharge and from where: 02/01/22 [facility]  How have you been since you were released from the hospital? Pt states she is doing pretty good Any questions or concerns? No  Items Reviewed: Did the pt receive and understand the discharge instructions provided? Yes  Medications obtained and verified? Yes  Other? No  Any new allergies since your discharge? No  Dietary orders reviewed? Yes Do you have support at home? Yes   Home Care and Equipment/Supplies: Were home health services ordered? no   Were any new equipment or medical supplies ordered?  No  Functional Questionnaire: (I = Independent and D = Dependent) ADLs: I  Bathing/Dressing- I  Meal Prep- I  Eating- I  Maintaining continence- I  Transferring/Ambulation- I  Managing Meds- I  Follow up appointments reviewed:  PCP Hospital f/u appt confirmed? Yes  Scheduled to see Dr.  Ronnald Ramp on 02/06/22 @ 10:00. Are transportation arrangements needed? No  If their condition worsens, is the pt aware to call PCP or go to the Emergency Dept.? Yes Was the patient provided with contact information for the PCP's office or ED? Yes Was to pt encouraged to call back with questions or concerns? Yes

## 2022-02-05 LAB — CULTURE, BLOOD (ROUTINE X 2)
Culture: NO GROWTH
Culture: NO GROWTH

## 2022-02-06 ENCOUNTER — Encounter: Payer: Self-pay | Admitting: Family Medicine

## 2022-02-06 ENCOUNTER — Ambulatory Visit (INDEPENDENT_AMBULATORY_CARE_PROVIDER_SITE_OTHER): Payer: PPO | Admitting: Family Medicine

## 2022-02-06 VITALS — BP 118/70 | HR 69 | Ht 65.0 in | Wt 132.0 lb

## 2022-02-06 DIAGNOSIS — E860 Dehydration: Secondary | ICD-10-CM | POA: Diagnosis not present

## 2022-02-06 DIAGNOSIS — J189 Pneumonia, unspecified organism: Secondary | ICD-10-CM

## 2022-02-06 DIAGNOSIS — R55 Syncope and collapse: Secondary | ICD-10-CM

## 2022-02-06 NOTE — Progress Notes (Signed)
Date:  02/06/2022   Name:  Jordan Thornton   DOB:  01-11-1930   MRN:  [id number]   Chief Complaint: Pneumonia (Kidney infection follow up- finished antibiotic yesterday) [facility] Discharge Acute Issues Care Follow Up                                                                        Patient Demographics  Jordan McCoy Magowan, is a 88 y.o. female  DOB 1930/01/07  MRN [id number].  Primary MD  Juline Patch, MD   Reason for TCC follow Up -reviewed vital signs and recheck blood pressure.  Recheck for pneumonia possibility.  At this point time is a little too early to recheck either chest x-ray or renal function panel and patient has been encouraged to continue fluids and we will be rechecking in 2 to 3 weeks.  Patient is asymptomatic at this time with no further symptomatology of dizziness, chest pain, shortness of breath, or malaise.   Past Medical History:  Diagnosis Date   Diabetes mellitus without complication ([place])    GERD (gastroesophageal reflux disease)    Hyperlipidemia    Hypertension     Past Surgical History:  Procedure Laterality Date   bladder tack     BREAST BIOPSY Left 10-15 yrs ago   benign   CATARACT EXTRACTION Bilateral 05/2015   CHOLECYSTECTOMY OPEN     VAGINAL HYSTERECTOMY         Recent HPI and Hospital Course  Patient was admitted with syncopal episodes and incidental finding included A pneumonia possibility.  Patient was placed on antibiotics and has recently discontinued and is doing well at this time [place] to be followed in the [facility] care has been good with complete resolution of previous symptoms of dizziness, near syncope or orthostatic symptoms.  Patient is doing well is back to her baseline and we will recheck chest x-ray in 2 to 3 weeks to make sure this has returned to normal  as well as lab work at that time.  Patient is unable to have a urine at this time   Subjective:   Jordan Riche today has, No headache, No chest pain, No abdominal pain - No Nausea, No new weakness tingling or numbness, No Cough - SOB.  Assessment & Plan         Objective:   Vitals:   02/06/22 0950  BP: 118/70  Pulse: 69  SpO2: 99%  Weight: 132 lb (59.9 kg)  Height: _0  (1.651 m)    Wt Readings from Last 3 Encounters:  02/06/22 132 lb (59.9 kg)  02/01/22 126 lb 8.7 oz (57.4 kg)  01/31/22 138 lb 0.1 oz (62.6 kg)    Allergies as of 02/06/2022       Reactions   Sertraline    Made her more depressed        Medication List  Accurate as of Feb 06, 2022 10:29 AM. If you have any questions, ask your nurse or doctor.          STOP taking these medications    ciprofloxacin 500 MG tablet Commonly known as: CIPRO Stopped by: Otilio Miu, MD       TAKE these medications    Advocate Lancets Misc use once daily as directed to test blood sugar   Advocate Lancing Device Misc use once daily as directed to test blood sugar   aspirin EC 81 MG tablet Take 1 tablet (81 mg total) by mouth daily. Swallow whole.   Elderberry 575 MG/5ML Syrp Take 1 Dose by mouth daily.   Fish Oil 1000 MG Caps Take 1 capsule (1,000 mg total) by mouth daily.   FreeStyle [employer] use once daily as directed to test blood sugar   FREESTYLE LITE test strip Generic drug: glucose blood USE 1 STRIP TO CHECK GLUCOSE ONCE DAILY AS DIRECTED   Januvia 25 MG tablet Generic drug: sitaGLIPtin Take 1 tablet (25 mg total) by mouth daily.   lansoprazole 15 MG capsule Commonly known as: PREVACID Take 1 capsule by mouth once daily What changed:  how much to take how to take this when to take this additional instructions   LORazepam 0.5 MG tablet Commonly known as: ATIVAN Take 1 tablet (0.5 mg total) by mouth as needed. Per 30 days   losartan 25 MG tablet Commonly  known as: COZAAR Take 25 mg by mouth daily.   simvastatin 20 MG tablet Commonly known as: ZOCOR Take 1 tablet (20 mg total) by mouth daily at 6 PM.   TYLENOL ARTHRITIS PAIN PO Take 2 tablets by mouth daily as needed.   Vitamin D 50 MCG (2000 UT) Caps Take 1 capsule by mouth daily. otc         Physical Exam: Constitutional: Patient appears well-developed and well-nourished. Not in obvious distress. HENT: Normocephalic, atraumatic, External right and left ear normal. Oropharynx is clear and moist.  Eyes: Conjunctivae and EOM are normal. PERRLA, no scleral icterus. Neck: Normal ROM. Neck supple. No JVD. No tracheal deviation. No thyromegaly. CVS: RRR, S1/S2 +, no murmurs, no gallops, no carotid bruit.  Pulmonary: Effort and breath sounds normal, no stridor, rhonchi, wheezes, rales.  Abdominal: Soft. BS +, no distension, tenderness, rebound or guarding.  Musculoskeletal: Normal range of motion. No edema and no tenderness.  Lymphadenopathy: No lymphadenopathy noted, cervical, inguinal or axillary Neuro: Alert. Normal reflexes, muscle tone coordination. No cranial nerve deficit. Skin: Skin is warm and dry. No rash noted. Not diaphoretic. No erythema. No pallor. Psychiatric: Normal mood and affect. Behavior, judgment, thought content normal.   Data Review   Micro Results Recent Results (from the past 240 hour(s))  Urine Culture     Status: Abnormal   Collection Time: 01/31/22  1:56 PM   Specimen: Urine, Random  Result Value Ref Range Status   Specimen Description   Final    URINE, RANDOM Performed at [facility], [address] [place], [place] [address]    Special Requests   Final    NONE Performed at [facility], [address] [place], [place] [address]    Culture >=100,000 COLONIES/mL ESCHERICHIA COLI (A)  Final   Report Status 02/02/2022 FINAL  Final   Organism ID, Bacteria ESCHERICHIA COLI (A)  Final      Susceptibility   Escherichia coli -  MIC*    AMPICILLIN >=32 RESISTANT Resistant  CEFAZOLIN >=64 RESISTANT Resistant     CEFEPIME <=0.12 SENSITIVE Sensitive     CEFTRIAXONE <=0.25 SENSITIVE Sensitive     CIPROFLOXACIN <=0.25 SENSITIVE Sensitive     GENTAMICIN <=1 SENSITIVE Sensitive     IMIPENEM <=0.25 SENSITIVE Sensitive     NITROFURANTOIN <=16 SENSITIVE Sensitive     TRIMETH/SULFA <=20 SENSITIVE Sensitive     AMPICILLIN/SULBACTAM >=32 RESISTANT Resistant     PIP/TAZO <=4 SENSITIVE Sensitive     * >=100,000 COLONIES/mL ESCHERICHIA COLI  Blood culture (routine x 2)     Status: None   Collection Time: 01/31/22  2:00 PM   Specimen: BLOOD  Result Value Ref Range Status   Specimen Description BLOOD [facility]  Final   Special Requests BOTTLES DRAWN AEROBIC AND ANAEROBIC [place]  Final   Culture   Final    NO GROWTH 5 DAYS Performed at [facility], [address], [place], [place] [address]    Report Status 02/05/2022 FINAL  Final  Blood culture (routine x 2)     Status: None   Collection Time: 01/31/22  2:00 PM   Specimen: BLOOD  Result Value Ref Range Status   Specimen Description BLOOD RW  Final   Special Requests BOTTLES DRAWN AEROBIC AND ANAEROBIC [place]  Final   Culture   Final    NO GROWTH 5 DAYS Performed at [facility], [address] [place], [place] [address]    Report Status 02/05/2022 FINAL  Final     CBC Recent Labs  Lab 01/31/22 1214  WBC 6.5  HGB 11.8*  HCT 36.6  PLT 178  MCV 79.4*  MCH 25.6*  MCHC 32.2  RDW 14.7  LYMPHSABS 1.3  MONOABS 0.4  EOSABS 0.0  BASOSABS 0.0    Chemistries  Recent Labs  Lab 01/31/22 1214 02/01/22 0436  NA 138 142  K 4.2 3.7  CL 105 108  CO2 25 24  GLUCOSE 174* 97  BUN 22 26*  CREATININE 2.07* 2.11*  CALCIUM 9.2 9.1  AST 19  --   ALT 15  --   ALKPHOS 53  --   BILITOT 1.2  --    ------------------------------------------------------------------------------------------------------------------ estimated creatinine clearance is 15.9  mL/min (A) (by C-G formula based on SCr of 2.11 mg/dL (H)). ------------------------------------------------------------------------------------------------------------------ No results for input(s): HGBA1C in the last 72 hours. ------------------------------------------------------------------------------------------------------------------ No results for input(s): CHOL, HDL, LDLCALC, TRIG, CHOLHDL, LDLDIRECT in the last 72 hours. ------------------------------------------------------------------------------------------------------------------ No results for input(s): TSH, T4TOTAL, T3FREE, THYROIDAB in the last 72 hours.  Invalid input(s): FREET3 ------------------------------------------------------------------------------------------------------------------ No results for input(s): VITAMINB12, FOLATE, FERRITIN, TIBC, IRON, RETICCTPCT in the last 72 hours.  Coagulation profile No results for input(s): INR, PROTIME in the last 168 hours.  No results for input(s): DDIMER in the last 72 hours.  Cardiac Enzymes No results for input(s): CKMB, TROPONINI, MYOGLOBIN in the last 168 hours.  Invalid input(s): CK ------------------------------------------------------------------------------------------------------------------ Invalid input(s): POCBNP   Time Spent in minutes  [place] M.D on 02/06/2022 at 10:29 AM   Disclaimer: This note may have been dictated with voice recognition software. Similar sounding words can inadvertently be transcribed and this note may contain transcription errors which may not have been corrected upon publication of note.   Pt was recently admitted to [facility] hospital for syncopal episode associated with a pneumonia on 5/20 and was discharged on 5/21 . Transition of care call placed on 5/22.     Pneumonia There is no chest tightness, cough, difficulty breathing,  frequent throat clearing, hemoptysis, hoarse voice, shortness of breath, sputum  production or wheezing. This is a chronic problem. The current episode started more than 1 year ago. Pertinent negatives include no chest pain, ear pain, fever, headaches, myalgias, rhinorrhea, sneezing or sore throat. She reports moderate improvement on treatment.   Lab Results  Component Value Date   NA 142 02/01/2022   K 3.7 02/01/2022   CO2 24 02/01/2022   GLUCOSE 97 02/01/2022   BUN 26 (H) 02/01/2022   CREATININE 2.11 (H) 02/01/2022   CALCIUM 9.1 02/01/2022   EGFR 27 (L) 07/03/2021   GFRNONAA 22 (L) 02/01/2022   Lab Results  Component Value Date   CHOL 167 02/01/2022   HDL 59 02/01/2022   LDLCALC 96 02/01/2022   TRIG 61 02/01/2022   CHOLHDL 2.8 02/01/2022   No results found for: TSH Lab Results  Component Value Date   HGBA1C 6.5 (H) 01/31/2022   Lab Results  Component Value Date   WBC 6.5 01/31/2022   HGB 11.8 (L) 01/31/2022   HCT 36.6 01/31/2022   MCV 79.4 (L) 01/31/2022   PLT 178 01/31/2022   Lab Results  Component Value Date   ALT 15 01/31/2022   AST 19 01/31/2022   ALKPHOS 53 01/31/2022   BILITOT 1.2 01/31/2022   No results found for: 25OHVITD2, 25OHVITD3, VD25OH   Review of Systems  Constitutional: Negative.  Negative for chills, fatigue, fever and unexpected weight change.  HENT:  Negative for congestion, ear discharge, ear pain, hoarse voice, rhinorrhea, sinus pressure, sneezing and sore throat.   Respiratory:  Negative for cough, hemoptysis, sputum production, chest tightness, shortness of breath, wheezing and stridor.   Cardiovascular:  Negative for chest pain.  Gastrointestinal:  Negative for abdominal pain, blood in stool, constipation, diarrhea and nausea.  Genitourinary:  Negative for dysuria, flank pain, frequency, hematuria, urgency and vaginal discharge.  Musculoskeletal:  Negative for arthralgias, back pain and myalgias.  Skin:  Negative for rash.  Neurological:  Negative for dizziness, weakness and headaches.  Hematological:  Negative for  adenopathy. Does not bruise/bleed easily.  Psychiatric/Behavioral:  Negative for dysphoric mood. The patient is not nervous/anxious.    Patient Active Problem List   Diagnosis Date Noted   Abnormal chest x-ray    Syncope 01/31/2022   Type II diabetes mellitus with renal manifestations ([place]) 01/31/2022   Depression with anxiety 01/31/2022   CKD (chronic kidney disease), stage IV ([facility]) 01/31/2022   Chronic diastolic CHF (congestive heart failure) ([place]) 01/31/2022   Urinary tract infection, acute 01/31/2022   Elevated troponin 01/31/2022   H/O total hysterectomy with bilateral salpingo-oophorectomy (BSO) 10/29/2020   Anxiety 10/29/2020   Acute hypoxemic respiratory failure due to COVID-19 ([place]) 09/19/2019   Stage 3 chronic kidney disease ([place]) 09/02/2019   Diabetes mellitus with no complication ([place]) 40/98/1191   Familial multiple lipoprotein-type hyperlipidemia 01/03/2015   Creatinine elevation 01/03/2015   Diabetes ([place]) 12/15/2014   Hyperlipidemia 12/15/2014   Hypertension 12/15/2014   Gastro-esophageal reflux disease without esophagitis 12/15/2014    Allergies  Allergen Reactions   Sertraline     Made her more depressed    Past Surgical History:  Procedure Laterality Date   bladder tack     BREAST BIOPSY Left 10-15 yrs ago   benign   CATARACT EXTRACTION Bilateral 05/2015   CHOLECYSTECTOMY OPEN     VAGINAL HYSTERECTOMY      Social History   Tobacco Use   Smoking status: Former    Packs/day: 0.25  Years: 25.00    Pack years: 6.25    Types: Cigarettes    Quit date: 47    Years since quitting: 41.4   Smokeless tobacco: Never   Tobacco comments:    smoking cessation materials not required  Vaping Use   Vaping Use: Never used  Substance Use Topics   Alcohol use: No    Alcohol/week: 0.0 standard drinks   Drug use: No     Medication list has been reviewed and updated.  Current Meds  Medication Sig   Acetaminophen (TYLENOL ARTHRITIS PAIN PO) Take 2  tablets by mouth daily as needed.    ADVOCATE LANCETS MISC use once daily as directed to test blood sugar   aspirin EC 81 MG tablet Take 1 tablet (81 mg total) by mouth daily. Swallow whole.   Blood Glucose Monitoring Suppl (FREESTYLE LITE) DEVI use once daily as directed to test blood sugar   Cholecalciferol (VITAMIN D) 2000 units CAPS Take 1 capsule by mouth daily. otc   Elderberry 575 MG/5ML SYRP Take 1 Dose by mouth daily.    FREESTYLE LITE test strip USE 1 STRIP TO CHECK GLUCOSE ONCE DAILY AS DIRECTED   JANUVIA 25 MG tablet Take 1 tablet (25 mg total) by mouth daily.   Lancet Devices (ADVOCATE LANCING DEVICE) MISC use once daily as directed to test blood sugar   lansoprazole (PREVACID) 15 MG capsule Take 1 capsule by mouth once daily (Patient taking differently: otc)   LORazepam (ATIVAN) 0.5 MG tablet Take 1 tablet (0.5 mg total) by mouth as needed. Per 30 days   losartan (COZAAR) 25 MG tablet Take 25 mg by mouth daily.   Omega-3 Fatty Acids (FISH OIL) 1000 MG CAPS Take 1 capsule (1,000 mg total) by mouth daily.   simvastatin (ZOCOR) 20 MG tablet Take 1 tablet (20 mg total) by mouth daily at 6 PM.   [DISCONTINUED] ciprofloxacin (CIPRO) 500 MG tablet Take 500 mg by mouth daily.       02/06/2022    9:53 AM 01/27/2022    4:22 PM 11/04/2021    8:52 AM 07/03/2021    9:55 AM  GAD 7 : Generalized Anxiety Score  Nervous, Anxious, on Edge 0 2 0 2  Control/stop worrying 0 1 0 2  Worry too much - different things 0 1 0 2  Trouble relaxing 0 1 0 0  Restless 0 1 0 0  Easily annoyed or irritable 0 1 0 0  Afraid - awful might happen 0 1 0 0  Total GAD 7 Score 0 8 0 6  Anxiety Difficulty Not difficult at all Not difficult at all Not difficult at all Somewhat difficult       02/06/2022    9:53 AM  Depression screen PHQ 2/9  Decreased Interest 0  Down, Depressed, Hopeless 0  PHQ - 2 Score 0  Altered sleeping 0  Tired, decreased energy 0  Change in appetite 0  Feeling bad or failure about  yourself  0  Trouble concentrating 0  Moving slowly or fidgety/restless 0  Suicidal thoughts 0  PHQ-9 Score 0  Difficult doing work/chores Not difficult at all    BP Readings from Last 3 Encounters:  02/06/22 118/70  02/01/22 121/81  01/31/22 (!) 153/84    Physical Exam Vitals and nursing note reviewed. Exam conducted with a chaperone present.  Constitutional:      General: She is not in acute distress.    Appearance: She is not diaphoretic.  HENT:  Head: Normocephalic and atraumatic.     Right Ear: Tympanic membrane, ear canal and external ear normal.     Left Ear: Tympanic membrane, ear canal and external ear normal.     Nose: Nose normal. No congestion or rhinorrhea.     Mouth/Throat:     Lips: Pink.     Mouth: Mucous membranes are dry.  Eyes:     General:        Right eye: No discharge.        Left eye: No discharge.     Conjunctiva/sclera: Conjunctivae normal.     Pupils: Pupils are equal, round, and reactive to light.  Neck:     Thyroid: No thyromegaly.     Vascular: No JVD.  Cardiovascular:     Rate and Rhythm: Normal rate and regular rhythm.     Heart sounds: Normal heart sounds, S1 normal and S2 normal. No murmur heard. No systolic murmur is present.  No diastolic murmur is present.    No friction rub. No gallop. No S3 or S4 sounds.  Pulmonary:     Effort: Pulmonary effort is normal.     Breath sounds: Normal breath sounds. No decreased air movement. No decreased breath sounds, wheezing, rhonchi or rales.  Chest:     Chest wall: No tenderness.  Abdominal:     General: Bowel sounds are normal.     Palpations: Abdomen is soft. There is no mass.     Tenderness: There is no abdominal tenderness. There is no right CVA tenderness, left CVA tenderness or guarding.  Musculoskeletal:        General: Normal range of motion.     Cervical back: Normal range of motion and neck supple.  Lymphadenopathy:     Cervical: No cervical adenopathy.  Skin:    General:  Skin is warm and dry.  Neurological:     Mental Status: She is alert.     Deep Tendon Reflexes: Reflexes are normal and symmetric.    Wt Readings from Last 3 Encounters:  02/06/22 132 lb (59.9 kg)  02/01/22 126 lb 8.7 oz (57.4 kg)  01/31/22 138 lb 0.1 oz (62.6 kg)    BP 118/70   Pulse 69   Ht _0  (1.651 m)   Wt 132 lb (59.9 kg)   SpO2 99%   BMI 21.97 kg/m   Assessment and Plan: Patient presents for follow-up for hospital discharge from 3 days previous.  Incidental finding and treatment was for pneumonia possibility which she is just completing antibiotic and respiratory rate is normal and pulse ox is 99%. 1. Dehydration New onset.  Controlled.  Stable.  Exam is noted to be consistent with dehydration.  Patient is currently resumed on her losartan and I have encouraged her to continue to do so since it is a low dose and it is beneficial for her diabetic concern.  Patient will continue to push fluids and we will recheck.  2. Syncope, unspecified syncope type New onset.  Resolved.  Stable.  Blood pressure is in acceptable range at 118/70.  Patient is not having any orthostatic symptomatology and will continue with fluid intake.  3. Pneumonia patient has completed course of Cipro which I presume was for pneumonia and possibly for a urine concern which patient is asymptomatic at this time.  We will recheck with chest x-ray in 2 to 3 weeks upon return for recheck of other medical concerns.

## 2022-02-23 ENCOUNTER — Ambulatory Visit (INDEPENDENT_AMBULATORY_CARE_PROVIDER_SITE_OTHER): Payer: PPO

## 2022-02-23 VITALS — BP 132/78 | Resp 16 | Ht 65.0 in | Wt 128.8 lb

## 2022-02-23 DIAGNOSIS — Z0000 Encounter for general adult medical examination without abnormal findings: Secondary | ICD-10-CM

## 2022-02-23 NOTE — Patient Instructions (Signed)
Jordan Thornton , Thank you for taking time to come for your Medicare Wellness Visit. I appreciate your ongoing commitment to your health goals. Please review the following plan we discussed and let me know if I can assist you in the future.   Screening recommendations/referrals: Colonoscopy: no longer required Mammogram: done 09/03/21 Bone Density: no longer required Recommended yearly ophthalmology/optometry visit for glaucoma screening and checkup Recommended yearly dental visit for hygiene and checkup  Vaccinations: Influenza vaccine: done 04/26/21 Pneumococcal vaccine: done 04/26/21 Tdap vaccine: done 05/23/15 Shingles vaccine: done 06/10/18 & 08/13/18   Covid-19:done 11/10/19, 12/06/19, 06/12/20, 04/26/21 & 08/15/21  Conditions/risks identified: recommend increasing physical activity as tolerated  Next appointment: Follow up in one year for your annual wellness visit    Preventive Care 21 Years and Older, Female Preventive care refers to lifestyle choices and visits with your health care provider that can promote health and wellness. What does preventive care include? A yearly physical exam. This is also called an annual well check. Dental exams once or twice a year. Routine eye exams. Ask your health care provider how often you should have your eyes checked. Personal lifestyle choices, including: Daily care of your teeth and gums. Regular physical activity. Eating a healthy diet. Avoiding tobacco and drug use. Limiting alcohol use. Practicing safe sex. Taking low-dose aspirin every day. Taking vitamin and mineral supplements as recommended by your health care provider. What happens during an annual well check? The services and screenings done by your health care provider during your annual well check will depend on your age, overall health, lifestyle risk factors, and family history of disease. Counseling  Your health care provider may ask you questions about your: Alcohol  use. Tobacco use. Drug use. Emotional well-being. Home and relationship well-being. Sexual activity. Eating habits. History of falls. Memory and ability to understand (cognition). Work and work Statistician. Reproductive health. Screening  You may have the following tests or measurements: Height, weight, and BMI. Blood pressure. Lipid and cholesterol levels. These may be checked every 5 years, or more frequently if you are over 77 years old. Skin check. Lung cancer screening. You may have this screening every year starting at age 9 if you have a 30-pack-year history of smoking and currently smoke or have quit within the past 15 years. Fecal occult blood test (FOBT) of the stool. You may have this test every year starting at age 59. Flexible sigmoidoscopy or colonoscopy. You may have a sigmoidoscopy every 5 years or a colonoscopy every 10 years starting at age 73. Hepatitis C blood test. Hepatitis B blood test. Sexually transmitted disease (STD) testing. Diabetes screening. This is done by checking your blood sugar (glucose) after you have not eaten for a while (fasting). You may have this done every 1-3 years. Bone density scan. This is done to screen for osteoporosis. You may have this done starting at age 60. Mammogram. This may be done every 1-2 years. Talk to your health care provider about how often you should have regular mammograms. Talk with your health care provider about your test results, treatment options, and if necessary, the need for more tests. Vaccines  Your health care provider may recommend certain vaccines, such as: Influenza vaccine. This is recommended every year. Tetanus, diphtheria, and acellular pertussis (Tdap, Td) vaccine. You may need a Td booster every 10 years. Zoster vaccine. You may need this after age 60. Pneumococcal 13-valent conjugate (PCV13) vaccine. One dose is recommended after age 68. Pneumococcal polysaccharide (PPSV23) vaccine. One  dose is  recommended after age 47. Talk to your health care provider about which screenings and vaccines you need and how often you need them. This information is not intended to replace advice given to you by your health care provider. Make sure you discuss any questions you have with your health care provider. Document Released: 09/27/2015 Document Revised: 05/20/2016 Document Reviewed: 07/02/2015 Elsevier Interactive Patient Education  2017 [place] Prevention in the Home Falls can cause injuries. They can happen to people of all ages. There are many things you can do to make your home safe and to help prevent falls. What can I do on the outside of my home? Regularly fix the edges of walkways and driveways and fix any cracks. Remove anything that might make you trip as you walk through a door, such as a raised step or threshold. Trim any bushes or trees on the path to your home. Use bright outdoor lighting. Clear any walking paths of anything that might make someone trip, such as rocks or tools. Regularly check to see if handrails are loose or broken. Make sure that both sides of any steps have handrails. Any raised decks and porches should have guardrails on the edges. Have any leaves, snow, or ice cleared regularly. Use sand or salt on walking paths during winter. Clean up any spills in your garage right away. This includes oil or grease spills. What can I do in the bathroom? Use night lights. Install grab bars by the toilet and in the tub and shower. Do not use towel bars as grab bars. Use non-skid mats or decals in the tub or shower. If you need to sit down in the shower, use a plastic, non-slip stool. Keep the floor dry. Clean up any water that spills on the floor as soon as it happens. Remove soap buildup in the tub or shower regularly. Attach bath mats securely with double-sided non-slip rug tape. Do not have throw rugs and other things on the floor that can make you  trip. What can I do in the bedroom? Use night lights. Make sure that you have a light by your bed that is easy to reach. Do not use any sheets or blankets that are too big for your bed. They should not hang down onto the floor. Have a firm chair that has side arms. You can use this for support while you get dressed. Do not have throw rugs and other things on the floor that can make you trip. What can I do in the kitchen? Clean up any spills right away. Avoid walking on wet floors. Keep items that you use a lot in easy-to-reach places. If you need to reach something above you, use a strong step stool that has a grab bar. Keep electrical cords out of the way. Do not use floor polish or wax that makes floors slippery. If you must use wax, use non-skid floor wax. Do not have throw rugs and other things on the floor that can make you trip. What can I do with my stairs? Do not leave any items on the stairs. Make sure that there are handrails on both sides of the stairs and use them. Fix handrails that are broken or loose. Make sure that handrails are as long as the stairways. Check any carpeting to make sure that it is firmly attached to the stairs. Fix any carpet that is loose or worn. Avoid having throw rugs at the top or bottom of the  stairs. If you do have throw rugs, attach them to the floor with carpet tape. Make sure that you have a light switch at the top of the stairs and the bottom of the stairs. If you do not have them, ask someone to add them for you. What else can I do to help prevent falls? Wear shoes that: Do not have high heels. Have rubber bottoms. Are comfortable and fit you well. Are closed at the toe. Do not wear sandals. If you use a stepladder: Make sure that it is fully opened. Do not climb a closed stepladder. Make sure that both sides of the stepladder are locked into place. Ask someone to hold it for you, if possible. Clearly mark and make sure that you can  see: Any grab bars or handrails. First and last steps. Where the edge of each step is. Use tools that help you move around (mobility aids) if they are needed. These include: Canes. Walkers. Scooters. Crutches. Turn on the lights when you go into a dark area. Replace any light bulbs as soon as they burn out. Set up your furniture so you have a clear path. Avoid moving your furniture around. If any of your floors are uneven, fix them. If there are any pets around you, be aware of where they are. Review your medicines with your doctor. Some medicines can make you feel dizzy. This can increase your chance of falling. Ask your doctor what other things that you can do to help prevent falls. This information is not intended to replace advice given to you by your health care provider. Make sure you discuss any questions you have with your health care provider. Document Released: 06/27/2009 Document Revised: 02/06/2016 Document Reviewed: 10/05/2014 Elsevier Interactive Patient Education  2017 [employer].

## 2022-02-23 NOTE — Progress Notes (Signed)
Subjective:   Jordan Thornton is a 88 y.o. female who presents for Medicare Annual (Subsequent) preventive examination.  Review of Systems     Cardiac Risk Factors include: advanced age (>62men, >39 women);diabetes mellitus;dyslipidemia;hypertension     Objective:    Today's Vitals   02/23/22 0936  BP: 132/78  Resp: 16  Weight: 128 lb 12.8 oz (58.4 kg)  Height: 5\' 5"  (1.651 m)   Body mass index is 21.43 kg/m.     02/23/2022    9:41 AM 01/31/2022   12:17 PM 01/31/2022   10:47 AM 02/17/2021   10:01 AM 02/14/2020   10:02 AM 09/19/2019    3:00 PM 09/19/2019    2:57 PM  Advanced Directives  Does Patient Have a Medical Advance Directive? Yes Yes Unable to assess, patient is non-responsive or altered mental status Yes Yes Yes   Type of Advance Directive [place];Living will Living will  [place];Living will [place];Living will Living will Living will  Does patient want to make changes to medical advance directive?  No - Patient declined    No - Patient declined   Copy of [place] in Chart? Yes - validated most recent copy scanned in chart (See row information)   Yes - validated most recent copy scanned in chart (See row information) Yes - validated most recent copy scanned in chart (See row information)      Current Medications (verified) Outpatient Encounter Medications as of 02/23/2022  Medication Sig   Acetaminophen (TYLENOL ARTHRITIS PAIN PO) Take 2 tablets by mouth daily as needed.    ADVOCATE LANCETS MISC use once daily as directed to test blood sugar   aspirin EC 81 MG tablet Take 1 tablet (81 mg total) by mouth daily. Swallow whole.   Blood Glucose Monitoring Suppl (FREESTYLE LITE) DEVI use once daily as directed to test blood sugar   busPIRone (BUSPAR) 7.5 MG tablet Take 7.5 mg by mouth 2 (two) times daily.   Cholecalciferol (VITAMIN D) 2000 units CAPS Take 1 capsule by mouth daily. otc    Elderberry 575 MG/5ML SYRP Take 1 Dose by mouth daily.    FREESTYLE LITE test strip USE 1 STRIP TO CHECK GLUCOSE ONCE DAILY AS DIRECTED   JANUVIA 25 MG tablet Take 1 tablet (25 mg total) by mouth daily.   Lancet Devices (ADVOCATE LANCING DEVICE) MISC use once daily as directed to test blood sugar   lansoprazole (PREVACID) 15 MG capsule Take 1 capsule by mouth once daily (Patient taking differently: otc)   LORazepam (ATIVAN) 0.5 MG tablet Take 1 tablet (0.5 mg total) by mouth as needed. Per 30 days   losartan (COZAAR) 25 MG tablet Take 25 mg by mouth daily.   Omega-3 Fatty Acids (FISH OIL) 1000 MG CAPS Take 1 capsule (1,000 mg total) by mouth daily.   simvastatin (ZOCOR) 20 MG tablet Take 1 tablet (20 mg total) by mouth daily at 6 PM.   No facility-administered encounter medications on file as of 02/23/2022.    Allergies (verified) Sertraline   History: Past Medical History:  Diagnosis Date   Diabetes mellitus without complication (HCC)    GERD (gastroesophageal reflux disease)    Hyperlipidemia    Hypertension    Past Surgical History:  Procedure Laterality Date   bladder tack     BREAST BIOPSY Left 10-15 yrs ago   benign   CATARACT EXTRACTION Bilateral 05/2015   CHOLECYSTECTOMY OPEN  VAGINAL HYSTERECTOMY     Family History  Problem Relation Age of Onset   Heart disease Maternal Aunt    Heart disease Maternal Grandmother    Stroke Father    Breast cancer Neg Hx    Social History   Socioeconomic History   Marital status: Married    Spouse name: Not on file   Number of children: 2   Years of education: Not on file   Highest education level: Bachelor's degree (e.g., BA, AB, BS)  Occupational History    Employer: RETIRED  Tobacco Use   Smoking status: Former    Packs/day: 0.25    Years: 25.00    Total pack years: 6.25    Types: Cigarettes    Quit date: 1982    Years since quitting: 41.4   Smokeless tobacco: Never   Tobacco comments:    smoking cessation  materials not required  Vaping Use   Vaping Use: Never used  Substance and Sexual Activity   Alcohol use: No    Alcohol/week: 0.0 standard drinks of alcohol   Drug use: No   Sexual activity: Not Currently  Other Topics Concern   Not on file  Social History Narrative   Not on file   Social Determinants of Health   Financial Resource Strain: Low Risk  (02/23/2022)   Overall Financial Resource Strain (CARDIA)    Difficulty of Paying Living Expenses: Not hard at all  Food Insecurity: No Food Insecurity (02/23/2022)   Hunger Vital Sign    Worried About Running Out of Food in the Last Year: Never true    Ran Out of Food in the Last Year: Never true  Transportation Needs: No Transportation Needs (02/23/2022)   PRAPARE - Hydrologist (Medical): No    Lack of Transportation (Non-Medical): No  Physical Activity: Inactive (02/23/2022)   Exercise Vital Sign    Days of Exercise per Week: 0 days    Minutes of Exercise per Session: 0 min  Stress: No Stress Concern Present (02/23/2022)   [place]    Feeling of Stress : Only a little  Social Connections: Moderately Integrated (02/23/2022)   Social Connection and Isolation Panel [NHANES]    Frequency of Communication with Friends and Family: More than three times a week    Frequency of Social Gatherings with Friends and Family: Three times a week    Attends Religious Services: More than 4 times per year    Active Member of Clubs or Organizations: No    Attends Archivist Meetings: Never    Marital Status: Married    Tobacco Counseling Counseling given: Not Answered Tobacco comments: smoking cessation materials not required   Clinical Intake:  Pre-visit preparation completed: Yes  Pain : No/denies pain     BMI - recorded: 21.43 Nutritional Status: BMI of 19-24  Normal Nutritional Risks: None Diabetes: Yes CBG done?: No Did pt.  bring in CBG monitor from home?: No  How often do you need to have someone help you when you read instructions, pamphlets, or other written materials from your doctor or pharmacy?: 1 - Never  Nutrition Risk Assessment:  Has the patient had any N/V/D within the last 2 months?  No  Does the patient have any non-healing wounds?  No  Has the patient had any unintentional weight loss or weight gain?  No   Diabetes:  Is the patient diabetic?  Yes  If diabetic,  was a CBG obtained today?  No  Did the patient bring in their glucometer from home?  No  How often do you monitor your CBG's? daily.   Financial Strains and Diabetes Management:  Are you having any financial strains with the device, your supplies or your medication? No .  Does the patient want to be seen by Chronic Care Management for management of their diabetes?  No  Would the patient like to be referred to a Nutritionist or for Diabetic Management?  No   Diabetic Exams:  Diabetic Eye Exam: Completed 10/27/21.   Diabetic Foot Exam: Completed 02/06/22.    Interpreter Needed?: No  Information entered by :: Clemetine Marker LPN   Activities of Daily Living    02/23/2022    9:42 AM  In your present state of health, do you have any difficulty performing the following activities:  Hearing? 0  Vision? 0  Difficulty concentrating or making decisions? 0  Walking or climbing stairs? 0  Dressing or bathing? 0  Doing errands, shopping? 0  Preparing Food and eating ? N  Using the Toilet? N  In the past six months, have you accidently leaked urine? N  Do you have problems with loss of bowel control? N  Managing your Medications? N  Managing your Finances? N  Housekeeping or managing your Housekeeping? N    Patient Care Team: Juline Patch, MD as PCP - General (Family Medicine) Anthonette Legato, MD as Consulting Physician (Internal Medicine)  Indicate any recent Medical Services you may have received from other than [facility]  providers in the past year (date may be approximate).     Assessment:   This is a routine wellness examination for Jordan.  Hearing/Vision screen Hearing Screening - Comments::  Pt denies hearing difficulty Vision Screening - Comments:: [facility] screen[facility] at [facility]  Dietary issues and exercise activities discussed: Current Exercise Habits: The patient does not participate in regular exercise at present, Exercise limited by: None identified   Goals Addressed             This Visit's Progress    DIET - INCREASE WATER INTAKE   On track    Recommend to drink at least 6-8 8oz glasses of water per day.       Depression Screen    02/23/2022    9:40 AM 02/06/2022    9:53 AM 01/27/2022    4:21 PM 11/04/2021    8:52 AM 07/03/2021    9:53 AM 02/27/2021   10:29 AM 02/17/2021    9:59 AM  PHQ 2/9 Scores  PHQ - 2 Score 0 0 0 0 2 1 0  PHQ- 9 Score 2 0 0 0 2 1     Fall Risk    02/23/2022    9:42 AM 02/06/2022    9:52 AM 01/27/2022    4:22 PM 02/17/2021   10:02 AM 06/25/2020   10:25 AM  Fall Risk   Falls in the past year? 1 1 0 0 0  Number falls in past yr: 0 1  0   Injury with Fall? 0 0 0 0   Risk for fall due to : No Fall Risks No Fall Risks  No Fall Risks   Follow up Falls prevention discussed Falls evaluation completed  Falls prevention discussed Falls evaluation completed    [place]:  Any stairs in or around the home? Yes  If so, are there any without handrails? No  Home free of loose throw rugs in walkways, pet beds, electrical cords, etc? Yes  Adequate lighting in your home to reduce risk of falls? Yes   ASSISTIVE DEVICES UTILIZED TO PREVENT FALLS:  Life alert? No  Use of a cane, walker or w/c? No  Grab bars in the bathroom? No  Shower chair or bench in shower? No  Elevated toilet seat or a handicapped toilet? No   TIMED UP AND GO:  Was the test performed? Yes .  Length of time to ambulate 10 feet: 5 sec.    Gait steady and fast without use of assistive device  Cognitive Function: Normal cognitive status assessed by direct observation by this Nurse Health Advisor. No abnormalities found.          02/14/2020   10:04 AM 02/13/2019    9:55 AM 02/09/2018   10:09 AM  6CIT Screen  What Year? 0 points 0 points 0 points  What month? 0 points 0 points 0 points  What time? 0 points 0 points 0 points  Count back from 20 0 points 0 points 0 points  Months in reverse 0 points 0 points 0 points  Repeat phrase 2 points 2 points 2 points  Total Score 2 points 2 points 2 points    Immunizations Immunization History  Administered Date(s) Administered   Fluad Quad(high Dose 65+) 05/04/2019   Influenza, High Dose Seasonal PF 06/19/2017, 05/23/2018   Influenza-Unspecified 05/23/2018, 05/29/2020, 04/26/2021   PFIZER(Purple Top)SARS-COV-2 Vaccination 11/10/2019, 12/06/2019, 06/12/2020, 04/26/2021, 08/15/2021   Pneumococcal Conjugate-13 05/23/2015   Pneumococcal Polysaccharide-23 09/14/2008, 04/26/2021   Td 03/14/2006   Tdap 05/23/2015   Zoster Recombinat (Shingrix) 06/10/2018, 08/13/2018    TDAP status: Up to date  Flu Vaccine status: Up to date  Pneumococcal vaccine status: Up to date  Covid-19 vaccine status: Completed vaccines  Qualifies for Shingles Vaccine? Yes   Zostavax completed No   Shingrix Completed?: Yes  Screening Tests Health Maintenance  Topic Date Due   COVID-19 Vaccine (6 - Pfizer series) 10/10/2021   INFLUENZA VACCINE  04/14/2022   HEMOGLOBIN A1C  08/03/2022   MAMMOGRAM  09/03/2022   OPHTHALMOLOGY EXAM  10/27/2022   FOOT EXAM  02/07/2023   TETANUS/TDAP  05/22/2025   Pneumonia Vaccine 37+ Years old  Completed   DEXA SCAN  Completed   Zoster Vaccines- Shingrix  Completed   HPV VACCINES  Aged Out    Health Maintenance  Health Maintenance Due  Topic Date Due   COVID-19 Vaccine (6 - Pfizer series) 10/10/2021    Colorectal cancer screening: No longer required.    Mammogram status: Completed 09/03/21. Repeat every year  Bone density status: no longer required due to age  Lung Cancer Screening: (Low Dose CT Chest recommended if Age 80-80 years, 30 pack-year currently smoking OR have quit w/in 15years.) does not qualify.   Additional Screening:  Hepatitis C Screening: does not qualify   Vision Screening: Recommended annual ophthalmology exams for early detection of glaucoma and other disorders of the eye. Is the patient up to date with their annual eye exam?  Yes  Who is the provider or what is the name of the office in which the patient attends annual eye exams? [facility].   Dental Screening: Recommended annual dental exams for proper oral hygiene  Community Resource Referral / Chronic Care Management: CRR required this visit?  No   CCM required this visit?  No      Plan:     I have  personally reviewed and noted the following in the patient's chart:   Medical and social history Use of alcohol, tobacco or illicit drugs  Current medications and supplements including opioid prescriptions.  Functional ability and status Nutritional status Physical activity Advanced directives List of other physicians Hospitalizations, surgeries, and ER visits in previous 12 months Vitals Screenings to include cognitive, depression, and falls Referrals and appointments  In addition, I have reviewed and discussed with patient certain preventive protocols, quality metrics, and best practice recommendations. A written personalized care plan for preventive services as well as general preventive health recommendations were provided to patient.     Clemetine Marker, LPN   [id number]   Nurse Notes: none

## 2022-02-25 DIAGNOSIS — N184 Chronic kidney disease, stage 4 (severe): Secondary | ICD-10-CM | POA: Diagnosis not present

## 2022-02-25 DIAGNOSIS — D631 Anemia in chronic kidney disease: Secondary | ICD-10-CM | POA: Diagnosis not present

## 2022-02-25 DIAGNOSIS — E1122 Type 2 diabetes mellitus with diabetic chronic kidney disease: Secondary | ICD-10-CM | POA: Diagnosis not present

## 2022-02-25 DIAGNOSIS — N2581 Secondary hyperparathyroidism of renal origin: Secondary | ICD-10-CM | POA: Diagnosis not present

## 2022-02-25 DIAGNOSIS — I10 Essential (primary) hypertension: Secondary | ICD-10-CM | POA: Diagnosis not present

## 2022-03-02 ENCOUNTER — Ambulatory Visit (INDEPENDENT_AMBULATORY_CARE_PROVIDER_SITE_OTHER): Payer: PPO | Admitting: Family Medicine

## 2022-03-02 ENCOUNTER — Encounter: Payer: Self-pay | Admitting: Family Medicine

## 2022-03-02 DIAGNOSIS — E119 Type 2 diabetes mellitus without complications: Secondary | ICD-10-CM | POA: Diagnosis not present

## 2022-03-02 DIAGNOSIS — E782 Mixed hyperlipidemia: Secondary | ICD-10-CM | POA: Diagnosis not present

## 2022-03-02 MED ORDER — SIMVASTATIN 20 MG PO TABS: 20.0000 mg | ORAL_TABLET | Freq: Every day | ORAL | 1 refills | Status: DC

## 2022-03-02 MED ORDER — JANUVIA 25 MG PO TABS: 25.0000 mg | ORAL_TABLET | Freq: Every day | ORAL | 1 refills | Status: DC

## 2022-03-02 NOTE — Progress Notes (Signed)
Date:  03/02/2022   Name:  Jordan Thornton   DOB:  September 23, 1929   MRN:  [id number]   Chief Complaint: Diabetes and Hyperlipidemia  Diabetes She presents for her follow-up diabetic visit. She has type 2 diabetes mellitus. Her disease course has been stable. There are no hypoglycemic associated symptoms. Pertinent negatives for hypoglycemia include no dizziness, headaches or nervousness/anxiousness. There are no diabetic associated symptoms. Pertinent negatives for diabetes include no chest pain and no polydipsia. There are no hypoglycemic complications. Symptoms are stable. There are no diabetic complications. There are no known risk factors for coronary artery disease. Current diabetic treatment includes oral agent (monotherapy). She is compliant with treatment all of the time. Her weight is stable. She is following a generally healthy diet. Meal planning includes avoidance of concentrated sweets and carbohydrate counting. She participates in exercise intermittently. Her home blood glucose trend is fluctuating minimally. Her breakfast blood glucose is taken between 8-9 am.  Hyperlipidemia This is a chronic problem. The current episode started more than 1 year ago. The problem is controlled. Recent lipid tests were reviewed and are normal. Exacerbating diseases include diabetes. She has no history of chronic renal disease. Pertinent negatives include no chest pain, focal sensory loss, focal weakness, myalgias or shortness of breath. Current antihyperlipidemic treatment includes statins. The current treatment provides moderate improvement of lipids. There are no compliance problems.     Lab Results  Component Value Date   NA 142 02/01/2022   K 3.7 02/01/2022   CO2 24 02/01/2022   GLUCOSE 97 02/01/2022   BUN 26 (H) 02/01/2022   CREATININE 2.11 (H) 02/01/2022   CALCIUM 9.1 02/01/2022   EGFR 27 (L) 07/03/2021   GFRNONAA 22 (L) 02/01/2022   Lab Results  Component Value Date   CHOL 167  02/01/2022   HDL 59 02/01/2022   LDLCALC 96 02/01/2022   TRIG 61 02/01/2022   CHOLHDL 2.8 02/01/2022   No results found for: "TSH" Lab Results  Component Value Date   HGBA1C 6.5 (H) 01/31/2022   Lab Results  Component Value Date   WBC 6.5 01/31/2022   HGB 11.8 (L) 01/31/2022   HCT 36.6 01/31/2022   MCV 79.4 (L) 01/31/2022   PLT 178 01/31/2022   Lab Results  Component Value Date   ALT 15 01/31/2022   AST 19 01/31/2022   ALKPHOS 53 01/31/2022   BILITOT 1.2 01/31/2022   No results found for: "25OHVITD2", "25OHVITD3", "VD25OH"   Review of Systems  Constitutional:  Negative for chills and fever.  HENT:  Negative for drooling, ear discharge, ear pain and sore throat.   Respiratory:  Negative for cough, shortness of breath and wheezing.   Cardiovascular:  Negative for chest pain, palpitations and leg swelling.  Gastrointestinal:  Negative for abdominal pain, blood in stool, constipation, diarrhea and nausea.  Endocrine: Negative for polydipsia.  Genitourinary:  Negative for dysuria, frequency, hematuria and urgency.  Musculoskeletal:  Negative for back pain, myalgias and neck pain.  Skin:  Negative for rash.  Allergic/Immunologic: Negative for environmental allergies.  Neurological:  Negative for dizziness, focal weakness and headaches.  Hematological:  Does not bruise/bleed easily.  Psychiatric/Behavioral:  Negative for suicidal ideas. The patient is not nervous/anxious.     Patient Active Problem List   Diagnosis Date Noted   Abnormal chest x-ray    Syncope 01/31/2022   Type II diabetes mellitus with renal manifestations ([place]) 01/31/2022   Depression with anxiety 01/31/2022   CKD (chronic kidney disease),  stage IV (HCC) 01/31/2022   Chronic diastolic CHF (congestive heart failure) (HCC) 01/31/2022   Urinary tract infection, acute 01/31/2022   Elevated troponin 01/31/2022   H/O total hysterectomy with bilateral salpingo-oophorectomy (BSO) 10/29/2020   Anxiety  10/29/2020   Acute hypoxemic respiratory failure due to COVID-19 (HCC) 09/19/2019   Stage 3 chronic kidney disease (HCC) 09/02/2019   Diabetes mellitus with no complication (HCC) 01/03/2015   Familial multiple lipoprotein-type hyperlipidemia 01/03/2015   Creatinine elevation 01/03/2015   Diabetes (HCC) 12/15/2014   Hyperlipidemia 12/15/2014   Hypertension 12/15/2014   Gastro-esophageal reflux disease without esophagitis 12/15/2014    Allergies  Allergen Reactions   Sertraline     Made her more depressed    Past Surgical History:  Procedure Laterality Date   bladder tack     BREAST BIOPSY Left 10-15 yrs ago   benign   CATARACT EXTRACTION Bilateral 05/2015   CHOLECYSTECTOMY OPEN     VAGINAL HYSTERECTOMY      Social History   Tobacco Use   Smoking status: Former    Packs/day: 0.25    Years: 25.00    Total pack years: 6.25    Types: Cigarettes    Quit date: 1982    Years since quitting: 41.4   Smokeless tobacco: Never   Tobacco comments:    smoking cessation materials not required  Vaping Use   Vaping Use: Never used  Substance Use Topics   Alcohol use: No    Alcohol/week: 0.0 standard drinks of alcohol   Drug use: No     Medication list has been reviewed and updated.  Current Meds  Medication Sig   Acetaminophen (TYLENOL ARTHRITIS PAIN PO) Take 2 tablets by mouth daily as needed.    ADVOCATE LANCETS MISC use once daily as directed to test blood sugar   aspirin EC 81 MG tablet Take 1 tablet (81 mg total) by mouth daily. Swallow whole.   Blood Glucose Monitoring Suppl (FREESTYLE LITE) DEVI use once daily as directed to test blood sugar   Cholecalciferol (VITAMIN D) 2000 units CAPS Take 1 capsule by mouth daily. otc   Elderberry 575 MG/5ML SYRP Take 1 Dose by mouth daily.    FREESTYLE LITE test strip USE 1 STRIP TO CHECK GLUCOSE ONCE DAILY AS DIRECTED   JANUVIA 25 MG tablet Take 1 tablet (25 mg total) by mouth daily.   Lancet Devices (ADVOCATE LANCING DEVICE)  MISC use once daily as directed to test blood sugar   lansoprazole (PREVACID) 15 MG capsule Take 1 capsule by mouth once daily (Patient taking differently: otc)   LORazepam (ATIVAN) 0.5 MG tablet Take 1 tablet (0.5 mg total) by mouth as needed. Per 30 days   losartan (COZAAR) 25 MG tablet Take 25 mg by mouth daily.   Omega-3 Fatty Acids (FISH OIL) 1000 MG CAPS Take 1 capsule (1,000 mg total) by mouth daily.   simvastatin (ZOCOR) 20 MG tablet Take 1 tablet (20 mg total) by mouth daily at 6 PM.       03/02/2022    9:28 AM 02/06/2022    9:53 AM 01/27/2022    4:22 PM 11/04/2021    8:52 AM  GAD 7 : Generalized Anxiety Score  Nervous, Anxious, on Edge 0 0 2 0  Control/stop worrying 1 0 1 0  Worry too much - different things 1 0 1 0  Trouble relaxing 0 0 1 0  Restless 0 0 1 0  Easily annoyed or irritable 0 0 1 0    Afraid - awful might happen 0 0 1 0  Total GAD 7 Score 2 0 8 0  Anxiety Difficulty Not difficult at all Not difficult at all Not difficult at all Not difficult at all       03/02/2022    9:28 AM  Depression screen PHQ 2/9  Decreased Interest 0  Down, Depressed, Hopeless 1  PHQ - 2 Score 1  Altered sleeping 0  Tired, decreased energy 0  Change in appetite 0  Feeling bad or failure about yourself  0  Trouble concentrating 0  Moving slowly or fidgety/restless 0  Suicidal thoughts 0  PHQ-9 Score 1  Difficult doing work/chores Not difficult at all    BP Readings from Last 3 Encounters:  03/02/22 130/80  02/23/22 132/78  02/06/22 118/70    Physical Exam Vitals and nursing note reviewed. Exam conducted with a chaperone present.  Constitutional:      General: She is not in acute distress.    Appearance: She is not diaphoretic.  HENT:     Head: Normocephalic and atraumatic.     Right Ear: Tympanic membrane and external ear normal.     Left Ear: Tympanic membrane and external ear normal.     Nose: Nose normal.  Eyes:     General:        Right eye: No discharge.         Left eye: No discharge.     Conjunctiva/sclera: Conjunctivae normal.     Pupils: Pupils are equal, round, and reactive to light.  Neck:     Thyroid: No thyromegaly.     Vascular: No JVD.  Cardiovascular:     Rate and Rhythm: Normal rate and regular rhythm.     Heart sounds: Normal heart sounds, S1 normal and S2 normal. No murmur heard.    No systolic murmur is present.     No diastolic murmur is present.     No friction rub. No gallop. No S3 or S4 sounds.  Pulmonary:     Effort: Pulmonary effort is normal.     Breath sounds: Normal breath sounds. No decreased breath sounds, wheezing, rhonchi or rales.  Abdominal:     General: Bowel sounds are normal.     Palpations: Abdomen is soft. There is no mass.     Tenderness: There is no abdominal tenderness. There is no guarding.  Musculoskeletal:        General: Normal range of motion.     Cervical back: Normal range of motion and neck supple.     Right lower leg: No edema.     Left lower leg: No edema.  Lymphadenopathy:     Cervical: No cervical adenopathy.  Skin:    General: Skin is warm and dry.  Neurological:     Mental Status: She is alert.     Deep Tendon Reflexes: Reflexes are normal and symmetric.     Wt Readings from Last 3 Encounters:  03/02/22 128 lb (58.1 kg)  02/23/22 128 lb 12.8 oz (58.4 kg)  02/06/22 132 lb (59.9 kg)    BP 130/80   Pulse 72   Ht 5' 5" (1.651 m)   Wt 128 lb (58.1 kg)   BMI 21.30 kg/m   Assessment and Plan:  1. Type 2 diabetes mellitus without complication, without long-term current use of insulin (HCC) Chronic.  Controlled.  Stable.  Continue Januvia 25 mg once a day.  Check microalbuminuria.  A1c is acceptable - JANUVIA 25 MG tablet;  Take 1 tablet (25 mg total) by mouth daily.  Dispense: 90 tablet; Refill: 1 - Microalbumin, urine  2. Mixed hyperlipidemia Chronic.  Controlled.  Stable.  Continue simvastatin 20 mg once a day. - simvastatin (ZOCOR) 20 MG tablet; Take 1 tablet (20 mg total)  by mouth daily at 6 PM.  Dispense: 90 tablet; Refill: 1

## 2022-03-03 LAB — MICROALBUMIN, URINE: Microalbumin, Urine: 39.2 ug/mL

## 2022-03-11 DIAGNOSIS — N184 Chronic kidney disease, stage 4 (severe): Secondary | ICD-10-CM | POA: Diagnosis not present

## 2022-04-03 ENCOUNTER — Emergency Department: Payer: PPO

## 2022-04-03 ENCOUNTER — Other Ambulatory Visit: Payer: Self-pay

## 2022-04-03 ENCOUNTER — Inpatient Hospital Stay
Admission: EM | Admit: 2022-04-03 | Discharge: 2022-04-06 | DRG: 280 | Disposition: A | Discharge status: Home Health | Payer: PPO | Attending: Internal Medicine | Admitting: Internal Medicine

## 2022-04-03 ENCOUNTER — Encounter: Payer: Self-pay | Admitting: Emergency Medicine

## 2022-04-03 DIAGNOSIS — I130 Hypertensive heart and chronic kidney disease with heart failure and stage 1 through stage 4 chronic kidney disease, or unspecified chronic kidney disease: Secondary | ICD-10-CM | POA: Diagnosis present

## 2022-04-03 DIAGNOSIS — R778 Other specified abnormalities of plasma proteins: Secondary | ICD-10-CM

## 2022-04-03 DIAGNOSIS — Z823 Family history of stroke: Secondary | ICD-10-CM

## 2022-04-03 DIAGNOSIS — R7989 Other specified abnormal findings of blood chemistry: Secondary | ICD-10-CM | POA: Diagnosis not present

## 2022-04-03 DIAGNOSIS — N184 Chronic kidney disease, stage 4 (severe): Secondary | ICD-10-CM | POA: Diagnosis present

## 2022-04-03 DIAGNOSIS — Z79899 Other long term (current) drug therapy: Secondary | ICD-10-CM | POA: Diagnosis not present

## 2022-04-03 DIAGNOSIS — E1165 Type 2 diabetes mellitus with hyperglycemia: Secondary | ICD-10-CM | POA: Diagnosis present

## 2022-04-03 DIAGNOSIS — I5041 Acute combined systolic (congestive) and diastolic (congestive) heart failure: Secondary | ICD-10-CM

## 2022-04-03 DIAGNOSIS — Z85038 Personal history of other malignant neoplasm of large intestine: Secondary | ICD-10-CM

## 2022-04-03 DIAGNOSIS — R0600 Dyspnea, unspecified: Principal | ICD-10-CM

## 2022-04-03 DIAGNOSIS — D509 Iron deficiency anemia, unspecified: Secondary | ICD-10-CM | POA: Diagnosis present

## 2022-04-03 DIAGNOSIS — I428 Other cardiomyopathies: Secondary | ICD-10-CM | POA: Diagnosis present

## 2022-04-03 DIAGNOSIS — I493 Ventricular premature depolarization: Secondary | ICD-10-CM | POA: Diagnosis not present

## 2022-04-03 DIAGNOSIS — E1122 Type 2 diabetes mellitus with diabetic chronic kidney disease: Secondary | ICD-10-CM | POA: Diagnosis present

## 2022-04-03 DIAGNOSIS — I2510 Atherosclerotic heart disease of native coronary artery without angina pectoris: Secondary | ICD-10-CM | POA: Diagnosis present

## 2022-04-03 DIAGNOSIS — R0602 Shortness of breath: Secondary | ICD-10-CM | POA: Diagnosis not present

## 2022-04-03 DIAGNOSIS — I5023 Acute on chronic systolic (congestive) heart failure: Secondary | ICD-10-CM | POA: Diagnosis present

## 2022-04-03 DIAGNOSIS — I509 Heart failure, unspecified: Secondary | ICD-10-CM

## 2022-04-03 DIAGNOSIS — Z923 Personal history of irradiation: Secondary | ICD-10-CM

## 2022-04-03 DIAGNOSIS — Z87891 Personal history of nicotine dependence: Secondary | ICD-10-CM | POA: Diagnosis not present

## 2022-04-03 DIAGNOSIS — N179 Acute kidney failure, unspecified: Secondary | ICD-10-CM | POA: Diagnosis not present

## 2022-04-03 DIAGNOSIS — Z8249 Family history of ischemic heart disease and other diseases of the circulatory system: Secondary | ICD-10-CM | POA: Diagnosis not present

## 2022-04-03 DIAGNOSIS — Z933 Colostomy status: Secondary | ICD-10-CM

## 2022-04-03 DIAGNOSIS — I959 Hypotension, unspecified: Secondary | ICD-10-CM | POA: Diagnosis present

## 2022-04-03 DIAGNOSIS — F419 Anxiety disorder, unspecified: Secondary | ICD-10-CM | POA: Diagnosis present

## 2022-04-03 DIAGNOSIS — Z955 Presence of coronary angioplasty implant and graft: Secondary | ICD-10-CM | POA: Diagnosis not present

## 2022-04-03 DIAGNOSIS — I214 Non-ST elevation (NSTEMI) myocardial infarction: Principal | ICD-10-CM | POA: Diagnosis present

## 2022-04-03 DIAGNOSIS — Z7982 Long term (current) use of aspirin: Secondary | ICD-10-CM

## 2022-04-03 DIAGNOSIS — E785 Hyperlipidemia, unspecified: Secondary | ICD-10-CM | POA: Diagnosis present

## 2022-04-03 DIAGNOSIS — F32A Depression, unspecified: Secondary | ICD-10-CM | POA: Diagnosis not present

## 2022-04-03 DIAGNOSIS — R0689 Other abnormalities of breathing: Secondary | ICD-10-CM | POA: Diagnosis not present

## 2022-04-03 DIAGNOSIS — E1169 Type 2 diabetes mellitus with other specified complication: Secondary | ICD-10-CM | POA: Diagnosis not present

## 2022-04-03 DIAGNOSIS — R9431 Abnormal electrocardiogram [ECG] [EKG]: Secondary | ICD-10-CM | POA: Diagnosis not present

## 2022-04-03 DIAGNOSIS — N189 Chronic kidney disease, unspecified: Secondary | ICD-10-CM | POA: Diagnosis not present

## 2022-04-03 DIAGNOSIS — Z9221 Personal history of antineoplastic chemotherapy: Secondary | ICD-10-CM

## 2022-04-03 DIAGNOSIS — K219 Gastro-esophageal reflux disease without esophagitis: Secondary | ICD-10-CM | POA: Diagnosis present

## 2022-04-03 LAB — CBC
HCT: 37 % (ref 36.0–46.0)
Hemoglobin: 12 g/dL (ref 12.0–15.0)
MCH: 25.5 pg — ABNORMAL LOW (ref 26.0–34.0)
MCHC: 32.4 g/dL (ref 30.0–36.0)
MCV: 78.7 fL — ABNORMAL LOW (ref 80.0–100.0)
Platelets: 184 10*3/uL (ref 150–400)
RBC: 4.7 MIL/uL (ref 3.87–5.11)
RDW: 15.2 % (ref 11.5–15.5)
WBC: 10.1 10*3/uL (ref 4.0–10.5)
nRBC: 0 % (ref 0.0–0.2)

## 2022-04-03 LAB — CBC WITH DIFFERENTIAL/PLATELET
Abs Immature Granulocytes: 0.03 10*3/uL (ref 0.00–0.07)
Basophils Absolute: 0 10*3/uL (ref 0.0–0.1)
Basophils Relative: 0 %
Eosinophils Absolute: 0.1 10*3/uL (ref 0.0–0.5)
Eosinophils Relative: 1 %
HCT: 36.6 % (ref 36.0–46.0)
Hemoglobin: 12.1 g/dL (ref 12.0–15.0)
Immature Granulocytes: 0 %
Lymphocytes Relative: 17 %
Lymphs Abs: 1.7 10*3/uL (ref 0.7–4.0)
MCH: 26.1 pg (ref 26.0–34.0)
MCHC: 33.1 g/dL (ref 30.0–36.0)
MCV: 79 fL — ABNORMAL LOW (ref 80.0–100.0)
Monocytes Absolute: 0.6 10*3/uL (ref 0.1–1.0)
Monocytes Relative: 6 %
Neutro Abs: 7.8 10*3/uL — ABNORMAL HIGH (ref 1.7–7.7)
Neutrophils Relative %: 76 %
Platelets: 170 10*3/uL (ref 150–400)
RBC: 4.63 MIL/uL (ref 3.87–5.11)
RDW: 15.4 % (ref 11.5–15.5)
WBC: 10.1 10*3/uL (ref 4.0–10.5)
nRBC: 0 % (ref 0.0–0.2)

## 2022-04-03 LAB — BASIC METABOLIC PANEL
Anion gap: 6 (ref 5–15)
BUN: 32 mg/dL — ABNORMAL HIGH (ref 8–23)
CO2: 22 mmol/L (ref 22–32)
Calcium: 9.6 mg/dL (ref 8.9–10.3)
Chloride: 114 mmol/L — ABNORMAL HIGH (ref 98–111)
Creatinine, Ser: 2.1 mg/dL — ABNORMAL HIGH (ref 0.44–1.00)
GFR, Estimated: 22 mL/min — ABNORMAL LOW (ref >60)
Glucose, Bld: 159 mg/dL — ABNORMAL HIGH (ref 70–99)
Potassium: 4.4 mmol/L (ref 3.5–5.1)
Sodium: 142 mmol/L (ref 135–145)

## 2022-04-03 LAB — CBG MONITORING, ED
Glucose-Capillary: 145 mg/dL — ABNORMAL HIGH (ref 70–99)
Glucose-Capillary: 115 mg/dL — ABNORMAL HIGH (ref 70–99)
Glucose-Capillary: 152 mg/dL — ABNORMAL HIGH (ref 70–99)
Glucose-Capillary: 138 mg/dL — ABNORMAL HIGH (ref 70–99)

## 2022-04-03 LAB — APTT: aPTT: 31 seconds (ref 24–36)

## 2022-04-03 LAB — TROPONIN I (HIGH SENSITIVITY)
Troponin I (High Sensitivity): 126 ng/L (ref <18)
Troponin I (High Sensitivity): 179 ng/L (ref <18)
Troponin I (High Sensitivity): 585 ng/L (ref <18)
Troponin I (High Sensitivity): 676 ng/L (ref <18)
Troponin I (High Sensitivity): 1297 ng/L (ref <18)
Troponin I (High Sensitivity): 1597 ng/L (ref <18)

## 2022-04-03 LAB — PROTIME-INR
INR: 1.1 (ref 0.8–1.2)
Prothrombin Time: 14.4 seconds (ref 11.4–15.2)

## 2022-04-03 LAB — BRAIN NATRIURETIC PEPTIDE: B Natriuretic Peptide: 2133.6 pg/mL — ABNORMAL HIGH (ref 0.0–100.0)

## 2022-04-03 MED ORDER — POLYETHYLENE GLYCOL 3350 17 G PO PACK: 17.0000 g | PACK | Freq: Every day | ORAL | Status: DC | PRN

## 2022-04-03 MED ORDER — ACETAMINOPHEN 325 MG PO TABS: 650.0000 mg | ORAL_TABLET | Freq: Four times a day (QID) | ORAL | Status: DC | PRN

## 2022-04-03 MED ORDER — HEPARIN (PORCINE) 25000 UT/250ML-% IV SOLN
700.0000 [IU]/h | INTRAVENOUS | Status: DC
  Administered 2022-04-03: 700 [IU]/h via INTRAVENOUS
  Filled 2022-04-03: qty 250

## 2022-04-03 MED ORDER — ISOSORBIDE DINITRATE 10 MG PO TABS
10.0000 mg | ORAL_TABLET | Freq: Three times a day (TID) | ORAL | Status: DC
  Administered 2022-04-03 (×2): 10 mg via ORAL
  Filled 2022-04-03 (×3): qty 1

## 2022-04-03 MED ORDER — FUROSEMIDE 10 MG/ML IJ SOLN
40.0000 mg | Freq: Once | INTRAMUSCULAR | Status: AC
Start: 2022-04-03 — End: 2022-04-03
  Administered 2022-04-03: 40 mg via INTRAVENOUS
  Filled 2022-04-03: qty 4

## 2022-04-03 MED ORDER — ASPIRIN 81 MG PO TBEC
81.0000 mg | DELAYED_RELEASE_TABLET | Freq: Every day | ORAL | Status: DC
  Administered 2022-04-04 – 2022-04-06 (×3): 81 mg via ORAL
  Filled 2022-04-03 (×3): qty 1

## 2022-04-03 MED ORDER — PROCHLORPERAZINE EDISYLATE 10 MG/2ML IJ SOLN
10.0000 mg | Freq: Four times a day (QID) | INTRAMUSCULAR | Status: DC | PRN
Start: 2022-04-03 — End: 2022-04-06

## 2022-04-03 MED ORDER — FUROSEMIDE 10 MG/ML IJ SOLN
20.0000 mg | Freq: Two times a day (BID) | INTRAMUSCULAR | Status: DC
  Administered 2022-04-03 – 2022-04-04 (×2): 20 mg via INTRAVENOUS
  Filled 2022-04-03 (×2): qty 4

## 2022-04-03 MED ORDER — HEPARIN SODIUM (PORCINE) 5000 UNIT/ML IJ SOLN
60.0000 [IU]/kg | Freq: Once | INTRAMUSCULAR | Status: DC
  Filled 2022-04-03: qty 1

## 2022-04-03 MED ORDER — MELATONIN 5 MG PO TABS
5.0000 mg | ORAL_TABLET | Freq: Every evening | ORAL | Status: DC | PRN
  Administered 2022-04-04: 5 mg via ORAL
  Filled 2022-04-03: qty 1

## 2022-04-03 MED ORDER — HYDRALAZINE HCL 10 MG PO TABS
10.0000 mg | ORAL_TABLET | Freq: Three times a day (TID) | ORAL | Status: DC
  Administered 2022-04-03 (×2): 10 mg via ORAL
  Filled 2022-04-03 (×3): qty 1

## 2022-04-03 MED ORDER — INSULIN ASPART 100 UNIT/ML IJ SOLN
0.0000 [IU] | INTRAMUSCULAR | Status: DC
  Administered 2022-04-03: 1 [IU] via SUBCUTANEOUS
  Administered 2022-04-03: 2 [IU] via SUBCUTANEOUS
  Administered 2022-04-04: 1 [IU] via SUBCUTANEOUS
  Filled 2022-04-03 (×3): qty 1

## 2022-04-03 MED ORDER — ATORVASTATIN CALCIUM 20 MG PO TABS
40.0000 mg | ORAL_TABLET | Freq: Every evening | ORAL | Status: DC
Start: 2022-04-03 — End: 2022-04-06
  Administered 2022-04-03 – 2022-04-05 (×3): 40 mg via ORAL
  Filled 2022-04-03 (×2): qty 2

## 2022-04-03 MED ORDER — PANTOPRAZOLE SODIUM 20 MG PO TBEC
20.0000 mg | DELAYED_RELEASE_TABLET | Freq: Every day | ORAL | Status: DC
  Administered 2022-04-03 – 2022-04-06 (×4): 20 mg via ORAL
  Filled 2022-04-03 (×4): qty 1

## 2022-04-03 MED ORDER — ASPIRIN 81 MG PO CHEW
324.0000 mg | CHEWABLE_TABLET | Freq: Once | ORAL | Status: AC
  Administered 2022-04-03: 324 mg via ORAL
  Filled 2022-04-03: qty 4

## 2022-04-03 MED ORDER — HEPARIN (PORCINE) 25000 UT/250ML-% IV SOLN: 10.0000 [IU]/kg/h | INTRAVENOUS | Status: DC

## 2022-04-03 MED ORDER — METOPROLOL TARTRATE 25 MG PO TABS
12.5000 mg | ORAL_TABLET | Freq: Two times a day (BID) | ORAL | Status: DC
  Administered 2022-04-03 (×2): 12.5 mg via ORAL
  Filled 2022-04-03 (×3): qty 1

## 2022-04-03 MED ORDER — METOPROLOL TARTRATE 5 MG/5ML IV SOLN: 2.5000 mg | Freq: Four times a day (QID) | INTRAVENOUS | Status: DC | PRN

## 2022-04-03 MED ORDER — HEPARIN BOLUS VIA INFUSION
3600.0000 [IU] | Freq: Once | INTRAVENOUS | Status: AC
Start: 2022-04-03 — End: 2022-04-03
  Administered 2022-04-03: 3600 [IU] via INTRAVENOUS
  Filled 2022-04-03: qty 3600

## 2022-04-03 NOTE — ED Notes (Signed)
Pt's monitor alarm temporarily silenced for her; pulse ox cord changed out for pt.

## 2022-04-03 NOTE — ED Notes (Signed)
Orinda Kenner PA confirms Cards order to stopped heparin gtt at this time. Attending [place] Crane updated via secure chat.

## 2022-04-03 NOTE — ED Provider Notes (Signed)
[facility] Provider Note    Event Date/Time   First MD Initiated Contact with Patient 04/03/22 [id number]     (approximate)   History   Shortness of Breath   HPI  Jordan Thornton is a 88 y.o. female who reports she had an episode last night of shortness of breath.  She just could not seem to get her breath and she got some rattling she said.  She also had some pain in her shoulder that seemed to improve if she moves one way and get worse if she moves a different way.  She was up all night long because she could not lay down flat at times lay down flat she gets short of breath.      Physical Exam   Triage Vital Signs: ED Triage Vitals  Enc Vitals Group     BP 04/03/22 0659 (!) 160/97     Pulse Rate 04/03/22 0659 98     Resp 04/03/22 0659 20     Temp 04/03/22 0659 99 F (37.2 C)     Temp src --      SpO2 04/03/22 0659 98 %     Weight 04/03/22 0651 131 lb (59.4 kg)     Height 04/03/22 0651 5\' 5"  (1.651 m)     Head Circumference --      Peak Flow --      Pain Score --      Pain Loc --      Pain Edu? --      Excl. in [place]? --     Most recent vital signs: Vitals:   04/03/22 0659  BP: (!) 160/97  Pulse: 98  Resp: 20  Temp: 99 F (37.2 C)  SpO2: 98%     General: Awake, no distress.  CV:  Good peripheral perfusion.  Heart regular rate and rhythm no audible murmurs Resp:  Normal effort.  Lungs are clear Abd:  No distention.  Soft bowel sounds positive nontender Extremities with no edema   ED Results / Procedures / Treatments   Labs (all labs ordered are listed, but only abnormal results are displayed) Labs Reviewed  BASIC METABOLIC PANEL - Abnormal; Notable for the following components:      Result Value   Chloride 114 (*)    Glucose, Bld 159 (*)    BUN 32 (*)    Creatinine, Ser 2.10 (*)    GFR, Estimated 22 (*)    All other components within normal limits  CBC - Abnormal; Notable for the following components:   MCV 78.7 (*)     MCH 25.5 (*)    All other components within normal limits  BRAIN NATRIURETIC PEPTIDE - Abnormal; Notable for the following components:   B Natriuretic Peptide 2,133.6 (*)    All other components within normal limits  CBC WITH DIFFERENTIAL/PLATELET - Abnormal; Notable for the following components:   MCV 79.0 (*)    Neutro Abs 7.8 (*)    All other components within normal limits  TROPONIN I (HIGH SENSITIVITY) - Abnormal; Notable for the following components:   Troponin I (High Sensitivity) 126 (*)    All other components within normal limits  PROTIME-INR  APTT  HEPARIN LEVEL (UNFRACTIONATED)  TROPONIN I (HIGH SENSITIVITY)     EKG  EKG read and interpreted by me shows sinus tachycardia rate of 104 left axis frequent PVCs poor R wave progression flipped T's in aVL but no obvious acute ST-T changes otherwise.  RADIOLOGY Chest x-ray read interpreted by me shows some haziness in the bases. Chest x-ray now read by radiology trace pleural effusion chronic cardiomegaly mild CHF.  PROCEDURES:  Critical Care performed:   Procedures   MEDICATIONS ORDERED IN ED: Medications  heparin ADULT infusion 100 units/mL (25000 units/212mL) (700 Units/hr Intravenous New Bag/Given 04/03/22 0849)  aspirin EC tablet 81 mg (has no administration in time range)  pantoprazole (PROTONIX) EC tablet 20 mg (has no administration in time range)  atorvastatin (LIPITOR) tablet 40 mg (has no administration in time range)  acetaminophen (TYLENOL) tablet 650 mg (has no administration in time range)  prochlorperazine (COMPAZINE) injection 10 mg (has no administration in time range)  melatonin tablet 5 mg (has no administration in time range)  insulin aspart (novoLOG) injection 0-9 Units (has no administration in time range)  polyethylene glycol (MIRALAX / GLYCOLAX) packet 17 g (has no administration in time range)  furosemide (LASIX) injection 20 mg (has no administration in time range)  aspirin chewable  tablet 324 mg (324 mg Oral Given 04/03/22 0832)  heparin bolus via infusion 3,600 Units (3,600 Units Intravenous Bolus from Bag 04/03/22 0849)  furosemide (LASIX) injection 40 mg (40 mg Intravenous Given 04/03/22 0837)     IMPRESSION / MDM / ASSESSMENT AND PLAN / ED COURSE  I reviewed the triage vital signs and the nursing notes. Patient's GFR is 22 this has been stable.  Patient's troponin in the past has been 23 repeatedly now it is 126.  This makes me worried that maybe something was going on last night with her heart.  I will get her on some aspirin and heparin she does not have any history of GI bleeding or recent surgery or trauma.  We will plan on getting her in the hospital. I discussed her at the hospitalist request with Dr. Rockey Situ.  Dr. Rockey Situ found that Dr. Darcella Cheshire had been seeing the patient.  Dr. Clayborn Bigness wanted to give the patient some Lasix and let him go home and patient is happy with this but the hospital doctor and came back and talk to the patient doctor in the coming in the hospital.  I think probably this is because I had said the same thing because I was a little worried.  Dr. Clayborn Bigness of course knows the patient very well and I would trust him with anyone in my family.  Differential diagnosis includes, but is not limited to, this could have been an NSTEMI or just 6 CHF possibly.  Chest x-ray does not look appreciably different.  Patient does not have any fever or anything else which would lead me to suspect any other kind of cardiac problem like myocarditis or pericarditis.  Patient's presentation is most consistent with acute presentation with potential threat to life or bodily function.  The patient is on the cardiac monitor to evaluate for evidence of arrhythmia and/or significant heart rate changes.  Patient is having frequent PVCs.       FINAL CLINICAL IMPRESSION(S) / ED DIAGNOSES   Final diagnoses:  Dyspnea, unspecified type  Elevated troponin     Rx / DC  Orders   ED Discharge Orders     None        Note:  This document was prepared using Dragon voice recognition software and may include unintentional dictation errors.   Nena Polio, MD 04/03/22 [phone]

## 2022-04-03 NOTE — ED Notes (Signed)
Monitor adjusted for pt/family. Updated as requested. Pt denies any other needs currently.

## 2022-04-03 NOTE — ED Notes (Signed)
Pt given fresh warm blanket. Given recliner, pillow and warm blankets as requested for husband who will stay the night at bedside.

## 2022-04-03 NOTE — ED Notes (Addendum)
Pt given pillow; pt denies any other needs. Pt denies SOB, CP, HA. Call bell within reach, stretcher locked low, rail up.

## 2022-04-03 NOTE — Consult Note (Signed)
ANTICOAGULATION CONSULT NOTE - Initial Consult  Pharmacy Consult for heparin Indication: chest pain/ACS  Allergies  Allergen Reactions   Sertraline     Made her more depressed    Patient Measurements: Height: 5\' 5"  (165.1 cm) Weight: 59.4 kg (131 lb) IBW/kg (Calculated) : 57 Heparin Dosing Weight: 59.4kg  Vital Signs: Temp: 99 F (37.2 C) (07/21 0659) BP: 160/97 (07/21 0659) Pulse Rate: 98 (07/21 0659)  Labs: Recent Labs    04/03/22 0651  HGB 12.0  HCT 37.0  PLT 184  CREATININE 2.10*  TROPONINIHS 126*    Estimated Creatinine Clearance: 16 mL/min (A) (by C-G formula based on SCr of 2.1 mg/dL (H)).   Medical History: Past Medical History:  Diagnosis Date   Diabetes mellitus without complication (HCC)    GERD (gastroesophageal reflux disease)    Hyperlipidemia    Hypertension     Medications:  PTA: Aspirin EC 81mg  PO QD Inpatient: Heparin infusion (7/21 >>) Allergies: No AC/APT related allergies  Assessment: 88yo f  Goal of Therapy:  Heparin level 0.3-0.7 units/ml Monitor platelets by anticoagulation protocol: Yes    Date Time aPTT/HL Rate/Comment   Plan:      Order STAT aPTT  Give 3600 units bolus x1; then start heparin infusion at 700 units/hr Check anti-Xa level in 8 hours and daily once consecutively therapeutic. Continue to monitor H&H and platelets daily while on heparin gtt.   Thank you for allowing pharmacy to be a part of this patient's care.  Darrick Penna Clinical Pharmacist 04/03/2022 8:01 AM

## 2022-04-03 NOTE — Consult Note (Addendum)
[place] NOTE       Patient ID: Jordan Thornton MRN: [id number] DOB/AGE: March 04, 1930 88 y.o.  Admit date: 04/03/2022 Referring Physician Dr. Irene Pap Primary Physician Dr. Otilio Miu Primary Cardiologist Dr. Clayborn Bigness (last seen in 2020)  Reason for Consultation acute on chronic HFrEF   HPI: Jordan Thornton is an 65yoF with a PMH of type 2 diabetes, CKD 4, hypertension, hyperlipidemia, newly diagnosed HFrEF with LVEF 30-35%, LVH, G1 DD and mild MR 01/2022), GERD who presented to [facility] ED 04/03/2022 with shortness of breath when laying flat that woke her up from her sleep last night.  Cardiology is consulted with assistance with her heart failure.  She has been in her usual state of health for the past week, even feeling well enough to scrub her floors in her home yesterday.  She said she went to bed and at some point she woke up in the middle of the night gasping for air and spent the rest of the night sitting up in an incline in order to feel comfortable.  She had some left shoulder pain that improved with repositioning is gone away.  She did not have any dyspnea on exertion throughout the day or through the past week, but notes now some dry coughing and rattling in her chest.  She denies chest pain or discomfort, palpitations, abdominal or lower extremity swelling.  She has had some fluctuating issues with poor appetite and was losing weight a couple months ago, but this is improved lately.  She says she feels much better from a breathing standpoint during interview than when she initially presented to the ED several hours ago -has received 40 mg of IV Lasix so far.  She was last seen by Dr. Clayborn Bigness in 2020 where she was continued on her medicines for hypertension.  She had a normal Lexiscan Myoview in 2019 that was ordered for some possible anginal symptoms.  She was lost to follow-up thereafter, but has been closely followed by her nephrologist and primary care  physician.  She was hospitalized in May 2023 after a syncopal episode thought to be vasovagal versus orthostatic in nature.  She had an echocardiogram was done which showed a reduced ejection fraction of 30-35%, much decreased from 2021 where EF was 60 to 65%.  She was not seen by cardiology during this admission nor was follow-up recommended.  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  Lives at home with her husband and likes to do 1000 piece puzzles in her spare time.  Vitals are notable for a blood pressure of 125/93, heart rate in sinus rhythm with frequent PVCs on telemetry rate in the 80s to 90s with a short period of rate in the 110s.  SPO2 98% on room air.  Labs are notable for potassium of 4.4, BUN/creatinine 32/2.10, GFR 22 which appear to be around her baseline.  BNP markedly elevated at 2133.  High-sensitivity troponin elevated but flat trending at 126-179.  H&H stable 12/36.6, platelets 170.  Without leukocytosis.  Chest x-ray with trace bilateral pleural effusions and cardiomegaly.   Review of systems complete and found to be negative unless listed above     Past Medical History:  Diagnosis Date   Diabetes mellitus without complication ([place])    GERD (gastroesophageal reflux disease)    Hyperlipidemia    Hypertension     Past Surgical History:  Procedure Laterality Date  bladder tack     BREAST BIOPSY Left 10-15 yrs ago   benign   CATARACT EXTRACTION Bilateral 05/2015   CHOLECYSTECTOMY OPEN     VAGINAL HYSTERECTOMY      (Not in a hospital admission)  Social History   Socioeconomic History   Marital status: Married    Spouse name: Not on file   Number of children: 2   Years of education: Not on file   Highest education level: Bachelor's degree (e.g., BA, AB, BS)  Occupational History    Employer: RETIRED  Tobacco Use   Smoking status: Former    Packs/day: 0.25     Years: 25.00    Total pack years: 6.25    Types: Cigarettes    Quit date: 1982    Years since quitting: 41.5   Smokeless tobacco: Never   Tobacco comments:    smoking cessation materials not required  Vaping Use   Vaping Use: Never used  Substance and Sexual Activity   Alcohol use: No    Alcohol/week: 0.0 standard drinks of alcohol   Drug use: No   Sexual activity: Not Currently  Other Topics Concern   Not on file  Social History Narrative   Not on file   Social Determinants of Health   Financial Resource Strain: Low Risk  (02/23/2022)   Overall Financial Resource Strain (CARDIA)    Difficulty of Paying Living Expenses: Not hard at all  Food Insecurity: No Food Insecurity (02/23/2022)   Hunger Vital Sign    Worried About Running Out of Food in the Last Year: Never true    Ran Out of Food in the Last Year: Never true  Transportation Needs: No Transportation Needs (02/23/2022)   PRAPARE - Hydrologist (Medical): No    Lack of Transportation (Non-Medical): No  Physical Activity: Inactive (02/23/2022)   Exercise Vital Sign    Days of Exercise per Week: 0 days    Minutes of Exercise per Session: 0 min  Stress: No Stress Concern Present (02/23/2022)   [place]    Feeling of Stress : Only a little  Social Connections: Moderately Integrated (02/23/2022)   Social Connection and Isolation Panel [NHANES]    Frequency of Communication with Friends and Family: More than three times a week    Frequency of Social Gatherings with Friends and Family: Three times a week    Attends Religious Services: More than 4 times per year    Active Member of Clubs or Organizations: No    Attends Archivist Meetings: Never    Marital Status: Married  Human resources officer Violence: Not At Risk (02/23/2022)   Humiliation, Afraid, Rape, and Kick questionnaire    Fear of Current or Ex-Partner: No     Emotionally Abused: No    Physically Abused: No    Sexually Abused: No    Family History  Problem Relation Age of Onset   Heart disease Maternal Aunt    Heart disease Maternal Grandmother    Stroke Father    Breast cancer Neg Hx       PHYSICAL EXAM General: Very pleasant thin black female, appears comfortable and much younger than stated age.   HEENT:  Normocephalic and atraumatic. Neck:  No JVD.  Lungs: Normal respiratory effort on room air.  Bibasilar crackles heart: HRRR . Normal S1 and S2 without gallops or murmurs. Radial & DP pulses 2+ bilaterally. Abdomen: Non-distended appearing.  Msk:  Normal strength and tone for age. Extremities: Warm and well perfused. No clubbing, cyanosis.  No peripheral edema.  Neuro: Alert and oriented X 3. Psych:  Answers questions appropriately.   Labs:   Lab Results  Component Value Date   WBC 10.1 04/03/2022   WBC 10.1 04/03/2022   HGB 12.0 04/03/2022   HGB 12.1 04/03/2022   HCT 37.0 04/03/2022   HCT 36.6 04/03/2022   MCV 78.7 (L) 04/03/2022   MCV 79.0 (L) 04/03/2022   PLT 184 04/03/2022   PLT 170 04/03/2022    Recent Labs  Lab 04/03/22 0651  NA 142  K 4.4  CL 114*  CO2 22  BUN 32*  CREATININE 2.10*  CALCIUM 9.6  GLUCOSE 159*   No results found for: "CKTOTAL", "CKMB", "CKMBINDEX", "TROPONINI"  Lab Results  Component Value Date   CHOL 167 02/01/2022   CHOL 207 (H) 07/03/2021   CHOL 180 10/29/2020   Lab Results  Component Value Date   HDL 59 02/01/2022   HDL 71 07/03/2021   HDL 67 10/29/2020   Lab Results  Component Value Date   LDLCALC 96 02/01/2022   LDLCALC 119 (H) 07/03/2021   LDLCALC 97 10/29/2020   Lab Results  Component Value Date   TRIG 61 02/01/2022   TRIG 98 07/03/2021   TRIG 90 10/29/2020   Lab Results  Component Value Date   CHOLHDL 2.8 02/01/2022   CHOLHDL 2.8 03/02/2018   CHOLHDL 2.7 02/25/2017   No results found for: "LDLDIRECT"    Radiology: DG Chest 2 View  Result Date:  04/03/2022 CLINICAL DATA:  Shortness of breath EXAM: CHEST - 2 VIEW COMPARISON:  01/31/2022 FINDINGS: Interstitial coarsening with fissure thickening and a few suspected Kerley lines. Trace pleural effusion. Chronic cardiomegaly. IMPRESSION: Mild CHF. Electronically Signed   By: Jorje Guild M.D.   On: 04/03/2022 07:38    03/30/2022  Probably normal myocardial perfusion scan normal uptake of  Myoview on stress images stress images had minimal uptake ejection  fraction was thought to be around 66% because of the poor uptake during  rest this study is of relatively low quality but the stress images  suggests no significant coronary disease or ischemia recommend  conservative medical therapy  Narrative  CARDIOLOGY DEPARTMENT  [facility]  [employer], [place], [place]  [address]  [phone]   Procedure: Pharmacologic Myocardial Perfusion Imaging    ONE day procedure   Indication: Angina at rest (CMS-HCC)  Plan: NM myocardial perfusion SPECT multiple (stress        and rest), ECG stress test only   Ordering Physician:   Dr. Lujean Amel    Clinical History:  88 y.o. year old female recent anginal symptoms  Vitals: Height: 65 in Weight: 148 lb  Cardiac risk factors include:     Hyperlipidemia and HTN    Procedure:   Pharmacologic stress testing was performed with Regadenoson using a single  use 0.4mg /14ml (0.08 mg/ml) prefilled syringe intravenously infused as a  bolus dose. The stress test was stopped due to Infusion completion.  Blood  pressure response was normal. The patient did not develop any symptoms  other than fatigue during the procedure.   Rest HR: 65bpm  Rest BP: 148/51mmHg  Max HR: 100bpm  Min BP: 152/21mmHg   Stress Test Administered by: Oswald Hillock, CMA   ECG Interpretation:  Rest ECG:  normal sinus rhythm, none  Stress ECG:  normal sinus rhythm,  Recovery ECG:  normal sinus rhythm  ECG Interpretation:   non-diagnostic due to pharmacologic testing.    Administrations This Visit     regadenoson (LEXISCAN) 0.4 mg/5 mL inj syringe 0.4 mg     Admin Date  03/28/2018 Action  Given Dose  0.4 mg Route  Intravenous Administered By  Herbert Seta, CNMT     technetium Tc30m sestamibi (CARDIOLITE) injection 57.32 millicurie     Admin Date  03/28/2018 Action  Given Dose  20.25 millicurie Route  Intravenous Administered By  Herbert Seta, CNMT     technetium Tc44m sestamibi (CARDIOLITE) injection 42.7 millicurie     Admin Date  03/28/2018 Action  Given Dose  06.2 millicurie Route  Intravenous Administered By  Herbert Seta, CNMT   Gated post-stress perfusion imaging was performed 30 minutes after stress.  Rest images were performed 30 minutes after injection.   Gated LV Analysis:  TID Ratio: 0.97   LVEF= 66 %   FINDINGS:  Regional wall motion:  reveals normal myocardial thickening and wall  motion.  The overall quality of the study is poor.    Artifacts noted: no  Left ventricular cavity: normal.   Perfusion Analysis:  SPECT images demonstrate homogeneous tracer  distribution throughout the myocardium.    ECHO 02/02/2022   1. Left ventricular ejection fraction, by estimation, is 30 to 35%. The  left ventricle has moderately decreased function. The left ventricle  demonstrates regional wall motion abnormalities (see scoring  diagram/findings for description). There is mild  concentric left ventricular hypertrophy. Left ventricular diastolic  parameters are consistent with Grade I diastolic dysfunction (impaired  relaxation).   2. Right ventricular systolic function is normal. The right ventricular  size is normal.   3. The mitral valve is normal in structure. Mild mitral valve  regurgitation.   4. The aortic valve is normal in structure. Aortic valve regurgitation is  not visualized.   FINDINGS   Left Ventricle: Left ventricular ejection fraction, by estimation,  is 30  to 35%. The left ventricle has moderately decreased function. The left  ventricle demonstrates regional wall motion abnormalities. The left  ventricular internal cavity size was  normal in size. There is mild concentric left ventricular hypertrophy.  Left ventricular diastolic parameters are consistent with Grade I  diastolic dysfunction (impaired relaxation).   Right Ventricle: The right ventricular size is normal. No increase in  right ventricular wall thickness. Right ventricular systolic function is  normal.   Left Atrium: Left atrial size was normal in size.   Right Atrium: Right atrial size was normal in size.   Pericardium: There is no evidence of pericardial effusion.   Mitral Valve: The mitral valve is normal in structure. Mild mitral valve  regurgitation.   Tricuspid Valve: The tricuspid valve is normal in structure. Tricuspid  valve regurgitation is mild.   Aortic Valve: The aortic valve is normal in structure. Aortic valve  regurgitation is not visualized. Aortic valve peak gradient measures 4.0  mmHg.   Pulmonic Valve: The pulmonic valve was normal in structure. Pulmonic valve  regurgitation is not visualized.   Aorta: The ascending aorta was not well visualized.   IAS/Shunts: No atrial level shunt detected by color flow Doppler.      LEFT VENTRICLE  PLAX 2D  LVIDd:         4.40 cm      Diastology  LVIDs:         3.80 cm      LV e' medial:  5.00 cm/s  LV PW:         1.40 cm      LV E/e' medial:  6.3  LV IVS:        1.40 cm      LV e' lateral:   7.62 cm/s  LVOT diam:     1.90 cm      LV E/e' lateral: 4.1  LV SV:         28  LV SV Index:   17  LVOT Area:     2.84 cm     LV Volumes (MOD)  LV vol d, MOD A2C: 122.0 ml  LV vol d, MOD A4C: 125.0 ml  LV vol s, MOD A2C: 81.6 ml  LV vol s, MOD A4C: 70.1 ml  LV SV MOD A2C:     40.4 ml  LV SV MOD A4C:     125.0 ml  LV SV MOD BP:      51.8 ml   RIGHT VENTRICLE  RV Basal diam:  2.20 cm  RV S prime:      13.70 cm/s  TAPSE (M-mode): 1.9 cm   LEFT ATRIUM             Index        RIGHT ATRIUM           Index  LA diam:        2.30 cm 1.41 cm/m   RA Area:     11.90 cm  LA Vol (A2C):   45.8 ml 28.13 ml/m  RA Volume:   31.20 ml  19.16 ml/m  LA Vol (A4C):   40.0 ml 24.56 ml/m  LA Biplane Vol: 45.9 ml 28.19 ml/m   AORTIC VALVE                 PULMONIC VALVE  AV Area (Vmax): 1.63 cm     PV Vmax:       0.84 m/s  AV Vmax:        100.00 cm/s  PV Peak grad:  2.8 mmHg  AV Peak Grad:   4.0 mmHg  LVOT Vmax:      57.40 cm/s  LVOT Vmean:     39.300 cm/s  LVOT VTI:       0.099 m     AORTA  Ao Root diam: 3.10 cm  Ao Asc diam:  2.80 cm   MITRAL VALVE               TRICUSPID VALVE  MV Area (PHT): 1.70 cm    TV Peak grad:   22.7 mmHg  MV Decel Time: 446 msec    TV Vmax:        2.38 m/s  MV E velocity: 31.30 cm/s  MV A velocity: 84.40 cm/s  SHUNTS  MV E/A ratio:  0.37        Systemic VTI:  0.10 m                             Systemic Diam: 1.90 cm   Dwayne D Callwood MD  Electronically signed by Yolonda Kida MD  Signature Date/Time: 02/02/2022/5:15:18 PM   TELEMETRY reviewed by me: Sinus rhythm with frequent PVCs and PACs, rates 80s to 90s with a short period in the 110s.  EKG reviewed by me: Sinus rhythm rate 96, ventricular bigeminy, anterolateral T wave inversions, improved from EKG 01/2022.  ASSESSMENT AND PLAN:  Jordan Thornton  is an 58yoF with a PMH of type 2 diabetes, CKD 4, hypertension, hyperlipidemia, newly diagnosed HFrEF with LVEF 30-35%, LVH, G1 DD and mild MR 01/2022), GERD who presented to [facility] ED 04/03/2022 with shortness of breath when laying flat that woke her up from her sleep last night.  Cardiology is consulted with assistance with her heart failure.  #Acute on chronic HFrEF (LVEF 30-35%, LVH, G1 DD 01/2022) #Elevated troponin She presents with an episode of PND last night, was in her usual state of health earlier in the week.  She complained of some left shoulder  discomfort that resolved with position changes and without chest heaviness or discomfort.  She does not appear grossly volume overloaded but has crackles in her lungs on exam and BNP markedly elevated at 2100.  Her troponin is elevated with a flat trend at 126-176, suggestive of demand ischemia and not ACS.  She has not had an ischemic evaluation since 2019 with a Myoview that did not show any inducible ischemia been seen by cardiology in several years.  This reduced ejection fraction is a new finding from January 2021 where EF was 60 to 65%, consider further evaluation pending hospital course, although more likely to be done an outpatient basis. -S/p aspirin 325 mg, continue aspirin 81 mg daily for primary prevention -Discontinue heparin GTT, okay with DVT prophylaxis -Agree with atorvastatin 40 mg, was previously on simvastatin 20 mg daily -S/p IV Lasix 40 mg x 1 with symptom improvement.  Agree with 20 mg IV Lasix x 1 this afternoon.  Recheck renal function. -We will initiate a beta-blocker with metoprolol tartrate 12.5 mg twice daily, isordil 10mg  TID, and hydralazine 10mg  TID for afterload reduction and GDMT, with goals to uptitrate.  - hold home losartan 25 mg.  -Strict I's/O, daily weights -Repeat limited echocardiogram  #CKD4 Renal function appears to be at baseline with creatinine 2.1, GFR 22.  Monitor closely with diuresis  This patient's plan of care was discussed and created with Dr. Clayborn Bigness and he is in agreement.  Signed: Tristan Schroeder , PA-C 04/03/2022, 9:55 AM [facility]

## 2022-04-03 NOTE — ED Notes (Signed)
Attending Nevada Crane notified via secure chat of possible need for bed order change as no ICU bed currently available and pt no longer on heparin gtt which was primary reason for stepdown order. Awaiting reply.

## 2022-04-03 NOTE — Progress Notes (Signed)
Brief progress note Patient doing much better with diuresis Blood pressure much improved Troponins elevated to about 500 patient asymptomatic Would still recommend a conservative course Follow-up further troponins I do not recommend any invasive strategy and recommend continue medical therapy  Imdur hydralazine continue losartan aspirin we may add Plavix for 6 months  Without chest pain or angina with her significant renal sufficiency catheterization with contrast which benefit I do not believe is in favor of an invasive strategy.  Patient has systolic cardiomyopathy we will continue to treat accordingly in addition to diuretics outpatient follow-up with nephrology as an outpatient  We will continue to watch the patient for another 24 to 48 hours

## 2022-04-03 NOTE — ED Triage Notes (Signed)
Pt presents via POV with complaints of SOB that started last night. Pt states that she "felt winded" and has had some discomfort in her chest without radiation in pain. Denies CP, fevers, chills, or N/V.

## 2022-04-03 NOTE — ED Notes (Signed)
Attending notified pt's [employer] from 179 to 585. No new orders.

## 2022-04-03 NOTE — H&P (Addendum)
History and Physical  Jordan Thornton HFW:[id number] DOB: 1930/07/12 DOA: 04/03/2022  Referring physician: Dr. Cinda Quest, [place].  PCP: Juline Patch, MD  Outpatient Specialists: Cardiology, nephrology. Patient coming from: Home.  Chief Complaint: Sudden onset shortness of breath while laying down in bed.  HPI: Jordan Thornton is a 88 y.o. female with medical history significant for chronic combined diastolic and systolic CHF, essential hypertension, hyperlipidemia, type 2 diabetes, CKD 3B, GERD, chronic anxiety/depression, who presented to [facility] ED with complaints of sudden onset shortness of breath while laying in bed last night.  Improved with increased angle of head of bed.  Denies any chest pain.  Denies any lower extremity edema.  No cough.  No fever.  Endorses left shoulder pain that seems positional.  No other complaints.  States she is never had this type of symptoms before.  No prior history of MI.  She presented to the ED for further evaluation.    While in the ED, work-up reveals elevated troponin greater than 100.  No evidence of acute ischemia on 12-lead EKG.  She received 1 full dose of aspirin and was started on heparin drip.  EDP discussed the case with cardiology, Dr. Clayborn Bigness, who recommended IV Lasix, she received 40 mg x 1.  TRH, hospitalist service was asked to admit.  When asked if she is willing to be transferred to [facility] if her troponin continues to uptrend the patient declines.  She states that [facility] is too far away at least 45 minutes from her home and she would like for her husband to visit.  ED Course: Tmax 99.  BP 139/98, pulse 97, respiration rate 23, O2 saturation 99% on room air.  Lab studies significant for creatinine 2.10, GFR 22.  Troponin 126.  CBC essentially unremarkable.  Review of Systems: Review of systems as noted in the HPI. All other systems reviewed and are negative.   Past Medical History:  Diagnosis Date    Diabetes mellitus without complication (HCC)    GERD (gastroesophageal reflux disease)    Hyperlipidemia    Hypertension    Past Surgical History:  Procedure Laterality Date   bladder tack     BREAST BIOPSY Left 10-15 yrs ago   benign   CATARACT EXTRACTION Bilateral 05/2015   CHOLECYSTECTOMY OPEN     VAGINAL HYSTERECTOMY      Social History:  reports that she quit smoking about 41 years ago. Her smoking use included cigarettes. She has a 6.25 pack-year smoking history. She has never used smokeless tobacco. She reports that she does not drink alcohol and does not use drugs.   Allergies  Allergen Reactions   Sertraline     Made her more depressed    Family History  Problem Relation Age of Onset   Heart disease Maternal Aunt    Heart disease Maternal Grandmother    Stroke Father    Breast cancer Neg Hx       Prior to Admission medications   Medication Sig Start Date End Date Taking? Authorizing Provider  Acetaminophen (TYLENOL ARTHRITIS PAIN PO) Take 2 tablets by mouth daily as needed.     [provider]  ADVOCATE LANCETS MISC use once daily as directed to test blood sugar 04/24/15   Juline Patch, MD  aspirin EC 81 MG tablet Take 1 tablet (81 mg total) by mouth daily. Swallow whole. 02/02/22   Lorella Nimrod, MD  Blood Glucose Monitoring Suppl (FREESTYLE LITE) DEVI use once daily  as directed to test blood sugar 04/24/15   Juline Patch, MD  busPIRone (BUSPAR) 7.5 MG tablet Take 7.5 mg by mouth 2 (two) times daily. Patient not taking: Reported on 03/02/2022 02/02/22   [provider]  Cholecalciferol (VITAMIN D) 2000 units CAPS Take 1 capsule by mouth daily. otc    [provider]  Elderberry 575 MG/5ML SYRP Take 1 Dose by mouth daily.     [provider]  FREESTYLE LITE test strip USE 1 STRIP TO CHECK GLUCOSE ONCE DAILY AS DIRECTED 11/26/21   Juline Patch, MD  JANUVIA 25 MG tablet Take 1 tablet (25 mg total) by mouth daily. 03/02/22    Juline Patch, MD  Lancet Devices (ADVOCATE LANCING DEVICE) MISC use once daily as directed to test blood sugar 04/24/15   Juline Patch, MD  lansoprazole (PREVACID) 15 MG capsule Take 1 capsule by mouth once daily Patient taking differently: otc 06/23/21   Juline Patch, MD  LORazepam (ATIVAN) 0.5 MG tablet Take 1 tablet (0.5 mg total) by mouth as needed. Per 30 days 01/27/22   Juline Patch, MD  losartan (COZAAR) 25 MG tablet Take 25 mg by mouth daily. 02/06/21   [provider]  Omega-3 Fatty Acids (FISH OIL) 1000 MG CAPS Take 1 capsule (1,000 mg total) by mouth daily. 11/17/19   Juline Patch, MD  simvastatin (ZOCOR) 20 MG tablet Take 1 tablet (20 mg total) by mouth daily at 6 PM. 03/02/22   Juline Patch, MD    Physical Exam: BP (!) 160/97   Pulse 98   Temp 99 F (37.2 C)   Resp 20   Ht 5\' 5"  (1.651 m)   Wt 59.4 kg   SpO2 98%   BMI 21.80 kg/m   General: 88 y.o. year-old female well developed well nourished in no acute distress.  Alert and oriented x3. Cardiovascular: Regular rate and rhythm with no rubs or gallops.  No thyromegaly or JVD noted.  No lower extremity edema. 2/4 pulses in all 4 extremities. Respiratory: Mild rales at bases.  No wheezing noted. Good inspiratory effort. Abdomen: Soft nontender nondistended with normal bowel sounds x4 quadrants. Muskuloskeletal: No cyanosis, clubbing or edema noted bilaterally Neuro: CN II-XII intact, strength, sensation, reflexes Skin: No ulcerative lesions noted or rashes Psychiatry: Judgement and insight appear normal. Mood is appropriate for condition and setting          Labs on Admission:  Basic Metabolic Panel: Recent Labs  Lab 04/03/22 0651  NA 142  K 4.4  CL 114*  CO2 22  GLUCOSE 159*  BUN 32*  CREATININE 2.10*  CALCIUM 9.6   Liver Function Tests: No results for input(s): "AST", "ALT", "ALKPHOS", "BILITOT", "PROT", "ALBUMIN" in the last 168 hours. No results for input(s): "LIPASE", "AMYLASE" in  the last 168 hours. No results for input(s): "AMMONIA" in the last 168 hours. CBC: Recent Labs  Lab 04/03/22 0651  WBC 10.1  10.1  NEUTROABS 7.8*  HGB 12.1  12.0  HCT 36.6  37.0  MCV 79.0*  78.7*  PLT 170  184   Cardiac Enzymes: No results for input(s): "CKTOTAL", "CKMB", "CKMBINDEX", "TROPONINI" in the last 168 hours.  BNP (last 3 results) Recent Labs    01/31/22 1400  BNP 1,814.0*    ProBNP (last 3 results) No results for input(s): "PROBNP" in the last 8760 hours.  CBG: No results for input(s): "GLUCAP" in the last 168 hours.  Radiological Exams on Admission:  DG Chest 2 View  Result Date: 04/03/2022 CLINICAL DATA:  Shortness of breath EXAM: CHEST - 2 VIEW COMPARISON:  01/31/2022 FINDINGS: Interstitial coarsening with fissure thickening and a few suspected Kerley lines. Trace pleural effusion. Chronic cardiomegaly. IMPRESSION: Mild CHF. Electronically Signed   By: Jorje Guild M.D.   On: 04/03/2022 07:38    EKG: I independently viewed the EKG done and my findings are as followed: Sinus tachycardia with frequent PVCs.  Nonspecific ST-T changes.  Assessment/Plan Present on Admission: **None**  Principal Problem:   Acute CHF (congestive heart failure) (HCC)  Acute on chronic combined diastolic and systolic CHF. Endorses sudden onset orthopnea and nonexertional dyspnea. Last 2D echo done on 02/01/2022 revealed LVEF 30 to 35% and grade 1 diastolic dysfunction. We will obtain BNP, elevated greater than 2100. Mild pulmonary edema seen on chest x-ray, personally reviewed. IV Lasix 40 mg x 1 given in the ED. Start strict I's and O's and daily weight Repeat 2D echo limited Cardiology Dr. Clayborn Bigness consulted.  Elevated troponin, rule out ACS She denies any chest pain, had reported transient left shoulder pain that seemed positional. Initial high-sensitivity troponin 126 No evidence of acute ischemia on twelve-lead EKG. Received full dose aspirin 324 mg x  1 Started on heparin drip in the ED, continue until seen by cardiology. N.p.o. until seen by cardiology. Cycle troponin x2 Serial EKG Closely monitor in stepdown unit.  Mild pulmonary edema, seen on chest x-ray secondary to acute CHF No hypoxia, with O2 saturation 99% on room air IV diuresing started in the ED. Continue diuresing, closely monitor urine output and renal function while on diuresis.  Hyperlipidemia Last LDL 96 on 02/01/2022 She is on home simvastatin 20 mg daily Started Lipitor 40 mg daily. Defer further management to cardiology  Type 2 diabetes with hyperglycemia Last hemoglobin A1c 6.5 on 01/31/2022 Insulin sliding scale every 4 hours while NPO  AKI on CKD 3B Baseline creatinine appears to be 1.6 with GFR of 32 Presented with creatinine of 2.10 with GFR 22 Avoid nephrotoxic agents and hypotension. Closely monitor urine output with strict I's and O's while on IV diuretics Repeat renal panel in the morning.  Chronic anxiety/depression Stable at this time Resume home regimen once home medications have been reconciled.  GERD Resume home PPI   Critical care time: 65 minutes.    DVT prophylaxis: Heparin drip  Code Status: Full code as stated by the patient herself who is alert and oriented x4.  Family Communication: Her son at bedside has been updated.  Disposition Plan: Admitted to stepdown unit  Consults called: Cardiology consulted, Dr. Girtha Rm.  Admission status: Inpatient status.   Status is: Inpatient The patient requires at least 2 midnights for further evaluation and treatment of present condition.   Kayleen Memos MD [facility] Pager [phone]  If 7PM-7AM, please contact night-coverage [email or web address] Password [email or web address]  04/03/2022, 8:40 AM

## 2022-04-03 NOTE — ED Notes (Signed)
Pt done with dinner tray.

## 2022-04-03 NOTE — Consult Note (Signed)
   Heart Failure Nurse Navigator Note  HFrEF 30 to 35%.  Mild concentric LVH.  Grade 1 diastolic dysfunction.  Wall motion abnormality.  Normal right ventricular systolic function.  Mild mitral regurgitation.  Limited echocardiogram to be performed on this admission is currently pending.  Echocardiogram performed in January 2021 revealed an ejection fraction 60 to 65%.  Grade 2 diastolic dysfunction.  She presented to the emergency room with complaints of sudden shortness of breath with orthopnea.  This improved with sitting up.  Comorbidities:  Hypertension Hyperlipidemia Diabetes Chronic kidney disease stage III GERD Anxiety/depression  Medications:  Aspirin 81 mg daily Atorvastatin 40 mg every p.m. Furosemide 20 mg IV twice a day Metoprolol tartrate 12.5 mg twice a day  Home medications included aspirin, Januvia, losartan and Zocor.  Labs:  BNP 2133, sodium 142, potassium 4.4, chloride 114, CO2 22, BUN 32, creatinine 2.1, hemoglobin 12, hematocrit 37, troponin levels 179 and 126. Weight is 59.4 kg Blood pressure 125/93  Initial meeting with patient in the ED.  He is currently lying on gurney supine, on room air in no acute distress.  She states that she resides at home with her husband of 15 years.  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  She and her husband have a son who lives just around the corner from their home.  She states that she keeps up her own home, the day of admission she had scrubbed and polished her floors without any difficulty.  Discussed heart failure and what it means.  States that she had not heard before the term heart failure in regards to herself.  Nor was she aware that her ejection fraction was 30 to 35%.   She states that she does weigh herself but not on a daily basis.  Explained the importance of daily weights,  recording and what to report.  She states that she does not cook or add salt at the table except for a little bit on her eggs.  Discussed eating foods that are healthier and lower in sodium content.  She voices understanding.  Discussed fluid restriction, patient states that she has a 50 ounce container that she feels with water daily and there are many days that she does not drink all of that.  Made aware of 64 ounce fluid restriction daily.  Which includes milk, coffee, tea, juices etc.  She voices understanding.  Made aware of the outpatient heart failure clinic for which she has an appointment on August 17 at 1130.  She has a 5% no-show which is 3 out of 55 appointments.  She was given the living with heart failure teaching booklet, zone magnet, info on low-sodium and heart failure along with weight chart.  She had no further questions.  Pricilla Riffle RN [facility]FN

## 2022-04-03 NOTE — ED Notes (Addendum)
Pt denies pain and denies SOB. Pt's resp reg/unlabored, skin dry and sitting calmly on stretcher. Family remains at bedside. Pt watching tv.

## 2022-04-03 NOTE — ED Notes (Signed)
Monitor placed on privacy mode per pt's request as it was "too loud". 2 family/friend at bedside.

## 2022-04-03 NOTE — ED Notes (Signed)
Pt received lunch tray without this RN's knowledge. Pt stated received it moments ago and finished up with it now.

## 2022-04-04 ENCOUNTER — Other Ambulatory Visit: Payer: Self-pay

## 2022-04-04 DIAGNOSIS — I959 Hypotension, unspecified: Secondary | ICD-10-CM

## 2022-04-04 DIAGNOSIS — I5023 Acute on chronic systolic (congestive) heart failure: Secondary | ICD-10-CM | POA: Diagnosis not present

## 2022-04-04 DIAGNOSIS — E785 Hyperlipidemia, unspecified: Secondary | ICD-10-CM

## 2022-04-04 DIAGNOSIS — I214 Non-ST elevation (NSTEMI) myocardial infarction: Secondary | ICD-10-CM | POA: Diagnosis not present

## 2022-04-04 DIAGNOSIS — E1169 Type 2 diabetes mellitus with other specified complication: Secondary | ICD-10-CM | POA: Diagnosis not present

## 2022-04-04 DIAGNOSIS — N184 Chronic kidney disease, stage 4 (severe): Secondary | ICD-10-CM

## 2022-04-04 LAB — CBC
HCT: 36.2 % (ref 36.0–46.0)
Hemoglobin: 11.8 g/dL — ABNORMAL LOW (ref 12.0–15.0)
MCH: 25.4 pg — ABNORMAL LOW (ref 26.0–34.0)
MCHC: 32.6 g/dL (ref 30.0–36.0)
MCV: 77.8 fL — ABNORMAL LOW (ref 80.0–100.0)
Platelets: 187 10*3/uL (ref 150–400)
RBC: 4.65 MIL/uL (ref 3.87–5.11)
RDW: 15.1 % (ref 11.5–15.5)
WBC: 7.4 10*3/uL (ref 4.0–10.5)
nRBC: 0 % (ref 0.0–0.2)

## 2022-04-04 LAB — RENAL FUNCTION PANEL
Albumin: 3.8 g/dL (ref 3.5–5.0)
Anion gap: 11 (ref 5–15)
BUN: 39 mg/dL — ABNORMAL HIGH (ref 8–23)
CO2: 25 mmol/L (ref 22–32)
Calcium: 9.7 mg/dL (ref 8.9–10.3)
Chloride: 106 mmol/L (ref 98–111)
Creatinine, Ser: 2.38 mg/dL — ABNORMAL HIGH (ref 0.44–1.00)
GFR, Estimated: 19 mL/min — ABNORMAL LOW (ref >60)
Glucose, Bld: 107 mg/dL — ABNORMAL HIGH (ref 70–99)
Phosphorus: 4.6 mg/dL (ref 2.5–4.6)
Potassium: 4.8 mmol/L (ref 3.5–5.1)
Sodium: 142 mmol/L (ref 135–145)

## 2022-04-04 LAB — CBG MONITORING, ED
Glucose-Capillary: 105 mg/dL — ABNORMAL HIGH (ref 70–99)
Glucose-Capillary: 99 mg/dL (ref 70–99)
Glucose-Capillary: 139 mg/dL — ABNORMAL HIGH (ref 70–99)

## 2022-04-04 LAB — GLUCOSE, CAPILLARY
Glucose-Capillary: 109 mg/dL — ABNORMAL HIGH (ref 70–99)
Glucose-Capillary: 180 mg/dL — ABNORMAL HIGH (ref 70–99)

## 2022-04-04 LAB — HEPARIN LEVEL (UNFRACTIONATED): Heparin Unfractionated: 0.39 IU/mL (ref 0.30–0.70)

## 2022-04-04 LAB — MAGNESIUM: Magnesium: 1.9 mg/dL (ref 1.7–2.4)

## 2022-04-04 LAB — TROPONIN I (HIGH SENSITIVITY): Troponin I (High Sensitivity): 1487 ng/L (ref <18)

## 2022-04-04 MED ORDER — HEPARIN BOLUS VIA INFUSION
3600.0000 [IU] | Freq: Once | INTRAVENOUS | Status: AC
  Administered 2022-04-04: 3600 [IU] via INTRAVENOUS
  Filled 2022-04-04: qty 3600

## 2022-04-04 MED ORDER — FUROSEMIDE 20 MG PO TABS
20.0000 mg | ORAL_TABLET | Freq: Every day | ORAL | Status: DC
  Administered 2022-04-05: 20 mg via ORAL
  Filled 2022-04-04: qty 1

## 2022-04-04 MED ORDER — INSULIN ASPART 100 UNIT/ML IJ SOLN: 0.0000 [IU] | Freq: Every day | INTRAMUSCULAR | Status: DC

## 2022-04-04 MED ORDER — CLOPIDOGREL BISULFATE 75 MG PO TABS
75.0000 mg | ORAL_TABLET | Freq: Every day | ORAL | Status: DC
  Administered 2022-04-04 – 2022-04-06 (×3): 75 mg via ORAL
  Filled 2022-04-04 (×3): qty 1

## 2022-04-04 MED ORDER — ORAL CARE MOUTH RINSE
15.0000 mL | OROMUCOSAL | Status: DC | PRN
Start: 2022-04-04 — End: 2022-04-06

## 2022-04-04 MED ORDER — INSULIN ASPART 100 UNIT/ML IJ SOLN
0.0000 [IU] | Freq: Three times a day (TID) | INTRAMUSCULAR | Status: DC
  Administered 2022-04-05: 2 [IU] via SUBCUTANEOUS
  Filled 2022-04-04: qty 1

## 2022-04-04 MED ORDER — METOPROLOL SUCCINATE ER 25 MG PO TB24
12.5000 mg | ORAL_TABLET | Freq: Every day | ORAL | Status: DC
Start: 2022-04-04 — End: 2022-04-06
  Administered 2022-04-04 – 2022-04-05 (×2): 12.5 mg via ORAL
  Filled 2022-04-04 (×2): qty 1

## 2022-04-04 MED ORDER — HEPARIN (PORCINE) 25000 UT/250ML-% IV SOLN
500.0000 [IU]/h | INTRAVENOUS | Status: DC
  Administered 2022-04-04: 500 [IU]/h via INTRAVENOUS
  Filled 2022-04-04 (×2): qty 250

## 2022-04-04 NOTE — Consult Note (Signed)
[place] for heparin Indication: chest pain/ACS  Allergies  Allergen Reactions   Sertraline     Made her more depressed    Patient Measurements: Height: 5\' 5"  (165.1 cm) Weight: 59.4 kg (131 lb) IBW/kg (Calculated) : 57 Heparin Dosing Weight: 59.4kg  Vital Signs: Temp: 97.9 F (36.6 C) (07/22 0823) Temp Source: Oral (07/22 0823) BP: 91/58 (07/22 0959) Pulse Rate: 70 (07/22 0959)  Labs: Recent Labs    04/03/22 0651 04/03/22 0800 04/03/22 0844 04/03/22 1908 04/03/22 2012 04/04/22 0500  HGB 12.1  12.0  --   --   --   --  11.8*  HCT 36.6  37.0  --   --   --   --  36.2  PLT 170  184  --   --   --   --  187  APTT  --  31  --   --   --   --   LABPROT  --  14.4  --   --   --   --   INR  --  1.1  --   --   --   --   CREATININE 2.10*  --   --   --   --  2.38*  TROPONINIHS 126*  --    < > 1,297* 1,597* 1,487*   < > = values in this interval not displayed.     Estimated Creatinine Clearance: 14.1 mL/min (A) (by C-G formula based on SCr of 2.38 mg/dL (H)).   Medical History: Past Medical History:  Diagnosis Date   Diabetes mellitus without complication (HCC)    GERD (gastroesophageal reflux disease)    Hyperlipidemia    Hypertension     Medications:  PTA: Aspirin EC 81mg  PO QD Inpatient: Heparin infusion (7/21 >>) Allergies: No AC/APT related allergies  Assessment: with a PMH of type 2 diabetes, CKD 4, hypertension, hyperlipidemia, newly diagnosed HFrEF with LVEF 30-35%, LVH, G1 DD and mild MR 01/2022), GERD who presented to [facility] ED 04/03/2022 with shortness of breath.   Goal of Therapy:  Heparin level 0.3-0.7 units/ml Monitor platelets by anticoagulation protocol: Yes    Date Time aPTT/HL Rate/Comment   Plan:      Give 3600 units bolus x1; then start heparin infusion at 500 units/hr (rate based on data from previous admission) Check anti-Xa level in 8 hours and daily once consecutively therapeutic. Continue to  monitor H&H and platelets daily while on heparin gtt.  Thank you for allowing pharmacy to be a part of this patient's care.  [place] Pharmacist 04/04/2022 12:03 PM

## 2022-04-04 NOTE — Assessment & Plan Note (Signed)
Change Zocor over to Lipitor.  Continue to monitor closely.  Holding Januvia for now.  Most recent hemoglobin A1c 6.5

## 2022-04-04 NOTE — Assessment & Plan Note (Addendum)
Hypotension on presentation improved.  Continue to monitor as outpatient.

## 2022-04-04 NOTE — Assessment & Plan Note (Deleted)
Watch creatinine closely with diuresis.  Creatinine 2.38 today.

## 2022-04-04 NOTE — Assessment & Plan Note (Addendum)
Medical management as per cardiology.  Patient completed 2 days of heparin drip.  Continue aspirin, Plavix and Toprol-XL.  Continue Lipitor.  LDL 117

## 2022-04-04 NOTE — Assessment & Plan Note (Signed)
With blood pressure being low this morning need to hold medications.  We will change Lasix over to oral for tomorrow.  We will try to start Toprol-XL tonight.  Holding other medications currently.

## 2022-04-04 NOTE — Progress Notes (Signed)
  Progress Note   Patient: Jordan Thornton RFF:[id number] DOB: 02-18-1930 DOA: 04/03/2022     1 DOS: the patient was seen and examined on 04/04/2022     Assessment and Plan: * Acute on chronic systolic CHF (congestive heart failure) ([place]) With blood pressure being low this morning need to hold medications.  We will change Lasix over to oral for tomorrow.  We will try to start Toprol-XL tonight.  Holding other medications currently.  NSTEMI (non-ST elevated myocardial infarction) [facility]) Medical management as per cardiology.  We will give heparin drip, aspirin Plavix and Toprol-XL if tolerated.  Continue Lipitor.  Check LDL.  Feeling better today than yesterday.  Hypotension Blood pressure too low this morning for medications.  Continue to monitor.  Change Lasix over to oral.  Try to do low-dose Toprol-XL at night.  Type 2 diabetes mellitus with hyperlipidemia (HCC) Change Zocor over to Lipitor.  Continue to monitor closely.  Holding Januvia for now.  Most recent hemoglobin A1c 6.5  CKD (chronic kidney disease), stage IV ([place]) Watch creatinine closely with diuresis.  Creatinine 2.38 today.        Subjective: Patient feeling better than yesterday.  Came in with shortness of breath and pain in her left arm.  Found to have a rising troponin with diagnosis of myocardial infarction and also heart failure.  Physical Exam: Vitals:   04/04/22 0959 04/04/22 1209 04/04/22 1400 04/04/22 1440  BP: (!) 91/58 118/82 122/73 118/76  Pulse: 70 70 68 73  Resp:  14 (!) 29 16  Temp:   98.1 F (36.7 C) 97.8 F (36.6 C)  TempSrc:   Oral   SpO2:  97% 98% 96%  Weight:      Height:       Physical Exam HENT:     Head: Normocephalic.     Mouth/Throat:     Pharynx: No oropharyngeal exudate.  Eyes:     General: Lids are normal.     Conjunctiva/sclera: Conjunctivae normal.  Cardiovascular:     Rate and Rhythm: Normal rate and regular rhythm.     Heart sounds: Normal heart sounds, S1 normal  and S2 normal.  Pulmonary:     Breath sounds: Examination of the right-lower field reveals decreased breath sounds. Examination of the left-lower field reveals decreased breath sounds. Decreased breath sounds present. No wheezing, rhonchi or rales.  Abdominal:     Palpations: Abdomen is soft.     Tenderness: There is no abdominal tenderness.  Musculoskeletal:     Right lower leg: No swelling.     Left lower leg: No swelling.  Skin:    General: Skin is warm.     Findings: No rash.  Neurological:     Mental Status: She is alert and oriented to person, place, and time.     Data Reviewed: Creatinine 2.38 troponin peaked at 1597  Family Communication: Spoke with family at the bedside  Disposition: Status is: Inpatient Remains inpatient appropriate because: Being treated for myocardial infarction and CHF exacerbation hypotension.  Planned Discharge Destination: Home    Time spent: 28 minutes  Author: Loletha Grayer, MD 04/04/2022 2:42 PM  For on call review [email or web address].

## 2022-04-04 NOTE — Progress Notes (Signed)
[facility] Cardiology    SUBJECTIVE: Patient states to feel much better weakness is resolved denies any shortness of breath improved fatigue no swelling patient states she never really had any significant chest pain and it is ready to ambulate   Vitals:   04/04/22 0630 04/04/22 0823 04/04/22 0959 04/04/22 1209  BP:  110/81 (!) 91/58 118/82  Pulse: (!) 58 64 70 70  Resp:  17  14  Temp:  97.9 F (36.6 C)    TempSrc:  Oral    SpO2: 96% 100%  97%  Weight:      Height:         Intake/Output Summary (Last 24 hours) at 04/04/2022 1245 Last data filed at 04/04/2022 4098 Gross per 24 hour  Intake --  Output 550 ml  Net -550 ml      PHYSICAL EXAM  General: Well developed, well nourished, in no acute distress HEENT:  Normocephalic and atramatic Neck:  No JVD.  Lungs: Clear bilaterally to auscultation and percussion. Heart: HRRR . Normal S1 and S2 without gallops or murmurs.  Abdomen: Bowel sounds are positive, abdomen soft and non-tender  Msk:  Back normal, normal gait. Normal strength and tone for age. Extremities: No clubbing, cyanosis or edema.   Neuro: Alert and oriented X 3. Psych:  Good affect, responds appropriately   LABS: Basic Metabolic Panel: Recent Labs    04/03/22 0651 04/04/22 0500  NA 142 142  K 4.4 4.8  CL 114* 106  CO2 22 25  GLUCOSE 159* 107*  BUN 32* 39*  CREATININE 2.10* 2.38*  CALCIUM 9.6 9.7  MG  --  1.9  PHOS  --  4.6   Liver Function Tests: Recent Labs    04/04/22 0500  ALBUMIN 3.8   No results for input(s): "LIPASE", "AMYLASE" in the last 72 hours. CBC: Recent Labs    04/03/22 0651 04/04/22 0500  WBC 10.1  10.1 7.4  NEUTROABS 7.8*  --   HGB 12.1  12.0 11.8*  HCT 36.6  37.0 36.2  MCV 79.0*  78.7* 77.8*  PLT 170  184 187   Cardiac Enzymes: No results for input(s): "CKTOTAL", "CKMB", "CKMBINDEX", "TROPONINI" in the last 72 hours. BNP: Invalid input(s): "POCBNP" D-Dimer: No results for input(s): "DDIMER" in the last 72  hours. Hemoglobin A1C: No results for input(s): "HGBA1C" in the last 72 hours. Fasting Lipid Panel: No results for input(s): "CHOL", "HDL", "LDLCALC", "TRIG", "CHOLHDL", "LDLDIRECT" in the last 72 hours. Thyroid Function Tests: No results for input(s): "TSH", "T4TOTAL", "T3FREE", "THYROIDAB" in the last 72 hours.  Invalid input(s): "FREET3" Anemia Panel: No results for input(s): "VITAMINB12", "FOLATE", "FERRITIN", "TIBC", "IRON", "RETICCTPCT" in the last 72 hours.  DG Chest 2 View  Result Date: 04/03/2022 CLINICAL DATA:  Shortness of breath EXAM: CHEST - 2 VIEW COMPARISON:  01/31/2022 FINDINGS: Interstitial coarsening with fissure thickening and a few suspected Kerley lines. Trace pleural effusion. Chronic cardiomegaly. IMPRESSION: Mild CHF. Electronically Signed   By: Jorje Guild M.D.   On: 04/03/2022 07:38     Echo pending  TELEMETRY: Telemetry strips were independently evaluated and assessed by me seems to be normal sinus rhythm occasional PVCs nonspecific ST-T wave changes rate of 80:  ASSESSMENT AND PLAN:  Principal Problem:   Acute CHF (congestive heart failure) (HCC) Non-STEMI Abnormal EKG Chronic renal insufficiency stage IV Borderline hypotension Diabetes type 2 History of hypertension Hyperlipidemia . Plan Agree with telemetry follow-up EKGs and troponins Continue aspirin statin heparin without Plavix for about 6 months  Patient has been on the beta-blocker currently on hydralazine and Imdur because of renal insufficiency History of cardiomyopathy EF around 30 to 35% we will repeat echocardiogram continue cardiomyopathy therapy has been on low-dose losartan in the past Continue Lasix we will switch to p.o. for heart failure Echocardiogram will be helpful for assessment of left ventricular function wall motion in the setting of a non-STEMI I am not in favor of invasive strategy because of significant renal insufficiency patient is pain-free currently asymptomatic  we will increase activity and continue medical therapy for now Chronic renal sufficiency stage IV recommend maintain modest hydration consider nephrology input we will try to avoid nephrotoxic drugs Agree with diabetes management and control Continue statin therapy for hyperlipidemia   Yolonda Kida, MD 04/04/2022 12:45 PM

## 2022-04-04 NOTE — ED Notes (Signed)
Pt and husband given basin, soap, toothbrush and toothpaste and clean washcloths to cleanse themselves, daughter at bedside.

## 2022-04-04 NOTE — ED Notes (Signed)
Notified NP Randol Kern of pt's trending higher trop results. Pt denies CP/SOB. Pt reports had 5x urine output since receiving lasix.   Recommendation to repeat troponin in am

## 2022-04-05 ENCOUNTER — Inpatient Hospital Stay
Admit: 2022-04-05 | Discharge: 2022-04-05 | Disposition: A | Discharge status: Home / Self Care | Payer: PPO | Attending: Cardiology | Admitting: Cardiology

## 2022-04-05 DIAGNOSIS — N184 Chronic kidney disease, stage 4 (severe): Secondary | ICD-10-CM | POA: Diagnosis not present

## 2022-04-05 DIAGNOSIS — I959 Hypotension, unspecified: Secondary | ICD-10-CM | POA: Diagnosis not present

## 2022-04-05 DIAGNOSIS — I5023 Acute on chronic systolic (congestive) heart failure: Secondary | ICD-10-CM | POA: Diagnosis not present

## 2022-04-05 DIAGNOSIS — I214 Non-ST elevation (NSTEMI) myocardial infarction: Secondary | ICD-10-CM | POA: Diagnosis not present

## 2022-04-05 LAB — GLUCOSE, CAPILLARY
Glucose-Capillary: 118 mg/dL — ABNORMAL HIGH (ref 70–99)
Glucose-Capillary: 149 mg/dL — ABNORMAL HIGH (ref 70–99)
Glucose-Capillary: 176 mg/dL — ABNORMAL HIGH (ref 70–99)
Glucose-Capillary: 126 mg/dL — ABNORMAL HIGH (ref 70–99)

## 2022-04-05 LAB — BASIC METABOLIC PANEL
Anion gap: 13 (ref 5–15)
BUN: 42 mg/dL — ABNORMAL HIGH (ref 8–23)
CO2: 26 mmol/L (ref 22–32)
Calcium: 9.7 mg/dL (ref 8.9–10.3)
Chloride: 102 mmol/L (ref 98–111)
Creatinine, Ser: 2.38 mg/dL — ABNORMAL HIGH (ref 0.44–1.00)
GFR, Estimated: 19 mL/min — ABNORMAL LOW (ref >60)
Glucose, Bld: 117 mg/dL — ABNORMAL HIGH (ref 70–99)
Potassium: 4 mmol/L (ref 3.5–5.1)
Sodium: 141 mmol/L (ref 135–145)

## 2022-04-05 LAB — CBC
HCT: 36.5 % (ref 36.0–46.0)
Hemoglobin: 12.4 g/dL (ref 12.0–15.0)
MCH: 25.6 pg — ABNORMAL LOW (ref 26.0–34.0)
MCHC: 34 g/dL (ref 30.0–36.0)
MCV: 75.4 fL — ABNORMAL LOW (ref 80.0–100.0)
Platelets: 193 10*3/uL (ref 150–400)
RBC: 4.84 MIL/uL (ref 3.87–5.11)
RDW: 14.8 % (ref 11.5–15.5)
WBC: 6.3 10*3/uL (ref 4.0–10.5)
nRBC: 0 % (ref 0.0–0.2)

## 2022-04-05 LAB — LIPID PANEL
Cholesterol: 197 mg/dL (ref 0–200)
HDL: 67 mg/dL (ref >40)
LDL Cholesterol: 117 mg/dL — ABNORMAL HIGH (ref 0–99)
Total CHOL/HDL Ratio: 2.9 RATIO
Triglycerides: 67 mg/dL (ref <150)
VLDL: 13 mg/dL (ref 0–40)

## 2022-04-05 LAB — ECHOCARDIOGRAM LIMITED
Calc EF: 35.2 %
Height: 65 in
S' Lateral: 4.53 cm
Single Plane A2C EF: 35.8 %
Single Plane A4C EF: 30.3 %
Weight: 1918.88 oz

## 2022-04-05 LAB — HEPARIN LEVEL (UNFRACTIONATED): Heparin Unfractionated: 0.38 IU/mL (ref 0.30–0.70)

## 2022-04-05 MED ORDER — LOSARTAN POTASSIUM 25 MG PO TABS
12.5000 mg | ORAL_TABLET | Freq: Every day | ORAL | Status: DC
  Administered 2022-04-05: 12.5 mg via ORAL
  Filled 2022-04-05: qty 1

## 2022-04-05 NOTE — Progress Notes (Signed)
*  PRELIMINARY RESULTS* Echocardiogram 2D Echocardiogram has been performed. A Limited Echo was requested and completed.  Claretta Fraise 04/05/2022, 6:59 PM

## 2022-04-05 NOTE — TOC Initial Note (Signed)
Transition of Care [facility]) - Initial/Assessment Note    Patient Details  Name: Jordan Thornton MRN: [id number] Date of Birth: 1930-07-16  Transition of Care [facility]) CM/SW Contact:    Beverly Sessions, RN Phone Number: 04/05/2022, 1:30 PM  Clinical Narrative:                  Admitted for: CHF/NSTEMI Admitted from: home with husband PCP: Ronnald Ramp - baseline drives herself Pharmacy:[facility] - denies issues obtaining medications  Current home health/prior home health/DME: States she has a cane at home, but doesn't have the need to use it   Patient states that her son will be picking her up at discharge Patient agreeable for home health RN for CHF.  States she does not have a preference of agency.  Referral made and accepted by Corene Cornea with [facility]          Patient Goals and CMS Choice        Expected Discharge Plan and Services                                                Prior Living Arrangements/Services                       Activities of Daily Living Home Assistive Devices/Equipment: None ADL Screening (condition at time of admission) Patient's cognitive ability adequate to safely complete daily activities?: Yes Is the patient deaf or have difficulty hearing?: No Does the patient have difficulty seeing, even when wearing glasses/contacts?: No Does the patient have difficulty concentrating, remembering, or making decisions?: No Patient able to express need for assistance with ADLs?: Yes Does the patient have difficulty dressing or bathing?: No Independently performs ADLs?: Yes (appropriate for developmental age) Does the patient have difficulty walking or climbing stairs?: No Weakness of Legs: None Weakness of Arms/Hands: None  Permission Sought/Granted                  Emotional Assessment              Admission diagnosis:  Elevated troponin [R77.8] Acute CHF (congestive heart failure) ([place]) [I50.9] Dyspnea,  unspecified type [R06.00] Patient Active Problem List   Diagnosis Date Noted   Acute on chronic systolic CHF (congestive heart failure) ([place]) 04/04/2022   NSTEMI (non-ST elevated myocardial infarction) ([place]) 04/04/2022   Hypotension 04/04/2022   Abnormal chest x-ray    Syncope 01/31/2022   Type 2 diabetes mellitus with hyperlipidemia ([place]) 01/31/2022   Depression with anxiety 01/31/2022   CKD (chronic kidney disease), stage IV ([place]) 01/31/2022   Chronic diastolic CHF (congestive heart failure) ([place]) 01/31/2022   Urinary tract infection, acute 01/31/2022   Elevated troponin 01/31/2022   H/O total hysterectomy with bilateral salpingo-oophorectomy (BSO) 10/29/2020   Anxiety 10/29/2020   Acute hypoxemic respiratory failure due to COVID-19 ([place]) 09/19/2019   Stage 3 chronic kidney disease ([place]) 09/02/2019   Diabetes mellitus with no complication ([place]) 93/81/0175   Familial multiple lipoprotein-type hyperlipidemia 01/03/2015   Creatinine elevation 01/03/2015   Diabetes ([place]) 12/15/2014   Hyperlipidemia 12/15/2014   Hypertension 12/15/2014   Gastro-esophageal reflux disease without esophagitis 12/15/2014   PCP:  Juline Patch, MD Pharmacy:   [facility] [address] [place] [address] Phone: [phone] Fax: [phone]  [place] [address] Phone: [phone] Fax: [place], [place] [address] Phone: [phone] Fax: [phone]     Social Determinants of Health (SDOH) Interventions    Readmission Risk Interventions     No data to display

## 2022-04-05 NOTE — Consult Note (Signed)
[place] for heparin Indication: chest pain/ACS  Allergies  Allergen Reactions   Sertraline     Made her more depressed    Patient Measurements: Height: 5\' 5"  (165.1 cm) Weight: 54 kg (119 lb 0.8 oz) IBW/kg (Calculated) : 57 Heparin Dosing Weight: 59.4kg  Vital Signs: Temp: 97.6 F (36.4 C) (07/22 2337) Temp Source: Oral (07/22 1400) BP: 113/72 (07/22 2337) Pulse Rate: 67 (07/22 2337)  Labs: Recent Labs    04/03/22 0651 04/03/22 0800 04/03/22 0844 04/03/22 1908 04/03/22 2012 04/04/22 0500 04/04/22 2220  HGB 12.1  12.0  --   --   --   --  11.8*  --   HCT 36.6  37.0  --   --   --   --  36.2  --   PLT 170  184  --   --   --   --  187  --   APTT  --  31  --   --   --   --   --   LABPROT  --  14.4  --   --   --   --   --   INR  --  1.1  --   --   --   --   --   HEPARINUNFRC  --   --   --   --   --   --  0.39  CREATININE 2.10*  --   --   --   --  2.38*  --   TROPONINIHS 126*  --    < > 1,297* 1,597* 1,487*  --    < > = values in this interval not displayed.     Estimated Creatinine Clearance: 13.4 mL/min (A) (by C-G formula based on SCr of 2.38 mg/dL (H)).   Medical History: Past Medical History:  Diagnosis Date   Diabetes mellitus without complication (HCC)    GERD (gastroesophageal reflux disease)    Hyperlipidemia    Hypertension     Medications:  PTA: Aspirin EC 81mg  PO QD Inpatient: Heparin infusion (7/21 >>) Allergies: No AC/APT related allergies  Assessment: with a PMH of type 2 diabetes, CKD 4, hypertension, hyperlipidemia, newly diagnosed HFrEF with LVEF 30-35%, LVH, G1 DD and mild MR 01/2022), GERD who presented to [facility] ED 04/03/2022 with shortness of breath.   Goal of Therapy:  Heparin level 0.3-0.7 units/ml Monitor platelets by anticoagulation protocol: Yes    Date Time aPTT/HL Rate/Comment 0722 2220 0.39  therapeutic  Plan:      Continue heparin infusion at 500 units/hr (rate based on data from  previous admission) Check confirmatory anti-Xa level in 8 hours and daily once consecutively therapeutic. Continue to monitor H&H and platelets daily while on heparin gtt.  Thank you for allowing pharmacy to be a part of this patient's care.  Alvie Fowles A Clinical Pharmacist 04/05/2022 12:57 AM

## 2022-04-05 NOTE — Progress Notes (Signed)
[facility] Cardiology    SUBJECTIVE: Patient feels reasonably well no shortness of breath no chest pain ambulating adequately no leg edema feels relatively comfortable   Vitals:   04/05/22 0411 04/05/22 0500 04/05/22 0727 04/05/22 1222  BP: 118/67  117/72 128/76  Pulse: 62  71 75  Resp: 16  15 15   Temp: 97.8 F (36.6 C)  (!) 97.4 F (36.3 C) 97.8 F (36.6 C)  TempSrc:      SpO2: 95%  97% 97%  Weight:  54.4 kg    Height:         Intake/Output Summary (Last 24 hours) at 04/05/2022 1536 Last data filed at 04/05/2022 1500 Gross per 24 hour  Intake 374.67 ml  Output 0 ml  Net 374.67 ml      PHYSICAL EXAM  General: Well developed, well nourished, in no acute distress HEENT:  Normocephalic and atramatic Neck:  No JVD.  Lungs: Clear bilaterally to auscultation and percussion. Heart: HRRR . Normal S1 and S2 without gallops or murmurs.  Abdomen: Bowel sounds are positive, abdomen soft and non-tender  Msk:  Back normal, normal gait. Normal strength and tone for age. Extremities: No clubbing, cyanosis or edema.   Neuro: Alert and oriented X 3. Psych:  Good affect, responds appropriately   LABS: Basic Metabolic Panel: Recent Labs    04/04/22 0500 04/05/22 0606  NA 142 141  K 4.8 4.0  CL 106 102  CO2 25 26  GLUCOSE 107* 117*  BUN 39* 42*  CREATININE 2.38* 2.38*  CALCIUM 9.7 9.7  MG 1.9  --   PHOS 4.6  --    Liver Function Tests: Recent Labs    04/04/22 0500  ALBUMIN 3.8   No results for input(s): "LIPASE", "AMYLASE" in the last 72 hours. CBC: Recent Labs    04/03/22 0651 04/04/22 0500 04/05/22 0606  WBC 10.1  10.1 7.4 6.3  NEUTROABS 7.8*  --   --   HGB 12.1  12.0 11.8* 12.4  HCT 36.6  37.0 36.2 36.5  MCV 79.0*  78.7* 77.8* 75.4*  PLT 170  184 187 193   Cardiac Enzymes: No results for input(s): "CKTOTAL", "CKMB", "CKMBINDEX", "TROPONINI" in the last 72 hours. BNP: Invalid input(s): "POCBNP" D-Dimer: No results for input(s): "DDIMER" in the last 72  hours. Hemoglobin A1C: No results for input(s): "HGBA1C" in the last 72 hours. Fasting Lipid Panel: Recent Labs    04/05/22 0606  CHOL 197  HDL 67  LDLCALC 117*  TRIG 67  CHOLHDL 2.9   Thyroid Function Tests: No results for input(s): "TSH", "T4TOTAL", "T3FREE", "THYROIDAB" in the last 72 hours.  Invalid input(s): "FREET3" Anemia Panel: No results for input(s): "VITAMINB12", "FOLATE", "FERRITIN", "TIBC", "IRON", "RETICCTPCT" in the last 72 hours.  No results found.   Echo echocardiogram suggested reduced left ventricular function globally around 30 to 35%  TELEMETRY: Telemetry interpreted by me shows normal sinus rhythm nonspecific ST-T changes rate of 80:  ASSESSMENT AND PLAN:  Principal Problem:   NSTEMI (non-ST elevated myocardial infarction) ([place]) Active Problems:   Type 2 diabetes mellitus with hyperlipidemia (HCC)   CKD (chronic kidney disease), stage IV (HCC)   Acute on chronic systolic CHF (congestive heart failure) (HCC)   Hypotension    1 increase activity from non-STEMI foot generalized weakness have the patient ambulate hopefully should be ready to be discharged within the next 24 hours Recommend discontinue heparin either today or tomorrow prior to discharge Continue diabetes management and control Maintain renal insufficiency management  follow-up with [facility] as much heart failure medicines as possible with not increase ACE or ARB or Bertell Maria will probably defer to Imdur hydralazine because of renal insufficiency Continue diuretic therapy as an outpatient to help with heart failure Would consider AICD recommendation if heart function remains under 35% We will treat non-STEMI medically for now and follow-up as an outpatient invasive strategies not optimal because of her renal insufficiency patient is relatively stable and asymptomatic at this point   Yolonda Kida, MD 04/05/2022 3:36 PM

## 2022-04-05 NOTE — Consult Note (Signed)
ANTICOAGULATION CONSULT NOTE  Pharmacy Consult for heparin Indication: chest pain/ACS  Allergies  Allergen Reactions   Sertraline     Made her more depressed    Patient Measurements: Height: 5\' 5"  (165.1 cm) Weight: 54.4 kg (119 lb 14.9 oz) IBW/kg (Calculated) : 57 Heparin Dosing Weight: 59.4kg  Vital Signs: Temp: 97.4 F (36.3 C) (07/23 0727) BP: 117/72 (07/23 0727) Pulse Rate: 71 (07/23 0727)  Labs: Recent Labs    04/03/22 0651 04/03/22 0800 04/03/22 0844 04/03/22 1908 04/03/22 2012 04/04/22 0500 04/04/22 2220 04/05/22 0606  HGB 12.1  12.0  --   --   --   --  11.8*  --  12.4  HCT 36.6  37.0  --   --   --   --  36.2  --  36.5  PLT 170  184  --   --   --   --  187  --  193  APTT  --  31  --   --   --   --   --   --   LABPROT  --  14.4  --   --   --   --   --   --   INR  --  1.1  --   --   --   --   --   --   HEPARINUNFRC  --   --   --   --   --   --  0.39 0.38  CREATININE 2.10*  --   --   --   --  2.38*  --  2.38*  TROPONINIHS 126*  --    < > 1,297* 1,597* 1,487*  --   --    < > = values in this interval not displayed.     Estimated Creatinine Clearance: 13.5 mL/min (A) (by C-G formula based on SCr of 2.38 mg/dL (H)).   Medical History: Past Medical History:  Diagnosis Date   Diabetes mellitus without complication (HCC)    GERD (gastroesophageal reflux disease)    Hyperlipidemia    Hypertension     Medications:  PTA: Aspirin EC 81mg  PO QD Inpatient: Heparin infusion (7/21 >>) Allergies: No AC/APT related allergies  Assessment: with a PMH of type 2 diabetes, CKD 4, hypertension, hyperlipidemia, newly diagnosed HFrEF with LVEF 30-35%, LVH, G1 DD and mild MR 01/2022), GERD who presented to [facility] ED 04/03/2022 with shortness of breath.   Goal of Therapy:  Heparin level 0.3-0.7 units/ml Monitor platelets by anticoagulation protocol: Yes    Date Time aPTT/HL Rate/Comment 0722 2220 0.39  Therapeutic x1;  0723 0606 0.38  Therapeutic x2; 500 un/hr    Plan:      HL consecutively therapeutic; will switch to daily monitoring.  **NOTE, it appears that the drip was paused many times on the [facility] 7/22-7/23. From my review seemingly was off from (7/23 0055 > 7/23 0214) so true HL is likely slightly higher.  Continue heparin infusion at 500 units/hr (rate based on data from previous admission) Check HL daily since consecutively therapeutic. Continue to monitor H&H and platelets daily while on heparin gtt.  Thank you for allowing pharmacy to be a part of this patient's care.  Lorna Dibble Clinical Pharmacist 04/05/2022 8:04 AM

## 2022-04-05 NOTE — Progress Notes (Signed)
  Progress Note   Patient: Jordan Thornton RAX:[id number] DOB: 24-Jul-1930 DOA: 04/03/2022     2 DOS: the patient was seen and examined on 04/05/2022     Assessment and Plan: * NSTEMI (non-ST elevated myocardial infarction) [facility]) Medical management as per cardiology.  Continue heparin drip until tomorrow morning.  Continue aspirin, Plavix and Toprol-XL.  Continue Lipitor.  LDL 117  Acute on chronic systolic CHF (congestive heart failure) (HCC) Continue oral Lasix, restart low-dose losartan.  Continue Toprol low-dose.  Last EF 30 to 35%.  Hypotension Blood pressure improved from yesterday.  Type 2 diabetes mellitus with hyperlipidemia (HCC) Change Zocor over to Lipitor.  Continue to monitor closely.  Holding Januvia for now.  Most recent hemoglobin A1c 6.5  CKD (chronic kidney disease), stage IV ([place]) Watch creatinine closely with diuresis.  Creatinine 2.38 today.        Subjective: Patient feels well today.  Offers no complaints of shortness of breath or chest pain.  Admitted with CHF exacerbation and NSTEMI.  Physical Exam: Vitals:   04/05/22 0411 04/05/22 0500 04/05/22 0727 04/05/22 1222  BP: 118/67  117/72 128/76  Pulse: 62  71 75  Resp: 16  15 15   Temp: 97.8 F (36.6 C)  (!) 97.4 F (36.3 C) 97.8 F (36.6 C)  TempSrc:      SpO2: 95%  97% 97%  Weight:  54.4 kg    Height:       Physical Exam HENT:     Head: Normocephalic.     Mouth/Throat:     Pharynx: No oropharyngeal exudate.  Eyes:     General: Lids are normal.     Conjunctiva/sclera: Conjunctivae normal.  Cardiovascular:     Rate and Rhythm: Normal rate and regular rhythm.     Heart sounds: Normal heart sounds, S1 normal and S2 normal.  Pulmonary:     Breath sounds: Examination of the right-lower field reveals decreased breath sounds. Examination of the left-lower field reveals decreased breath sounds. Decreased breath sounds present. No wheezing, rhonchi or rales.  Abdominal:     Palpations: Abdomen  is soft.     Tenderness: There is no abdominal tenderness.  Musculoskeletal:     Right lower leg: No swelling.     Left lower leg: No swelling.  Skin:    General: Skin is warm.     Findings: No rash.  Neurological:     Mental Status: She is alert and oriented to person, place, and time.     Data Reviewed: Creatinine 2.38  Family Communication: Spoke with husband at the bedside  Disposition: Status is: Inpatient Remains inpatient appropriate because: Continuing heparin drip today.  Likely home tomorrow.  Medical management for CHF and NSTEMI.  Planned Discharge Destination: Home    Time spent: 27 minutes Case discussed with cardiology Author: Loletha Grayer, MD 04/05/2022 2:27 PM  For on call review [email or web address].

## 2022-04-06 DIAGNOSIS — N179 Acute kidney failure, unspecified: Secondary | ICD-10-CM | POA: Diagnosis not present

## 2022-04-06 DIAGNOSIS — I5023 Acute on chronic systolic (congestive) heart failure: Secondary | ICD-10-CM | POA: Diagnosis not present

## 2022-04-06 DIAGNOSIS — I959 Hypotension, unspecified: Secondary | ICD-10-CM | POA: Diagnosis not present

## 2022-04-06 DIAGNOSIS — N189 Chronic kidney disease, unspecified: Secondary | ICD-10-CM

## 2022-04-06 DIAGNOSIS — I214 Non-ST elevation (NSTEMI) myocardial infarction: Secondary | ICD-10-CM | POA: Diagnosis not present

## 2022-04-06 LAB — GLUCOSE, CAPILLARY: Glucose-Capillary: 126 mg/dL — ABNORMAL HIGH (ref 70–99)

## 2022-04-06 LAB — BASIC METABOLIC PANEL
Anion gap: 11 (ref 5–15)
BUN: 45 mg/dL — ABNORMAL HIGH (ref 8–23)
CO2: 26 mmol/L (ref 22–32)
Calcium: 9.5 mg/dL (ref 8.9–10.3)
Chloride: 103 mmol/L (ref 98–111)
Creatinine, Ser: 2.53 mg/dL — ABNORMAL HIGH (ref 0.44–1.00)
GFR, Estimated: 18 mL/min — ABNORMAL LOW (ref >60)
Glucose, Bld: 119 mg/dL — ABNORMAL HIGH (ref 70–99)
Potassium: 4 mmol/L (ref 3.5–5.1)
Sodium: 140 mmol/L (ref 135–145)

## 2022-04-06 LAB — CBC
HCT: 38.1 % (ref 36.0–46.0)
Hemoglobin: 12.8 g/dL (ref 12.0–15.0)
MCH: 25.9 pg — ABNORMAL LOW (ref 26.0–34.0)
MCHC: 33.6 g/dL (ref 30.0–36.0)
MCV: 77 fL — ABNORMAL LOW (ref 80.0–100.0)
Platelets: 192 10*3/uL (ref 150–400)
RBC: 4.95 MIL/uL (ref 3.87–5.11)
RDW: 14.7 % (ref 11.5–15.5)
WBC: 6 10*3/uL (ref 4.0–10.5)
nRBC: 0 % (ref 0.0–0.2)

## 2022-04-06 LAB — HEPARIN LEVEL (UNFRACTIONATED): Heparin Unfractionated: 0.42 IU/mL (ref 0.30–0.70)

## 2022-04-06 MED ORDER — METOPROLOL SUCCINATE ER 25 MG PO TB24: 12.5000 mg | ORAL_TABLET | Freq: Every day | ORAL | 0 refills | Status: DC

## 2022-04-06 MED ORDER — FUROSEMIDE 20 MG PO TABS: 20.0000 mg | ORAL_TABLET | Freq: Every day | ORAL | 0 refills | Status: DC

## 2022-04-06 MED ORDER — CLOPIDOGREL BISULFATE 75 MG PO TABS: 75.0000 mg | ORAL_TABLET | Freq: Every day | ORAL | 0 refills | Status: DC

## 2022-04-06 MED ORDER — ATORVASTATIN CALCIUM 40 MG PO TABS: 40.0000 mg | ORAL_TABLET | Freq: Every evening | ORAL | 0 refills | Status: DC

## 2022-04-06 NOTE — Assessment & Plan Note (Addendum)
Acute kidney injury on CKD stage IV.  Baseline creatinine around 2.11.  Creatinine upon discharge 2.53.  Holding losartan at this time.  Recommend checking a BMP and follow-up appointment.  Patient states that she will be better at home.

## 2022-04-06 NOTE — Care Management Important Message (Signed)
Important Message  Patient Details  Name: Jordan Thornton MRN: [id number] Date of Birth: [birth year]   Medicare Important Message Given:  N/A - LOS <3 / Initial given by admissions     Dannette Barbara 04/06/2022, 9:41 AM

## 2022-04-06 NOTE — Consult Note (Signed)
ANTICOAGULATION CONSULT NOTE  Pharmacy Consult for heparin Indication: chest pain/ACS  Allergies  Allergen Reactions   Sertraline     Made her more depressed    Patient Measurements: Height: 5\' 5"  (165.1 cm) Weight: 51.4 kg (113 lb 5.1 oz) IBW/kg (Calculated) : 57 Heparin Dosing Weight: 59.4kg  Vital Signs: Temp: 98.2 F (36.8 C) (07/24 0711) BP: 118/81 (07/24 0711) Pulse Rate: 65 (07/24 0711)  Labs: Recent Labs    04/03/22 0800 04/03/22 0844 04/03/22 1908 04/03/22 2012 04/04/22 0500 04/04/22 0500 04/04/22 2220 04/05/22 0606 04/06/22 0613  HGB  --   --   --   --  11.8*   < >  --  12.4 12.8  HCT  --   --   --   --  36.2  --   --  36.5 38.1  PLT  --   --   --   --  187  --   --  193 192  APTT 31  --   --   --   --   --   --   --   --   LABPROT 14.4  --   --   --   --   --   --   --   --   INR 1.1  --   --   --   --   --   --   --   --   HEPARINUNFRC  --   --   --   --   --   --  0.39 0.38 0.42  CREATININE  --   --   --   --  2.38*  --   --  2.38* 2.53*  TROPONINIHS  --    < > 1,297* 1,597* 1,487*  --   --   --   --    < > = values in this interval not displayed.     Estimated Creatinine Clearance: 12 mL/min (A) (by C-G formula based on SCr of 2.53 mg/dL (H)).   Medical History: Past Medical History:  Diagnosis Date   Diabetes mellitus without complication (HCC)    GERD (gastroesophageal reflux disease)    Hyperlipidemia    Hypertension     Medications:  PTA: Aspirin EC 81mg  PO QD Inpatient: Heparin infusion (7/21 >>) Allergies: No AC/APT related allergies  Assessment: with a PMH of type 2 diabetes, CKD 4, hypertension, hyperlipidemia, newly diagnosed HFrEF with LVEF 30-35%, LVH, G1 DD and mild MR 01/2022), GERD who presented to [facility] ED 04/03/2022 with shortness of breath.   Goal of Therapy:  Heparin level 0.3-0.7 units/ml Monitor platelets by anticoagulation protocol: Yes    Date Time aPTT/HL Rate/Comment 0722 2220 0.39  Therapeutic x1;   0723 0606 0.38  Therapeutic x2; 500 un/hr  0724 8182 0.42  Therapeutic   Plan:      HL consecutively therapeutic; will switch to daily monitoring.  **NOTE, it appears that the drip was paused many times on the [facility] 7/22-7/23. From my review seemingly was off from (7/23 0055 > 7/23 0214) so true HL is likely slightly higher.  Heparin remains therapeutic Continue heparin infusion at 500 units/hr Check HL daily since consecutively therapeutic. Continue to monitor H&H and platelets daily while on heparin gtt.  Thank you for allowing pharmacy to be a part of this patient's care.  Dorothe Pea, PharmD, BCPS Clinical Pharmacist   04/06/2022 7:29 AM

## 2022-04-06 NOTE — Progress Notes (Signed)
Patient alert and oriented, vss, no complaints of pain.  D/c telemetry and piv.  No questions at this time.  Son present at discharge.  Escorted out of hospital via wheelchair by nursing staff.

## 2022-04-06 NOTE — Discharge Summary (Signed)
Physician Discharge Summary   Patient: Jordan Thornton MRN: [id number] DOB: 10-11-1929  Admit date:     04/03/2022  Discharge date: 04/06/22  Discharge Physician: Loletha Grayer   PCP: Juline Patch, MD   Recommendations at discharge:   Follow-up PCP 5 days Follow-up cardiology 1 week Follow-up heart failure clinic and cardiac rehab  Discharge Diagnoses: Principal Problem:   NSTEMI (non-ST elevated myocardial infarction) [facility]) Active Problems:   Acute on chronic systolic CHF (congestive heart failure) ([place])   Acute kidney injury superimposed on CKD ([place])   Hypotension   Type 2 diabetes mellitus with hyperlipidemia [facility])   Hospital Course: Patient was admitted to the hospital on 04/03/2022 and discharged on 04/06/2022.  The patient was mopping at home and developed shortness of breath and arm pain and came into the hospital.  She was diagnosed with acute on chronic systolic congestive heart failure and diuresed with IV Lasix.  Her troponins rose and peaked at 1597.  With her chronic kidney disease it was decided to do medical management.  She was on heparin drip for 2 days.  Patient was on aspirin, Plavix and Toprol were added.  Her Zocor was switched over to Lipitor.  Her LDL was 117.  Patient felt well at the time of discharge and discharged home in stable condition with follow-up with CHF clinic and cardiac rehab.  Her creatinine went up to 2.53.  Her losartan was held.  Lasix was held on the day of discharge and will be resumed at 20 mg daily upon discharge.  Recommend checking a BMP and follow-up appointment.  Assessment and Plan: * NSTEMI (non-ST elevated myocardial infarction) [facility]) Medical management as per cardiology.  Patient completed 2 days of heparin drip.  Continue aspirin, Plavix and Toprol-XL.  Continue Lipitor.  LDL 117  Acute on chronic systolic CHF (congestive heart failure) (HCC) Hold oral Lasix today and restart low-dose as outpatient.  Continue Toprol  low-dose.  Last EF 30 to 35%.  Holding losartan with acute kidney injury.  No spironolactone with acute kidney injury.  Farxiga contraindicated with chronic kidney disease.  Acute kidney injury superimposed on CKD ([place]) Acute kidney injury on CKD stage IV.  Baseline creatinine around 2.11.  Creatinine upon discharge 2.53.  Holding losartan at this time.  Recommend checking a BMP and follow-up appointment.  Patient states that she will be better at home.  Hypotension Hypotension on presentation improved.  Continue to monitor as outpatient.  Type 2 diabetes mellitus with hyperlipidemia (HCC) Change Zocor over to Lipitor.  Continue to monitor closely.  Holding Januvia for now.  Most recent hemoglobin A1c 6.5.  Can go back on Januvia as outpatient         Consultants: Cardiology Procedures performed: None Disposition: Home health Diet recommendation:  Cardiac and Carb modified diet DISCHARGE MEDICATION: Allergies as of 04/06/2022       Reactions   Sertraline    Made her more depressed        Medication List     STOP taking these medications    busPIRone 7.5 MG tablet Commonly known as: BUSPAR   losartan 25 MG tablet Commonly known as: COZAAR   simvastatin 20 MG tablet Commonly known as: ZOCOR   Vitamin D 50 MCG (2000 UT) Caps       TAKE these medications    Advocate Lancets Misc use once daily as directed to test blood sugar   Advocate Lancing Device Misc use once daily as directed to test  blood sugar   aspirin EC 81 MG tablet Take 1 tablet (81 mg total) by mouth daily. Swallow whole.   atorvastatin 40 MG tablet Commonly known as: LIPITOR Take 1 tablet (40 mg total) by mouth every evening.   clopidogrel 75 MG tablet Commonly known as: PLAVIX Take 1 tablet (75 mg total) by mouth daily. Start taking on: April 07, 2022   Elderberry 575 MG/5ML Syrp Take 1 Dose by mouth daily.   Fish Oil 1000 MG Caps Take 1 capsule (1,000 mg total) by mouth daily.    FreeStyle [employer] use once daily as directed to test blood sugar   FREESTYLE LITE test strip Generic drug: glucose blood USE 1 STRIP TO CHECK GLUCOSE ONCE DAILY AS DIRECTED   furosemide 20 MG tablet Commonly known as: Lasix Take 1 tablet (20 mg total) by mouth daily.   Januvia 25 MG tablet Generic drug: sitaGLIPtin Take 1 tablet (25 mg total) by mouth daily.   lansoprazole 15 MG capsule Commonly known as: PREVACID Take 1 capsule by mouth once daily What changed:  when to take this additional instructions   LORazepam 0.5 MG tablet Commonly known as: ATIVAN Take 1 tablet (0.5 mg total) by mouth as needed. Per 30 days   metoprolol succinate 25 MG 24 hr tablet Commonly known as: TOPROL-XL Take 0.5 tablets (12.5 mg total) by mouth at bedtime.   TYLENOL ARTHRITIS PAIN PO Take 2 tablets by mouth daily as needed.        Follow-up Information     Juline Patch, MD. Go on 04/10/2022.   Specialty: Family Medicine Why: Appointment on Friday 04/10/2022 at 2:40pm. Contact information: [address] [place] [address] [phone]         Yolonda Kida, MD. Go on 04/30/2022.   Specialties: Cardiology, Internal Medicine Why: Appointment on Thursday 04/30/2022 at 3pm. Please call often for an earlier appointment. Contact information: [place] [address] [phone]                Discharge Exam: Danley Danker Weights   04/04/22 1440 04/05/22 0500 04/06/22 0446  Weight: 54 kg 54.4 kg 51.4 kg   Physical Exam HENT:     Head: Normocephalic.     Mouth/Throat:     Pharynx: No oropharyngeal exudate.  Eyes:     General: Lids are normal.     Conjunctiva/sclera: Conjunctivae normal.  Cardiovascular:     Rate and Rhythm: Normal rate and regular rhythm.     Heart sounds: Normal heart sounds, S1 normal and S2 normal.  Pulmonary:     Breath sounds: Examination of the right-lower field reveals decreased breath sounds.  Examination of the left-lower field reveals decreased breath sounds. Decreased breath sounds present. No wheezing, rhonchi or rales.  Abdominal:     Palpations: Abdomen is soft.     Tenderness: There is no abdominal tenderness.  Musculoskeletal:     Right lower leg: No swelling.     Left lower leg: No swelling.  Skin:    General: Skin is warm.     Findings: No rash.  Neurological:     Mental Status: She is alert and oriented to person, place, and time.      Condition at discharge: stable  The results of significant diagnostics from this hospitalization (including imaging, microbiology, ancillary and laboratory) are listed below for reference.   Imaging Studies: ECHOCARDIOGRAM LIMITED  Result Date: 04/05/2022    ECHOCARDIOGRAM LIMITED REPORT  Patient Name:   Jordan MCCOY Laing Date of Exam: 04/05/2022 Medical Rec #:  [id number]              Height:       65.0 in Accession #:    [id number]             Weight:       119.9 lb Date of Birth:  [birth year]               BSA:          1.592 m Patient Age:    88 years               BP:           128/76 mmHg Patient Gender: F                      HR:           76 bpm. Exam Location:  [facility] Procedure: Limited Echo Indications:     CHF I50.21  History:         Patient has prior history of Echocardiogram examinations, most                  recent 02/01/2022.  Sonographer:     Kathlen Brunswick RDCS Referring Phys:  [id number] [place] TANG Diagnosing Phys: Yolonda Kida MD IMPRESSIONS  1. Left ventricular ejection fraction, by estimation, is 30 to 35%. The left ventricle has moderately decreased function. The left ventricle demonstrates global hypokinesis. The left ventricular internal cavity size was moderately dilated. Left ventricular diastolic function could not be evaluated.  2. The right ventricular size is normal. Mildly increased right ventricular wall thickness.  3. The mitral valve is normal in structure. Mild mitral valve regurgitation.  4.  The aortic valve is grossly normal. Aortic valve regurgitation is not visualized. Aortic valve sclerosis/calcification is present, without any evidence of aortic stenosis. Conclusion(s)/Recommendation(s): Poor windows for evaluation of left ventricular function by transthoracic echocardiography. Would recommend an alternative means of evaluation. FINDINGS  Left Ventricle: Left ventricular ejection fraction, by estimation, is 30 to 35%. The left ventricle has moderately decreased function. The left ventricle demonstrates global hypokinesis. The left ventricular internal cavity size was moderately dilated. There is borderline left ventricular hypertrophy. Left ventricular diastolic function could not be evaluated. Right Ventricle: The right ventricular size is normal. Mildly increased right ventricular wall thickness. Left Atrium: Left atrial size was normal in size. Right Atrium: Right atrial size was normal in size. Mitral Valve: The mitral valve is normal in structure. There is mild thickening of the mitral valve leaflet(s). There is mild calcification of the mitral valve leaflet(s). Normal mobility of the mitral valve leaflets. Mild mitral valve regurgitation. Tricuspid Valve: The tricuspid valve is normal in structure. Tricuspid valve regurgitation is trivial. Aortic Valve: The aortic valve is grossly normal. Aortic valve regurgitation is not visualized. Aortic valve sclerosis/calcification is present, without any evidence of aortic stenosis. Pulmonic Valve: The pulmonic valve was normal in structure. Pulmonic valve regurgitation is not visualized. Aorta: The ascending aorta was not well visualized. IAS/Shunts: No atrial level shunt detected by color flow Doppler. LEFT VENTRICLE PLAX 2D LVIDd:         5.35 cm LVIDs:         4.53 cm LV PW:         1.26 cm LV IVS:        0.94 cm  LV Volumes (MOD)  LV vol d, MOD A2C: 159.0 ml LV vol d, MOD A4C: 119.0 ml LV vol s, MOD A2C: 102.0 ml LV vol s, MOD A4C: 82.9 ml LV SV MOD  A2C:     57.0 ml LV SV MOD A4C:     119.0 ml LV SV MOD BP:      50.6 ml LEFT ATRIUM         Index LA diam:    2.80 cm 1.76 cm/m Yolonda Kida MD Electronically signed by Yolonda Kida MD Signature Date/Time: 04/05/2022/10:59:01 PM    Final    DG Chest 2 View  Result Date: 04/03/2022 CLINICAL DATA:  Shortness of breath EXAM: CHEST - 2 VIEW COMPARISON:  01/31/2022 FINDINGS: Interstitial coarsening with fissure thickening and a few suspected Kerley lines. Trace pleural effusion. Chronic cardiomegaly. IMPRESSION: Mild CHF. Electronically Signed   By: Jorje Guild M.D.   On: 04/03/2022 07:38      Labs: CBC: Recent Labs  Lab 04/03/22 0651 04/04/22 0500 04/05/22 0606 04/06/22 0613  WBC 10.1  10.1 7.4 6.3 6.0  NEUTROABS 7.8*  --   --   --   HGB 12.1  12.0 11.8* 12.4 12.8  HCT 36.6  37.0 36.2 36.5 38.1  MCV 79.0*  78.7* 77.8* 75.4* 77.0*  PLT 170  184 187 193 836   Basic Metabolic Panel: Recent Labs  Lab 04/03/22 0651 04/04/22 0500 04/05/22 0606 04/06/22 0613  NA 142 142 141 140  K 4.4 4.8 4.0 4.0  CL 114* 106 102 103  CO2 22 25 26 26   GLUCOSE 159* 107* 117* 119*  BUN 32* 39* 42* 45*  CREATININE 2.10* 2.38* 2.38* 2.53*  CALCIUM 9.6 9.7 9.7 9.5  MG  --  1.9  --   --   PHOS  --  4.6  --   --    Liver Function Tests: Recent Labs  Lab 04/04/22 0500  ALBUMIN 3.8   CBG: Recent Labs  Lab 04/05/22 0726 04/05/22 1222 04/05/22 1729 04/05/22 2111 04/06/22 0713  GLUCAP 118* 149* 176* 126* 126*    Discharge time spent: greater than 30 minutes.  Signed: Loletha Grayer, MD [facility] 04/06/2022

## 2022-04-06 NOTE — Discharge Instructions (Addendum)
We stopped your simvastatin and started atorvastatin  Check bmp in follow up appointment to see if we can restart losartan

## 2022-04-06 NOTE — TOC Transition Note (Signed)
Transition of Care [facility]) - CM/SW Discharge Note   Patient Details  Name: Jordan Thornton MRN: [id number] Date of Birth: 05/02/1930  [facility]) CM/SW Contact:  Laurena Slimmer, RN Phone Number: 04/06/2022, 10:35 AM   Clinical Narrative:    Spoke with patient regarding discharge.  Patient son will pick her up. Denied any other questions or concerns. TOC signing off.          Patient Goals and CMS Choice        Discharge Placement                       Discharge Plan and Services                                     Social Determinants of Health (SDOH) Interventions     Readmission Risk Interventions     No data to display

## 2022-04-07 ENCOUNTER — Other Ambulatory Visit: Payer: Self-pay | Admitting: *Deleted

## 2022-04-07 NOTE — Patient Outreach (Signed)
  Care Coordination [facility] Note Transition Care Management Follow-up Telephone Call Date of discharge and from where: [phone] from [facility] How have you been since you were released from the hospital? I feel good and still a little week.  Any questions or concerns? No  Items Reviewed: Did the pt receive and understand the discharge instructions provided? Yes  Medications obtained and verified? Yes  Other? No  Any new allergies since your discharge? No  Dietary orders reviewed? No Do you have support at home? Yes   Home Care and Equipment/Supplies: Were home health services ordered? not applicable If so, what is the name of the agency? N  Has the agency set up a time to come to the patient's home? not applicable Were any new equipment or medical supplies ordered?  No What is the name of the medical supply agency? N  Were you able to get the supplies/equipment? not applicable Do you have any questions related to the use of the equipment or supplies? No  Functional Questionnaire: (I = Independent and D = Dependent) ADLs: I  Bathing/Dressing- I  Meal Prep- I  Eating- I  Maintaining continence- I  Transferring/Ambulation- I  Managing Meds- I  Follow up appointments reviewed:  PCP Hospital f/u appt confirmed? Yes  Scheduled to see [phone] at 2:40 pm [facility] f/u appt confirmed? Kathrynn Speed  NP [phone] 11:30  / Lujean Amel [phone] at 3 pm  Are transportation arrangements needed? n If their condition worsens, is the pt aware to call PCP or go to the Emergency Dept.? Y Was the patient provided with contact information for the PCP's office or ED? Yes Was to pt encouraged to call back with questions or concerns? Yes  SDOH assessments and interventions completed:   Yes  Care Coordination Interventions Activated:  No Care Coordination Interventions:   n/a  Encounter Outcome:  Pt. Visit Completed

## 2022-04-10 ENCOUNTER — Encounter: Payer: Self-pay | Admitting: Family Medicine

## 2022-04-10 ENCOUNTER — Ambulatory Visit (INDEPENDENT_AMBULATORY_CARE_PROVIDER_SITE_OTHER): Payer: PPO | Admitting: Family Medicine

## 2022-04-10 ENCOUNTER — Other Ambulatory Visit
Admission: RE | Admit: 2022-04-10 | Discharge: 2022-04-10 | Disposition: A | Discharge status: Home / Self Care | Payer: PPO | Attending: Family Medicine | Admitting: Family Medicine

## 2022-04-10 VITALS — BP 120/80 | HR 60 | Ht 65.0 in | Wt 120.0 lb

## 2022-04-10 DIAGNOSIS — I2510 Atherosclerotic heart disease of native coronary artery without angina pectoris: Secondary | ICD-10-CM

## 2022-04-10 DIAGNOSIS — I10 Essential (primary) hypertension: Secondary | ICD-10-CM

## 2022-04-10 DIAGNOSIS — I5032 Chronic diastolic (congestive) heart failure: Secondary | ICD-10-CM | POA: Diagnosis not present

## 2022-04-10 DIAGNOSIS — I110 Hypertensive heart disease with heart failure: Secondary | ICD-10-CM | POA: Insufficient documentation

## 2022-04-10 DIAGNOSIS — E119 Type 2 diabetes mellitus without complications: Secondary | ICD-10-CM

## 2022-04-10 LAB — RENAL FUNCTION PANEL
Albumin: 4.4 g/dL (ref 3.5–5.0)
Anion gap: 9 (ref 5–15)
BUN: 53 mg/dL — ABNORMAL HIGH (ref 8–23)
CO2: 25 mmol/L (ref 22–32)
Calcium: 9.6 mg/dL (ref 8.9–10.3)
Chloride: 102 mmol/L (ref 98–111)
Creatinine, Ser: 2.7 mg/dL — ABNORMAL HIGH (ref 0.44–1.00)
GFR, Estimated: 16 mL/min — ABNORMAL LOW (ref >60)
Glucose, Bld: 172 mg/dL — ABNORMAL HIGH (ref 70–99)
Phosphorus: 4.1 mg/dL (ref 2.5–4.6)
Potassium: 4.5 mmol/L (ref 3.5–5.1)
Sodium: 136 mmol/L (ref 135–145)

## 2022-04-10 NOTE — Progress Notes (Signed)
Date:  04/10/2022   Name:  Jordan Thornton   DOB:  September 21, 1929   MRN:  [id number]   Chief Complaint: Follow-up (Non- stemi, admitted on the 21st and d/c on 04/06/22. )  Patient is a 88 year old female who presents for a hospital discharge exam. The patient reports the following problems: none. Health maintenance has been reviewed up to date.      Lab Results  Component Value Date   NA 140 04/06/2022   K 4.0 04/06/2022   CO2 26 04/06/2022   GLUCOSE 119 (H) 04/06/2022   BUN 45 (H) 04/06/2022   CREATININE 2.53 (H) 04/06/2022   CALCIUM 9.5 04/06/2022   EGFR 27 (L) 07/03/2021   GFRNONAA 18 (L) 04/06/2022   Lab Results  Component Value Date   CHOL 197 04/05/2022   HDL 67 04/05/2022   LDLCALC 117 (H) 04/05/2022   TRIG 67 04/05/2022   CHOLHDL 2.9 04/05/2022   No results found for: "TSH" Lab Results  Component Value Date   HGBA1C 6.5 (H) 01/31/2022   Lab Results  Component Value Date   WBC 6.0 04/06/2022   HGB 12.8 04/06/2022   HCT 38.1 04/06/2022   MCV 77.0 (L) 04/06/2022   PLT 192 04/06/2022   Lab Results  Component Value Date   ALT 15 01/31/2022   AST 19 01/31/2022   ALKPHOS 53 01/31/2022   BILITOT 1.2 01/31/2022   No results found for: "25OHVITD2", "25OHVITD3", "VD25OH"   Review of Systems  Constitutional:  Negative for chills and fever.  HENT:  Negative for ear pain and sore throat.   Respiratory:  Negative for cough, shortness of breath and wheezing.   Cardiovascular:  Negative for chest pain, palpitations and leg swelling.  Gastrointestinal:  Negative for abdominal pain, blood in stool and nausea.  Endocrine: Negative for polydipsia.  Genitourinary:  Negative for dysuria, frequency, hematuria and urgency.  Musculoskeletal:  Negative for myalgias and neck pain.  Skin:  Negative for rash.  Allergic/Immunologic: Negative for environmental allergies.  Neurological:  Negative for dizziness and headaches.  Hematological:  Does not bruise/bleed easily.   Psychiatric/Behavioral:  Negative for suicidal ideas. The patient is not nervous/anxious.     Patient Active Problem List   Diagnosis Date Noted   Acute kidney injury superimposed on CKD ([place]) 04/06/2022   Acute on chronic systolic CHF (congestive heart failure) ([place]) 04/04/2022   NSTEMI (non-ST elevated myocardial infarction) ([place]) 04/04/2022   Hypotension 04/04/2022   Abnormal chest x-ray    Syncope 01/31/2022   Type 2 diabetes mellitus with hyperlipidemia ([place]) 01/31/2022   Depression with anxiety 01/31/2022   Chronic diastolic CHF (congestive heart failure) ([place]) 01/31/2022   Urinary tract infection, acute 01/31/2022   Elevated troponin 01/31/2022   H/O total hysterectomy with bilateral salpingo-oophorectomy (BSO) 10/29/2020   Anxiety 10/29/2020   Acute hypoxemic respiratory failure due to COVID-19 ([place]) 09/19/2019   Stage 3 chronic kidney disease ([place]) 09/02/2019   Diabetes mellitus with no complication ([place]) 64/68/0321   Familial multiple lipoprotein-type hyperlipidemia 01/03/2015   Creatinine elevation 01/03/2015   Diabetes ([place]) 12/15/2014   Hyperlipidemia 12/15/2014   Hypertension 12/15/2014   Gastro-esophageal reflux disease without esophagitis 12/15/2014    Allergies  Allergen Reactions   Sertraline     Made her more depressed    Past Surgical History:  Procedure Laterality Date   bladder tack     BREAST BIOPSY Left 10-15 yrs ago   benign   CATARACT EXTRACTION Bilateral 05/2015  CHOLECYSTECTOMY OPEN     VAGINAL HYSTERECTOMY      Social History   Tobacco Use   Smoking status: Former    Packs/day: 0.25    Years: 25.00    Total pack years: 6.25    Types: Cigarettes    Quit date: 1982    Years since quitting: 41.5   Smokeless tobacco: Never   Tobacco comments:    smoking cessation materials not required  Vaping Use   Vaping Use: Never used  Substance Use Topics   Alcohol use: No    Alcohol/week: 0.0 standard drinks of alcohol   Drug use: No      Medication list has been reviewed and updated.  Current Meds  Medication Sig   Acetaminophen (TYLENOL ARTHRITIS PAIN PO) Take 2 tablets by mouth daily as needed.    ADVOCATE LANCETS MISC use once daily as directed to test blood sugar   aspirin EC 81 MG tablet Take 1 tablet (81 mg total) by mouth daily. Swallow whole.   atorvastatin (LIPITOR) 40 MG tablet Take 1 tablet (40 mg total) by mouth every evening.   Blood Glucose Monitoring Suppl (FREESTYLE LITE) DEVI use once daily as directed to test blood sugar   clopidogrel (PLAVIX) 75 MG tablet Take 1 tablet (75 mg total) by mouth daily.   Elderberry 575 MG/5ML SYRP Take 1 Dose by mouth daily.    FREESTYLE LITE test strip USE 1 STRIP TO CHECK GLUCOSE ONCE DAILY AS DIRECTED   furosemide (LASIX) 20 MG tablet Take 1 tablet (20 mg total) by mouth daily.   JANUVIA 25 MG tablet Take 1 tablet (25 mg total) by mouth daily.   Lancet Devices (ADVOCATE LANCING DEVICE) MISC use once daily as directed to test blood sugar   lansoprazole (PREVACID) 15 MG capsule Take 1 capsule by mouth once daily (Patient taking differently: Take 15 mg by mouth daily at 12 noon. otc)   LORazepam (ATIVAN) 0.5 MG tablet Take 1 tablet (0.5 mg total) by mouth as needed. Per 30 days   metoprolol succinate (TOPROL-XL) 25 MG 24 hr tablet Take 0.5 tablets (12.5 mg total) by mouth at bedtime.   Omega-3 Fatty Acids (FISH OIL) 1000 MG CAPS Take 1 capsule (1,000 mg total) by mouth daily.       04/10/2022    2:40 PM 03/02/2022    9:28 AM 02/06/2022    9:53 AM 01/27/2022    4:22 PM  GAD 7 : Generalized Anxiety Score  Nervous, Anxious, on Edge 0 0 0 2  Control/stop worrying 0 1 0 1  Worry too much - different things 0 1 0 1  Trouble relaxing 2 0 0 1  Restless 0 0 0 1  Easily annoyed or irritable 0 0 0 1  Afraid - awful might happen 0 0 0 1  Total GAD 7 Score 2 2 0 8  Anxiety Difficulty Not difficult at all Not difficult at all Not difficult at all Not difficult at all        04/10/2022    2:40 PM 03/02/2022    9:28 AM 02/23/2022    9:40 AM  Depression screen PHQ 2/9  Decreased Interest 0 0 0  Down, Depressed, Hopeless 0 1 0  PHQ - 2 Score 0 1 0  Altered sleeping 2 0 1  Tired, decreased energy 2 0 0  Change in appetite 2 0 1  Feeling bad or failure about yourself  0 0 0  Trouble concentrating 0 0 0  Moving slowly or fidgety/restless 0 0 0  Suicidal thoughts 0 0 0  PHQ-9 Score '6 1 2  ' Difficult doing work/chores Very difficult Not difficult at all Not difficult at all    BP Readings from Last 3 Encounters:  04/10/22 120/80  04/06/22 118/81  03/02/22 130/80    Physical Exam Constitutional:      Appearance: Normal appearance.  HENT:     Head: Normocephalic.     Right Ear: Tympanic membrane normal.     Left Ear: Tympanic membrane normal.     Nose: Nose normal. No congestion or rhinorrhea.     Mouth/Throat:     Mouth: Mucous membranes are moist.  Eyes:     Extraocular Movements: Extraocular movements intact.     Pupils: Pupils are equal, round, and reactive to light.  Cardiovascular:     Pulses: Normal pulses.     Heart sounds: Normal heart sounds. No murmur heard.    No gallop.  Pulmonary:     Effort: No respiratory distress.     Breath sounds: No stridor. No wheezing or rhonchi.  Abdominal:     General: There is no distension.     Palpations: There is no mass.     Tenderness: There is no guarding or rebound.  Neurological:     Mental Status: She is alert.     Wt Readings from Last 3 Encounters:  04/10/22 120 lb (54.4 kg)  04/06/22 113 lb 5.1 oz (51.4 kg)  03/02/22 128 lb (58.1 kg)    BP 120/80   Pulse 60   Ht '5\' 5"'  (1.651 m)   Wt 120 lb (54.4 kg)   BMI 19.97 kg/m   Assessment and Plan:  1. Coronary artery disease involving native heart without angina pectoris, unspecified vessel or lesion type Patient is follow-up hospital discharge for non-STEMI myocardial infarction.  During hospitalization she had congestive heart failure  as well.  Patient has experienced no chest discomfort shortness of breath nor near syncopal symptoms.  Patient is currently on changes as follows she has resumed her furosemide, she has resumed her Januvia, she has stopped her losartan and been started on metoprolol.  Patient is to see Dr. Clayborn Bigness on August 17.  We will obtain renal function panel because of elevated creatinine and this will be obtained downstairs today and we will call her with results.  2. Type 2 diabetes mellitus without complication, without long-term current use of insulin (HCC) Chronic.  Controlled.  Stable.  Last A1c was 6.4 and patient is doing well.  We will not recheck at this time given the circumstances of the hospitalization and she is now returning to her normal status and she has resumed Januvia.  3. Essential hypertension Chronic.  Controlled.  Stable.  Blood pressure today is 120/80.  Patient experiencing no shortness of breath palpitations or dizziness.  We will check renal function panel specifically to assess GFR, creatinine, potassium. - Renal Function Panel  4. Chronic diastolic CHF (congestive heart failure) (HCC) Patient had acute congestive heart failure on chronic diastolic congestive heart failure there has been a change from losartan to metoprolol and she will be followed up with cardiology on the 17th.  We will check renal function panel for current status. - Renal Function Panel

## 2022-04-17 ENCOUNTER — Ambulatory Visit: Payer: Self-pay

## 2022-04-17 NOTE — Patient Outreach (Signed)
  Care Coordination   04/17/2022 Name: Jordan Thornton MRN: [id number] DOB: 10-26-1929   Care Coordination Outreach Attempts:  An unsuccessful telephone outreach was attempted today to offer the patient information about available care coordination services as a benefit of their health plan.   Follow Up Plan:  Additional outreach attempts will be made to offer the patient care coordination information and services.   Encounter Outcome:  No Answer  Care Coordination Interventions Activated:  No   Care Coordination Interventions:  No, not indicated    Noreene Larsson RN, MSN, CCM Community Care Coordinator [facility] Mobile: [phone]

## 2022-04-28 DIAGNOSIS — N179 Acute kidney failure, unspecified: Secondary | ICD-10-CM | POA: Diagnosis not present

## 2022-04-28 DIAGNOSIS — I10 Essential (primary) hypertension: Secondary | ICD-10-CM | POA: Diagnosis not present

## 2022-04-28 DIAGNOSIS — N189 Chronic kidney disease, unspecified: Secondary | ICD-10-CM | POA: Diagnosis not present

## 2022-04-28 DIAGNOSIS — I2510 Atherosclerotic heart disease of native coronary artery without angina pectoris: Secondary | ICD-10-CM | POA: Diagnosis not present

## 2022-04-28 DIAGNOSIS — I509 Heart failure, unspecified: Secondary | ICD-10-CM | POA: Diagnosis not present

## 2022-04-28 DIAGNOSIS — E119 Type 2 diabetes mellitus without complications: Secondary | ICD-10-CM | POA: Diagnosis not present

## 2022-04-28 DIAGNOSIS — I214 Non-ST elevation (NSTEMI) myocardial infarction: Secondary | ICD-10-CM | POA: Diagnosis not present

## 2022-04-28 DIAGNOSIS — E782 Mixed hyperlipidemia: Secondary | ICD-10-CM | POA: Diagnosis not present

## 2022-04-30 ENCOUNTER — Encounter: Payer: Self-pay | Admitting: Family

## 2022-04-30 ENCOUNTER — Ambulatory Visit: Payer: PPO | Attending: Family | Admitting: Family

## 2022-04-30 VITALS — BP 130/69 | HR 88 | Resp 14 | Ht 65.0 in | Wt 122.0 lb

## 2022-04-30 DIAGNOSIS — I5023 Acute on chronic systolic (congestive) heart failure: Secondary | ICD-10-CM | POA: Insufficient documentation

## 2022-04-30 DIAGNOSIS — K219 Gastro-esophageal reflux disease without esophagitis: Secondary | ICD-10-CM | POA: Diagnosis not present

## 2022-04-30 DIAGNOSIS — I10 Essential (primary) hypertension: Secondary | ICD-10-CM | POA: Diagnosis not present

## 2022-04-30 DIAGNOSIS — I110 Hypertensive heart disease with heart failure: Secondary | ICD-10-CM | POA: Insufficient documentation

## 2022-04-30 DIAGNOSIS — E1169 Type 2 diabetes mellitus with other specified complication: Secondary | ICD-10-CM | POA: Diagnosis not present

## 2022-04-30 DIAGNOSIS — E119 Type 2 diabetes mellitus without complications: Secondary | ICD-10-CM | POA: Insufficient documentation

## 2022-04-30 DIAGNOSIS — I252 Old myocardial infarction: Secondary | ICD-10-CM | POA: Diagnosis not present

## 2022-04-30 DIAGNOSIS — I5022 Chronic systolic (congestive) heart failure: Secondary | ICD-10-CM | POA: Diagnosis not present

## 2022-04-30 DIAGNOSIS — E785 Hyperlipidemia, unspecified: Secondary | ICD-10-CM

## 2022-04-30 NOTE — Patient Instructions (Addendum)
Continue weighing daily and call for an overnight weight gain of 3 pounds or more or a weekly weight gain of more than 5 pounds.   Call and make an appointment if you have any concerning symptoms such as weight gain, swelling in the legs, shortness of breath.   Continue to follow low sodium diet.

## 2022-04-30 NOTE — Progress Notes (Signed)
Patient ID: [place] McCoy Horiuchi, female    DOB: 12-12-1929, 88 y.o.   MRN: [id number]  HPI  Ms Wanat is a 88 y/o female with a history of HTN, DM, hyperlipidemia, GERD, previous tobacco use and chronic heart failure.   Last Echo limited 04/05/22 EF 30-35%. Echo 02/01/22 left ventricular EF 30-35%  Was in the ED on 04/03/22 for dyspnea, elevated troponin. NSTEMI and acute on chronic systolic heart failure.  Mrs Redford is a 88 year old female who presents as a new patient with no complaints. She was referred here after suffering from a NSTEMI 04/03/22. Her eGFR is 16 and she is being followed by nephrology. She has Diabetes Mellitus Type 2 and manages well. She takes her blood sugar daily amd follows a low-sodium diet. She is active at home. She denies dizziness, headaches, visual disturbances, SOB, chest pain, abdominal pain, constipation, diarrhea, nausea, nor swelling in lower extremities. Denies weakness or tremors.   Past Medical History:  Diagnosis Date   Diabetes mellitus without complication (HCC)    GERD (gastroesophageal reflux disease)    Hyperlipidemia    Hypertension    Past Surgical History:  Procedure Laterality Date   bladder tack     BREAST BIOPSY Left 10-15 yrs ago   benign   CATARACT EXTRACTION Bilateral 05/2015   CHOLECYSTECTOMY OPEN     VAGINAL HYSTERECTOMY     Family History  Problem Relation Age of Onset   Heart disease Maternal Aunt    Heart disease Maternal Grandmother    Stroke Father    Breast cancer Neg Hx    Social History   Tobacco Use   Smoking status: Former    Packs/day: 0.25    Years: 25.00    Total pack years: 6.25    Types: Cigarettes    Quit date: 1982    Years since quitting: 41.6   Smokeless tobacco: Never   Tobacco comments:    smoking cessation materials not required  Substance Use Topics   Alcohol use: No    Alcohol/week: 0.0 standard drinks of alcohol   Allergies  Allergen Reactions   Sertraline     Made her more  depressed   Prior to Admission medications   Medication Sig Start Date End Date Taking? Authorizing Provider  Acetaminophen (TYLENOL ARTHRITIS PAIN PO) Take 2 tablets by mouth daily as needed.    Yes [provider]  ADVOCATE LANCETS MISC use once daily as directed to test blood sugar 04/24/15  Yes Juline Patch, MD  aspirin EC 81 MG tablet Take 1 tablet (81 mg total) by mouth daily. Swallow whole. 02/02/22  Yes Lorella Nimrod, MD  atorvastatin (LIPITOR) 40 MG tablet Take 1 tablet (40 mg total) by mouth every evening. 04/06/22  Yes Wieting, Richard, MD  Blood Glucose Monitoring Suppl (FREESTYLE LITE) DEVI use once daily as directed to test blood sugar 04/24/15  Yes Juline Patch, MD  clopidogrel (PLAVIX) 75 MG tablet Take 1 tablet (75 mg total) by mouth daily. 04/07/22  Yes Loletha Grayer, MD  Elderberry 575 MG/5ML SYRP Take 1 Dose by mouth daily.    Yes [provider]  FREESTYLE LITE test strip USE 1 STRIP TO CHECK GLUCOSE ONCE DAILY AS DIRECTED 11/26/21  Yes Juline Patch, MD  furosemide (LASIX) 20 MG tablet Take 1 tablet (20 mg total) by mouth daily. Patient taking differently: Take 20 mg by mouth daily. Taking 1/2 tablet daily 04/06/22  Yes Loletha Grayer, MD  [facility] 25  MG tablet Take 1 tablet (25 mg total) by mouth daily. 03/02/22  Yes Juline Patch, MD  Lancet Devices (ADVOCATE LANCING DEVICE) MISC use once daily as directed to test blood sugar 04/24/15  Yes Juline Patch, MD  lansoprazole (PREVACID) 15 MG capsule Take 1 capsule by mouth once daily Patient taking differently: Take 15 mg by mouth daily at 12 noon. otc 06/23/21  Yes Juline Patch, MD  LORazepam (ATIVAN) 0.5 MG tablet Take 1 tablet (0.5 mg total) by mouth as needed. Per 30 days 01/27/22  Yes Juline Patch, MD  metoprolol succinate (TOPROL-XL) 25 MG 24 hr tablet Take 0.5 tablets (12.5 mg total) by mouth at bedtime. 04/06/22  Yes Wieting, Richard, MD  Omega-3 Fatty Acids (FISH OIL) 1000 MG CAPS Take 1  capsule (1,000 mg total) by mouth daily. 11/17/19  Yes Juline Patch, MD      Review of Systems  Constitutional:  Negative for appetite change and fatigue.  HENT:  Negative for congestion, postnasal drip and sore throat.   Eyes:  Negative for visual disturbance.  Respiratory:  Negative for apnea, cough and shortness of breath.   Cardiovascular:  Negative for chest pain, palpitations and leg swelling.  Gastrointestinal:  Negative for abdominal pain, blood in stool, constipation, diarrhea and nausea.  Endocrine: Negative.   Genitourinary:  Negative for difficulty urinating and frequency.  Musculoskeletal:  Positive for arthralgias.  Skin: Negative.   Allergic/Immunologic: Negative for environmental allergies and food allergies.  Neurological:  Negative for tremors, weakness, numbness and headaches.  Hematological:  Negative for adenopathy. Does not bruise/bleed easily.  Psychiatric/Behavioral:  Negative for dysphoric mood and sleep disturbance. The patient is not nervous/anxious.     Vitals:   04/30/22 1121  BP: 130/69  Pulse: 88  Resp: 14  SpO2: 100%  Weight: 122 lb (55.3 kg)  Height: '5\' 5"'  (1.651 m)   Wt Readings from Last 3 Encounters:  04/30/22 122 lb (55.3 kg)  04/10/22 120 lb (54.4 kg)  04/06/22 113 lb 5.1 oz (51.4 kg)    Lab Results  Component Value Date   CREATININE 2.70 (H) 04/10/2022   CREATININE 2.53 (H) 04/06/2022   CREATININE 2.38 (H) 04/05/2022     Physical Exam Constitutional:      Appearance: Normal appearance. She is normal weight.  HENT:     Head: Normocephalic and atraumatic.  Cardiovascular:     Rate and Rhythm: Normal rate and regular rhythm.     Pulses: Normal pulses.     Heart sounds: Normal heart sounds.  Pulmonary:     Effort: Pulmonary effort is normal.     Breath sounds: Normal breath sounds.  Abdominal:     General: There is no distension.     Palpations: Abdomen is soft.     Tenderness: There is no abdominal tenderness.   Musculoskeletal:        General: Normal range of motion.     Cervical back: Neck supple.     Right lower leg: No edema.     Left lower leg: No edema.  Skin:    General: Skin is warm and dry.  Neurological:     General: No focal deficit present.     Mental Status: She is alert and oriented to person, place, and time.  Psychiatric:        Mood and Affect: Mood normal.        Behavior: Behavior normal.        Thought Content: Thought  content normal.        Judgment: Judgment normal.    Assessment and Plan:  1) Chronic Heart failure with reduced ejection fraction 30-35%- - NYHA Class I - euvolemic - weighing daily, reminded to call for overnight weight gain of 3 lbs or greater and weekly gain of 5 lbs or greater  - Discussed continuing low-sodium diet and not adding salt to food - Saw Cardiology ([facility]) 04/28/22  - BNP was 2133.6 on 04/03/22  2) HTN- - BP mildly elevated (130/69) - PCP Ronnald Ramp) 04/10/22 - BMP 04/10/22 reviewed and showed Sodium 136, Potassium 4.5, Creatinine 2.70, and eGFR 16  3) DM- - Saw nephrology on 02/25/22   - Continue taking monitoring blood sugars daily  She is advised to continue seeing nephrology and call us here at clinic for symptoms such as weight gain 3 lbs a week, swelling of legs, fluid retention and shortness of breath. Patient will follow-up with [place] if needed.

## 2022-05-14 DIAGNOSIS — I509 Heart failure, unspecified: Secondary | ICD-10-CM | POA: Diagnosis not present

## 2022-05-21 DIAGNOSIS — E782 Mixed hyperlipidemia: Secondary | ICD-10-CM | POA: Diagnosis not present

## 2022-05-21 DIAGNOSIS — I509 Heart failure, unspecified: Secondary | ICD-10-CM | POA: Diagnosis not present

## 2022-05-21 DIAGNOSIS — N179 Acute kidney failure, unspecified: Secondary | ICD-10-CM | POA: Diagnosis not present

## 2022-05-21 DIAGNOSIS — I214 Non-ST elevation (NSTEMI) myocardial infarction: Secondary | ICD-10-CM | POA: Diagnosis not present

## 2022-05-21 DIAGNOSIS — I10 Essential (primary) hypertension: Secondary | ICD-10-CM | POA: Diagnosis not present

## 2022-05-21 DIAGNOSIS — I2510 Atherosclerotic heart disease of native coronary artery without angina pectoris: Secondary | ICD-10-CM | POA: Diagnosis not present

## 2022-05-21 DIAGNOSIS — N189 Chronic kidney disease, unspecified: Secondary | ICD-10-CM | POA: Diagnosis not present

## 2022-05-21 DIAGNOSIS — E119 Type 2 diabetes mellitus without complications: Secondary | ICD-10-CM | POA: Diagnosis not present

## 2022-07-02 ENCOUNTER — Encounter: Payer: Self-pay | Admitting: Family Medicine

## 2022-07-02 ENCOUNTER — Ambulatory Visit (INDEPENDENT_AMBULATORY_CARE_PROVIDER_SITE_OTHER): Payer: PPO | Admitting: Family Medicine

## 2022-07-02 VITALS — BP 134/82 | HR 76 | Ht 65.0 in | Wt 122.0 lb

## 2022-07-02 DIAGNOSIS — E119 Type 2 diabetes mellitus without complications: Secondary | ICD-10-CM

## 2022-07-02 NOTE — Progress Notes (Signed)
Date:  07/02/2022   Name:  Jordan Thornton   DOB:  [birth year]   MRN:  [id number]   Chief Complaint: Diabetes  Diabetes She presents for her follow-up diabetic visit. She has type 2 diabetes mellitus. Her disease course has been stable. There are no hypoglycemic associated symptoms. Pertinent negatives for hypoglycemia include no dizziness, headaches or nervousness/anxiousness. Pertinent negatives for diabetes include no blurred vision, no chest pain, no fatigue, no polydipsia, no polyuria, no visual change, no weakness and no weight loss. There are no hypoglycemic complications. Symptoms are stable. There are no diabetic complications. Current diabetic treatment includes diet.    Lab Results  Component Value Date   NA 136 04/10/2022   K 4.5 04/10/2022   CO2 25 04/10/2022   GLUCOSE 172 (H) 04/10/2022   BUN 53 (H) 04/10/2022   CREATININE 2.70 (H) 04/10/2022   CALCIUM 9.6 04/10/2022   EGFR 27 (L) 07/03/2021   GFRNONAA 16 (L) 04/10/2022   Lab Results  Component Value Date   CHOL 197 04/05/2022   HDL 67 04/05/2022   LDLCALC 117 (H) 04/05/2022   TRIG 67 04/05/2022   CHOLHDL 2.9 04/05/2022   No results found for: "TSH" Lab Results  Component Value Date   HGBA1C 6.5 (H) 01/31/2022   Lab Results  Component Value Date   WBC 6.0 04/06/2022   HGB 12.8 04/06/2022   HCT 38.1 04/06/2022   MCV 77.0 (L) 04/06/2022   PLT 192 04/06/2022   Lab Results  Component Value Date   ALT 15 01/31/2022   AST 19 01/31/2022   ALKPHOS 53 01/31/2022   BILITOT 1.2 01/31/2022   No results found for: "25OHVITD2", "25OHVITD3", "VD25OH"   Review of Systems  Constitutional:  Negative for chills, fatigue, fever and weight loss.  HENT:  Negative for drooling, ear discharge, ear pain and sore throat.   Eyes:  Negative for blurred vision.  Respiratory:  Negative for cough, shortness of breath and wheezing.   Cardiovascular:  Negative for chest pain, palpitations and leg swelling.   Gastrointestinal:  Negative for abdominal pain, blood in stool, constipation, diarrhea and nausea.  Endocrine: Negative for polydipsia and polyuria.  Genitourinary:  Negative for dysuria, frequency, hematuria and urgency.  Musculoskeletal:  Negative for back pain, myalgias and neck pain.  Skin:  Negative for rash.  Allergic/Immunologic: Negative for environmental allergies.  Neurological:  Negative for dizziness, weakness and headaches.  Hematological:  Does not bruise/bleed easily.  Psychiatric/Behavioral:  Negative for suicidal ideas. The patient is not nervous/anxious.     Patient Active Problem List   Diagnosis Date Noted   Acute kidney injury superimposed on CKD ([place]) 04/06/2022   Acute on chronic systolic CHF (congestive heart failure) ([place]) 04/04/2022   NSTEMI (non-ST elevated myocardial infarction) ([place]) 04/04/2022   Hypotension 04/04/2022   Abnormal chest x-ray    Syncope 01/31/2022   Type 2 diabetes mellitus with hyperlipidemia ([place]) 01/31/2022   Depression with anxiety 01/31/2022   Chronic diastolic CHF (congestive heart failure) ([place]) 01/31/2022   Urinary tract infection, acute 01/31/2022   Elevated troponin 01/31/2022   H/O total hysterectomy with bilateral salpingo-oophorectomy (BSO) 10/29/2020   Anxiety 10/29/2020   Acute hypoxemic respiratory failure due to COVID-19 ([place]) 09/19/2019   Stage 3 chronic kidney disease ([place]) 09/02/2019   Diabetes mellitus with no complication ([place]) 40/98/1191   Familial multiple lipoprotein-type hyperlipidemia 01/03/2015   Creatinine elevation 01/03/2015   Diabetes ([place]) 12/15/2014   Hyperlipidemia 12/15/2014   Hypertension 12/15/2014  Gastro-esophageal reflux disease without esophagitis 12/15/2014    Allergies  Allergen Reactions   Sertraline     Made her more depressed    Past Surgical History:  Procedure Laterality Date   bladder tack     BREAST BIOPSY Left 10-15 yrs ago   benign   CATARACT EXTRACTION Bilateral  05/2015   CHOLECYSTECTOMY OPEN     VAGINAL HYSTERECTOMY      Social History   Tobacco Use   Smoking status: Former    Packs/day: 0.25    Years: 25.00    Total pack years: 6.25    Types: Cigarettes    Quit date: 1982    Years since quitting: 41.8   Smokeless tobacco: Never   Tobacco comments:    smoking cessation materials not required  Vaping Use   Vaping Use: Never used  Substance Use Topics   Alcohol use: No    Alcohol/week: 0.0 standard drinks of alcohol   Drug use: No     Medication list has been reviewed and updated.  Current Meds  Medication Sig   Acetaminophen (TYLENOL ARTHRITIS PAIN PO) Take 2 tablets by mouth daily as needed.    ADVOCATE LANCETS MISC use once daily as directed to test blood sugar   aspirin EC 81 MG tablet Take 1 tablet (81 mg total) by mouth daily. Swallow whole.   atorvastatin (LIPITOR) 40 MG tablet Take 1 tablet (40 mg total) by mouth every evening.   Blood Glucose Monitoring Suppl (FREESTYLE LITE) DEVI use once daily as directed to test blood sugar   clopidogrel (PLAVIX) 75 MG tablet Take 1 tablet (75 mg total) by mouth daily.   Elderberry 575 MG/5ML SYRP Take 1 Dose by mouth daily.    FREESTYLE LITE test strip USE 1 STRIP TO CHECK GLUCOSE ONCE DAILY AS DIRECTED   furosemide (LASIX) 20 MG tablet Take 1 tablet (20 mg total) by mouth daily. (Patient taking differently: Take 20 mg by mouth daily. Taking 1/2 tablet daily)   JANUVIA 25 MG tablet Take 1 tablet (25 mg total) by mouth daily.   Lancet Devices (ADVOCATE LANCING DEVICE) MISC use once daily as directed to test blood sugar   lansoprazole (PREVACID) 15 MG capsule Take 1 capsule by mouth once daily (Patient taking differently: Take 15 mg by mouth daily at 12 noon. otc)   LORazepam (ATIVAN) 0.5 MG tablet Take 1 tablet (0.5 mg total) by mouth as needed. Per 30 days   metoprolol succinate (TOPROL-XL) 25 MG 24 hr tablet Take 0.5 tablets (12.5 mg total) by mouth at bedtime.   Omega-3 Fatty Acids  (FISH OIL) 1000 MG CAPS Take 1 capsule (1,000 mg total) by mouth daily.       07/02/2022    9:21 AM 04/30/2022   11:34 AM 04/10/2022    2:40 PM 03/02/2022    9:28 AM  GAD 7 : Generalized Anxiety Score  Nervous, Anxious, on Edge 0 0 0 0  Control/stop worrying 0 0 0 1  Worry too much - different things 0 0 0 1  Trouble relaxing 0 0 2 0  Restless 0 0 0 0  Easily annoyed or irritable 0 0 0 0  Afraid - awful might happen 0 0 0 0  Total GAD 7 Score 0 0 2 2  Anxiety Difficulty Not difficult at all Not difficult at all Not difficult at all Not difficult at all       07/02/2022    9:21 AM 04/30/2022   11:33  AM 04/10/2022    2:40 PM  Depression screen PHQ 2/9  Decreased Interest 0 0 0  Down, Depressed, Hopeless 0 0 0  PHQ - 2 Score 0 0 0  Altered sleeping 0  2  Tired, decreased energy 0  2  Change in appetite 0  2  Feeling bad or failure about yourself  0  0  Trouble concentrating 0  0  Moving slowly or fidgety/restless 0  0  Suicidal thoughts 0  0  PHQ-9 Score 0  6  Difficult doing work/chores Not difficult at all  Very difficult    BP Readings from Last 3 Encounters:  07/02/22 134/82  04/30/22 130/69  04/10/22 120/80    Physical Exam Vitals and nursing note reviewed. Exam conducted with a chaperone present.  Constitutional:      General: She is not in acute distress.    Appearance: She is not diaphoretic.  HENT:     Head: Normocephalic and atraumatic.     Right Ear: Tympanic membrane and external ear normal.     Left Ear: Tympanic membrane and external ear normal.     Nose: Nose normal.     Mouth/Throat:     Mouth: Mucous membranes are moist.  Eyes:     General:        Right eye: No discharge.        Left eye: No discharge.     Conjunctiva/sclera: Conjunctivae normal.     Pupils: Pupils are equal, round, and reactive to light.  Neck:     Thyroid: No thyromegaly.     Vascular: No JVD.  Cardiovascular:     Rate and Rhythm: Normal rate and regular rhythm.      Heart sounds: Normal heart sounds. No murmur heard.    No friction rub. No gallop.  Pulmonary:     Effort: Pulmonary effort is normal.     Breath sounds: Normal breath sounds. No wheezing or rhonchi.  Abdominal:     General: Bowel sounds are normal.     Palpations: Abdomen is soft. There is no mass.     Tenderness: There is no abdominal tenderness. There is no guarding.  Musculoskeletal:        General: Normal range of motion.     Cervical back: Normal range of motion and neck supple.  Lymphadenopathy:     Cervical: No cervical adenopathy.  Skin:    General: Skin is warm and dry.  Neurological:     Mental Status: She is alert.     Deep Tendon Reflexes: Reflexes are normal and symmetric.     Wt Readings from Last 3 Encounters:  07/02/22 122 lb (55.3 kg)  04/30/22 122 lb (55.3 kg)  04/10/22 120 lb (54.4 kg)    BP 134/82 (BP Location: Left Arm, Cuff Size: Normal)   Pulse 76   Ht '5\' 5"'  (1.651 m)   Wt 122 lb (55.3 kg)   SpO2 99%   BMI 20.30 kg/m   Assessment and Plan:  1. Type 2 diabetes mellitus without complication, without long-term current use of insulin (HCC) Chronic.  Controlled.  Stable.  Were anticipating another prior authorization battle for Januvia at only 25 mg.  At this point in time we will check an A1c and I am contemplating discontinuing the Januvia and have sent a note to Dr. Latif/nephrology if [place] Glade is in the foreseeable future.  If so I would definitely discontinue Januvia. - HgB A1c    Otilio Miu, MD

## 2022-07-03 LAB — HEMOGLOBIN A1C
Est. average glucose Bld gHb Est-mCnc: 146 mg/dL
Hgb A1c MFr Bld: 6.7 % — ABNORMAL HIGH (ref 4.8–5.6)

## 2022-07-06 DIAGNOSIS — I10 Essential (primary) hypertension: Secondary | ICD-10-CM | POA: Diagnosis not present

## 2022-07-06 DIAGNOSIS — N2581 Secondary hyperparathyroidism of renal origin: Secondary | ICD-10-CM | POA: Diagnosis not present

## 2022-07-06 DIAGNOSIS — E1122 Type 2 diabetes mellitus with diabetic chronic kidney disease: Secondary | ICD-10-CM | POA: Diagnosis not present

## 2022-07-06 DIAGNOSIS — N184 Chronic kidney disease, stage 4 (severe): Secondary | ICD-10-CM | POA: Diagnosis not present

## 2022-07-09 DIAGNOSIS — N184 Chronic kidney disease, stage 4 (severe): Secondary | ICD-10-CM | POA: Diagnosis not present

## 2022-07-13 ENCOUNTER — Telehealth: Payer: Self-pay | Admitting: Pharmacist

## 2022-07-13 NOTE — Progress Notes (Signed)
[facility])  [place] Team    07/13/2022  Jordan Thornton 1929/10/31 [id number]  Reason for referral: Medication Assistance  Referral source:  2024 Re-Enrollment Januvia  Current insurance: [employer]  Outreach:  Successful telephone call with Jordan Thornton.  HIPAA identifiers verified.   Objective: The ASCVD Risk score (Arnett DK, et al., 2019) failed to calculate for the following reasons:   The 2019 ASCVD risk score is only valid for ages 36 to 40   The patient has a prior MI or stroke diagnosis  Lab Results  Component Value Date   CREATININE 2.70 (H) 04/10/2022   CREATININE 2.53 (H) 04/06/2022   CREATININE 2.38 (H) 04/05/2022    Lab Results  Component Value Date   HGBA1C 6.7 (H) 07/02/2022    Lipid Panel     Component Value Date/Time   CHOL 197 04/05/2022 0606   CHOL 207 (H) 07/03/2021 1024   TRIG 67 04/05/2022 0606   HDL 67 04/05/2022 0606   HDL 71 07/03/2021 1024   CHOLHDL 2.9 04/05/2022 0606   VLDL 13 04/05/2022 0606   LDLCALC 117 (H) 04/05/2022 0606   LDLCALC 119 (H) 07/03/2021 1024    BP Readings from Last 3 Encounters:  07/02/22 134/82  04/30/22 130/69  04/10/22 120/80    Allergies  Allergen Reactions   Sertraline     Made her more depressed    Assessment: Jordan Thornton would like to  re-enroll with patient assistance for Januvia with [place] for 2024. Discussed medication changes and Jordan Thornton confirms that she will remain on Januvia.    Extra Help:  Not eligible for Extra Help Low Income Subsidy based on reported income and assets  Patient Assistance Programs: [place] made by [place] requirement met: Yes Out-of-pocket prescription expenditure met:   Not Applicable Patient has met application requirements to apply for this program.    Plan: I will route patient assistance letter to [place] technician who will coordinate patient assistance program application process  for medications listed above.  [facility] pharmacy technician will assist with obtaining all required documents from both patient and provider(s) and submit application(s) once completed.    Loretha Brasil, PharmD [place] Pharmacist Office: [phone]

## 2022-07-21 DIAGNOSIS — I5022 Chronic systolic (congestive) heart failure: Secondary | ICD-10-CM | POA: Diagnosis not present

## 2022-07-21 DIAGNOSIS — I2510 Atherosclerotic heart disease of native coronary artery without angina pectoris: Secondary | ICD-10-CM | POA: Diagnosis not present

## 2022-07-21 DIAGNOSIS — E119 Type 2 diabetes mellitus without complications: Secondary | ICD-10-CM | POA: Diagnosis not present

## 2022-07-21 DIAGNOSIS — J9601 Acute respiratory failure with hypoxia: Secondary | ICD-10-CM | POA: Diagnosis not present

## 2022-07-21 DIAGNOSIS — E782 Mixed hyperlipidemia: Secondary | ICD-10-CM | POA: Diagnosis not present

## 2022-07-21 DIAGNOSIS — I509 Heart failure, unspecified: Secondary | ICD-10-CM | POA: Diagnosis not present

## 2022-07-21 DIAGNOSIS — I255 Ischemic cardiomyopathy: Secondary | ICD-10-CM | POA: Diagnosis not present

## 2022-07-21 DIAGNOSIS — I214 Non-ST elevation (NSTEMI) myocardial infarction: Secondary | ICD-10-CM | POA: Diagnosis not present

## 2022-07-21 DIAGNOSIS — I10 Essential (primary) hypertension: Secondary | ICD-10-CM | POA: Diagnosis not present

## 2022-07-21 DIAGNOSIS — U071 COVID-19: Secondary | ICD-10-CM | POA: Diagnosis not present

## 2022-07-27 ENCOUNTER — Emergency Department: Payer: PPO

## 2022-07-27 ENCOUNTER — Other Ambulatory Visit: Payer: Self-pay

## 2022-07-27 ENCOUNTER — Inpatient Hospital Stay
Admission: EM | Admit: 2022-07-27 | Discharge: 2022-07-30 | DRG: 291 | Disposition: A | Discharge status: Home / Self Care | Payer: PPO | Attending: Internal Medicine | Admitting: Internal Medicine

## 2022-07-27 ENCOUNTER — Encounter: Payer: Self-pay | Admitting: Emergency Medicine

## 2022-07-27 DIAGNOSIS — I2510 Atherosclerotic heart disease of native coronary artery without angina pectoris: Secondary | ICD-10-CM | POA: Diagnosis not present

## 2022-07-27 DIAGNOSIS — I509 Heart failure, unspecified: Principal | ICD-10-CM

## 2022-07-27 DIAGNOSIS — Z8249 Family history of ischemic heart disease and other diseases of the circulatory system: Secondary | ICD-10-CM

## 2022-07-27 DIAGNOSIS — Z9842 Cataract extraction status, left eye: Secondary | ICD-10-CM

## 2022-07-27 DIAGNOSIS — D631 Anemia in chronic kidney disease: Secondary | ICD-10-CM | POA: Diagnosis not present

## 2022-07-27 DIAGNOSIS — Z823 Family history of stroke: Secondary | ICD-10-CM | POA: Diagnosis not present

## 2022-07-27 DIAGNOSIS — I5023 Acute on chronic systolic (congestive) heart failure: Secondary | ICD-10-CM | POA: Diagnosis present

## 2022-07-27 DIAGNOSIS — Z87891 Personal history of nicotine dependence: Secondary | ICD-10-CM

## 2022-07-27 DIAGNOSIS — Z7902 Long term (current) use of antithrombotics/antiplatelets: Secondary | ICD-10-CM | POA: Diagnosis not present

## 2022-07-27 DIAGNOSIS — Z7984 Long term (current) use of oral hypoglycemic drugs: Secondary | ICD-10-CM | POA: Diagnosis not present

## 2022-07-27 DIAGNOSIS — E1169 Type 2 diabetes mellitus with other specified complication: Secondary | ICD-10-CM | POA: Diagnosis present

## 2022-07-27 DIAGNOSIS — K219 Gastro-esophageal reflux disease without esophagitis: Secondary | ICD-10-CM | POA: Diagnosis present

## 2022-07-27 DIAGNOSIS — I2489 Other forms of acute ischemic heart disease: Secondary | ICD-10-CM | POA: Diagnosis not present

## 2022-07-27 DIAGNOSIS — Z9079 Acquired absence of other genital organ(s): Secondary | ICD-10-CM

## 2022-07-27 DIAGNOSIS — Z90710 Acquired absence of both cervix and uterus: Secondary | ICD-10-CM

## 2022-07-27 DIAGNOSIS — E1122 Type 2 diabetes mellitus with diabetic chronic kidney disease: Secondary | ICD-10-CM | POA: Diagnosis not present

## 2022-07-27 DIAGNOSIS — F32A Depression, unspecified: Secondary | ICD-10-CM | POA: Diagnosis not present

## 2022-07-27 DIAGNOSIS — J9811 Atelectasis: Secondary | ICD-10-CM | POA: Diagnosis not present

## 2022-07-27 DIAGNOSIS — R0600 Dyspnea, unspecified: Secondary | ICD-10-CM

## 2022-07-27 DIAGNOSIS — I428 Other cardiomyopathies: Secondary | ICD-10-CM | POA: Diagnosis present

## 2022-07-27 DIAGNOSIS — I493 Ventricular premature depolarization: Secondary | ICD-10-CM | POA: Diagnosis present

## 2022-07-27 DIAGNOSIS — I130 Hypertensive heart and chronic kidney disease with heart failure and stage 1 through stage 4 chronic kidney disease, or unspecified chronic kidney disease: Secondary | ICD-10-CM | POA: Diagnosis not present

## 2022-07-27 DIAGNOSIS — R7989 Other specified abnormal findings of blood chemistry: Secondary | ICD-10-CM | POA: Diagnosis not present

## 2022-07-27 DIAGNOSIS — I10 Essential (primary) hypertension: Secondary | ICD-10-CM | POA: Diagnosis present

## 2022-07-27 DIAGNOSIS — Z79899 Other long term (current) drug therapy: Secondary | ICD-10-CM

## 2022-07-27 DIAGNOSIS — I252 Old myocardial infarction: Secondary | ICD-10-CM | POA: Diagnosis not present

## 2022-07-27 DIAGNOSIS — Z7982 Long term (current) use of aspirin: Secondary | ICD-10-CM

## 2022-07-27 DIAGNOSIS — F419 Anxiety disorder, unspecified: Secondary | ICD-10-CM | POA: Diagnosis present

## 2022-07-27 DIAGNOSIS — R0602 Shortness of breath: Secondary | ICD-10-CM | POA: Diagnosis not present

## 2022-07-27 DIAGNOSIS — F418 Other specified anxiety disorders: Secondary | ICD-10-CM | POA: Diagnosis present

## 2022-07-27 DIAGNOSIS — N184 Chronic kidney disease, stage 4 (severe): Secondary | ICD-10-CM | POA: Diagnosis not present

## 2022-07-27 DIAGNOSIS — E785 Hyperlipidemia, unspecified: Secondary | ICD-10-CM | POA: Diagnosis not present

## 2022-07-27 DIAGNOSIS — Z888 Allergy status to other drugs, medicaments and biological substances status: Secondary | ICD-10-CM

## 2022-07-27 DIAGNOSIS — Z90722 Acquired absence of ovaries, bilateral: Secondary | ICD-10-CM

## 2022-07-27 DIAGNOSIS — Z9841 Cataract extraction status, right eye: Secondary | ICD-10-CM

## 2022-07-27 DIAGNOSIS — I110 Hypertensive heart disease with heart failure: Secondary | ICD-10-CM | POA: Diagnosis not present

## 2022-07-27 LAB — CBC WITH DIFFERENTIAL/PLATELET
Abs Immature Granulocytes: 0.04 10*3/uL (ref 0.00–0.07)
Basophils Absolute: 0 10*3/uL (ref 0.0–0.1)
Basophils Relative: 0 %
Eosinophils Absolute: 0 10*3/uL (ref 0.0–0.5)
Eosinophils Relative: 0 %
HCT: 33.2 % — ABNORMAL LOW (ref 36.0–46.0)
Hemoglobin: 11.1 g/dL — ABNORMAL LOW (ref 12.0–15.0)
Immature Granulocytes: 1 %
Lymphocytes Relative: 12 %
Lymphs Abs: 1 10*3/uL (ref 0.7–4.0)
MCH: 26 pg (ref 26.0–34.0)
MCHC: 33.4 g/dL (ref 30.0–36.0)
MCV: 77.8 fL — ABNORMAL LOW (ref 80.0–100.0)
Monocytes Absolute: 0.5 10*3/uL (ref 0.1–1.0)
Monocytes Relative: 6 %
Neutro Abs: 6.5 10*3/uL (ref 1.7–7.7)
Neutrophils Relative %: 81 %
Platelets: 166 10*3/uL (ref 150–400)
RBC: 4.27 MIL/uL (ref 3.87–5.11)
RDW: 16.6 % — ABNORMAL HIGH (ref 11.5–15.5)
WBC: 8 10*3/uL (ref 4.0–10.5)
nRBC: 0 % (ref 0.0–0.2)

## 2022-07-27 LAB — BASIC METABOLIC PANEL
Anion gap: 10 (ref 5–15)
BUN: 50 mg/dL — ABNORMAL HIGH (ref 8–23)
CO2: 21 mmol/L — ABNORMAL LOW (ref 22–32)
Calcium: 9.4 mg/dL (ref 8.9–10.3)
Chloride: 111 mmol/L (ref 98–111)
Creatinine, Ser: 2.44 mg/dL — ABNORMAL HIGH (ref 0.44–1.00)
GFR, Estimated: 18 mL/min — ABNORMAL LOW (ref >60)
Glucose, Bld: 139 mg/dL — ABNORMAL HIGH (ref 70–99)
Potassium: 4.5 mmol/L (ref 3.5–5.1)
Sodium: 142 mmol/L (ref 135–145)

## 2022-07-27 LAB — TROPONIN I (HIGH SENSITIVITY)
Troponin I (High Sensitivity): 304 ng/L (ref <18)
Troponin I (High Sensitivity): 413 ng/L (ref <18)
Troponin I (High Sensitivity): 727 ng/L (ref <18)
Troponin I (High Sensitivity): 898 ng/L (ref <18)

## 2022-07-27 LAB — MAGNESIUM: Magnesium: 2.2 mg/dL (ref 1.7–2.4)

## 2022-07-27 LAB — APTT: aPTT: 32 seconds (ref 24–36)

## 2022-07-27 LAB — CBG MONITORING, ED
Glucose-Capillary: 89 mg/dL (ref 70–99)
Glucose-Capillary: 101 mg/dL — ABNORMAL HIGH (ref 70–99)

## 2022-07-27 LAB — PROTIME-INR
INR: 1.2 (ref 0.8–1.2)
Prothrombin Time: 15 seconds (ref 11.4–15.2)

## 2022-07-27 LAB — BRAIN NATRIURETIC PEPTIDE: B Natriuretic Peptide: >4500 pg/mL — ABNORMAL HIGH (ref 0.0–100.0)

## 2022-07-27 MED ORDER — HEPARIN SODIUM (PORCINE) 5000 UNIT/ML IJ SOLN: 5000.0000 [IU] | Freq: Three times a day (TID) | INTRAMUSCULAR | Status: DC

## 2022-07-27 MED ORDER — HYDRALAZINE HCL 10 MG PO TABS
10.0000 mg | ORAL_TABLET | Freq: Three times a day (TID) | ORAL | Status: DC
  Administered 2022-07-28 – 2022-07-30 (×4): 10 mg via ORAL
  Filled 2022-07-27 (×10): qty 1

## 2022-07-27 MED ORDER — ATORVASTATIN CALCIUM 20 MG PO TABS
40.0000 mg | ORAL_TABLET | Freq: Every evening | ORAL | Status: DC
  Administered 2022-07-27 – 2022-07-29 (×3): 40 mg via ORAL
  Filled 2022-07-27 (×3): qty 2

## 2022-07-27 MED ORDER — HEPARIN (PORCINE) 25000 UT/250ML-% IV SOLN
700.0000 [IU]/h | INTRAVENOUS | Status: AC
  Administered 2022-07-27 – 2022-07-29 (×2): 700 [IU]/h via INTRAVENOUS
  Filled 2022-07-27 (×2): qty 250

## 2022-07-27 MED ORDER — FUROSEMIDE 10 MG/ML IJ SOLN
40.0000 mg | Freq: Once | INTRAMUSCULAR | Status: AC
  Administered 2022-07-27: 40 mg via INTRAVENOUS
  Filled 2022-07-27: qty 4

## 2022-07-27 MED ORDER — LORAZEPAM 0.5 MG PO TABS
0.5000 mg | ORAL_TABLET | Freq: Four times a day (QID) | ORAL | Status: DC | PRN
  Administered 2022-07-28: 0.5 mg via ORAL
  Filled 2022-07-27: qty 1

## 2022-07-27 MED ORDER — SENNOSIDES-DOCUSATE SODIUM 8.6-50 MG PO TABS: 1.0000 | ORAL_TABLET | Freq: Every evening | ORAL | Status: DC | PRN

## 2022-07-27 MED ORDER — INSULIN ASPART 100 UNIT/ML IJ SOLN
0.0000 [IU] | Freq: Three times a day (TID) | INTRAMUSCULAR | Status: DC
  Administered 2022-07-29: 2 [IU] via SUBCUTANEOUS
  Filled 2022-07-27: qty 1

## 2022-07-27 MED ORDER — LORAZEPAM 0.5 MG PO TABS: 0.5000 mg | ORAL_TABLET | ORAL | Status: DC | PRN

## 2022-07-27 MED ORDER — METOPROLOL SUCCINATE ER 25 MG PO TB24
12.5000 mg | ORAL_TABLET | Freq: Every day | ORAL | Status: DC
  Administered 2022-07-27 – 2022-07-29 (×3): 12.5 mg via ORAL
  Filled 2022-07-27: qty 0.5
  Filled 2022-07-27 (×2): qty 1
  Filled 2022-07-27: qty 0.5

## 2022-07-27 MED ORDER — HEPARIN BOLUS VIA INFUSION
3300.0000 [IU] | Freq: Once | INTRAVENOUS | Status: AC
  Administered 2022-07-27: 3300 [IU] via INTRAVENOUS
  Filled 2022-07-27: qty 3300

## 2022-07-27 MED ORDER — ISOSORBIDE MONONITRATE ER 30 MG PO TB24
15.0000 mg | ORAL_TABLET | Freq: Every day | ORAL | Status: DC
  Administered 2022-07-28 – 2022-07-30 (×3): 15 mg via ORAL
  Filled 2022-07-27 (×5): qty 1

## 2022-07-27 MED ORDER — ACETAMINOPHEN 325 MG PO TABS
650.0000 mg | ORAL_TABLET | Freq: Four times a day (QID) | ORAL | Status: DC | PRN
  Administered 2022-07-28 – 2022-07-29 (×3): 650 mg via ORAL
  Filled 2022-07-27 (×3): qty 2

## 2022-07-27 MED ORDER — ASPIRIN 81 MG PO TBEC
81.0000 mg | DELAYED_RELEASE_TABLET | Freq: Every day | ORAL | Status: DC
  Administered 2022-07-28 – 2022-07-30 (×3): 81 mg via ORAL
  Filled 2022-07-27 (×3): qty 1

## 2022-07-27 MED ORDER — ONDANSETRON HCL 4 MG/2ML IJ SOLN
4.0000 mg | Freq: Four times a day (QID) | INTRAMUSCULAR | Status: DC | PRN
  Administered 2022-07-29: 4 mg via INTRAVENOUS
  Filled 2022-07-27: qty 2

## 2022-07-27 MED ORDER — ASPIRIN 81 MG PO CHEW
324.0000 mg | CHEWABLE_TABLET | Freq: Once | ORAL | Status: AC
  Administered 2022-07-27: 324 mg via ORAL
  Filled 2022-07-27: qty 4

## 2022-07-27 MED ORDER — ONDANSETRON HCL 4 MG PO TABS: 4.0000 mg | ORAL_TABLET | Freq: Four times a day (QID) | ORAL | Status: DC | PRN

## 2022-07-27 MED ORDER — FUROSEMIDE 10 MG/ML IJ SOLN
40.0000 mg | Freq: Two times a day (BID) | INTRAMUSCULAR | Status: AC
  Administered 2022-07-28 (×2): 40 mg via INTRAVENOUS
  Filled 2022-07-27 (×2): qty 4

## 2022-07-27 MED ORDER — ACETAMINOPHEN 650 MG RE SUPP: 650.0000 mg | Freq: Four times a day (QID) | RECTAL | Status: DC | PRN

## 2022-07-27 MED ORDER — INSULIN ASPART 100 UNIT/ML IJ SOLN: 0.0000 [IU] | Freq: Every day | INTRAMUSCULAR | Status: DC

## 2022-07-27 MED ORDER — LABETALOL HCL 5 MG/ML IV SOLN: 5.0000 mg | INTRAVENOUS | Status: DC | PRN

## 2022-07-27 NOTE — Assessment & Plan Note (Signed)
-   Atorvastatin 40 mg nightly resumed 

## 2022-07-27 NOTE — ED Notes (Signed)
Dr. Tobie Poet made aware that troponin levels increasing - see new orders.

## 2022-07-27 NOTE — Consult Note (Signed)
[place] NOTE       Patient ID: Jordan Thornton MRN: [id number] DOB/AGE: Oct 21, 1929 88 y.o.  Admit date: 07/27/2022 Referring Physician Dr. Rupert Stacks Primary Physician Dr. Otilio Miu Primary Cardiologist Dr. Clayborn Bigness Reason for Consultation acute on chronic HFrEF, medication optimization  HPI: Jordan Thornton is a 22yoF with a PMH of HFrEF (30-35%, LVH, g1dd), CAD (NSTEMI 03/2022, treated medically), CKD 4 (baseline Cr 1.98-2.2), DM2, HTN, HLD who presented to [facility] ED 07/27/2022 with worsening shortness of breath over the past 3-4 days with associated PND.  Cardiology is consulted for further assistance with her heart failure.  She is known to our service from a prior hospitalization in July 2023 for acute on chronic HFrEF and a NSTEMI.  She presented with worsening shortness of breath and PND, was diuresed with IV Lasix, 48 hours of heparin infusion, and was discharged on dual antiplatelet therapy.  She was seen in clinic 05/21/2022 for follow-up of a Lexiscan Myoview that showed a globally depressed LVEF estimated at 20-25% with a large apical inferior persistent defect c/w infarct & likely NICM.  Further evaluation with a LHC was considered, but was ultimately recommended to defer this until the patient was seen by nephrology, and the patient wanted to discuss with her family further.  She subsequently, she saw her nephrologist on 07/06/2022, where her labs appeared stable and no changes were made to her medications (SGLT2i was not added due to a GFR of 24). She was seen again in clinic on 11/7 by Dr. Clayborn Bigness, where she was started on hydralazine 12.5 mg twice daily, and isosorbide 15 mg once daily for GDMT and she was continued on Lasix 10 mg once daily, metoprolol XL 12.5 mg once daily, and atorvastatin 40 mg once daily..  She presents today with her husband, stating she has felt short of breath primarily when she lays down to sleep at night, worsening over  the past two days. Feels her breathing is better when she is sitting forward, and has been up all night/not sleeping well the past 2 nights due to this.  She has had the sensation of inability to get a good breath and reports "wheezing" at night when she cannot breathe.  Prior to presentation today she had some chest heaviness that she had a hard time describing that is not necessarily associated with her breathing, she cannot tell me how long she heaviness lasted for, nor if she had any associated or worrisome diaphoresis or nausea with it. She has had some presyncopal symptoms she describes as "just feeling like she could pass out" but not necessarily dizziness, primarily weakness in both her legs to where she feels like she needs to stand still and get her bearings before the feeling passes. She admits to compliance with her medications, denies significant weight gain or needing to take any extra lasix for a >5 pound weight gain in 3 days. She denies peripheral edema or abdominal swelling/bloating. She typically watches her salt and does not add salt to her foods, but notes she ate a pot roast for dinner last night (somewhat unusual for her).  Denies any decrease in her urine output.  She was given a dose of IV Lasix 40 mg x 1 in addition to 324 mg of aspirin. At my time of evaluation afternoon, patient says she feels like she can breathe easier.  Denies further chest heaviness.  There is at least 300 cc of urine in the canister at bedside.  Labs are notable for BUN/creatinine 50/2.44 and GFR of 18, potassium 4.5.  BNP markedly elevated at >4500, high-sensitivity troponin elevated with a trend of 304-415.  No leukocytosis with WBCs 8.0, H&H slightly low at 11/33, MCV 78.8.  Platelets 166.  Chest x-ray with mild bibasilar subsegmental atelectasis with small bilateral pleural effusions.  Her EKG shows sinus rhythm with PACs and PVCs with rate 74 bpm.  Review of systems complete and found to be negative unless  listed above     Past Medical History:  Diagnosis Date   CHF (congestive heart failure) (HCC)    Diabetes mellitus without complication (HCC)    GERD (gastroesophageal reflux disease)    Hyperlipidemia    Hypertension     Past Surgical History:  Procedure Laterality Date   bladder tack     BREAST BIOPSY Left 10-15 yrs ago   benign   CATARACT EXTRACTION Bilateral 05/2015   CHOLECYSTECTOMY OPEN     VAGINAL HYSTERECTOMY      (Not in a hospital admission)  Social History   Socioeconomic History   Marital status: Married    Spouse name: Not on file   Number of children: 2   Years of education: Not on file   Highest education level: Bachelor's degree (e.g., BA, AB, BS)  Occupational History    Employer: RETIRED  Tobacco Use   Smoking status: Former    Packs/day: 0.25    Years: 25.00    Total pack years: 6.25    Types: Cigarettes    Quit date: 1982    Years since quitting: 41.8   Smokeless tobacco: Never   Tobacco comments:    smoking cessation materials not required  Vaping Use   Vaping Use: Never used  Substance and Sexual Activity   Alcohol use: No    Alcohol/week: 0.0 standard drinks of alcohol   Drug use: No   Sexual activity: Not Currently  Other Topics Concern   Not on file  Social History Narrative   Not on file   Social Determinants of Health   Financial Resource Strain: Low Risk  (02/23/2022)   Overall Financial Resource Strain (CARDIA)    Difficulty of Paying Living Expenses: Not hard at all  Food Insecurity: No Food Insecurity (02/23/2022)   Hunger Vital Sign    Worried About Running Out of Food in the Last Year: Never true    Ran Out of Food in the Last Year: Never true  Transportation Needs: No Transportation Needs (02/23/2022)   PRAPARE - Hydrologist (Medical): No    Lack of Transportation (Non-Medical): No  Physical Activity: Inactive (02/23/2022)   Exercise Vital Sign    Days of Exercise per Week: 0 days     Minutes of Exercise per Session: 0 min  Stress: No Stress Concern Present (02/23/2022)   [place]    Feeling of Stress : Only a little  Social Connections: Moderately Integrated (02/23/2022)   Social Connection and Isolation Panel [NHANES]    Frequency of Communication with Friends and Family: More than three times a week    Frequency of Social Gatherings with Friends and Family: Three times a week    Attends Religious Services: More than 4 times per year    Active Member of Clubs or Organizations: No    Attends Archivist Meetings: Never    Marital Status: Married  Human resources officer Violence: Not At Risk (02/23/2022)  Humiliation, Afraid, Rape, and Kick questionnaire    Fear of Current or Ex-Partner: No    Emotionally Abused: No    Physically Abused: No    Sexually Abused: No    Family History  Problem Relation Age of Onset   Heart disease Maternal Aunt    Heart disease Maternal Grandmother    Stroke Father    Breast cancer Neg Hx      Vitals:   07/27/22 1212 07/27/22 1213 07/27/22 1352 07/27/22 1354  BP: (!) 140/80  120/79   Pulse:   94   Resp:   (!) 21   Temp:  97.6 F (36.4 C)    TempSrc:  Oral    SpO2:   100%   Weight:    55.2 kg  Height:    5\' 5"  (1.651 m)    PHYSICAL EXAM General: Pleasant elderly black female, well nourished, in no acute distress.  Appears younger than stated age, sitting upright in ED stretcher with husband at bedside. HEENT:  Normocephalic and atraumatic. Neck:  + JVD.  Lungs: Normal respiratory effort on room air.  Trace bibasilar crackles without appreciable wheezes.   Heart: HRRR with frequent premature beats. Normal S1 and S2 without gallops or murmurs.  Abdomen: Soft, nontender, nondistended.    Msk: Normal strength and tone for age. Extremities: Warm and well perfused. No clubbing, cyanosis.  Trace right lower extremity edema edema.  Neuro: Alert and oriented X  3. Psych:  Answers questions appropriately.   Labs: Basic Metabolic Panel: Recent Labs    07/27/22 1212  NA 142  K 4.5  CL 111  CO2 21*  GLUCOSE 139*  BUN 50*  CREATININE 2.44*  CALCIUM 9.4   Liver Function Tests: No results for input(s): "AST", "ALT", "ALKPHOS", "BILITOT", "PROT", "ALBUMIN" in the last 72 hours. No results for input(s): "LIPASE", "AMYLASE" in the last 72 hours. CBC: Recent Labs    07/27/22 1212  WBC 8.0  NEUTROABS 6.5  HGB 11.1*  HCT 33.2*  MCV 77.8*  PLT 166   Cardiac Enzymes: Recent Labs    07/27/22 1212 07/27/22 1352  TROPONINIHS 304* 413*   BNP: Recent Labs    07/27/22 1212  BNP >4,500.0*   D-Dimer: No results for input(s): "DDIMER" in the last 72 hours. Hemoglobin A1C: No results for input(s): "HGBA1C" in the last 72 hours. Fasting Lipid Panel: No results for input(s): "CHOL", "HDL", "LDLCALC", "TRIG", "CHOLHDL", "LDLDIRECT" in the last 72 hours. Thyroid Function Tests: No results for input(s): "TSH", "T4TOTAL", "T3FREE", "THYROIDAB" in the last 72 hours.  Invalid input(s): "FREET3" Anemia Panel: No results for input(s): "VITAMINB12", "FOLATE", "FERRITIN", "TIBC", "IRON", "RETICCTPCT" in the last 72 hours.   Radiology: DG Chest 2 View  Result Date: 07/27/2022 CLINICAL DATA:  Shortness of breath. EXAM: CHEST - 2 VIEW COMPARISON:  April 03, 2022. FINDINGS: Stable cardiomegaly. Mild bibasilar subsegmental atelectasis or possibly edema is noted with small bilateral pleural effusions. Bony thorax is unremarkable. IMPRESSION: Mild bibasilar subsegmental atelectasis or possibly edema is noted with small bilateral pleural effusions. Electronically Signed   By: Marijo Conception M.D.   On: 07/27/2022 12:49    05/14/2022 Lexiscan myoview Impression   Abnormal myocardial perfusion scan there is left ventricular enlargement there is poor tracer uptake overall ejection fraction is globally depressed at 20 to 25% there is a large apical  inferior persistent defect consistent with infarct there is minimal border zone redistribution.  This study is consistent with nonischemic cardiomyopathy consider further evaluation  possibly cardiac cath  Narrative  Table formatting from the original result was not included. [facility] [place], [place], [place]  [address] [phone]  Procedure: Pharmacologic Myocardial Perfusion Imaging   ONE day procedure  Indication: New onset of congestive heart failure (CMS-HCC) Plan: NM myocardial perfusion SPECT multiple (stress       and rest), ECG stress test only  Ordering Physician:  Dr. Lujean Amel  Clinical History: 88 y.o. year old female recent anginal symptoms Vitals: Height: 65 in Weight: 119 lb Cardiac risk factors include:    CHF, Hyperlipidemia, Previous MI, Diabetes, HTN, and CAD  Procedure:  Pharmacologic stress testing was performed with Regadenoson using a single use 0.4mg /61ml (0.08 mg/ml) prefilled syringe intravenously infused as a bolus dose over 10-15 seconds. The stress test was stopped due to Infusion completion.  Blood pressure response was normal. The patient did not develop any symptoms other than fatigue during the procedure.  Rest HR: 67bpm Rest BP: 100/14mmHg Max HR: 90bpm Min BP: 120/45mmHg  Stress Test Administered by: Oswald Hillock, CMA  ECG Interpretation: Rest ECG:  normal sinus rhythm,  LVH diffuse nonspecific ST-T wave Stress ECG:  normal sinus rhythm, Recovery ECG:  normal sinus rhythm ECG Interpretation:  non-diagnostic due to pharmacologic testing.  Gated LV Analysis:  Summary of LV Perfusion: Abnormalinfarction,  Summary of LV Function: Abnormal   TID Ratio: 1.03  LVEF= 20-25%%  FINDINGS: Regional wall motion:  demonstrates  hypokinesis of the global walls . The overall quality of the study is fair.   Artifacts noted: no Left ventricular cavity:  enlarged.  ECHO limited 04/05/2022 1. Left ventricular ejection fraction, by estimation, is 30 to 35%. The  left ventricle has moderately decreased function. The left ventricle  demonstrates global hypokinesis. The left ventricular internal cavity size  was moderately dilated. Left  ventricular diastolic function could not be evaluated.   2. The right ventricular size is normal. Mildly increased right  ventricular wall thickness.   3. The mitral valve is normal in structure. Mild mitral valve  regurgitation.   4. The aortic valve is grossly normal. Aortic valve regurgitation is not  visualized. Aortic valve sclerosis/calcification is present, without any  evidence of aortic stenosis.   Conclusion(s)/Recommendation(s): Poor windows for evaluation of left  ventricular function by transthoracic echocardiography. Would recommend an  alternative means of evaluation.   TELEMETRY reviewed by me (LT) 07/27/2022 : Sinus rhythm rate 70s to 80s with PVCs and PACs  EKG reviewed by me: Sinus rhythm, PACs, rate 72 bpm  Data reviewed by me (LT) 07/27/2022: Cardiology notes from 08/03/2022 and 06/03/2022, nephrology note from 06/2022 ED note, CBC BMP BNP troponins vitals telemetry  Principal Problem:   Paroxysmal nocturnal dyspnea Active Problems:   Hyperlipidemia   Hypertension   Gastro-esophageal reflux disease without esophagitis   Type 2 diabetes mellitus with hyperlipidemia ([place])   Depression with anxiety   Elevated troponin   Acute exacerbation of CHF (congestive heart failure) ([place])    ASSESSMENT AND PLAN:  Jordan Thornton is a 89yoF with a PMH of HFrEF (30-35%, LVH, g1dd), CAD (NSTEMI 03/2022, treated medically), CKD 4 (baseline Cr 1.98-2.2), DM2, HTN, HLD who presented to [facility] ED 07/27/2022 with worsening shortness of breath over the past 3-4 days with associated PND.  Cardiology is consulted for further assistance with her heart failure.  #Acute on chronic HFrEF (EF 30-35%  03/2022) Presents with a several day history of worsening shortness of breath,  and associated PND starting 2 days ago.  BNP grossly elevated at >4500, chest x-ray with small bilateral pleural effusions and possible pulmonary edema.  She is on room air and JVD on exam does not otherwise appear significantly clinically volume overloaded despite her markedly abnormal BNP.  She ate pot roast for dinner last night, query if this could have been somewhat contributory. -S/p IV Lasix 40 mg x 1, agree with 2 more doses for now with a recheck of her bmp in the morning -Continue GDMT with metoprolol XL 12.5mg  daily, hydralazine 10mg  TID and imdur 15mg  daily  -defer ACEi/ARB/entresto, MRA with renal insufficiency -Strict I/O, salt & fluid restriction.   #CKD 4 BUN/creatinine on admission 50/2.44 and GFR of 18, this appears to be slightly worse than her baseline, watch closely with diuresis  #CAD s/p recent NSTEMI 03/2022, medically managed #Elevated troponin Troponins elevated at 304-413, without acute ST or T wave changes on EKG, with nonspecific chest heaviness earlier today, that resolved with IV Lasix.  This likely is most consistent with demand/supply mismatch and not ACS.  She had an abnormal Lexiscan Myoview in September that showed a large apical/inferior persistent defect. Discussed with patient today regarding a consideration for further ischemic work-up at some point with an LHC, she is from her wishes to never go on dialysis, which she would certainly be at risk for should she have a LHC done.  She strongly prefers to continue medical management, which is certainly reasonable. -Continue aspirin and Plavix daily to complete 6 months (January 2024), ideally longer -Continue atorvastatin 40 mg daily  This patient's plan of care was discussed and created with Dr. Clayborn Bigness and he is in agreement.  Signed: Tristan Schroeder , PA-C 07/27/2022, 3:24 PM [facility] Cardiology

## 2022-07-27 NOTE — Assessment & Plan Note (Addendum)
-   Resumed home lorazepam 0.5 mg p.o. every 6 hours as needed for anxiety

## 2022-07-27 NOTE — Assessment & Plan Note (Addendum)
And exertional dyspnea - With markedly elevated BNP and elevated high sensitive troponin - I suspect this is secondary to heart failure exacerbation - Status post furosemide 40 mg IV per EDP - I have ordered furosemide 40 mg IV twice daily, 2 doses, starting on 07/28/2022 - Cardiology has been consulted for heart failure optimization, Dr. Clayborn Bigness and his team are aware - Most recent echo was done on 04/05/2022 and was read as estimated ejection fraction of 30 to 35%, left ventricular global hypokinesis - No additional echo ordered on admission - Admit to telemetry cardiac, inpatient

## 2022-07-27 NOTE — Assessment & Plan Note (Addendum)
-   Insulin SSI with at bedtime coverage ordered, CBG stable

## 2022-07-27 NOTE — ED Triage Notes (Signed)
Pt to via POV for shortness of breath that is worse at night when she tries to lay down. Pt states that she has hx/o CHF. Pt denies chest pain. Pt states that she was started on plavix but is unsure why, pt states that she has not felt good since starting this medication. Pt is able to speak in complete sentences and is in NAD.

## 2022-07-27 NOTE — ED Notes (Signed)
Admitting MD at bedside.

## 2022-07-27 NOTE — Hospital Course (Addendum)
[name] 88 year old female with history of heart failure reduced ejection fraction, hypertension, hyperlipidemia, anxiety, depression, non-insulin-dependent diabetes mellitus, who presents emergency department for chief concerns of shortness of breath when laying down at night.  Patient endorses compliance with home furosemide.  Initial vitals in the emergency department showed temperature of 97.6, respiration rate of 16, heart rate 98, blood pressure 140/80, SPO2 100% on room air.  Serum sodium is 142, potassium 4.5, chloride 111, bicarb 21, BUN of 50, serum creatinine of 2.44, eGFR of 18, nonfasting blood glucose 139, GFR of 18, WBC 8.0, hemoglobin 11.1, platelets of 166.  BNP was elevated greater than 4500.  High sensitive troponin was 304 and on recheck was 413.  ED treatment: Aspirin 324 mg p.o. one-time dose, furosemide 40 mg IV.

## 2022-07-27 NOTE — H&P (Addendum)
History and Physical   Jordan Thornton IRS:[id number] DOB: 01-Apr-1930 DOA: 07/27/2022  PCP: Juline Patch, MD  Outpatient Specialists: Dr. Holley Raring, nephrology Patient coming from: Home via [place]  I have personally briefly reviewed patient's old medical records in [place].  Chief Concern: Shortness of breath when laying down  HPI: [name] 88 year old female with history of heart failure reduced ejection fraction, hypertension, hyperlipidemia, anxiety, depression, non-insulin-dependent diabetes mellitus, who presents emergency department for chief concerns of shortness of breath when laying down at night.  Patient endorses compliance with home furosemide.  Initial vitals in the emergency department showed temperature of 97.6, respiration rate of 16, heart rate 98, blood pressure 140/80, SPO2 100% on room air.  Serum sodium is 142, potassium 4.5, chloride 111, bicarb 21, BUN of 50, serum creatinine of 2.44, eGFR of 18, nonfasting blood glucose 139, GFR of 18, WBC 8.0, hemoglobin 11.1, platelets of 166.  BNP was elevated greater than 4500.  High sensitive troponin was 304 and on recheck was 413.  ED treatment: Aspirin 324 mg p.o. one-time dose, furosemide 40 mg IV.  At bedside, she is able to tell me her name, age, current calendar year, and current location is hospital. She is able to identify her [employer] [employer] at bedside.   She reports the shortness of breath with laying down started yesterday evening.  Furthermore she also reports increasing shortness of breath when walking around her home which is new for her.  She denies eating salt. She does not use salt in her food if she can help.  She endorses clients with her home medication.  She reports that she took her Imdur prior to coming to the emergency department.  She denies chest pain, abdominal pain, dysuria, hematuria, diarrhea swelling of her lower extremities.  She denies trauma to her person.  Social  history: She lives at home with her husband [employer] Nails.  She denies current tobacco and EtOH use.  She last drink about 40 to 45 years ago.  She denies recreational drug use.  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  ROS: Constitutional: no weight change, no fever ENT/Mouth: no sore throat, no rhinorrhea Eyes: no eye pain, no vision changes Cardiovascular: no chest pain, + dyspnea with exertion and when laying flat no edema, no palpitations Respiratory: no cough, no sputum, no wheezing Gastrointestinal: no nausea, no vomiting, no diarrhea, no constipation Genitourinary: no urinary incontinence, no dysuria, no hematuria Musculoskeletal: no arthralgias, no myalgias Skin: no skin lesions, no pruritus, Neuro: no weakness, no loss of consciousness, no syncope Psych: no anxiety, no depression, no decrease appetite Heme/Lymph: no bruising, no bleeding  ED Course: Discussed with emergency medicine provider, patient requiring hospitalization for chief concerns of heart failure exacerbation.  Assessment/Plan  Principal Problem:   Paroxysmal nocturnal dyspnea Active Problems:   Elevated troponin   Acute exacerbation of CHF (congestive heart failure) (HCC)   Hypertension   Hyperlipidemia   Type 2 diabetes mellitus with hyperlipidemia (HCC)   Depression with anxiety   Gastro-esophageal reflux disease without esophagitis   H/O total hysterectomy with bilateral salpingo-oophorectomy (BSO)   Assessment and Plan:  * Paroxysmal nocturnal dyspnea And exertional dyspnea - With markedly elevated BNP and elevated high sensitive troponin - I suspect this is secondary to heart failure exacerbation - Status post furosemide 40 mg IV per EDP - I have ordered furosemide 40 mg IV twice daily, 2 doses, starting on 07/28/2022 -  Cardiology has been consulted for heart failure optimization, Dr. Clayborn Bigness and his team are aware - Most recent echo was done on  04/05/2022 and was read as estimated ejection fraction of 30 to 35%, left ventricular global hypokinesis - No additional echo ordered on admission - Admit to telemetry cardiac, inpatient  Elevated troponin - Patient had peak high sensitive troponin of 1597 in July 2023 - Patient currently denies chest pain - No ischemic changes on EKG - Heparin GTT initiated - Discussed the above with nursing staff  Acute exacerbation of CHF (congestive heart failure) ([place]) - Strict I's and O's - Recent echo on 03/2022 was read as estimated ejection fraction 30 to 35% - Status post furosemide 40 mg IV one-time dose per EDP - I ordered additional furosemide 20 mg IV one-time dose scheduled for 2000-hour  Hypertension - Metoprolol succinate 12.5 mg nightly resumed - Labetalol 5 mg IV every 3 hours as needed for SBP greater than 180, 4 doses of  Hyperlipidemia - Atorvastatin 40 mg nightly resumed  Type 2 diabetes mellitus with hyperlipidemia (HCC) - Insulin SSI with at bedtime coverage ordered  Depression with anxiety - Resumed home lorazepam 0.5 mg p.o. every 6 hours as needed for anxiety  Chart reviewed.   Hospitalization from 04/03/2022 to 04/06/2022: Patient was admitted for NSTEMI and acute on chronic systolic heart failure.  Patient was diuresed with IV Lasix.  Troponin peaked at 1597.  She was started on heparin drip for 2 days.  Plavix and metoprolol succinate were added at that time.  DVT prophylaxis: Heparin 5000 units subcutaneous every 8 hours Code Status: Full code Diet: Heart healthy Family Communication: Updated spouse at bedside with patient's permission Disposition Plan: Pending clinical course Consults called: Cardiology Admission status: Telemetry cardiac, inpatient  Past Medical History:  Diagnosis Date   CHF (congestive heart failure) ([place])    Diabetes mellitus without complication ([place])    GERD (gastroesophageal reflux disease)    Hyperlipidemia    Hypertension    Past  Surgical History:  Procedure Laterality Date   bladder tack     BREAST BIOPSY Left 10-15 yrs ago   benign   CATARACT EXTRACTION Bilateral 05/2015   CHOLECYSTECTOMY OPEN     VAGINAL HYSTERECTOMY     Social History:  reports that she quit smoking about 41 years ago. Her smoking use included cigarettes. She has a 6.25 pack-year smoking history. She has never used smokeless tobacco. She reports that she does not drink alcohol and does not use drugs.  Allergies  Allergen Reactions   Sertraline     Made her more depressed   Family History  Problem Relation Age of Onset   Heart disease Maternal Aunt    Heart disease Maternal Grandmother    Stroke Father    Breast cancer Neg Hx    Family history: Family history reviewed and not pertinent  Prior to Admission medications   Medication Sig Start Date End Date Taking? Authorizing Provider  Acetaminophen (TYLENOL ARTHRITIS PAIN PO) Take 2 tablets by mouth daily as needed.     [provider]  ADVOCATE LANCETS MISC use once daily as directed to test blood sugar 04/24/15   Juline Patch, MD  aspirin EC 81 MG tablet Take 1 tablet (81 mg total) by mouth daily. Swallow whole. 02/02/22   Lorella Nimrod, MD  atorvastatin (LIPITOR) 40 MG tablet Take 1 tablet (40 mg total) by mouth every evening. 04/06/22   Loletha Grayer, MD  Blood Glucose Monitoring  Suppl (FREESTYLE LITE) DEVI use once daily as directed to test blood sugar 04/24/15   Juline Patch, MD  clopidogrel (PLAVIX) 75 MG tablet Take 1 tablet (75 mg total) by mouth daily. 04/07/22   Loletha Grayer, MD  Elderberry 575 MG/5ML SYRP Take 1 Dose by mouth daily.     [provider]  FREESTYLE LITE test strip USE 1 STRIP TO CHECK GLUCOSE ONCE DAILY AS DIRECTED 11/26/21   Juline Patch, MD  furosemide (LASIX) 20 MG tablet Take 1 tablet (20 mg total) by mouth daily. Patient taking differently: Take 20 mg by mouth daily. Taking 1/2 tablet daily 04/06/22   Loletha Grayer, MD   JANUVIA 25 MG tablet Take 1 tablet (25 mg total) by mouth daily. 03/02/22   Juline Patch, MD  Lancet Devices (ADVOCATE LANCING DEVICE) MISC use once daily as directed to test blood sugar 04/24/15   Juline Patch, MD  lansoprazole (PREVACID) 15 MG capsule Take 1 capsule by mouth once daily Patient taking differently: Take 15 mg by mouth daily at 12 noon. otc 06/23/21   Juline Patch, MD  LORazepam (ATIVAN) 0.5 MG tablet Take 1 tablet (0.5 mg total) by mouth as needed. Per 30 days 01/27/22   Juline Patch, MD  metoprolol succinate (TOPROL-XL) 25 MG 24 hr tablet Take 0.5 tablets (12.5 mg total) by mouth at bedtime. 04/06/22   Loletha Grayer, MD  Omega-3 Fatty Acids (FISH OIL) 1000 MG CAPS Take 1 capsule (1,000 mg total) by mouth daily. 11/17/19   Juline Patch, MD   Physical Exam: Vitals:   07/27/22 1354 07/27/22 1400 07/27/22 1500 07/27/22 1530  BP:  128/71 125/85 121/75  Pulse:  (!) 55 67 66  Resp:  (!) _0 Temp:      TempSrc:      SpO2:  100% 100% 97%  Weight: 55.2 kg     Height: _1  (1.651 m)      Constitutional: appears younger than chronological age, NAD, calm, comfortable Eyes: PERRL, lids and conjunctivae normal ENMT: Mucous membranes are moist. Posterior pharynx clear of any exudate or lesions. Age-appropriate dentition. Hearing appropriate Neck: normal, supple, no masses, no thyromegaly Respiratory: clear to auscultation bilaterally, no wheezing, no crackles. Normal respiratory effort. No accessory muscle use.  Cardiovascular: Regular rate and rhythm, no murmurs / rubs / gallops. No extremity edema. 2+ pedal pulses. No carotid bruits.  Abdomen: no tenderness, no masses palpated, no hepatosplenomegaly. Bowel sounds positive.  Musculoskeletal: no clubbing / cyanosis. No joint deformity upper and lower extremities. Good ROM, no contractures, no atrophy. Normal muscle tone.  Skin: no rashes, lesions, ulcers. No induration Neurologic: Sensation intact. Strength 5/5 in  all 4.  Psychiatric: Normal judgment and insight. Alert and oriented x 3. Normal mood.   EKG: independently reviewed, showing sinus rhythm with rate of 72, QTc 413  Chest x-ray on Admission: I personally reviewed and I agree with radiologist reading as below.  DG Chest 2 View  Result Date: 07/27/2022 CLINICAL DATA:  Shortness of breath. EXAM: CHEST - 2 VIEW COMPARISON:  April 03, 2022. FINDINGS: Stable cardiomegaly. Mild bibasilar subsegmental atelectasis or possibly edema is noted with small bilateral pleural effusions. Bony thorax is unremarkable. IMPRESSION: Mild bibasilar subsegmental atelectasis or possibly edema is noted with small bilateral pleural effusions. Electronically Signed   By: Marijo Conception M.D.   On: 07/27/2022 12:49    Labs on Admission: I have personally reviewed following labs  CBC:  Recent Labs  Lab 07/27/22 1212  WBC 8.0  NEUTROABS 6.5  HGB 11.1*  HCT 33.2*  MCV 77.8*  PLT 282   Basic Metabolic Panel: Recent Labs  Lab 07/27/22 1212  NA 142  K 4.5  CL 111  CO2 21*  GLUCOSE 139*  BUN 50*  CREATININE 2.44*  CALCIUM 9.4   GFR: Estimated Creatinine Clearance: 13.4 mL/min (A) (by C-G formula based on SCr of 2.44 mg/dL (H)).  Urine analysis:    Component Value Date/Time   COLORURINE YELLOW (A) 01/31/2022 1209   APPEARANCEUR HAZY (A) 01/31/2022 1209   LABSPEC 1.008 01/31/2022 1209   PHURINE 6.0 01/31/2022 1209   GLUCOSEU NEGATIVE 01/31/2022 1209   HGBUR NEGATIVE 01/31/2022 1209   BILIRUBINUR NEGATIVE 01/31/2022 1209   BILIRUBINUR negative 06/17/2020 [place] 01/31/2022 1209   PROTEINUR NEGATIVE 01/31/2022 1209   UROBILINOGEN 0.2 06/17/2020 1159   NITRITE NEGATIVE 01/31/2022 1209   LEUKOCYTESUR SMALL (A) 01/31/2022 1209   Dr. Tobie Poet [facility]  If 7PM-7AM, please contact overnight-coverage provider If 7AM-7PM, please contact day coverage provider [email or web address]  07/27/2022, 4:40 PM

## 2022-07-27 NOTE — ED Triage Notes (Signed)
First Nurse Note:  Arrives with C/O SOB.     AAOx3.  Skin warm and dry. No SOB/ DOE. NAD

## 2022-07-27 NOTE — Consult Note (Signed)
Jordan Thornton for Heparin Indication:  NSTEMI  Allergies  Allergen Reactions   Sertraline     Made her more depressed    Patient Measurements: Height: 5\' 5"  (165.1 cm) Weight: 55.2 kg (121 lb 11.2 oz) IBW/kg (Calculated) : 57 Heparin Dosing Weight: 55.2 kg  Vital Signs: Temp: 97.6 F (36.4 C) (11/13 1213) Temp Source: Oral (11/13 1213) BP: 98/70 (11/13 1730) Pulse Rate: 90 (11/13 1730)  Labs: Recent Labs    07/27/22 1212 07/27/22 1352 07/27/22 1722  HGB 11.1*  --   --   HCT 33.2*  --   --   PLT 166  --   --   APTT  --   --  32  LABPROT  --   --  15.0  INR  --   --  1.2  CREATININE 2.44*  --   --   TROPONINIHS 304* 413*  --     Estimated Creatinine Clearance: 13.4 mL/min (A) (by C-G formula based on SCr of 2.44 mg/dL (H)).   Medical History: Past Medical History:  Diagnosis Date   CHF (congestive heart failure) (HCC)    Diabetes mellitus without complication (HCC)    GERD (gastroesophageal reflux disease)    Hyperlipidemia    Hypertension     Medications:  No history of chronic anticoagulant use PTA  Assessment: Pharmacy has been consulted to initiate and titrate heparin infusion in 88yo female with PMH of heart failure, HTN, HLD, anxiety and depression who presented to the ED with chief concerns of shortness of breath. Troponin levels of 304>413. Baseline labs have been ordered and are pending.   Goal of Therapy:  Heparin level 0.3-0.7 units/ml Monitor platelets by anticoagulation protocol: Yes   Plan:  Give 3300 units bolus x 1 Start heparin infusion at 700 units/hr Check anti-Xa level in 8 hours and daily while on heparin Continue to monitor H&H and platelets  Desani Sprung A Zakry Caso 07/27/2022,6:00 PM

## 2022-07-27 NOTE — ED Provider Notes (Signed)
[facility] Provider Note    Event Date/Time   First MD Initiated Contact with Patient 07/27/22 1304     (approximate)   History   Shortness of Breath   HPI  [place] McCoy Brander is a 88 y.o. female past medical history of heart failure with EF 30 to 35%, diabetes, GERD, hypertension hyperlipidemia presents shortness of breath.  Patient tells me that she has been feeling more short of breath over the last 3 to 4 days.  Specially at night has to sit up.  She had an episode of presyncope today after going to the bathroom got up from the toilet and felt like she was going to pass out but did not actually fall.  No associated nausea vomiting or diaphoresis.  Denies lower extremity swelling.  Has been compliant with her medications.  Denies cough fevers or chills.  Patient saw Dr. Clayborn Bigness last about a week ago.     Patient Active Problem List   Diagnosis Date Noted   Acute kidney injury superimposed on CKD ([facility]) 04/06/2022   Acute on chronic systolic CHF (congestive heart failure) ([place]) 04/04/2022   NSTEMI (non-ST elevated myocardial infarction) ([place]) 04/04/2022   Hypotension 04/04/2022   Abnormal chest x-ray    Syncope 01/31/2022   Type 2 diabetes mellitus with hyperlipidemia ([place]) 01/31/2022   Depression with anxiety 01/31/2022   Chronic diastolic CHF (congestive heart failure) ([place]) 01/31/2022   Urinary tract infection, acute 01/31/2022   Elevated troponin 01/31/2022   H/O total hysterectomy with bilateral salpingo-oophorectomy (BSO) 10/29/2020   Anxiety 10/29/2020   Acute hypoxemic respiratory failure due to COVID-19 ([place]) 09/19/2019   Stage 3 chronic kidney disease ([place]) 09/02/2019   Diabetes mellitus with no complication ([place]) 66/59/9357   Familial multiple lipoprotein-type hyperlipidemia 01/03/2015   Creatinine elevation 01/03/2015   Diabetes ([place]) 12/15/2014   Hyperlipidemia 12/15/2014   Hypertension 12/15/2014   Gastro-esophageal reflux  disease without esophagitis 12/15/2014     Physical Exam  Triage Vital Signs: ED Triage Vitals  Enc Vitals Group     BP 07/27/22 1212 (!) 140/80     Pulse Rate 07/27/22 1210 98     Resp 07/27/22 1210 16     Temp 07/27/22 1213 97.6 F (36.4 C)     Temp Source 07/27/22 1213 Oral     SpO2 07/27/22 1210 100 %     Weight --      Height --      Head Circumference --      Peak Flow --      Pain Score 07/27/22 1210 0     Pain Loc --      Pain Edu? --      Excl. in [place]? --     Most recent vital signs: Vitals:   07/27/22 1212 07/27/22 1213  BP: (!) 140/80   Pulse:    Resp:    Temp:  97.6 F (36.4 C)  SpO2:       General: Awake, no distress.  CV:  Good peripheral perfusion.  Resp:  Normal effort. Lungs are clear, no resp distress  Abd:  No distention.  Neuro:             Awake, Alert, Oriented x 3  Other:  Trace pitting edema  ED Results / Procedures / Treatments  Labs (all labs ordered are listed, but only abnormal results are displayed) Labs Reviewed  CBC WITH DIFFERENTIAL/PLATELET - Abnormal; Notable for the following components:  Result Value   Hemoglobin 11.1 (*)    HCT 33.2 (*)    MCV 77.8 (*)    RDW 16.6 (*)    All other components within normal limits  BRAIN NATRIURETIC PEPTIDE - Abnormal; Notable for the following components:   B Natriuretic Peptide >4,500.0 (*)    All other components within normal limits  BASIC METABOLIC PANEL - Abnormal; Notable for the following components:   CO2 21 (*)    Glucose, Bld 139 (*)    BUN 50 (*)    Creatinine, Ser 2.44 (*)    GFR, Estimated 18 (*)    All other components within normal limits  TROPONIN I (HIGH SENSITIVITY) - Abnormal; Notable for the following components:   Troponin I (High Sensitivity) 304 (*)    All other components within normal limits     EKG  EKG shows sinus rhythm with left axis left anterior fascicular block, ACs and PACs no acute ischemic changes  RADIOLOGY Chest x-ray reviewed  interpreted myself shows bibasilar effusions   PROCEDURES:  Critical Care performed: Yes, see critical care procedure note(s)  Procedures  The patient is on the cardiac monitor to evaluate for evidence of arrhythmia and/or significant heart rate changes.   MEDICATIONS ORDERED IN ED: Medications  aspirin chewable tablet 324 mg (has no administration in time range)  furosemide (LASIX) injection 40 mg (has no administration in time range)     IMPRESSION / MDM / ASSESSMENT AND PLAN / ED COURSE  I reviewed the triage vital signs and the nursing notes.                              Patient's presentation is most consistent with acute presentation with potential threat to life or bodily function.  Differential diagnosis includes, but is not limited to, CHF exacerbation, acute coronary syndrome, pulmonary embolism, pleural effusion, pneumonia  The patient is a 88 year old female with HFrEF presenting with shortness of breath.  This been going on for several days primarily with exertion and with laying flat.  She denies any significant lower extremity swelling she is not having chest pain.  Did have an episode of presyncope today.  Here she is not hypoxic blood pressure is mildly elevated but vitals are largely reassuring.  She does have some trace pitting edema but lungs are clear she is not in respiratory distress.  EKG showing significant ectopy no obvious ischemic changes.  Troponin is elevated at 300 BNP over 4500.  Chest x-ray with some small pleural effusions not a ton of pulmonary edema.  With the elevated troponin patient will require admission,   Will give aspirin will defer heparin at this time she is not having any current symptoms.  Suspect this is demand in the setting for heart failure.  Will trend.       FINAL CLINICAL IMPRESSION(S) / ED DIAGNOSES   Final diagnoses:  Acute on chronic congestive heart failure, unspecified heart failure type ([place])     Rx / DC Orders    ED Discharge Orders     None        Note:  This document was prepared using Dragon voice recognition software and may include unintentional dictation errors.   Rada Hay, MD 07/27/22 [phone]

## 2022-07-27 NOTE — Assessment & Plan Note (Signed)
-   Strict I's and O's - Recent echo on 03/2022 was read as estimated ejection fraction 30 to 35% - Status post furosemide 40 mg IV one-time dose per EDP - I ordered additional furosemide 20 mg IV one-time dose scheduled for 2000-hour

## 2022-07-27 NOTE — Assessment & Plan Note (Addendum)
Elevated troponins in the setting of acute heart failure plus chronic renal failure.  Peaked at 900 and trending downward.  Cardiology following.  No evidence of acute ischemia at this time.

## 2022-07-27 NOTE — ED Notes (Signed)
Pt set up with purewick for urine output - pt verbalizes no needs at this time and reports her chest already feels lighter after medication - monitors remain in place. Son at bedside.

## 2022-07-27 NOTE — ED Notes (Signed)
Report to [place], Therapist, sports

## 2022-07-27 NOTE — Assessment & Plan Note (Addendum)
-   Metoprolol succinate 12.5 mg nightly resumed, blood pressure stable

## 2022-07-28 ENCOUNTER — Inpatient Hospital Stay
Admit: 2022-07-28 | Discharge: 2022-07-28 | Disposition: A | Discharge status: Home / Self Care | Payer: PPO | Attending: Cardiology | Admitting: Cardiology

## 2022-07-28 DIAGNOSIS — I5023 Acute on chronic systolic (congestive) heart failure: Secondary | ICD-10-CM

## 2022-07-28 DIAGNOSIS — D631 Anemia in chronic kidney disease: Secondary | ICD-10-CM | POA: Diagnosis not present

## 2022-07-28 DIAGNOSIS — N184 Chronic kidney disease, stage 4 (severe): Secondary | ICD-10-CM

## 2022-07-28 DIAGNOSIS — R7989 Other specified abnormal findings of blood chemistry: Secondary | ICD-10-CM | POA: Diagnosis not present

## 2022-07-28 LAB — ECHOCARDIOGRAM COMPLETE
AR max vel: 1.82 cm2
AV Area VTI: 1.91 cm2
AV Area mean vel: 1.82 cm2
AV Mean grad: 2 mmHg
AV Peak grad: 2.9 mmHg
Ao pk vel: 0.86 m/s
Area-P 1/2: 7.66 cm2
Calc EF: 11 %
Height: 65 in
S' Lateral: 4.3 cm
Single Plane A2C EF: 18.9 %
Single Plane A4C EF: 6 %
Weight: 1947.2 oz

## 2022-07-28 LAB — CBG MONITORING, ED
Glucose-Capillary: 101 mg/dL — ABNORMAL HIGH (ref 70–99)
Glucose-Capillary: 124 mg/dL — ABNORMAL HIGH (ref 70–99)

## 2022-07-28 LAB — BASIC METABOLIC PANEL
Anion gap: 10 (ref 5–15)
BUN: 51 mg/dL — ABNORMAL HIGH (ref 8–23)
CO2: 23 mmol/L (ref 22–32)
Calcium: 9 mg/dL (ref 8.9–10.3)
Chloride: 109 mmol/L (ref 98–111)
Creatinine, Ser: 2.3 mg/dL — ABNORMAL HIGH (ref 0.44–1.00)
GFR, Estimated: 20 mL/min — ABNORMAL LOW (ref >60)
Glucose, Bld: 102 mg/dL — ABNORMAL HIGH (ref 70–99)
Potassium: 3.7 mmol/L (ref 3.5–5.1)
Sodium: 142 mmol/L (ref 135–145)

## 2022-07-28 LAB — HEPARIN LEVEL (UNFRACTIONATED)
Heparin Unfractionated: 0.5 IU/mL (ref 0.30–0.70)
Heparin Unfractionated: 0.37 IU/mL (ref 0.30–0.70)

## 2022-07-28 LAB — CBC
HCT: 30.3 % — ABNORMAL LOW (ref 36.0–46.0)
Hemoglobin: 10.3 g/dL — ABNORMAL LOW (ref 12.0–15.0)
MCH: 26.1 pg (ref 26.0–34.0)
MCHC: 34 g/dL (ref 30.0–36.0)
MCV: 76.9 fL — ABNORMAL LOW (ref 80.0–100.0)
Platelets: 161 10*3/uL (ref 150–400)
RBC: 3.94 MIL/uL (ref 3.87–5.11)
RDW: 16.4 % — ABNORMAL HIGH (ref 11.5–15.5)
WBC: 6.9 10*3/uL (ref 4.0–10.5)
nRBC: 0 % (ref 0.0–0.2)

## 2022-07-28 LAB — TROPONIN I (HIGH SENSITIVITY): Troponin I (High Sensitivity): 708 ng/L (ref <18)

## 2022-07-28 LAB — GLUCOSE, CAPILLARY
Glucose-Capillary: 107 mg/dL — ABNORMAL HIGH (ref 70–99)
Glucose-Capillary: 119 mg/dL — ABNORMAL HIGH (ref 70–99)

## 2022-07-28 MED ORDER — ADULT MULTIVITAMIN W/MINERALS CH
1.0000 | ORAL_TABLET | Freq: Every day | ORAL | Status: DC
  Administered 2022-07-29 – 2022-07-30 (×2): 1 via ORAL
  Filled 2022-07-28 (×2): qty 1

## 2022-07-28 MED ORDER — ENSURE ENLIVE PO LIQD
237.0000 mL | Freq: Two times a day (BID) | ORAL | Status: DC
  Administered 2022-07-29 – 2022-07-30 (×2): 237 mL via ORAL

## 2022-07-28 NOTE — Assessment & Plan Note (Signed)
Hemoglobin stable 

## 2022-07-28 NOTE — Progress Notes (Signed)
[facility] Progress Note  Patient: Jordan Thornton    EXB:[id number]  DOA: 07/27/2022    Date of Service: the patient was seen and examined on 07/28/2022  Brief hospital course: 88 year old female with history of systolic heart failure of 30 to 35%, stage IV CKD, diabetes mellitus, and hypertension who presented to the emergency room on 11/13 with complaints of orthopnea.  BNP greater than 4500.  Surprising, patient not hypoxic.  Started on IV Lasix and cardiology was consulted.  Initial troponin of 300 and peaked as high as 900 by evening of 11/13.  Patient admitted to the hospitalist service.    Assessment and Plan: Assessment and Plan: * Acute on chronic systolic CHF (congestive heart failure) ([place]) Appreciate cardiology help.  Repeat echocardiogram results pending.  Responding well to Lasix and has diuresed almost 3 L.  Not hypoxic.  Elevated troponin Elevated troponins in the setting of acute heart failure plus chronic renal failure.  Peaked at 900 and trending downward.  Cardiology following.  No evidence of acute ischemia at this time.  CKD (chronic kidney disease), stage IV ([place]) Appears to be at baseline.  With diuresis, improvement in creatinine.  Hypertension - Metoprolol succinate 12.5 mg nightly resumed, blood pressure stable  Type 2 diabetes mellitus with hyperlipidemia (HCC) - Insulin SSI with at bedtime coverage ordered, CBG stable  Anemia due to chronic kidney disease Hemoglobin stable  Depression with anxiety - Resumed home lorazepam 0.5 mg p.o. every 6 hours as needed for anxiety  Hyperlipidemia - Atorvastatin 40 mg nightly resumed       Body mass index is 20.25 kg/m.  Nutrition Problem: Increased nutrient needs Etiology: chronic illness (CHF)     Consultants: Cardiology  Procedures: Echocardiogram-results pending  Antimicrobials: None  Code Status: Full code   Subjective: Patient with no complaints, feeling a little better,  breathing little easier  Objective: Vital signs were reviewed and unremarkable. Vitals:   07/28/22 0714 07/28/22 1321  BP:  101/70  Pulse:  77  Resp:  16  Temp: (!) 97.5 F (36.4 C) (!) 97.5 F (36.4 C)  SpO2:  100%    Intake/Output Summary (Last 24 hours) at 07/28/2022 1534 Last data filed at 07/28/2022 0800 Gross per 24 hour  Intake --  Output 2750 ml  Net -2750 ml   Filed Weights   07/27/22 1354  Weight: 55.2 kg   Body mass index is 20.25 kg/m.  Exam:  General: Alert and oriented x3, no acute distress HEENT: Normocephalic, atraumatic, mucous membranes are moist Cardiovascular: Regular rate and rhythm, S1-S2, 2 out of 6 systolic ejection murmur Respiratory: Clear to auscultation bilaterally, some mild decreased at bases Abdomen: Soft, nontender, nondistended, positive bowel sounds Musculoskeletal: No cyanosis, trace pitting edema Skin: No skin breaks, tears or lesions Psychiatry: Appropriate, no evidence of psychoses Neurology: No focal deficits  Data Reviewed: Creatinine down to 2.3, troponin down to 708, hemoglobin at 10.3  Disposition:  Status is: Inpatient Remains inpatient appropriate because:  -Continue diuresis    Anticipated discharge date: 11/16  Family Communication: Son at the bedside DVT Prophylaxis: Place TED hose Start: 07/27/22 1453    Author: Annita Brod ,MD 07/28/2022 3:34 PM  To reach On-call, see care teams to locate the attending and reach out via [email or web address]. Between 7PM-7AM, please contact night-coverage If you still have difficulty reaching the attending provider, please page the [facility] (Director on Call) for [facility] on amion for assistance.

## 2022-07-28 NOTE — Progress Notes (Signed)
*  PRELIMINARY RESULTS* Echocardiogram 2D Echocardiogram has been performed.  Jordan Thornton 07/28/2022, 11:20 AM

## 2022-07-28 NOTE — Consult Note (Signed)
ANTICOAGULATION CONSULT NOTE  Pharmacy Consult for Heparin Indication:  NSTEMI  Allergies  Allergen Reactions   Sertraline     Made her more depressed    Patient Measurements: Height: 5\' 5"  (165.1 cm) Weight: 55.2 kg (121 lb 11.2 oz) IBW/kg (Calculated) : 57 Heparin Dosing Weight: 55.2 kg  Vital Signs: Temp: 97.5 F (36.4 C) (11/14 0714) Temp Source: Oral (11/14 0713) BP: 129/75 (11/14 0700) Pulse Rate: 59 (11/14 0700)  Labs: Recent Labs    07/27/22 1212 07/27/22 1352 07/27/22 1722 07/27/22 2053 07/28/22 0153 07/28/22 1151  HGB 11.1*  --   --   --  10.3*  --   HCT 33.2*  --   --   --  30.3*  --   PLT 166  --   --   --  161  --   APTT  --   --  32  --   --   --   LABPROT  --   --  15.0  --   --   --   INR  --   --  1.2  --   --   --   HEPARINUNFRC  --   --   --   --  0.50 0.37  CREATININE 2.44*  --   --   --  2.30*  --   TROPONINIHS 304*   < > 727* 898* 708*  --    < > = values in this interval not displayed.     Estimated Creatinine Clearance: 14.2 mL/min (A) (by C-G formula based on SCr of 2.3 mg/dL (H)).   Medical History: Past Medical History:  Diagnosis Date   CHF (congestive heart failure) (HCC)    Diabetes mellitus without complication (HCC)    GERD (gastroesophageal reflux disease)    Hyperlipidemia    Hypertension     Medications:  No history of chronic anticoagulant use PTA  Assessment: Pharmacy has been consulted to initiate and titrate heparin infusion in 88yo female with PMH of heart failure, HTN, HLD, anxiety and depression who presented to the ED with chief concerns of shortness of breath. Troponin levels of 304>413. Baseline labs have been ordered and are pending.   Goal of Therapy:  Heparin level 0.3-0.7 units/ml Monitor platelets by anticoagulation protocol: Yes   Date Time HL Rate/Comment 11/14 0153  0.50 Therapeutic x 1 11/14 1151 0.37 Therapeutic x 2   Plan:  Continue heparin infusion at 700 units/hr Check anti-Xa level   daily with AM labs Continue to monitor H&H and platelets daily while on heparin gtt.   Thank you for allowing pharmacy to be a part of this patient's care.  Loving Pharmacist 07/28/2022 1:20 PM

## 2022-07-28 NOTE — Consult Note (Signed)
[place] for Heparin Indication:  NSTEMI  Allergies  Allergen Reactions   Sertraline     Made her more depressed    Patient Measurements: Height: 5\' 5"  (165.1 cm) Weight: 55.2 kg (121 lb 11.2 oz) IBW/kg (Calculated) : 57 Heparin Dosing Weight: 55.2 kg  Vital Signs: Temp: 98 F (36.7 C) (11/13 2048) Temp Source: Oral (11/13 2048) BP: 121/74 (11/14 0018) Pulse Rate: 65 (11/14 0018)  Labs: Recent Labs    07/27/22 1212 07/27/22 1352 07/27/22 1722 07/27/22 2053 07/28/22 0153  HGB 11.1*  --   --   --  10.3*  HCT 33.2*  --   --   --  30.3*  PLT 166  --   --   --  161  APTT  --   --  32  --   --   LABPROT  --   --  15.0  --   --   INR  --   --  1.2  --   --   HEPARINUNFRC  --   --   --   --  0.50  CREATININE 2.44*  --   --   --  2.30*  TROPONINIHS 304* 413* 727* 898*  --      Estimated Creatinine Clearance: 14.2 mL/min (A) (by C-G formula based on SCr of 2.3 mg/dL (H)).   Medical History: Past Medical History:  Diagnosis Date   CHF (congestive heart failure) (HCC)    Diabetes mellitus without complication (HCC)    GERD (gastroesophageal reflux disease)    Hyperlipidemia    Hypertension     Medications:  No history of chronic anticoagulant use PTA  Assessment: Pharmacy has been consulted to initiate and titrate heparin infusion in 88yo female with PMH of heart failure, HTN, HLD, anxiety and depression who presented to the ED with chief concerns of shortness of breath. Troponin levels of 304>413. Baseline labs have been ordered and are pending.   Goal of Therapy:  Heparin level 0.3-0.7 units/ml Monitor platelets by anticoagulation protocol: Yes  11/14 0153 HL 0.50, therapeutic x 1   Plan:  Continue heparin infusion at 700 units/hr Recheck HL in 8 hr to confirm Continue to monitor H&H and platelets  Renda Rolls, PharmD, [facility] 07/28/2022 3:15 AM

## 2022-07-28 NOTE — Progress Notes (Addendum)
Patient was seen evaluated and examined by me and the PA on 07/28/22.  Course of action, evaluation, and management decisions were developed solely by me, but detailed below in the PA's note. [place] NOTE       Patient ID: Jordan Thornton MRN: [id number] DOB/AGE: [id number] 86 y.o.  Admit date: 07/27/2022 Referring Physician Dr. Rupert Stacks Primary Physician Dr. Otilio Miu Primary Cardiologist Dr. Clayborn Bigness Reason for Consultation acute on chronic HFrEF, medication optimization  HPI: Jordan Thornton is a 88yoF with a PMH of HFrEF (30-35%, LVH, g1dd), CAD (NSTEMI 03/2022, treated medically), CKD 4 (baseline Cr 1.98-2.2), DM2, HTN, HLD who presented to [facility] ED 07/27/2022 with worsening shortness of breath over the past 3-4 days with associated PND.  Cardiology is consulted for further assistance with her heart failure.  Interval history:  -Feels better today, slept well last night.  No shortness of breath or PND overnight.  Denies chest discomfort/heaviness, palpitations or peripheral edema. -Diuresed to a net negative of 2.7 L so far.  Repeat echo being performed this morning  Review of systems complete and found to be negative unless listed above     Past Medical History:  Diagnosis Date   CHF (congestive heart failure) (HCC)    Diabetes mellitus without complication (HCC)    GERD (gastroesophageal reflux disease)    Hyperlipidemia    Hypertension     Past Surgical History:  Procedure Laterality Date   bladder tack     BREAST BIOPSY Left 10-15 yrs ago   benign   CATARACT EXTRACTION Bilateral 05/2015   CHOLECYSTECTOMY OPEN     VAGINAL HYSTERECTOMY      (Not in a hospital admission)  Social History   Socioeconomic History   Marital status: Married    Spouse name: Not on file   Number of children: 2   Years of education: Not on file   Highest education level: Bachelor's degree (e.g., BA, AB, BS)  Occupational History    Employer:  RETIRED  Tobacco Use   Smoking status: Former    Packs/day: 0.25    Years: 25.00    Total pack years: 6.25    Types: Cigarettes    Quit date: 1982    Years since quitting: 41.8   Smokeless tobacco: Never   Tobacco comments:    smoking cessation materials not required  Vaping Use   Vaping Use: Never used  Substance and Sexual Activity   Alcohol use: No    Alcohol/week: 0.0 standard drinks of alcohol   Drug use: No   Sexual activity: Not Currently  Other Topics Concern   Not on file  Social History Narrative   Not on file   Social Determinants of Health   Financial Resource Strain: Low Risk  (02/23/2022)   Overall Financial Resource Strain (CARDIA)    Difficulty of Paying Living Expenses: Not hard at all  Food Insecurity: No Food Insecurity (02/23/2022)   Hunger Vital Sign    Worried About Running Out of Food in the Last Year: Never true    Ran Out of Food in the Last Year: Never true  Transportation Needs: No Transportation Needs (02/23/2022)   PRAPARE - Hydrologist (Medical): No    Lack of Transportation (Non-Medical): No  Physical Activity: Inactive (02/23/2022)   Exercise Vital Sign    Days of Exercise per Week: 0 days    Minutes of Exercise per Session: 0 min  Stress: No Stress Concern  Present (02/23/2022)   [place]    Feeling of Stress : Only a little  Social Connections: Moderately Integrated (02/23/2022)   Social Connection and Isolation Panel [NHANES]    Frequency of Communication with Friends and Family: More than three times a week    Frequency of Social Gatherings with Friends and Family: Three times a week    Attends Religious Services: More than 4 times per year    Active Member of Clubs or Organizations: No    Attends Archivist Meetings: Never    Marital Status: Married  Human resources officer Violence: Not At Risk (02/23/2022)   Humiliation, Afraid, Rape,  and Kick questionnaire    Fear of Current or Ex-Partner: No    Emotionally Abused: No    Physically Abused: No    Sexually Abused: No    Family History  Problem Relation Age of Onset   Heart disease Maternal Aunt    Heart disease Maternal Grandmother    Stroke Father    Breast cancer Neg Hx      Vitals:   07/28/22 0350 07/28/22 0700 07/28/22 0713 07/28/22 0714  BP: (!) 112/52 129/75    Pulse: 83 (!) 59    Resp: 18 (!) 21    Temp:   (!) 97.4 F (36.3 C) (!) 97.5 F (36.4 C)  TempSrc:   Oral   SpO2: 100% 99%    Weight:      Height:        PHYSICAL EXAM General: Pleasant elderly black female, well nourished, in no acute distress.  Appears younger than stated age, sitting upright in ED stretcher with husband at bedside.  Echo being performed. HEENT:  Normocephalic and atraumatic. Neck:  + JVD.  Lungs: Normal respiratory effort on room air.  Trace bibasilar crackles without appreciable wheezes.   Heart: HRRR with frequent premature beats. Normal S1 and S2 without gallops or murmurs.  Abdomen: Nondistended appearing. Msk: Normal strength and tone for age. Extremities: Warm and well perfused. No clubbing, cyanosis.  Trace right lower extremity edema, improving.  Neuro: Alert and oriented X 3. Psych:  Answers questions appropriately.   Labs: Basic Metabolic Panel: Recent Labs    07/27/22 1212 07/27/22 1722 07/28/22 0153  NA 142  --  142  K 4.5  --  3.7  CL 111  --  109  CO2 21*  --  23  GLUCOSE 139*  --  102*  BUN 50*  --  51*  CREATININE 2.44*  --  2.30*  CALCIUM 9.4  --  9.0  MG  --  2.2  --     Liver Function Tests: No results for input(s): "AST", "ALT", "ALKPHOS", "BILITOT", "PROT", "ALBUMIN" in the last 72 hours. No results for input(s): "LIPASE", "AMYLASE" in the last 72 hours. CBC: Recent Labs    07/27/22 1212 07/28/22 0153  WBC 8.0 6.9  NEUTROABS 6.5  --   HGB 11.1* 10.3*  HCT 33.2* 30.3*  MCV 77.8* 76.9*  PLT 166 161    Cardiac  Enzymes: Recent Labs    07/27/22 1352 07/27/22 1722 07/27/22 2053  TROPONINIHS 413* 727* 898*    BNP: Recent Labs    07/27/22 1212  BNP >4,500.0*    D-Dimer: No results for input(s): "DDIMER" in the last 72 hours. Hemoglobin A1C: No results for input(s): "HGBA1C" in the last 72 hours. Fasting Lipid Panel: No results for input(s): "CHOL", "HDL", "LDLCALC", "TRIG", "CHOLHDL", "LDLDIRECT" in the  last 72 hours. Thyroid Function Tests: No results for input(s): "TSH", "T4TOTAL", "T3FREE", "THYROIDAB" in the last 72 hours.  Invalid input(s): "FREET3" Anemia Panel: No results for input(s): "VITAMINB12", "FOLATE", "FERRITIN", "TIBC", "IRON", "RETICCTPCT" in the last 72 hours.   Radiology: DG Chest 2 View  Result Date: 07/27/2022 CLINICAL DATA:  Shortness of breath. EXAM: CHEST - 2 VIEW COMPARISON:  April 03, 2022. FINDINGS: Stable cardiomegaly. Mild bibasilar subsegmental atelectasis or possibly edema is noted with small bilateral pleural effusions. Bony thorax is unremarkable. IMPRESSION: Mild bibasilar subsegmental atelectasis or possibly edema is noted with small bilateral pleural effusions. Electronically Signed   By: Marijo Conception M.D.   On: 07/27/2022 12:49    05/14/2022 Lexiscan myoview Impression   Abnormal myocardial perfusion scan there is left ventricular enlargement there is poor tracer uptake overall ejection fraction is globally depressed at 20 to 25% there is a large apical inferior persistent defect consistent with infarct there is minimal border zone redistribution.  This study is consistent with nonischemic cardiomyopathy consider further evaluation possibly cardiac cath  Narrative  Table formatting from the original result was not included. [facility] [address], [place], [place]  [id number] [phone]  Procedure: Pharmacologic Myocardial Perfusion Imaging   ONE day procedure  Indication: New  onset of congestive heart failure (CMS-HCC) Plan: NM myocardial perfusion SPECT multiple (stress       and rest), ECG stress test only  Ordering Physician:  Dr. Lujean Amel  Clinical History: 88 y.o. year old female recent anginal symptoms Vitals: Height: 65 in Weight: 119 lb Cardiac risk factors include:    CHF, Hyperlipidemia, Previous MI, Diabetes, HTN, and CAD  Procedure:  Pharmacologic stress testing was performed with Regadenoson using a single use 0.4mg /61ml (0.08 mg/ml) prefilled syringe intravenously infused as a bolus dose over 10-15 seconds. The stress test was stopped due to Infusion completion.  Blood pressure response was normal. The patient did not develop any symptoms other than fatigue during the procedure.  Rest HR: 67bpm Rest BP: 100/17mmHg Max HR: 90bpm Min BP: 120/69mmHg  Stress Test Administered by: Oswald Hillock, CMA  ECG Interpretation: Rest ECG:  normal sinus rhythm,  LVH diffuse nonspecific ST-T wave Stress ECG:  normal sinus rhythm, Recovery ECG:  normal sinus rhythm ECG Interpretation:  non-diagnostic due to pharmacologic testing.  Gated LV Analysis:  Summary of LV Perfusion: Abnormalinfarction,  Summary of LV Function: Abnormal   TID Ratio: 1.03  LVEF= 20-25%%  FINDINGS: Regional wall motion:  demonstrates  hypokinesis of the global walls . The overall quality of the study is fair.   Artifacts noted: no Left ventricular cavity: enlarged.  ECHO limited 04/05/2022 1. Left ventricular ejection fraction, by estimation, is 30 to 35%. The  left ventricle has moderately decreased function. The left ventricle  demonstrates global hypokinesis. The left ventricular internal cavity size  was moderately dilated. Left  ventricular diastolic function could not be evaluated.   2. The right ventricular size is normal. Mildly increased right  ventricular wall thickness.   3. The mitral valve is normal in structure. Mild mitral valve   regurgitation.   4. The aortic valve is grossly normal. Aortic valve regurgitation is not  visualized. Aortic valve sclerosis/calcification is present, without any  evidence of aortic stenosis.   Conclusion(s)/Recommendation(s): Poor windows for evaluation of left  ventricular function by transthoracic echocardiography. Would recommend an  alternative means of evaluation.   TELEMETRY reviewed by me (LT) 07/28/2022 : Sinus  rhythm rate 70s to 80s with PVCs and PACs  EKG reviewed by me: Sinus rhythm, PACs, rate 72 bpm  Data reviewed by me (LT) 07/28/2022: Admission H&P, CBC BMP BNP troponins vitals telemetry  Principal Problem:   Paroxysmal nocturnal dyspnea Active Problems:   Hyperlipidemia   Hypertension   Gastro-esophageal reflux disease without esophagitis   H/O total hysterectomy with bilateral salpingo-oophorectomy (BSO)   Type 2 diabetes mellitus with hyperlipidemia ([place])   Depression with anxiety   Elevated troponin   Acute exacerbation of CHF (congestive heart failure) ([place])    ASSESSMENT AND PLAN:  Jordan Thornton is a 53yoF with a PMH of HFrEF (30-35%, LVH, g1dd), CAD (NSTEMI 03/2022, treated medically), CKD 4 (baseline Cr 1.98-2.2), DM2, HTN, HLD who presented to [facility] ED 07/27/2022 with worsening shortness of breath over the past 3-4 days with associated PND.  Cardiology is consulted for further assistance with her heart failure.  #Acute on chronic HFrEF (EF 30-35% 03/2022) Presents with a several day history of worsening shortness of breath, and associated PND starting 2 days ago.  BNP grossly elevated at >4500, chest x-ray with small bilateral pleural effusions and possible pulmonary edema.  She is on room air and JVD on exam does not otherwise appear significantly clinically volume overloaded despite her markedly abnormal BNP.  She ate pot roast for dinner last night, query if this could have been somewhat contributory. -S/p IV Lasix 40 mg x 3, with clinical  improvement and excellent diuresis and improvement in her renal function -Continue GDMT with metoprolol XL 12.5mg  daily.  Ordered hydralazine 10mg  TID and imdur 15mg  daily, but unfortunately her blood pressure has been too low to receive these so far while inpatient.  Continue to monitor. -defer ACEi/ARB/entresto, MRA with renal insufficiency -Strict I/O, salt & fluid restriction.   #CKD 4 BUN/creatinine on admission 50/2.44 and GFR of 18, this appears to be slightly worse than her baseline -Improving slightly with diuresis  #CAD s/p recent NSTEMI 03/2022, medically managed #Elevated troponin Troponins elevated at 304-413, without acute ST or T wave changes on EKG, with nonspecific chest heaviness earlier today, that resolved with IV Lasix.  This likely is most consistent with demand/supply mismatch vs NSTEMI with uptrend to peak of 900.  She had an abnormal Lexiscan Myoview in September that showed a large apical/inferior persistent defect.  I discussed with patient again about her troponins with some possible concern for ischemia based on her troponin trend.  We talked again about ischemic work-up at some point with an LHC, she is from her wishes to never go on dialysis, which she would certainly be at risk for should she have a LHC done.  She strongly prefers to continue medical management, which is certainly reasonable. -Continue aspirin and Plavix daily to complete 6 months (January 2024), ideally longer -Continue heparin infusion to complete 48 hours. -Continue atorvastatin 40 mg daily  This patient's plan of care was discussed and created with Dr. Clayborn Bigness and he is in agreement.  Signed: Tristan Schroeder , PA-C 07/28/2022, 8:26 AM [facility]

## 2022-07-28 NOTE — Progress Notes (Signed)
Initial Nutrition Assessment  DOCUMENTATION CODES:   Not applicable  INTERVENTION:   -Ensure Enlive po BID, each supplement provides 350 kcal and 20 grams of protein -MVI with minerals daily -Liberalize diet to 2 gram sodium for wider variety of meal selections -RD provided "Low Sodium Nutrition Therapy" handout from [facility]'s Nutrition Care Manual; attached to AVS/ discharge summary  NUTRITION DIAGNOSIS:   Increased nutrient needs related to chronic illness (CHF) as evidenced by estimated needs.  GOAL:   Patient will meet greater than or equal to 90% of their needs  MONITOR:   PO intake, Supplement acceptance  REASON FOR ASSESSMENT:   Rounds    ASSESSMENT:   Pt with history of heart failure reduced ejection fraction, hypertension, hyperlipidemia, anxiety, depression, non-insulin-dependent diabetes mellitus, who presents for chief concerns of shortness of breath when laying down at night.  Pt admitted with paroxysmal nocturnal dyspnea.   Reviewed I/O's: -1.5 L x 24 hours  UOP: 1.5 L x 24 hours  Pt unavailable at time of visit. RD unable to obtain further nutrition-related history or complete nutrition-focused physical exam at this time.    Pt currently on a heart healthy diet. No meal completion data available to assess at this time.    Reviewed wt hx; pt has experienced a 11.8% wt loss over the past 6 months, which is significant for time frame. Given advanced age, significant weight loss, and multiple co-morbidities, pt is at risk for malnutrition, however, unable to identify at this time. Pt would grealy benefit from addition of oral nutrition supplements.   Lab Results  Component Value Date   HGBA1C 6.7 (H) 07/02/2022   PTA DM medications are 25 mg [place] daily.   Labs reviewed: CBGS: 89-101 (inpatient orders for glycemic control are 0-5 units insulin aspart daily at bedtime and 0-9 units insulin aspart TID with meals).    Diet Order:   Diet Order              Diet Heart Room service appropriate? Yes; Fluid consistency: Thin  Diet effective now                   EDUCATION NEEDS:   No education needs have been identified at this time  Skin:  Skin Assessment: Reviewed RN Assessment  Last BM:  07/26/22  Height:   Ht Readings from Last 1 Encounters:  07/27/22 5\' 5"  (1.651 m)    Weight:   Wt Readings from Last 1 Encounters:  07/27/22 55.2 kg    Ideal Body Weight:  56.8 kg  BMI:  Body mass index is 20.25 kg/m.  Estimated Nutritional Needs:   Kcal:  1500-1700  Protein:  75-90 grams  Fluid:  > 1.5 L    Loistine Chance, RD, LDN, [place] Registered Dietitian II Certified Diabetes Care and Education Specialist Please refer to [facility] for RD and/or RD on-call/weekend/after hours pager

## 2022-07-28 NOTE — Progress Notes (Signed)
1910 patient alert x 4 able to make all needs known bed alarm on pur wick intact patient husband at bedside 2115 all meds given PO as ordered patient complained that back was hurting pain not specified just all over wanting something to help her sleep and relax PRN medications given  07/29/2022  0500 complete ADL care provided all linens changed patient did own oral care husband at bedside

## 2022-07-28 NOTE — Discharge Instructions (Signed)

## 2022-07-28 NOTE — Consult Note (Signed)
   Heart Failure Nurse Navigator Note  HFrEF 30-35%.  Left ventricular internal cavity is moderately dilated.  Borderline left ventricular hypertrophy.  Mitral regurgitation.  Resented to the emergency room with complaints of worsening of shortness of breath with lying down, relieved with sitting up and dyspnea on exertion.  BNP 4500.  This x-ray with mild possible edema and small bilateral pleural effusions.  Comorbidities:  Hypertension Hyperlipidemia Anxiety/depression Diabetes  Medications:  Aspirin 81 mg daily Atorvastatin 40 mg every p.m. Furosemide 40 mg IV twice a day Isosorbide mononitrate 15 mg daily Metoprolol succinate 12 and half milligrams at bedtime  Labs:   Sodium 142, potassium 3.7, chloride 109, CO2 23, BUN 51, creatinine 2.3, troponin 898 Weight 55.2 kg Blood pressure 129/75  Initial meeting with patient on this admission.  Her husband is present at the bedside.  She states at home that she had been compliant with her medications and her daily weights.  She states that she feels that her weights are not helpful as 1 day she might be up 2 pounds and then the next day should be down.  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  She is a retired Radio producer.  She had not noted any swelling of her legs, hands, feet, or abdomen.  She states that she is compliant with her diet has not used salt on her food for a long time.  She states once a month she will make chicken and dumplings.  Gust when she does that that make concessions of lower sodium and the other 2 meals of the day.  She voices understanding.  He states that kidney doctor is told her not to drink any more than 36 ounces a day and she drinks 2 bottled waters, a 12 ounce Sprite and will occasionally have Jell-O.  Discussed  follow-up in the outpatient heart failure clinic on November 29 at 9:30 in the morning.  She has a 5% no-show ratio which is 3 out of 60 appointments.  Also discussed  the ventricle health program as she has a smart phone and does text, email and video chat.  She was given information and would like to think about that program.  Sh e has no further questions.  Pricilla Riffle RN [facility]

## 2022-07-28 NOTE — Assessment & Plan Note (Signed)
Appreciate cardiology help.  Repeat echocardiogram results pending.  Responding well to Lasix and has diuresed almost 3 L.  Not hypoxic.

## 2022-07-28 NOTE — Assessment & Plan Note (Signed)
Appears to be at baseline.  With diuresis, improvement in creatinine.

## 2022-07-29 DIAGNOSIS — I5023 Acute on chronic systolic (congestive) heart failure: Secondary | ICD-10-CM | POA: Diagnosis not present

## 2022-07-29 LAB — CBC
HCT: 34.9 % — ABNORMAL LOW (ref 36.0–46.0)
Hemoglobin: 12.2 g/dL (ref 12.0–15.0)
MCH: 26.3 pg (ref 26.0–34.0)
MCHC: 35 g/dL (ref 30.0–36.0)
MCV: 75.4 fL — ABNORMAL LOW (ref 80.0–100.0)
Platelets: 161 10*3/uL (ref 150–400)
RBC: 4.63 MIL/uL (ref 3.87–5.11)
RDW: 16.2 % — ABNORMAL HIGH (ref 11.5–15.5)
WBC: 7.2 10*3/uL (ref 4.0–10.5)
nRBC: 0 % (ref 0.0–0.2)

## 2022-07-29 LAB — GLUCOSE, CAPILLARY
Glucose-Capillary: 108 mg/dL — ABNORMAL HIGH (ref 70–99)
Glucose-Capillary: 165 mg/dL — ABNORMAL HIGH (ref 70–99)
Glucose-Capillary: 88 mg/dL (ref 70–99)
Glucose-Capillary: 104 mg/dL — ABNORMAL HIGH (ref 70–99)

## 2022-07-29 LAB — BASIC METABOLIC PANEL
Anion gap: 12 (ref 5–15)
BUN: 57 mg/dL — ABNORMAL HIGH (ref 8–23)
CO2: 25 mmol/L (ref 22–32)
Calcium: 9.4 mg/dL (ref 8.9–10.3)
Chloride: 104 mmol/L (ref 98–111)
Creatinine, Ser: 2.43 mg/dL — ABNORMAL HIGH (ref 0.44–1.00)
GFR, Estimated: 18 mL/min — ABNORMAL LOW (ref >60)
Glucose, Bld: 105 mg/dL — ABNORMAL HIGH (ref 70–99)
Potassium: 3.7 mmol/L (ref 3.5–5.1)
Sodium: 141 mmol/L (ref 135–145)

## 2022-07-29 LAB — HEPARIN LEVEL (UNFRACTIONATED): Heparin Unfractionated: 0.35 IU/mL (ref 0.30–0.70)

## 2022-07-29 MED ORDER — CLOPIDOGREL BISULFATE 75 MG PO TABS
75.0000 mg | ORAL_TABLET | Freq: Every day | ORAL | Status: DC
  Administered 2022-07-29 – 2022-07-30 (×2): 75 mg via ORAL
  Filled 2022-07-29 (×2): qty 1

## 2022-07-29 MED ORDER — SENNOSIDES-DOCUSATE SODIUM 8.6-50 MG PO TABS
1.0000 | ORAL_TABLET | Freq: Two times a day (BID) | ORAL | Status: DC
  Administered 2022-07-29 – 2022-07-30 (×3): 1 via ORAL
  Filled 2022-07-29 (×3): qty 1

## 2022-07-29 NOTE — Progress Notes (Signed)
[place] NOTE       Patient ID: Jordan Thornton MRN: [id number] DOB/AGE: 03/08/1930 88 y.o.  Admit date: 07/27/2022 Referring Physician Dr. Rupert Stacks Primary Physician Dr. Otilio Miu Primary Cardiologist Dr. Clayborn Bigness Reason for Consultation acute on chronic HFrEF, medication optimization  HPI: Jordan Thornton is a 82yoF with a PMH of HFrEF (30-35%, LVH, g1dd), CAD (NSTEMI 03/2022, treated medically), CKD 4 (baseline Cr 1.98-2.2), DM2, HTN, HLD who presented to [facility] ED 07/27/2022 with worsening shortness of breath over the past 3-4 days with associated PND.  Cardiology is consulted for further assistance with her heart failure.  Interval history:  -Seen and examined this morning, she slept well last night and denies further episodes of PND or shortness of breath.  She ate breakfast and subsequently felt nauseous after that was resolved after antiemetics.  She denied any chest heaviness or discomfort associated with this nausea, this has not recurred since this morning. -She has diuresed well, net -3.5 L.  Creatinine bumped slightly, so diuretics were held today -She will complete 48 hours of heparin this evening, also started back on Plavix.  Review of systems complete and found to be negative unless listed above     Past Medical History:  Diagnosis Date   CHF (congestive heart failure) (HCC)    Diabetes mellitus without complication (HCC)    GERD (gastroesophageal reflux disease)    Hyperlipidemia    Hypertension     Past Surgical History:  Procedure Laterality Date   bladder tack     BREAST BIOPSY Left 10-15 yrs ago   benign   CATARACT EXTRACTION Bilateral 05/2015   CHOLECYSTECTOMY OPEN     VAGINAL HYSTERECTOMY      Medications Prior to Admission  Medication Sig Dispense Refill Last Dose   atorvastatin (LIPITOR) 40 MG tablet Take 1 tablet (40 mg total) by mouth every evening. 30 tablet 0 07/26/2022   clopidogrel (PLAVIX) 75 MG tablet  Take 1 tablet (75 mg total) by mouth daily. 30 tablet 0 07/27/2022   furosemide (LASIX) 20 MG tablet Take 1 tablet (20 mg total) by mouth daily. (Patient taking differently: Take 10 mg by mouth daily.) 30 tablet 0 07/27/2022   hydrALAZINE (APRESOLINE) 25 MG tablet Take 12.5 mg by mouth 2 (two) times daily.   07/27/2022   isosorbide mononitrate (IMDUR) 30 MG 24 hr tablet Take 30 mg by mouth daily.   07/27/2022   JANUVIA 25 MG tablet Take 1 tablet (25 mg total) by mouth daily. 90 tablet 1 07/27/2022   lansoprazole (PREVACID) 15 MG capsule Take 1 capsule by mouth once daily 90 capsule 0 07/27/2022   LORazepam (ATIVAN) 0.5 MG tablet Take 1 tablet (0.5 mg total) by mouth as needed. Per 30 days 15 tablet 5 prn   metoprolol succinate (TOPROL-XL) 25 MG 24 hr tablet Take 0.5 tablets (12.5 mg total) by mouth at bedtime. 15 tablet 0 07/26/2022   Omega-3 Fatty Acids (FISH OIL) 1000 MG CAPS Take 1 capsule (1,000 mg total) by mouth daily. 100 capsule 3 Past Month   Acetaminophen (TYLENOL ARTHRITIS PAIN PO) Take 2 tablets by mouth daily as needed.    prn at prn   ADVOCATE LANCETS MISC use once daily as directed to test blood sugar 100 each 0    ASPIRIN 81 81 MG tablet Take 81 mg by mouth once. (Patient not taking: Reported on 07/28/2022)   Not Taking   aspirin EC 81 MG tablet Take 1 tablet (81 mg  total) by mouth daily. Swallow whole. 30 tablet 12 07/23/2022   Blood Glucose Monitoring Suppl (FREESTYLE LITE) DEVI use once daily as directed to test blood sugar 1 each 0    Elderberry 575 MG/5ML SYRP Take 1 Dose by mouth daily.       FREESTYLE LITE test strip USE 1 STRIP TO CHECK GLUCOSE ONCE DAILY AS DIRECTED 50 each 0    Lancet Devices (ADVOCATE LANCING DEVICE) MISC use once daily as directed to test blood sugar 1 each 0     Social History   Socioeconomic History   Marital status: Married    Spouse name: Not on file   Number of children: 2   Years of education: Not on file   Highest education level:  Bachelor's degree (e.g., BA, AB, BS)  Occupational History    Employer: RETIRED  Tobacco Use   Smoking status: Former    Packs/day: 0.25    Years: 25.00    Total pack years: 6.25    Types: Cigarettes    Quit date: 1982    Years since quitting: 41.8   Smokeless tobacco: Never   Tobacco comments:    smoking cessation materials not required  Vaping Use   Vaping Use: Never used  Substance and Sexual Activity   Alcohol use: No    Alcohol/week: 0.0 standard drinks of alcohol   Drug use: No   Sexual activity: Not Currently  Other Topics Concern   Not on file  Social History Narrative   Not on file   Social Determinants of Health   Financial Resource Strain: Low Risk  (02/23/2022)   Overall Financial Resource Strain (CARDIA)    Difficulty of Paying Living Expenses: Not hard at all  Food Insecurity: No Food Insecurity (07/28/2022)   Hunger Vital Sign    Worried About Running Out of Food in the Last Year: Never true    Ran Out of Food in the Last Year: Never true  Transportation Needs: No Transportation Needs (07/28/2022)   PRAPARE - Hydrologist (Medical): No    Lack of Transportation (Non-Medical): No  Physical Activity: Inactive (02/23/2022)   Exercise Vital Sign    Days of Exercise per Week: 0 days    Minutes of Exercise per Session: 0 min  Stress: No Stress Concern Present (02/23/2022)   [place]    Feeling of Stress : Only a little  Social Connections: Moderately Integrated (02/23/2022)   Social Connection and Isolation Panel [NHANES]    Frequency of Communication with Friends and Family: More than three times a week    Frequency of Social Gatherings with Friends and Family: Three times a week    Attends Religious Services: More than 4 times per year    Active Member of Clubs or Organizations: No    Attends Archivist Meetings: Never    Marital Status: Married   Human resources officer Violence: Not At Risk (07/28/2022)   Humiliation, Afraid, Rape, and Kick questionnaire    Fear of Current or Ex-Partner: No    Emotionally Abused: No    Physically Abused: No    Sexually Abused: No    Family History  Problem Relation Age of Onset   Heart disease Maternal Aunt    Heart disease Maternal Grandmother    Stroke Father    Breast cancer Neg Hx      Vitals:   07/28/22 2326 07/29/22 0500 07/29/22 0801 07/29/22 1214  BP: (!) 114/51 106/62 113/73 (!) 102/54  Pulse:   68 81  Resp:  20 18 18   Temp: (!) 97.4 F (36.3 C) 97.7 F (36.5 C) 97.6 F (36.4 C) 97.7 F (36.5 C)  TempSrc: Axillary Oral Oral Oral  SpO2: 100% 100% 99% 98%  Weight:  55.6 kg    Height:        PHYSICAL EXAM General: Pleasant elderly black female, well nourished, in no acute distress.  Appears younger than stated age, sitting upright in hospital bed, husband sitting in recliner, sleeping. HEENT:  Normocephalic and atraumatic. Neck:  no JVD.  Lungs: Normal respiratory effort on room air.  very trace bibasilar crackles without appreciable wheezes.   Heart: HRRR with frequent premature beats. Normal S1 and S2 without gallops or murmurs.  Abdomen: Nondistended appearing. Msk: Normal strength and tone for age. Extremities: Warm and well perfused. No clubbing, cyanosis.  No peripheral edema neuro: Alert and oriented X 3. Psych:  Answers questions appropriately.   Labs: Basic Metabolic Panel: Recent Labs    07/27/22 1722 07/28/22 0153 07/29/22 0453  NA  --  142 141  K  --  3.7 3.7  CL  --  109 104  CO2  --  23 25  GLUCOSE  --  102* 105*  BUN  --  51* 57*  CREATININE  --  2.30* 2.43*  CALCIUM  --  9.0 9.4  MG 2.2  --   --     Liver Function Tests: No results for input(s): "AST", "ALT", "ALKPHOS", "BILITOT", "PROT", "ALBUMIN" in the last 72 hours. No results for input(s): "LIPASE", "AMYLASE" in the last 72 hours. CBC: Recent Labs    07/27/22 1212 07/28/22 0153  07/29/22 0453  WBC 8.0 6.9 7.2  NEUTROABS 6.5  --   --   HGB 11.1* 10.3* 12.2  HCT 33.2* 30.3* 34.9*  MCV 77.8* 76.9* 75.4*  PLT 166 161 161    Cardiac Enzymes: Recent Labs    07/27/22 1722 07/27/22 2053 07/28/22 0153  TROPONINIHS 727* 898* 708*    BNP: Recent Labs    07/27/22 1212  BNP >4,500.0*    D-Dimer: No results for input(s): "DDIMER" in the last 72 hours. Hemoglobin A1C: No results for input(s): "HGBA1C" in the last 72 hours. Fasting Lipid Panel: No results for input(s): "CHOL", "HDL", "LDLCALC", "TRIG", "CHOLHDL", "LDLDIRECT" in the last 72 hours. Thyroid Function Tests: No results for input(s): "TSH", "T4TOTAL", "T3FREE", "THYROIDAB" in the last 72 hours.  Invalid input(s): "FREET3" Anemia Panel: No results for input(s): "VITAMINB12", "FOLATE", "FERRITIN", "TIBC", "IRON", "RETICCTPCT" in the last 72 hours.   Radiology: ECHOCARDIOGRAM COMPLETE  Result Date: 07/28/2022    ECHOCARDIOGRAM REPORT   Patient Name:   Jordan Thornton Date of Exam: 07/28/2022 Medical Rec #:  [id number]              Height:       65.0 in Accession #:    [id number]             Weight:       121.7 lb Date of Birth:  1930/01/03               BSA:          1.602 m Patient Age:    55 years               BP:           Not listed in chart/Not  listed in chart mmHg Patient Gender: F                      HR:           103 bpm. Exam Location:  [facility] Procedure: 2D Echo, Cardiac Doppler and Color Doppler Indications:     CHF-acute systolic I94.85  History:         Patient has prior history of Echocardiogram examinations, most                  recent 04/05/2022. CHF; Risk Factors:Hypertension and                  Dyslipidemia.  Sonographer:     Sherrie Sport Referring Phys:  [id number] [place] Loria Lacina Diagnosing Phys: Yolonda Kida MD  Sonographer Comments: Image quality was good. IMPRESSIONS  1. Left ventricular ejection fraction, by estimation, is  25 to 30%. The left ventricle has severely decreased function. The left ventricle demonstrates global hypokinesis. The left ventricular internal cavity size was mildly dilated. Left ventricular diastolic parameters are consistent with Grade I diastolic dysfunction (impaired relaxation).  2. Right ventricular systolic function is low normal. The right ventricular size is mildly enlarged.  3. The mitral valve is normal in structure. Mild mitral valve regurgitation.  4. The aortic valve is grossly normal. Aortic valve regurgitation is trivial. FINDINGS  Left Ventricle: Left ventricular ejection fraction, by estimation, is 25 to 30%. The left ventricle has severely decreased function. The left ventricle demonstrates global hypokinesis. The left ventricular internal cavity size was mildly dilated. There is no left ventricular hypertrophy. Left ventricular diastolic parameters are consistent with Grade I diastolic dysfunction (impaired relaxation). Right Ventricle: The right ventricular size is mildly enlarged. No increase in right ventricular wall thickness. Right ventricular systolic function is low normal. Left Atrium: Left atrial size was normal in size. Right Atrium: Right atrial size was normal in size. Pericardium: There is no evidence of pericardial effusion. Mitral Valve: The mitral valve is normal in structure. Mild mitral valve regurgitation. Tricuspid Valve: The tricuspid valve is normal in structure. Tricuspid valve regurgitation is mild. Aortic Valve: The aortic valve is grossly normal. Aortic valve regurgitation is trivial. Aortic valve mean gradient measures 2.0 mmHg. Aortic valve peak gradient measures 2.9 mmHg. Aortic valve area, by VTI measures 1.91 cm. Pulmonic Valve: The pulmonic valve was normal in structure. Pulmonic valve regurgitation is not visualized. Aorta: The ascending aorta was not well visualized. IAS/Shunts: No atrial level shunt detected by color flow Doppler.  LEFT VENTRICLE PLAX 2D  LVIDd:         4.90 cm      Diastology LVIDs:         4.30 cm      LV e' medial:    3.26 cm/s LV PW:         1.50 cm      LV E/e' medial:  19.3 LV IVS:        1.30 cm      LV e' lateral:   4.46 cm/s LVOT diam:     2.00 cm      LV E/e' lateral: 14.1 LV SV:         23 LV SV Index:   14 LVOT Area:     3.14 cm  LV Volumes (MOD) LV vol d, MOD A2C: 185.0 ml LV vol d, MOD A4C: 151.0 ml LV vol s, MOD A2C: 150.0 ml LV vol s,  MOD A4C: 142.0 ml LV SV MOD A2C:     35.0 ml LV SV MOD A4C:     151.0 ml LV SV MOD BP:      18.5 ml RIGHT VENTRICLE RV Basal diam:  3.50 cm RV Mid diam:    3.10 cm RV S prime:     8.27 cm/s TAPSE (M-mode): 2.5 cm LEFT ATRIUM           Index        RIGHT ATRIUM           Index LA diam:      3.40 cm 2.12 cm/m   RA Area:     10.70 cm LA Vol (A2C): 91.5 ml 57.13 ml/m  RA Volume:   25.70 ml  16.05 ml/m LA Vol (A4C): 29.2 ml 18.23 ml/m  AORTIC VALVE AV Area (Vmax):    1.82 cm AV Area (Vmean):   1.82 cm AV Area (VTI):     1.91 cm AV Vmax:           85.65 cm/s AV Vmean:          59.750 cm/s AV VTI:            0.120 m AV Peak Grad:      2.9 mmHg AV Mean Grad:      2.0 mmHg LVOT Vmax:         49.50 cm/s LVOT Vmean:        34.700 cm/s LVOT VTI:          0.073 m LVOT/AV VTI ratio: 0.61  AORTA Ao Root diam: 3.05 cm MITRAL VALVE               TRICUSPID VALVE MV Area (PHT): 7.66 cm    TR Peak grad:   8.8 mmHg MV Decel Time: 99 msec     TR Vmax:        148.00 cm/s MV E velocity: 62.90 cm/s MV A velocity: 97.50 cm/s  SHUNTS MV E/A ratio:  0.65        Systemic VTI:  0.07 m                            Systemic Diam: 2.00 cm Yolonda Kida MD Electronically signed by Yolonda Kida MD Signature Date/Time: 07/28/2022/11:30:29 PM    Final    DG Chest 2 View  Result Date: 07/27/2022 CLINICAL DATA:  Shortness of breath. EXAM: CHEST - 2 VIEW COMPARISON:  April 03, 2022. FINDINGS: Stable cardiomegaly. Mild bibasilar subsegmental atelectasis or possibly edema is noted with small bilateral pleural effusions. Bony  thorax is unremarkable. IMPRESSION: Mild bibasilar subsegmental atelectasis or possibly edema is noted with small bilateral pleural effusions. Electronically Signed   By: Marijo Conception M.D.   On: 07/27/2022 12:49    05/14/2022 Lexiscan myoview Impression   Abnormal myocardial perfusion scan there is left ventricular enlargement there is poor tracer uptake overall ejection fraction is globally depressed at 20 to 25% there is a large apical inferior persistent defect consistent with infarct there is minimal border zone redistribution.  This study is consistent with nonischemic cardiomyopathy consider further evaluation possibly cardiac cath  Narrative  Table formatting from the original result was not included. [facility] [address] [place], [place]  [id number] [phone]  Procedure: Pharmacologic Myocardial Perfusion Imaging   ONE day procedure  Indication: New onset of congestive heart  failure (CMS-HCC) Plan: NM myocardial perfusion SPECT multiple (stress       and rest), ECG stress test only  Ordering Physician:  Dr. Lujean Amel  Clinical History: 88 y.o. year old female recent anginal symptoms Vitals: Height: 65 in Weight: 119 lb Cardiac risk factors include:    CHF, Hyperlipidemia, Previous MI, Diabetes, HTN, and CAD  Procedure:  Pharmacologic stress testing was performed with Regadenoson using a single use 0.4mg /8ml (0.08 mg/ml) prefilled syringe intravenously infused as a bolus dose over 10-15 seconds. The stress test was stopped due to Infusion completion.  Blood pressure response was normal. The patient did not develop any symptoms other than fatigue during the procedure.  Rest HR: 67bpm Rest BP: 100/33mmHg Max HR: 90bpm Min BP: 120/48mmHg  Stress Test Administered by: Oswald Hillock, CMA  ECG Interpretation: Rest ECG:  normal sinus rhythm,  LVH diffuse nonspecific ST-T wave Stress ECG:   normal sinus rhythm, Recovery ECG:  normal sinus rhythm ECG Interpretation:  non-diagnostic due to pharmacologic testing.  Gated LV Analysis:  Summary of LV Perfusion: Abnormalinfarction,  Summary of LV Function: Abnormal   TID Ratio: 1.03  LVEF= 20-25%%  FINDINGS: Regional wall motion:  demonstrates  hypokinesis of the global walls . The overall quality of the study is fair.   Artifacts noted: no Left ventricular cavity: enlarged.  ECHO limited 04/05/2022 1. Left ventricular ejection fraction, by estimation, is 30 to 35%. The  left ventricle has moderately decreased function. The left ventricle  demonstrates global hypokinesis. The left ventricular internal cavity size  was moderately dilated. Left  ventricular diastolic function could not be evaluated.   2. The right ventricular size is normal. Mildly increased right  ventricular wall thickness.   3. The mitral valve is normal in structure. Mild mitral valve  regurgitation.   4. The aortic valve is grossly normal. Aortic valve regurgitation is not  visualized. Aortic valve sclerosis/calcification is present, without any  evidence of aortic stenosis.   Conclusion(s)/Recommendation(s): Poor windows for evaluation of left  ventricular function by transthoracic echocardiography. Would recommend an  alternative means of evaluation.   TELEMETRY reviewed by me (LT) 07/29/2022 : Sinus rhythm rate 70s to 80s with PVCs and PACs  EKG reviewed by me: Sinus rhythm, PACs, rate 72 bpm  Data reviewed by me (LT) 07/29/2022: Hospitalist progress note CBC BMP BNP troponins vitals telemetry  Principal Problem:   Acute on chronic systolic CHF (congestive heart failure) (HCC) Active Problems:   Hyperlipidemia   Hypertension   Gastro-esophageal reflux disease without esophagitis   Type 2 diabetes mellitus with hyperlipidemia ([place])   Depression with anxiety   Elevated troponin   CKD (chronic kidney disease), stage IV (HCC)   Anemia  due to chronic kidney disease    ASSESSMENT AND PLAN:  Jordan Thornton is a 28yoF with a PMH of HFrEF (30-35%, LVH, g1dd), CAD (NSTEMI 03/2022, treated medically), CKD 4 (baseline Cr 1.98-2.2), DM2, HTN, HLD who [facility] ED 07/27/2022 with worsening shortness of breath over the past 3-4 days with associated PND.  Cardiology is consulted for further assistance with her heart failure.  #Acute on chronic HFrEF (EF 30-35% 03/2022) Presents with a several day history of worsening shortness of breath, and associated PND starting 2 days ago.  BNP grossly elevated at >4500, chest x-ray with small bilateral pleural effusions and possible pulmonary edema.  She is on room air and JVD on exam does not otherwise appear significantly clinically volume overloaded despite her markedly abnormal  BNP.  She ate pot roast for dinner last night, query if this could have been somewhat contributory. -S/p IV Lasix 40 mg x 4, with clinical improvement and excellent diuresis and improvement in her renal function -Hold diuresis today with bump in creatinine, consider redosing after recheck of BMP in the morning. -Continue GDMT with metoprolol XL 12.5mg  daily.  Continue hydralazine 10mg  TID and imdur 15mg  daily. -defer ACEi/ARB/entresto, MRA with renal insufficiency -Strict I/O, salt & fluid restriction.   #CKD 4 BUN/creatinine on admission 50/2.44 and GFR of 18, this appears to be slightly worse than her baseline -Improving slightly with diuresis, bump in this today as above  #CAD s/p recent NSTEMI 03/2022, medically managed #Elevated troponin Troponins elevated at 304-413, without acute ST or T wave changes on EKG, with nonspecific chest heaviness earlier today, that resolved with IV Lasix.  This likely is most consistent with demand/supply mismatch vs NSTEMI with uptrend to peak of 900.  She had an abnormal Lexiscan Myoview in September that showed a large apical/inferior persistent defect.  I discussed with  patient again about her troponins with some possible concern for ischemia based on her troponin trend.  We talked again about ischemic work-up at some point with an LHC, she is from her wishes to never go on dialysis, which she would certainly be at risk for should she have a LHC done.  She strongly prefers to continue medical management, which is certainly reasonable. -Continue aspirin and Plavix daily to complete 6 months (January 2024), ideally longer -Continue heparin infusion to complete 48 hours, ending today at 1800. -Continue atorvastatin 40 mg daily  Anticipate readiness for discharge from a cardiac standpoint tomorrow 11/16.  This patient's plan of care was discussed and created with Dr. Clayborn Bigness and he is in agreement.  Signed: Tristan Schroeder , PA-C 07/29/2022, 4:47 PM [facility]

## 2022-07-29 NOTE — Progress Notes (Signed)
[place] [place] NAME: Jordan Thornton    MR#:  [id number]  DATE OF BIRTH:  02/08/1930  SUBJECTIVE:   Husband at bedside. Patient overall feels better. Earlier had some nausea. Denies any chest pain or shortness of breath. Currently on room air.   VITALS:  Blood pressure (!) 102/54, pulse 81, temperature 97.7 F (36.5 C), temperature source Oral, resp. rate 18, height 5\' 5"  (1.651 m), weight 55.6 kg, SpO2 98 %.  PHYSICAL EXAMINATION:   GENERAL:  88 y.o.-year-old patient lying in the bed with no acute distress.  LUNGS: Normal breath sounds bilaterally, no wheezing CARDIOVASCULAR: S1, S2 normal. No murmurs,   ABDOMEN: Soft, nontender, nondistended. Bowel sounds present.  EXTREMITIES: No  edema b/l.    NEUROLOGIC: nonfocal  patient is alert and awake SKIN: No obvious rash, lesion, or ulcer.   LABORATORY PANEL:  CBC Recent Labs  Lab 07/29/22 0453  WBC 7.2  HGB 12.2  HCT 34.9*  PLT 161    Chemistries  Recent Labs  Lab 07/27/22 1722 07/28/22 0153 07/29/22 0453  NA  --    < > 141  K  --    < > 3.7  CL  --    < > 104  CO2  --    < > 25  GLUCOSE  --    < > 105*  BUN  --    < > 57*  CREATININE  --    < > 2.43*  CALCIUM  --    < > 9.4  MG 2.2  --   --    < > = values in this interval not displayed.   Cardiac Enzymes No results for input(s): "TROPONINI" in the last 168 hours. RADIOLOGY:  ECHOCARDIOGRAM COMPLETE  Result Date: 07/28/2022    ECHOCARDIOGRAM REPORT   Patient Name:   Jordan Thornton Date of Exam: 07/28/2022 Medical Rec #:  [id number]              Height:       65.0 in Accession #:    [id number]             Weight:       121.7 lb Date of Birth:  [birth year]               BSA:          1.602 m Patient Age:    23 years               BP:           Not listed in chart/Not                                                      listed in chart mmHg Patient Gender: F                      HR:           103 bpm. Exam  Location:  [facility] Procedure: 2D Echo, Cardiac Doppler and Color Doppler Indications:     CHF-acute systolic N46.27  History:         Patient has prior history of Echocardiogram examinations, most  recent 04/05/2022. CHF; Risk Factors:Hypertension and                  Dyslipidemia.  Sonographer:     Sherrie Sport Referring Phys:  [id number] Sadler TANG Diagnosing Phys: Yolonda Kida MD  Sonographer Comments: Image quality was good. IMPRESSIONS  1. Left ventricular ejection fraction, by estimation, is 25 to 30%. The left ventricle has severely decreased function. The left ventricle demonstrates global hypokinesis. The left ventricular internal cavity size was mildly dilated. Left ventricular diastolic parameters are consistent with Grade I diastolic dysfunction (impaired relaxation).  2. Right ventricular systolic function is low normal. The right ventricular size is mildly enlarged.  3. The mitral valve is normal in structure. Mild mitral valve regurgitation.  4. The aortic valve is grossly normal. Aortic valve regurgitation is trivial. FINDINGS  Left Ventricle: Left ventricular ejection fraction, by estimation, is 25 to 30%. The left ventricle has severely decreased function. The left ventricle demonstrates global hypokinesis. The left ventricular internal cavity size was mildly dilated. There is no left ventricular hypertrophy. Left ventricular diastolic parameters are consistent with Grade I diastolic dysfunction (impaired relaxation). Right Ventricle: The right ventricular size is mildly enlarged. No increase in right ventricular wall thickness. Right ventricular systolic function is low normal. Left Atrium: Left atrial size was normal in size. Right Atrium: Right atrial size was normal in size. Pericardium: There is no evidence of pericardial effusion. Mitral Valve: The mitral valve is normal in structure. Mild mitral valve regurgitation. Tricuspid Valve: The tricuspid valve is normal in  structure. Tricuspid valve regurgitation is mild. Aortic Valve: The aortic valve is grossly normal. Aortic valve regurgitation is trivial. Aortic valve mean gradient measures 2.0 mmHg. Aortic valve peak gradient measures 2.9 mmHg. Aortic valve area, by VTI measures 1.91 cm. Pulmonic Valve: The pulmonic valve was normal in structure. Pulmonic valve regurgitation is not visualized. Aorta: The ascending aorta was not well visualized. IAS/Shunts: No atrial level shunt detected by color flow Doppler.  LEFT VENTRICLE PLAX 2D LVIDd:         4.90 cm      Diastology LVIDs:         4.30 cm      LV e' medial:    3.26 cm/s LV PW:         1.50 cm      LV E/e' medial:  19.3 LV IVS:        1.30 cm      LV e' lateral:   4.46 cm/s LVOT diam:     2.00 cm      LV E/e' lateral: 14.1 LV SV:         23 LV SV Index:   14 LVOT Area:     3.14 cm  LV Volumes (MOD) LV vol d, MOD A2C: 185.0 ml LV vol d, MOD A4C: 151.0 ml LV vol s, MOD A2C: 150.0 ml LV vol s, MOD A4C: 142.0 ml LV SV MOD A2C:     35.0 ml LV SV MOD A4C:     151.0 ml LV SV MOD BP:      18.5 ml RIGHT VENTRICLE RV Basal diam:  3.50 cm RV Mid diam:    3.10 cm RV S prime:     8.27 cm/s TAPSE (M-mode): 2.5 cm LEFT ATRIUM           Index        RIGHT ATRIUM           Index  LA diam:      3.40 cm 2.12 cm/m   RA Area:     10.70 cm LA Vol (A2C): 91.5 ml 57.13 ml/m  RA Volume:   25.70 ml  16.05 ml/m LA Vol (A4C): 29.2 ml 18.23 ml/m  AORTIC VALVE AV Area (Vmax):    1.82 cm AV Area (Vmean):   1.82 cm AV Area (VTI):     1.91 cm AV Vmax:           85.65 cm/s AV Vmean:          59.750 cm/s AV VTI:            0.120 m AV Peak Grad:      2.9 mmHg AV Mean Grad:      2.0 mmHg LVOT Vmax:         49.50 cm/s LVOT Vmean:        34.700 cm/s LVOT VTI:          0.073 m LVOT/AV VTI ratio: 0.61  AORTA Ao Root diam: 3.05 cm MITRAL VALVE               TRICUSPID VALVE MV Area (PHT): 7.66 cm    TR Peak grad:   8.8 mmHg MV Decel Time: 99 msec     TR Vmax:        148.00 cm/s MV E velocity: 62.90 cm/s MV A  velocity: 97.50 cm/s  SHUNTS MV E/A ratio:  0.65        Systemic VTI:  0.07 m                            Systemic Diam: 2.00 cm Yolonda Kida MD Electronically signed by Yolonda Kida MD Signature Date/Time: 07/28/2022/11:30:29 PM    Final     Assessment and Plan  Jordan Thornton is a 29yoF with a PMH of HFrEF (30-35%, LVH, g1dd), CAD (NSTEMI 03/2022, treated medically), CKD 4 (baseline Cr 1.98-2.2), DM2, HTN, HLD who presented to [facility] ED 07/27/2022 with worsening shortness of breath over the past 3-4 days with associated PND.  BNP was 4500   Acute on chronic systolic CHF (congestive heart failure) [facility]) Appreciate cardiology help.  Repeat echocardiogram results pending.  Responding well to Lasix and has diuresed almost 4.1 L.  Not hypoxic. -- Lasix held. Creatinine slight bump.   Elevated troponin Elevated troponins in the setting of acute heart failure plus chronic renal failure.  Peaked at 900 and trending downward.  Cardiology following.  No evidence of acute ischemia at this time. -- Per cardiology continue IV heparin drip for 48 hours. Will discontinue later tonight. Chest pain.   CKD (chronic kidney disease), stage IV ([place]) Appears to be at baseline.  With diuresis, improvement in creatinine.   Hypertension - Metoprolol succinate  -- stable  Type 2 diabetes mellitus with hyperlipidemia (HCC) - Insulin SSI with at bedtime coverage ordered, CBG stable   Anemia due to chronic kidney disease Hemoglobin stable   Depression with anxiety - Resumed home lorazepam 0.5 mg p.o. every 6 hours as needed for anxiety   Hyperlipidemia - Atorvastatin   All overall hemodynamically stable. Patient improving. Ambulates by herself. If remains stable will discharged tomorrow. Patient and family agreeable.    Family communication :family at bedside Consults :[facility] cardiology CODE STATUS: FULL DVT Prophylaxis :heparin gtt Level of care: Telemetry Cardiac Status is: Inpatient Remains  inpatient appropriate because: CHF, elevated troponin on  IV heparin drip. Potential discharge tomorrow    TOTAL TIME TAKING CARE OF THIS PATIENT: 35 minutes.  >50% time spent on counselling and coordination of care  Note: This dictation was prepared with Dragon dictation along with smaller phrase technology. Any transcriptional errors that result from this process are unintentional.  Fritzi Mandes M.D    [facility]   CC: Primary care physician; Juline Patch, MD

## 2022-07-29 NOTE — Consult Note (Signed)
ANTICOAGULATION CONSULT NOTE  Pharmacy Consult for Heparin Indication:  NSTEMI  Allergies  Allergen Reactions   Sertraline     Made her more depressed    Patient Measurements: Height: 5\' 5"  (165.1 cm) Weight: 55.2 kg (121 lb 11.2 oz) IBW/kg (Calculated) : 57 Heparin Dosing Weight: 55.2 kg  Vital Signs: Temp: 97.7 F (36.5 C) (11/15 0500) Temp Source: Oral (11/15 0500) BP: 106/62 (11/15 0500) Pulse Rate: 88 (11/14 1942)  Labs: Recent Labs    07/27/22 1212 07/27/22 1352 07/27/22 1722 07/27/22 2053 07/28/22 0153 07/28/22 1151 07/29/22 0453  HGB 11.1*  --   --   --  10.3*  --   --   HCT 33.2*  --   --   --  30.3*  --   --   PLT 166  --   --   --  161  --   --   APTT  --   --  32  --   --   --   --   LABPROT  --   --  15.0  --   --   --   --   INR  --   --  1.2  --   --   --   --   HEPARINUNFRC  --   --   --   --  0.50 0.37 0.35  CREATININE 2.44*  --   --   --  2.30*  --  2.43*  TROPONINIHS 304*   < > 727* 898* 708*  --   --    < > = values in this interval not displayed.     Estimated Creatinine Clearance: 13.4 mL/min (A) (by C-G formula based on SCr of 2.43 mg/dL (H)).   Medical History: Past Medical History:  Diagnosis Date   CHF (congestive heart failure) (HCC)    Diabetes mellitus without complication (HCC)    GERD (gastroesophageal reflux disease)    Hyperlipidemia    Hypertension     Medications:  No history of chronic anticoagulant use PTA  Assessment: Pharmacy has been consulted to initiate and titrate heparin infusion in 88yo female with PMH of heart failure, HTN, HLD, anxiety and depression who presented to the ED with chief concerns of shortness of breath. Troponin levels of 304>413. Baseline labs have been ordered and are pending.   Goal of Therapy:  Heparin level 0.3-0.7 units/ml Monitor platelets by anticoagulation protocol: Yes   Date Time HL Rate/Comment 11/14 0153  0.50 Therapeutic x 1 11/14 1151 0.37 Therapeutic x  2 11/15 0453 0.35 Therapeutic  x3   Plan:  Continue heparin infusion at 700 units/hr Check anti-Xa level  daily with AM labs Continue to monitor H&H and platelets daily while on heparin gtt.   Thank you for allowing pharmacy to be a part of this patient's care.  Renda Rolls, PharmD, [facility] 07/29/2022 6:23 AM

## 2022-07-29 NOTE — Progress Notes (Signed)
  Transition of Care [facility]) Screening Note   Patient Details  Name: [place] McCoy Bon Date of Birth: 01-04-1930   Transition of Care [facility]) CM/SW Contact:    Tiburcio Bash, LCSW Phone Number: 07/29/2022, 1:58 PM    Transition of Care Department [facility]) has reviewed patient and no TOC needs have been identified at this time. We will continue to monitor patient advancement through interdisciplinary progression rounds. If new patient transition needs arise, please place a TOC consult.  Kelby Fam, [place], MSW, [place]

## 2022-07-29 NOTE — Progress Notes (Signed)
Pt ambulated around station x1, pt tolerated well.no distress noted.

## 2022-07-29 NOTE — Plan of Care (Signed)
  Problem: Education: Goal: Ability to demonstrate management of disease process will improve Outcome: Progressing Goal: Ability to verbalize understanding of medication therapies will improve Outcome: Progressing   Problem: Cardiac: Goal: Ability to achieve and maintain adequate cardiopulmonary perfusion will improve Outcome: Progressing   

## 2022-07-30 ENCOUNTER — Other Ambulatory Visit: Payer: Self-pay

## 2022-07-30 DIAGNOSIS — I5032 Chronic diastolic (congestive) heart failure: Secondary | ICD-10-CM

## 2022-07-30 DIAGNOSIS — N1832 Chronic kidney disease, stage 3b: Secondary | ICD-10-CM

## 2022-07-30 LAB — CBC
HCT: 33.8 % — ABNORMAL LOW (ref 36.0–46.0)
Hemoglobin: 11.5 g/dL — ABNORMAL LOW (ref 12.0–15.0)
MCH: 25.9 pg — ABNORMAL LOW (ref 26.0–34.0)
MCHC: 34 g/dL (ref 30.0–36.0)
MCV: 76.1 fL — ABNORMAL LOW (ref 80.0–100.0)
Platelets: 198 10*3/uL (ref 150–400)
RBC: 4.44 MIL/uL (ref 3.87–5.11)
RDW: 16.2 % — ABNORMAL HIGH (ref 11.5–15.5)
WBC: 5.6 10*3/uL (ref 4.0–10.5)
nRBC: 0 % (ref 0.0–0.2)

## 2022-07-30 LAB — BASIC METABOLIC PANEL
Anion gap: 9 (ref 5–15)
BUN: 61 mg/dL — ABNORMAL HIGH (ref 8–23)
CO2: 27 mmol/L (ref 22–32)
Calcium: 9.2 mg/dL (ref 8.9–10.3)
Chloride: 103 mmol/L (ref 98–111)
Creatinine, Ser: 2.7 mg/dL — ABNORMAL HIGH (ref 0.44–1.00)
GFR, Estimated: 16 mL/min — ABNORMAL LOW (ref >60)
Glucose, Bld: 102 mg/dL — ABNORMAL HIGH (ref 70–99)
Potassium: 4.1 mmol/L (ref 3.5–5.1)
Sodium: 139 mmol/L (ref 135–145)

## 2022-07-30 LAB — GLUCOSE, CAPILLARY: Glucose-Capillary: 106 mg/dL — ABNORMAL HIGH (ref 70–99)

## 2022-07-30 MED ORDER — ADULT MULTIVITAMIN W/MINERALS CH: 1.0000 | ORAL_TABLET | Freq: Every day | ORAL | 0 refills | Status: DC

## 2022-07-30 MED ORDER — FUROSEMIDE 20 MG PO TABS: 10.0000 mg | ORAL_TABLET | Freq: Every day | ORAL | 0 refills | Status: DC

## 2022-07-30 MED ORDER — ENSURE ENLIVE PO LIQD: 237.0000 mL | Freq: Two times a day (BID) | ORAL | 12 refills | Status: DC

## 2022-07-30 NOTE — Progress Notes (Signed)
Mobility Specialist - Progress Note   07/30/22 0928  Mobility  Activity Ambulated independently in room;Ambulated independently to bathroom;Stood at bedside;Dangled on edge of bed  Level of Assistance Independent  Assistive Device None  Distance Ambulated (ft) 10 ft  Activity Response Tolerated well  Mobility Referral Yes  $Mobility charge 1 Mobility   Author responds to bed alarm. Pt EOB requesting assistance. Pt dons clothes indep and ambulates to and from restroom. Pt left EOB with needs in reach and bed alarm on.   Gretchen Short  Mobility Specialist  07/30/22 9:30 AM

## 2022-07-30 NOTE — Progress Notes (Signed)
   Heart Failure Nurse Navigator Note  Met with patient today, she is being discharged home she is currently sitting on the edge of the bed dressed ready to go.  She feels that she is back to her baseline.  Discussed signing up for the ventricle health program, patient states at this time that she has a lot on her plate and would like to think about it, told her that when she sees Darylene Price in the outpatient heart failure clinic if she decides that she wants to we can sign her up at that time.  She had no further questions.  Pricilla Riffle RN [facility]FN

## 2022-07-30 NOTE — Discharge Summary (Signed)
Physician Discharge Summary   Patient: Jordan Thornton MRN: [id number] DOB: 02-15-1930  Admit date:     07/27/2022  Discharge date: 07/30/22  Discharge Physician: Fritzi Mandes   PCP: Juline Patch, MD   Recommendations at discharge:   follow-up Dr. Clayborn Bigness in 1 to 2 weeks keep your appointment with nephrology Dr. Zollie Scale follow-up PCP in 1 to 2 weeks  Discharge Diagnoses: Principal Problem:   Acute on chronic systolic CHF (congestive heart failure) ([place]) Active Problems:   Elevated troponin   CKD (chronic kidney disease), stage IV ([place])   Hypertension   Type 2 diabetes mellitus with hyperlipidemia ([place])   Anemia due to chronic kidney disease   Depression with anxiety   Hyperlipidemia   Gastro-esophageal reflux disease without esophagitis    Hospital Course: 88 year old female with history of systolic heart failure of 30 to 35%, stage IV CKD, diabetes mellitus, and hypertension who presented to the emergency room on 11/13 with complaints of orthopnea.  BNP greater than 4500.  Surprising, patient not hypoxic.  Started on IV Lasix and cardiology was consulted.  Initial troponin of 300 and peaked as high as 900 by evening of 11/13.  Patient admitted to the hospitalist service.    Assessment and Plan: Jordan Thornton is a 75yoF with a PMH of HFrEF (30-35%, LVH, g1dd), CAD (NSTEMI 03/2022, treated medically), CKD 4 (baseline Cr 1.98-2.2), DM2, HTN, HLD who presented to [facility] ED 07/27/2022 with worsening shortness of breath over the past 3-4 days with associated PND.  BNP was 4500    Acute on chronic systolic CHF (congestive heart failure) [facility]) Appreciate cardiology help.  Repeat echocardiogram results pending.  Responding well to Lasix and has diuresed almost 4.1 L.  Not hypoxic. -- received IV lasix couple doses.Creatinine slight bump. -- Patient follows with Dr. Holley Raring as outpatient she is recommended to keep appointment with him. -- Currently on room air. I have resumed  her home dose Lasix 10 mg daily from 18 November   Elevated troponin Elevated troponins in the setting of acute heart failure plus chronic renal failure.  Peaked at 900 and trending downward.  Cardiology following.  No evidence of acute ischemia at this time. -- Per cardiology continue IV heparin drip for 48 hours--now d/ced --pt has no cp   CKD (chronic kidney disease), stage IV (HCC) -- Creat 2.2--2.5 --follows with dr Holley Raring   Hypertension - Metoprolol succinate  -- stable   Type 2 diabetes mellitus with hyperlipidemia (HCC) - Insulin SSI with at bedtime coverage ordered, CBG stable --resume [place]   Anemia due to chronic kidney disease Hemoglobin stable   Depression with anxiety - Resumed home lorazepam 0.5 mg p.o. every 6 hours as needed for anxiety   Hyperlipidemia - Atorvastatin    All overall hemodynamically stable. Patient improving. Ambulates by herself. Overall at baseline. Will discharge to home.   Family communication :family at bedside Consults :[facility] cardiology CODE STATUS: FULL     Disposition: Home Diet recommendation:  Discharge Diet Orders (From admission, onward)     Start     Ordered   07/30/22 0000  Diet - low sodium heart healthy        07/30/22 0952           Cardiac and Carb modified diet DISCHARGE MEDICATION: Allergies as of 07/30/2022       Reactions   Sertraline    Made her more depressed        Medication List  TAKE these medications    Advocate Lancets Misc use once daily as directed to test blood sugar   Advocate Lancing Device Misc use once daily as directed to test blood sugar   aspirin EC 81 MG tablet Take 1 tablet (81 mg total) by mouth daily. Swallow whole. What changed: Another medication with the same name was removed. Continue taking this medication, and follow the directions you see here.   atorvastatin 40 MG tablet Commonly known as: LIPITOR Take 1 tablet (40 mg total) by mouth every evening.    clopidogrel 75 MG tablet Commonly known as: PLAVIX Take 1 tablet (75 mg total) by mouth daily.   Elderberry 575 MG/5ML Syrp Take 1 Dose by mouth daily.   feeding supplement Liqd Take 237 mLs by mouth 2 (two) times daily between meals.   Fish Oil 1000 MG Caps Take 1 capsule (1,000 mg total) by mouth daily.   FreeStyle [employer] use once daily as directed to test blood sugar   FREESTYLE LITE test strip Generic drug: glucose blood USE 1 STRIP TO CHECK GLUCOSE ONCE DAILY AS DIRECTED   furosemide 20 MG tablet Commonly known as: Lasix Take 0.5 tablets (10 mg total) by mouth daily. Start taking on: August 01, 2022 What changed:  how much to take These instructions start on August 01, 2022. If you are unsure what to do until then, ask your doctor or other care provider.   hydrALAZINE 25 MG tablet Commonly known as: APRESOLINE Take 12.5 mg by mouth 2 (two) times daily.   isosorbide mononitrate 30 MG 24 hr tablet Commonly known as: IMDUR Take 30 mg by mouth daily.   Januvia 25 MG tablet Generic drug: sitaGLIPtin Take 1 tablet (25 mg total) by mouth daily.   lansoprazole 15 MG capsule Commonly known as: PREVACID Take 1 capsule by mouth once daily   LORazepam 0.5 MG tablet Commonly known as: ATIVAN Take 1 tablet (0.5 mg total) by mouth as needed. Per 30 days   metoprolol succinate 25 MG 24 hr tablet Commonly known as: TOPROL-XL Take 0.5 tablets (12.5 mg total) by mouth at bedtime.   multivitamin with minerals Tabs tablet Take 1 tablet by mouth daily.   TYLENOL ARTHRITIS PAIN PO Take 2 tablets by mouth daily as needed.        Follow-up Information     [place], Loran Senters, MD. Go in 7 day(s).   Specialties: Cardiology, Internal Medicine Contact information: [place] [address] [phone]         Anthonette Legato, MD. Go in 7 day(s).   Specialty: Nephrology Contact information: [place]  [address] [phone]         Juline Patch, MD. Schedule an appointment as soon as possible for a visit in 1 week(s).   Specialty: Family Medicine Why: Ronney Asters information: [address] [place] [address] [phone]                Discharge Exam: Danley Danker Weights   07/27/22 1354 07/29/22 0500 07/30/22 0500  Weight: 55.2 kg 55.6 kg 49.7 kg     Condition at discharge: fair  The results of significant diagnostics from this hospitalization (including imaging, microbiology, ancillary and laboratory) are listed below for reference.   Imaging Studies: ECHOCARDIOGRAM COMPLETE  Result Date: 07/28/2022    ECHOCARDIOGRAM REPORT   Patient Name:   Jordan Thornton Date of Exam: 07/28/2022 Medical Rec #:  [id number]  Height:       65.0 in Accession #:    [id number]             Weight:       121.7 lb Date of Birth:  05-Mar-1930               BSA:          1.602 m Patient Age:    41 years               BP:           Not listed in chart/Not                                                      listed in chart mmHg Patient Gender: F                      HR:           103 bpm. Exam Location:  [facility] Procedure: 2D Echo, Cardiac Doppler and Color Doppler Indications:     CHF-acute systolic O70.96  History:         Patient has prior history of Echocardiogram examinations, most                  recent 04/05/2022. CHF; Risk Factors:Hypertension and                  Dyslipidemia.  Sonographer:     Sherrie Sport Referring Phys:  [id number] [place] TANG Diagnosing Phys: Yolonda Kida MD  Sonographer Comments: Image quality was good. IMPRESSIONS  1. Left ventricular ejection fraction, by estimation, is 25 to 30%. The left ventricle has severely decreased function. The left ventricle demonstrates global hypokinesis. The left ventricular internal cavity size was mildly dilated. Left ventricular diastolic parameters are consistent with Grade I diastolic dysfunction (impaired  relaxation).  2. Right ventricular systolic function is low normal. The right ventricular size is mildly enlarged.  3. The mitral valve is normal in structure. Mild mitral valve regurgitation.  4. The aortic valve is grossly normal. Aortic valve regurgitation is trivial. FINDINGS  Left Ventricle: Left ventricular ejection fraction, by estimation, is 25 to 30%. The left ventricle has severely decreased function. The left ventricle demonstrates global hypokinesis. The left ventricular internal cavity size was mildly dilated. There is no left ventricular hypertrophy. Left ventricular diastolic parameters are consistent with Grade I diastolic dysfunction (impaired relaxation). Right Ventricle: The right ventricular size is mildly enlarged. No increase in right ventricular wall thickness. Right ventricular systolic function is low normal. Left Atrium: Left atrial size was normal in size. Right Atrium: Right atrial size was normal in size. Pericardium: There is no evidence of pericardial effusion. Mitral Valve: The mitral valve is normal in structure. Mild mitral valve regurgitation. Tricuspid Valve: The tricuspid valve is normal in structure. Tricuspid valve regurgitation is mild. Aortic Valve: The aortic valve is grossly normal. Aortic valve regurgitation is trivial. Aortic valve mean gradient measures 2.0 mmHg. Aortic valve peak gradient measures 2.9 mmHg. Aortic valve area, by VTI measures 1.91 cm. Pulmonic Valve: The pulmonic valve was normal in structure. Pulmonic valve regurgitation is not visualized. Aorta: The ascending aorta was not well visualized. IAS/Shunts: No atrial level shunt detected by color flow Doppler.  LEFT VENTRICLE PLAX 2D LVIDd:  4.90 cm      Diastology LVIDs:         4.30 cm      LV e' medial:    3.26 cm/s LV PW:         1.50 cm      LV E/e' medial:  19.3 LV IVS:        1.30 cm      LV e' lateral:   4.46 cm/s LVOT diam:     2.00 cm      LV E/e' lateral: 14.1 LV SV:         23 LV SV  Index:   14 LVOT Area:     3.14 cm  LV Volumes (MOD) LV vol d, MOD A2C: 185.0 ml LV vol d, MOD A4C: 151.0 ml LV vol s, MOD A2C: 150.0 ml LV vol s, MOD A4C: 142.0 ml LV SV MOD A2C:     35.0 ml LV SV MOD A4C:     151.0 ml LV SV MOD BP:      18.5 ml RIGHT VENTRICLE RV Basal diam:  3.50 cm RV Mid diam:    3.10 cm RV S prime:     8.27 cm/s TAPSE (M-mode): 2.5 cm LEFT ATRIUM           Index        RIGHT ATRIUM           Index LA diam:      3.40 cm 2.12 cm/m   RA Area:     10.70 cm LA Vol (A2C): 91.5 ml 57.13 ml/m  RA Volume:   25.70 ml  16.05 ml/m LA Vol (A4C): 29.2 ml 18.23 ml/m  AORTIC VALVE AV Area (Vmax):    1.82 cm AV Area (Vmean):   1.82 cm AV Area (VTI):     1.91 cm AV Vmax:           85.65 cm/s AV Vmean:          59.750 cm/s AV VTI:            0.120 m AV Peak Grad:      2.9 mmHg AV Mean Grad:      2.0 mmHg LVOT Vmax:         49.50 cm/s LVOT Vmean:        34.700 cm/s LVOT VTI:          0.073 m LVOT/AV VTI ratio: 0.61  AORTA Ao Root diam: 3.05 cm MITRAL VALVE               TRICUSPID VALVE MV Area (PHT): 7.66 cm    TR Peak grad:   8.8 mmHg MV Decel Time: 99 msec     TR Vmax:        148.00 cm/s MV E velocity: 62.90 cm/s MV A velocity: 97.50 cm/s  SHUNTS MV E/A ratio:  0.65        Systemic VTI:  0.07 m                            Systemic Diam: 2.00 cm Yolonda Kida MD Electronically signed by Yolonda Kida MD Signature Date/Time: 07/28/2022/11:30:29 PM    Final    DG Chest 2 View  Result Date: 07/27/2022 CLINICAL DATA:  Shortness of breath. EXAM: CHEST - 2 VIEW COMPARISON:  April 03, 2022. FINDINGS: Stable cardiomegaly. Mild bibasilar subsegmental atelectasis or possibly edema is noted with small bilateral  pleural effusions. Bony thorax is unremarkable. IMPRESSION: Mild bibasilar subsegmental atelectasis or possibly edema is noted with small bilateral pleural effusions. Electronically Signed   By: Marijo Conception M.D.   On: 07/27/2022 12:49    Microbiology: Results for orders placed or  performed during the hospital encounter of 01/31/22  Urine Culture     Status: Abnormal   Collection Time: 01/31/22  1:56 PM   Specimen: Urine, Random  Result Value Ref Range Status   Specimen Description   Final    URINE, RANDOM Performed at [facility], [address] [place], [place] [address]    Special Requests   Final    NONE Performed at [facility], [address] [place], [place] [address]    Culture >=100,000 COLONIES/mL ESCHERICHIA COLI (A)  Final   Report Status 02/02/2022 FINAL  Final   Organism ID, Bacteria ESCHERICHIA COLI (A)  Final      Susceptibility   Escherichia coli - MIC*    AMPICILLIN >=32 RESISTANT Resistant     CEFAZOLIN >=64 RESISTANT Resistant     CEFEPIME <=0.12 SENSITIVE Sensitive     CEFTRIAXONE <=0.25 SENSITIVE Sensitive     CIPROFLOXACIN <=0.25 SENSITIVE Sensitive     GENTAMICIN <=1 SENSITIVE Sensitive     IMIPENEM <=0.25 SENSITIVE Sensitive     NITROFURANTOIN <=16 SENSITIVE Sensitive     TRIMETH/SULFA <=20 SENSITIVE Sensitive     AMPICILLIN/SULBACTAM >=32 RESISTANT Resistant     PIP/TAZO <=4 SENSITIVE Sensitive     * >=100,000 COLONIES/mL ESCHERICHIA COLI  Blood culture (routine x 2)     Status: None   Collection Time: 01/31/22  2:00 PM   Specimen: BLOOD  Result Value Ref Range Status   Specimen Description BLOOD [facility]  Final   Special Requests BOTTLES DRAWN AEROBIC AND ANAEROBIC BCHV  Final   Culture   Final    NO GROWTH 5 DAYS Performed at [facility], [address] [place], [place] [address]    Report Status 02/05/2022 FINAL  Final  Blood culture (routine x 2)     Status: None   Collection Time: 01/31/22  2:00 PM   Specimen: BLOOD  Result Value Ref Range Status   Specimen Description BLOOD RW  Final   Special Requests BOTTLES DRAWN AEROBIC AND ANAEROBIC BCHV  Final   Culture   Final    NO GROWTH 5 DAYS Performed at [facility], [address] [place], [place] [address]    Report  Status 02/05/2022 FINAL  Final    Labs: CBC: Recent Labs  Lab 07/27/22 1212 07/28/22 0153 07/29/22 0453 07/30/22 0604  WBC 8.0 6.9 7.2 5.6  NEUTROABS 6.5  --   --   --   HGB 11.1* 10.3* 12.2 11.5*  HCT 33.2* 30.3* 34.9* 33.8*  MCV 77.8* 76.9* 75.4* 76.1*  PLT 166 161 161 564   Basic Metabolic Panel: Recent Labs  Lab 07/27/22 1212 07/27/22 1722 07/28/22 0153 07/29/22 0453 07/30/22 0604  NA 142  --  142 141 139  K 4.5  --  3.7 3.7 4.1  CL 111  --  109 104 103  CO2 21*  --  23 25 27   GLUCOSE 139*  --  102* 105* 102*  BUN 50*  --  51* 57* 61*  CREATININE 2.44*  --  2.30* 2.43* 2.70*  CALCIUM 9.4  --  9.0 9.4 9.2  MG  --  2.2  --   --   --  Liver Function Tests: No results for input(s): "AST", "ALT", "ALKPHOS", "BILITOT", "PROT", "ALBUMIN" in the last 168 hours. CBG: Recent Labs  Lab 07/29/22 0741 07/29/22 1218 07/29/22 1713 07/29/22 2151 07/30/22 0851  GLUCAP 108* 165* 88 104* 106*    Discharge time spent: greater than 30 minutes.  Signed: Fritzi Mandes, MD [facility] 07/30/2022

## 2022-07-30 NOTE — Consult Note (Signed)
   [facility] Inpatient Consult   07/30/2022  [place] McCoy Debroux 1929/10/24 [id number]  [place] [facility] [ACO] Patient: [facility] Liaison remote coverage review for [facility]   Primary Care Provider:  Juline Patch, MD with [facility] which is listed to provide the transition of care follow up.   Patient screened for 3 hospitalization in the past 6 months with noted high risk score for unplanned readmission risk and to assess for potential [place] Management service needs for post hospital transition for care coordination.  Review of patient's electronic medical record with recent new HF diagnosis reveals patient is has transitioned home but has had outreaches with [place] [facility] for community follow up.   Plan:   Referral request for community care coordination: for closer community care coordination follow up for HF diagnosis  Of note, [place] does not replace or interfere with any arrangements made by the Inpatient Transition of Care team.  For questions contact:   Natividad Brood, RN BSN [place]  [phone] business mobile phone Toll free office [phone]  *[place]  [phone] Fax number: [phone] [email or web address]    .

## 2022-07-30 NOTE — Progress Notes (Signed)
Mobility Specialist - Progress Note   07/30/22 1033  Mobility  Activity Ambulated independently in room  Level of Assistance Independent  Assistive Device None  Distance Ambulated (ft) 20 ft  Activity Response Tolerated well  Mobility Referral Yes  $Mobility charge 1 Mobility   Pt ambulating in room upon entry, utilizing RA. Pt ambulated approximately 20 in the room with MS present, tolerated well. Pt sat EOB, left sitting EOB with needs within reach awaiting D/C.   Candie Mile Mobility Specialist 07/30/22 10:36 AM

## 2022-07-31 ENCOUNTER — Telehealth: Payer: Self-pay

## 2022-07-31 ENCOUNTER — Telehealth: Payer: Self-pay | Admitting: Family Medicine

## 2022-07-31 ENCOUNTER — Telehealth: Payer: Self-pay | Admitting: *Deleted

## 2022-07-31 NOTE — Progress Notes (Signed)
  Care Coordination   Note   07/31/2022 Name: [place] McCoy Dieu MRN: [id number] DOB: 10/26/1929  [place] McCoy Becvar is a 88 y.o. year old female who sees Juline Patch, MD for primary care. I reached out to [place] McCoy Fister by phone today to offer care coordination services.  Referral received   Ms. Fennelly was given information about Care Coordination services today including:   The Care Coordination services include support from the care team which includes your Nurse Coordinator, Clinical Social Worker, or Pharmacist.  The Care Coordination team is here to help remove barriers to the health concerns and goals most important to you. Care Coordination services are voluntary, and the patient may decline or stop services at any time by request to their care team member.   Care Coordination Consent Status: Patient agreed to services and verbal consent obtained.   Follow up plan:  Telephone appointment with care coordination team member scheduled for:  08/13/2022  Encounter Outcome:  Pt. Scheduled from referral   Julian Hy, [place] Direct Dial: [phone]

## 2022-07-31 NOTE — Telephone Encounter (Signed)
Please advise 

## 2022-07-31 NOTE — Telephone Encounter (Signed)
Copied from [place] [id number]. Topic: Appointment Scheduling - Scheduling Inquiry for Clinic >> Jul 31, 2022 10:40 AM Cyndi Bender wrote: Reason for CRM: Pt requested hospital fu appt but there are no appts available within the next 2 weeks. Please contact pt to schedule hospital fu appt.

## 2022-07-31 NOTE — Telephone Encounter (Signed)
Transition Care Management Follow-up Telephone Call Date of discharge and from where: 07/30/2022/ [facility] How have you been since you were released from the hospital? So far, so good Any questions or concerns? No  Items Reviewed: Did the pt receive and understand the discharge instructions provided? Yes  Medications obtained and verified? Yes  Other? No  Any new allergies since your discharge? No  Dietary orders reviewed? Yes Do you have support at home? Yes   Home Care and Equipment/Supplies: no Functional Questionnaire: (I = Independent and D = Dependent) ADLs: I  Bathing/Dressing- I  Meal Prep- I  Eating- I  Maintaining continence- I  Transferring/Ambulation- I  Managing Meds- I  Follow up appointments reviewed:  PCP Hospital f/u appt confirmed? Yes  Scheduled to see Dr Ronnald Ramp on Dec7 @ 140. [facility] f/u appt confirmed? Yes  Scheduled to see Dr Clayborn Bigness on pt has got to call  Are transportation arrangements needed? No  If their condition worsens, is the pt aware to call PCP or go to the Emergency Dept.? Yes Was the patient provided with contact information for the PCP's office or ED? Yes Was to pt encouraged to call back with questions or concerns? Yes

## 2022-08-03 ENCOUNTER — Emergency Department
Admission: EM | Admit: 2022-08-03 | Discharge: 2022-08-03 | Disposition: A | Discharge status: Home / Self Care | Payer: PPO | Attending: Emergency Medicine | Admitting: Emergency Medicine

## 2022-08-03 ENCOUNTER — Emergency Department: Payer: PPO

## 2022-08-03 ENCOUNTER — Other Ambulatory Visit: Payer: Self-pay

## 2022-08-03 ENCOUNTER — Encounter: Payer: Self-pay | Admitting: Emergency Medicine

## 2022-08-03 DIAGNOSIS — M5442 Lumbago with sciatica, left side: Secondary | ICD-10-CM | POA: Diagnosis not present

## 2022-08-03 DIAGNOSIS — M5432 Sciatica, left side: Secondary | ICD-10-CM

## 2022-08-03 DIAGNOSIS — M79605 Pain in left leg: Secondary | ICD-10-CM | POA: Diagnosis present

## 2022-08-03 DIAGNOSIS — M79662 Pain in left lower leg: Secondary | ICD-10-CM | POA: Diagnosis not present

## 2022-08-03 MED ORDER — HYDROCODONE-ACETAMINOPHEN 5-325 MG PO TABS: 1.0000 | ORAL_TABLET | Freq: Four times a day (QID) | ORAL | 0 refills | Status: DC | PRN

## 2022-08-03 MED ORDER — KETOROLAC TROMETHAMINE 15 MG/ML IJ SOLN
15.0000 mg | Freq: Once | INTRAMUSCULAR | Status: AC
  Administered 2022-08-03: 15 mg via INTRAMUSCULAR
  Filled 2022-08-03: qty 1

## 2022-08-03 MED ORDER — HYDROCODONE-ACETAMINOPHEN 5-325 MG PO TABS
1.0000 | ORAL_TABLET | Freq: Once | ORAL | Status: AC
  Administered 2022-08-03: 1 via ORAL
  Filled 2022-08-03: qty 1

## 2022-08-03 NOTE — Discharge Instructions (Addendum)
Please take laxatives or stool softeners or both when you take this medication as it can make you constipated.  It can make you sleepy and a little bit woozy, taken as necessary

## 2022-08-03 NOTE — ED Triage Notes (Signed)
Pt presents to the ED via POV due to L leg pain that started Sunday. Pt states "it feels like sciatic". Pt denies CP, SOB, NVD. Pt currently on plavix. Pt A&Ox4

## 2022-08-03 NOTE — ED Provider Triage Note (Signed)
Emergency Medicine Provider Triage Evaluation Note  Jordan Thornton , a 88 y.o. female  was evaluated in triage.  Pt complains of neck pain, leg pain.  She states that it is similar to when she has had sciatica.  Patient was admitted for shortness of breath 2 weeks ago.  States left calf is tender.  Review of Systems  Positive:  Negative:   Physical Exam  There were no vitals taken for this visit. Gen:   Awake, no distress   Resp:  Normal effort  MSK:   Moves extremities without difficulty, left calf and upper thigh tender to palpation Other:    Medical Decision Making  Medically screening exam initiated at 1:02 PM.  Appropriate orders placed.  Jordan Thornton was informed that the remainder of the evaluation will be completed by another provider, this initial triage assessment does not replace that evaluation, and the importance of remaining in the ED until their evaluation is complete.     Versie Starks, PA-C 08/03/22 1304

## 2022-08-03 NOTE — ED Provider Notes (Signed)
[facility] Provider Note    Event Date/Time   First MD Initiated Contact with Patient 08/03/22 1627     (approximate)   History   Leg Pain   HPI  Jordan Thornton is a 88 y.o. female with a history of sciatica presents with complaints of left low back pain radiating through her buttocks and into her left leg.  She reports this has been going on today.  She has had this once before as well.  She felt tingling in her leg but no weakness     Physical Exam   Triage Vital Signs: ED Triage Vitals  Enc Vitals Group     BP 08/03/22 1304 113/74     Pulse Rate 08/03/22 1304 77     Resp 08/03/22 1304 18     Temp 08/03/22 1304 97.9 F (36.6 C)     Temp Source 08/03/22 1304 Oral     SpO2 08/03/22 1304 100 %     Weight 08/03/22 1305 49.7 kg (109 lb 9.1 oz)     Height 08/03/22 1305 1.651 m (5\' 5" )     Head Circumference --      Peak Flow --      Pain Score 08/03/22 1305 8     Pain Loc --      Pain Edu? --      Excl. in [place]? --     Most recent vital signs: Vitals:   08/03/22 1703 08/03/22 1727  BP: 130/78 132/70  Pulse: 80 78  Resp: 14 16  Temp:  98 F (36.7 C)  SpO2: 94% 95%     General: Awake, no distress.  CV:  Good peripheral perfusion.  Resp:  Normal effort.  Abd:  No distention.  Other:  Normal strength in the lower extremity, no saddle anesthesia, warm and well perfused   ED Results / Procedures / Treatments   Labs (all labs ordered are listed, but only abnormal results are displayed) Labs Reviewed - No data to display   EKG     RADIOLOGY     PROCEDURES:  Critical Care performed:   Procedures   MEDICATIONS ORDERED IN ED: Medications  HYDROcodone-acetaminophen (NORCO/VICODIN) 5-325 MG per tablet 1 tablet (1 tablet Oral Given 08/03/22 1654)  ketorolac (TORADOL) 15 MG/ML injection 15 mg (15 mg Intramuscular Given 08/03/22 1655)     IMPRESSION / MDM / ASSESSMENT AND PLAN / ED COURSE  I reviewed the triage  vital signs and the nursing notes. Patient's presentation is most consistent with exacerbation of chronic illness.  Patient presents with pain in the low back with radiation to the left leg as detailed above.  Most consistent with sciatica as detailed above, no evidence of arterial issue, no trauma.  DVT ultrasound negative  Patient treated with Vicodin with significant improvement.  After treatment she is sitting up and walking around the room without difficulty, she would like to go home I feel that is appropriate, no indication for admission at this time, will refer to neurosurgery, return precautions discussed        FINAL CLINICAL IMPRESSION(S) / ED DIAGNOSES   Final diagnoses:  Sciatica of left side     Rx / DC Orders   ED Discharge Orders          Ordered    HYDROcodone-acetaminophen (NORCO/VICODIN) 5-325 MG tablet  Every 6 hours PRN,   Status:  Discontinued        08/03/22 1730    HYDROcodone-acetaminophen (  NORCO/VICODIN) 5-325 MG tablet  Every 6 hours PRN        08/03/22 1730             Note:  This document was prepared using Dragon voice recognition software and may include unintentional dictation errors.   Lavonia Drafts, MD 08/03/22 [phone]

## 2022-08-05 ENCOUNTER — Ambulatory Visit
Admission: RE | Admit: 2022-08-05 | Discharge: 2022-08-05 | Disposition: A | Discharge status: Home / Self Care | Payer: PPO | Attending: Family Medicine | Admitting: Family Medicine

## 2022-08-05 ENCOUNTER — Ambulatory Visit
Admission: RE | Admit: 2022-08-05 | Discharge: 2022-08-05 | Disposition: A | Discharge status: Home / Self Care | Payer: PPO | Source: Ambulatory Visit | Attending: Family Medicine | Admitting: Family Medicine

## 2022-08-05 ENCOUNTER — Encounter: Payer: Self-pay | Admitting: Family Medicine

## 2022-08-05 ENCOUNTER — Ambulatory Visit (INDEPENDENT_AMBULATORY_CARE_PROVIDER_SITE_OTHER): Payer: PPO | Admitting: Family Medicine

## 2022-08-05 ENCOUNTER — Other Ambulatory Visit: Payer: PPO

## 2022-08-05 VITALS — BP 100/60 | HR 85 | Ht 65.0 in | Wt 117.0 lb

## 2022-08-05 DIAGNOSIS — E119 Type 2 diabetes mellitus without complications: Secondary | ICD-10-CM | POA: Diagnosis not present

## 2022-08-05 DIAGNOSIS — M4316 Spondylolisthesis, lumbar region: Secondary | ICD-10-CM | POA: Diagnosis not present

## 2022-08-05 DIAGNOSIS — M545 Low back pain, unspecified: Secondary | ICD-10-CM | POA: Diagnosis not present

## 2022-08-05 DIAGNOSIS — M5432 Sciatica, left side: Secondary | ICD-10-CM

## 2022-08-05 MED ORDER — TRAMADOL HCL 50 MG PO TABS: 50.0000 mg | ORAL_TABLET | Freq: Three times a day (TID) | ORAL | 0 refills | Status: AC | PRN

## 2022-08-05 MED ORDER — FREESTYLE LITE TEST VI STRP: ORAL_STRIP | 1 refills | Status: DC

## 2022-08-05 MED ORDER — PREDNISONE 10 MG PO TABS: 10.0000 mg | ORAL_TABLET | Freq: Every day | ORAL | 0 refills | Status: DC

## 2022-08-05 NOTE — Progress Notes (Signed)
Date:  08/05/2022   Name:  Jordan Thornton   DOB:  [birth year]   MRN:  [id number]   Chief Complaint: Hospitalization Follow-up (CHF on 07/27/22 and D/c 11/16- TOC placed on 07/31/22)  HPI  Lab Results  Component Value Date   NA 139 07/30/2022   K 4.1 07/30/2022   CO2 27 07/30/2022   GLUCOSE 102 (H) 07/30/2022   BUN 61 (H) 07/30/2022   CREATININE 2.70 (H) 07/30/2022   CALCIUM 9.2 07/30/2022   EGFR 27 (L) 07/03/2021   GFRNONAA 16 (L) 07/30/2022   Lab Results  Component Value Date   CHOL 197 04/05/2022   HDL 67 04/05/2022   LDLCALC 117 (H) 04/05/2022   TRIG 67 04/05/2022   CHOLHDL 2.9 04/05/2022   No results found for: "TSH" Lab Results  Component Value Date   HGBA1C 6.7 (H) 07/02/2022   Lab Results  Component Value Date   WBC 5.6 07/30/2022   HGB 11.5 (L) 07/30/2022   HCT 33.8 (L) 07/30/2022   MCV 76.1 (L) 07/30/2022   PLT 198 07/30/2022   Lab Results  Component Value Date   ALT 15 01/31/2022   AST 19 01/31/2022   ALKPHOS 53 01/31/2022   BILITOT 1.2 01/31/2022   No results found for: "25OHVITD2", "25OHVITD3", "VD25OH"   Review of Systems  Constitutional: Negative.  Negative for chills, fatigue, fever and unexpected weight change.  HENT:  Negative for congestion, ear discharge, ear pain, rhinorrhea, sinus pressure, sneezing and sore throat.   Respiratory:  Negative for cough, shortness of breath, wheezing and stridor.   Gastrointestinal:  Negative for abdominal pain, blood in stool, constipation, diarrhea and nausea.  Genitourinary:  Negative for dysuria, flank pain, frequency, hematuria, urgency and vaginal discharge.  Musculoskeletal:  Negative for arthralgias, back pain and myalgias.  Skin:  Negative for rash.  Neurological:  Negative for dizziness, weakness and headaches.  Hematological:  Negative for adenopathy. Does not bruise/bleed easily.  Psychiatric/Behavioral:  Negative for dysphoric mood. The patient is not nervous/anxious.     Patient  Active Problem List   Diagnosis Date Noted   CKD (chronic kidney disease), stage IV ([place]) 07/28/2022   Anemia due to chronic kidney disease 07/28/2022   Acute kidney injury superimposed on CKD ([place]) 04/06/2022   Acute on chronic systolic CHF (congestive heart failure) ([place]) 04/04/2022   NSTEMI (non-ST elevated myocardial infarction) ([place]) 04/04/2022   Hypotension 04/04/2022   Abnormal chest x-ray    Syncope 01/31/2022   Type 2 diabetes mellitus with hyperlipidemia ([place]) 01/31/2022   Depression with anxiety 01/31/2022   Chronic diastolic CHF (congestive heart failure) ([place]) 01/31/2022   Urinary tract infection, acute 01/31/2022   Elevated troponin 01/31/2022   H/O total hysterectomy with bilateral salpingo-oophorectomy (BSO) 10/29/2020   Anxiety 10/29/2020   Acute hypoxemic respiratory failure due to COVID-19 ([place]) 09/19/2019   Stage 3 chronic kidney disease ([place]) 09/02/2019   Diabetes mellitus with no complication ([place]) 24/23/5361   Familial multiple lipoprotein-type hyperlipidemia 01/03/2015   Creatinine elevation 01/03/2015   Diabetes ([place]) 12/15/2014   Hyperlipidemia 12/15/2014   Hypertension 12/15/2014   Gastro-esophageal reflux disease without esophagitis 12/15/2014    Allergies  Allergen Reactions   Sertraline     Made her more depressed    Past Surgical History:  Procedure Laterality Date   bladder tack     BREAST BIOPSY Left 10-15 yrs ago   benign   CATARACT EXTRACTION Bilateral 05/2015   CHOLECYSTECTOMY OPEN     VAGINAL HYSTERECTOMY  Social History   Tobacco Use   Smoking status: Former    Packs/day: 0.25    Years: 25.00    Total pack years: 6.25    Types: Cigarettes    Quit date: 1982    Years since quitting: 41.9   Smokeless tobacco: Never   Tobacco comments:    smoking cessation materials not required  Vaping Use   Vaping Use: Never used  Substance Use Topics   Alcohol use: No    Alcohol/week: 0.0 standard drinks of alcohol   Drug use: No      Medication list has been reviewed and updated.  Current Meds  Medication Sig   Acetaminophen (TYLENOL ARTHRITIS PAIN PO) Take 2 tablets by mouth daily as needed.    ADVOCATE LANCETS MISC use once daily as directed to test blood sugar   aspirin EC 81 MG tablet Take 1 tablet (81 mg total) by mouth daily. Swallow whole.   atorvastatin (LIPITOR) 40 MG tablet Take 1 tablet (40 mg total) by mouth every evening.   Blood Glucose Monitoring Suppl (FREESTYLE LITE) DEVI use once daily as directed to test blood sugar   clopidogrel (PLAVIX) 75 MG tablet Take 1 tablet (75 mg total) by mouth daily.   Elderberry 575 MG/5ML SYRP Take 1 Dose by mouth daily.    feeding supplement (ENSURE ENLIVE / ENSURE PLUS) LIQD Take 237 mLs by mouth 2 (two) times daily between meals.   furosemide (LASIX) 20 MG tablet Take 0.5 tablets (10 mg total) by mouth daily.   hydrALAZINE (APRESOLINE) 25 MG tablet Take 12.5 mg by mouth 2 (two) times daily.   isosorbide mononitrate (IMDUR) 30 MG 24 hr tablet Take 30 mg by mouth daily.   JANUVIA 25 MG tablet Take 1 tablet (25 mg total) by mouth daily.   Lancet Devices (ADVOCATE LANCING DEVICE) MISC use once daily as directed to test blood sugar   lansoprazole (PREVACID) 15 MG capsule Take 1 capsule by mouth once daily   LORazepam (ATIVAN) 0.5 MG tablet Take 1 tablet (0.5 mg total) by mouth as needed. Per 30 days   metoprolol succinate (TOPROL-XL) 25 MG 24 hr tablet Take 0.5 tablets (12.5 mg total) by mouth at bedtime.   Multiple Vitamin (MULTIVITAMIN WITH MINERALS) TABS tablet Take 1 tablet by mouth daily.   Omega-3 Fatty Acids (FISH OIL) 1000 MG CAPS Take 1 capsule (1,000 mg total) by mouth daily.   predniSONE (DELTASONE) 10 MG tablet Take 1 tablet (10 mg total) by mouth daily with breakfast.   traMADol (ULTRAM) 50 MG tablet Take 1 tablet (50 mg total) by mouth every 8 (eight) hours as needed for up to 5 days.   [DISCONTINUED] FREESTYLE LITE test strip USE 1 STRIP TO CHECK  GLUCOSE ONCE DAILY AS DIRECTED       08/05/2022   11:42 AM 07/02/2022    9:21 AM 04/30/2022   11:34 AM 04/10/2022    2:40 PM  GAD 7 : Generalized Anxiety Score  Nervous, Anxious, on Edge 0 0 0 0  Control/stop worrying 0 0 0 0  Worry too much - different things 0 0 0 0  Trouble relaxing 0 0 0 2  Restless 0 0 0 0  Easily annoyed or irritable 0 0 0 0  Afraid - awful might happen 0 0 0 0  Total GAD 7 Score 0 0 0 2  Anxiety Difficulty Not difficult at all Not difficult at all Not difficult at all Not difficult at all  08/05/2022   11:42 AM 07/02/2022    9:21 AM 04/30/2022   11:33 AM  Depression screen PHQ 2/9  Decreased Interest 0 0 0  Down, Depressed, Hopeless 0 0 0  PHQ - 2 Score 0 0 0  Altered sleeping 0 0   Tired, decreased energy 0 0   Change in appetite 0 0   Feeling bad or failure about yourself  0 0   Trouble concentrating 0 0   Moving slowly or fidgety/restless 0 0   Suicidal thoughts 0 0   PHQ-9 Score 0 0   Difficult doing work/chores Not difficult at all Not difficult at all     BP Readings from Last 3 Encounters:  08/05/22 100/60  08/03/22 132/70  07/30/22 (!) 98/50    Physical Exam HENT:     Right Ear: Tympanic membrane normal.     Left Ear: Tympanic membrane normal.     Nose: Nose normal.     Mouth/Throat:     Mouth: Mucous membranes are moist.  Cardiovascular:     Heart sounds: S1 normal and S2 normal.     No systolic murmur is present.     No diastolic murmur is present.     No gallop. No S3 or S4 sounds.  Pulmonary:     Breath sounds: No decreased breath sounds, wheezing, rhonchi or rales.  Musculoskeletal:     Cervical back: Neck supple.     Lumbar back: Spasms present. No tenderness.     Right lower leg: No edema.     Left lower leg: No edema.  Neurological:     Mental Status: She is alert.     Wt Readings from Last 3 Encounters:  08/05/22 117 lb (53.1 kg)  08/03/22 109 lb 9.1 oz (49.7 kg)  07/30/22 109 lb 9.1 oz (49.7 kg)     BP 100/60   Pulse 85   Ht _0  (1.651 m)   Wt 117 lb (53.1 kg)   SpO2 99%   BMI 19.47 kg/m   Assessment and Plan:  1. Type 2 diabetes mellitus without complication, without long-term current use of insulin (HCC) Chronic.  Controlled.  Stable.  Will refill freestyle test strips. - glucose blood (FREESTYLE LITE) test strip; Use as instructed  Dispense: 50 each; Refill: 1  2. Sciatica of left side New onset.  Evaluated in ER.  Previous x-ray from 2020 was reviewed and there is some degenerative changes.  We will repeat LS-spine today and for to sports medicine in 2 weeks for reevaluation.  In the meantime since she is not able to take NSAIDs due to her Plavix and recent exacerbation of congestive heart failure we will go with prednisone 10 mg once a day.  And she has been warned this may affect her blood sugar.  Patient was unable to tolerate the Vicodin because it was too strong.  Patient has been given tramadol and recommend taking it 3 times a day I have suggested that she use it only at night.  I am avoiding muscle relaxant given her age and do not want to risk a fall.  Patient is invited to come back on an as-needed basis. - DG Lumbar Spine Complete; Future - predniSONE (DELTASONE) 10 MG tablet; Take 1 tablet (10 mg total) by mouth daily with breakfast.  Dispense: 30 tablet; Refill: 0 - traMADol (ULTRAM) 50 MG tablet; Take 1 tablet (50 mg total) by mouth every 8 (eight) hours as needed for up to 5 days.  Dispense: 15 tablet; Refill: 0    Otilio Miu, MD

## 2022-08-12 ENCOUNTER — Ambulatory Visit: Payer: PPO | Admitting: Family

## 2022-08-12 ENCOUNTER — Telehealth: Payer: Self-pay

## 2022-08-12 ENCOUNTER — Telehealth: Payer: Self-pay | Admitting: *Deleted

## 2022-08-12 DIAGNOSIS — D631 Anemia in chronic kidney disease: Secondary | ICD-10-CM | POA: Diagnosis not present

## 2022-08-12 DIAGNOSIS — N2581 Secondary hyperparathyroidism of renal origin: Secondary | ICD-10-CM | POA: Diagnosis not present

## 2022-08-12 DIAGNOSIS — N179 Acute kidney failure, unspecified: Secondary | ICD-10-CM | POA: Diagnosis not present

## 2022-08-12 DIAGNOSIS — I10 Essential (primary) hypertension: Secondary | ICD-10-CM | POA: Diagnosis not present

## 2022-08-12 DIAGNOSIS — N184 Chronic kidney disease, stage 4 (severe): Secondary | ICD-10-CM | POA: Diagnosis not present

## 2022-08-12 DIAGNOSIS — E1122 Type 2 diabetes mellitus with diabetic chronic kidney disease: Secondary | ICD-10-CM | POA: Diagnosis not present

## 2022-08-12 NOTE — Patient Outreach (Signed)
  Care Coordination   08/12/2022 Name: Jordan Thornton MRN: [id number] DOB: [birth year]   Care Coordination Outreach Attempts:  An unsuccessful telephone outreach was attempted today to offer the patient information about available care coordination services as a benefit of their health plan.   Follow Up Plan:  Additional outreach attempts will be made to offer the patient care coordination information and services.   Encounter Outcome:  No Answer   Care Coordination Interventions:  No, not indicated    Valente David, RN, MSN, [facility] Care Management Care Management Coordinator [phone]

## 2022-08-12 NOTE — Telephone Encounter (Signed)
        Patient  visited [facility] on 11/20   Telephone encounter attempt :  1st  A HIPAA compliant voice message was left requesting a return call.  Instructed patient to call back    Jordan Thornton [facility] Care Guide, [place], Care Management  [phone] [address], [place], [place] [address] Phone: [phone] Email: [email or web address]     

## 2022-08-13 ENCOUNTER — Telehealth: Payer: Self-pay

## 2022-08-13 ENCOUNTER — Encounter: Payer: Self-pay | Admitting: *Deleted

## 2022-08-13 NOTE — Telephone Encounter (Signed)
        Patient  visited [place] on 11/20     Telephone encounter attempt :  2nd  A HIPAA compliant voice message was left requesting a return call.  Instructed patient to call back    [place], [place] Management  [phone] [address], [place], [place] [address] Phone: [phone] Email: [email or web address]

## 2022-08-14 ENCOUNTER — Ambulatory Visit: Payer: Self-pay | Admitting: *Deleted

## 2022-08-14 NOTE — Patient Outreach (Signed)
  Care Coordination   08/14/2022 Name: [place] McCoy Danker MRN: [id number] DOB: 04-Jan-1930   Care Coordination Outreach Attempts:  An unsuccessful telephone outreach was attempted for a scheduled appointment today.  Follow Up Plan:  Additional outreach attempts will be made to offer the patient care coordination information and services.   Encounter Outcome:  No Answer   Care Coordination Interventions:  No, not indicated    Valente David, RN, MSN, [facility] Care Management Care Management Coordinator [phone]

## 2022-08-17 ENCOUNTER — Other Ambulatory Visit: Payer: Self-pay | Admitting: Family Medicine

## 2022-08-17 DIAGNOSIS — E119 Type 2 diabetes mellitus without complications: Secondary | ICD-10-CM

## 2022-08-18 ENCOUNTER — Telehealth: Payer: Self-pay | Admitting: *Deleted

## 2022-08-18 DIAGNOSIS — I5022 Chronic systolic (congestive) heart failure: Secondary | ICD-10-CM | POA: Diagnosis not present

## 2022-08-18 DIAGNOSIS — I214 Non-ST elevation (NSTEMI) myocardial infarction: Secondary | ICD-10-CM | POA: Diagnosis not present

## 2022-08-18 DIAGNOSIS — I255 Ischemic cardiomyopathy: Secondary | ICD-10-CM | POA: Diagnosis not present

## 2022-08-18 DIAGNOSIS — N179 Acute kidney failure, unspecified: Secondary | ICD-10-CM | POA: Diagnosis not present

## 2022-08-18 DIAGNOSIS — E119 Type 2 diabetes mellitus without complications: Secondary | ICD-10-CM | POA: Diagnosis not present

## 2022-08-18 DIAGNOSIS — E782 Mixed hyperlipidemia: Secondary | ICD-10-CM | POA: Diagnosis not present

## 2022-08-18 DIAGNOSIS — I10 Essential (primary) hypertension: Secondary | ICD-10-CM | POA: Diagnosis not present

## 2022-08-18 DIAGNOSIS — I2510 Atherosclerotic heart disease of native coronary artery without angina pectoris: Secondary | ICD-10-CM | POA: Diagnosis not present

## 2022-08-18 DIAGNOSIS — N189 Chronic kidney disease, unspecified: Secondary | ICD-10-CM | POA: Diagnosis not present

## 2022-08-18 NOTE — Progress Notes (Signed)
  Care Coordination Note  08/18/2022 Name: Jordan Thornton MRN: [id number] DOB: 1930/04/28  Jordan McCoy Qualley is a 88 y.o. year old female who is a primary care patient of Juline Patch, MD and is actively engaged with the care management team. I reached out to Jordan McCoy Pau by phone today to assist with re-scheduling an initial visit with the RN Case Manager  Follow up plan: Unsuccessful telephone outreach attempt made. A HIPAA compliant phone message was left for the patient providing contact information and requesting a return call.   Julian Hy, [place] Direct Dial: [phone]

## 2022-08-19 ENCOUNTER — Ambulatory Visit: Payer: PPO | Admitting: Family

## 2022-08-20 ENCOUNTER — Encounter: Payer: Self-pay | Admitting: Family Medicine

## 2022-08-20 ENCOUNTER — Ambulatory Visit (INDEPENDENT_AMBULATORY_CARE_PROVIDER_SITE_OTHER): Payer: PPO | Admitting: Family Medicine

## 2022-08-20 ENCOUNTER — Ambulatory Visit: Payer: PPO | Admitting: Family Medicine

## 2022-08-20 VITALS — BP 112/68 | HR 70 | Ht 64.0 in | Wt 117.0 lb

## 2022-08-20 DIAGNOSIS — M47817 Spondylosis without myelopathy or radiculopathy, lumbosacral region: Secondary | ICD-10-CM | POA: Diagnosis not present

## 2022-08-20 MED ORDER — GABAPENTIN 100 MG PO CAPS: 100.0000 mg | ORAL_CAPSULE | Freq: Every day | ORAL | 0 refills | Status: DC

## 2022-08-20 NOTE — Progress Notes (Signed)
Patient ID: Jordan Thornton, female    DOB: 03-14-1930, 88 y.o.   MRN: [id number]  HPI  Jordan Thornton is a 88 y/o female with a history of HTN, DM, hyperlipidemia, GERD, previous tobacco use and chronic heart failure.   Echo on 04/05/22 showed EF 30-35%. Echo 02/01/22 left ventricular EF 30-35%  Was in the ED 08/03/22 due to sciatica on the left side. Admitted 07/27/22 due to complaints of orthopnea. BNP greater than 4500. Cardiology consult obtained. Received IV lasix couple doses.Creatinine slight bump. Discharged after 3 days. Was in the ED on 04/03/22 for dyspnea, elevated troponin. NSTEMI and acute on chronic systolic heart failure.  She presents today for a follow-up visit with a chief complaint of left sciatica pain. She describes this as improving as she has had some medication changes. She has no other symptoms and specifically denies any difficulty sleeping, dizziness, abdominal distention, palpitations, pedal edema, chest pain, shortness of breath, cough, fatigue or weight gain.   She says that she had not been monitoring her sodium intake prior to her admission last month but now realizes that she needs to closely watch this.   Past Medical History:  Diagnosis Date   CHF (congestive heart failure) (HCC)    Diabetes mellitus without complication (HCC)    GERD (gastroesophageal reflux disease)    Hyperlipidemia    Hypertension    Past Surgical History:  Procedure Laterality Date   bladder tack     BREAST BIOPSY Left 10-15 yrs ago   benign   CATARACT EXTRACTION Bilateral 05/2015   CHOLECYSTECTOMY OPEN     VAGINAL HYSTERECTOMY     Family History  Problem Relation Age of Onset   Heart disease Maternal Aunt    Heart disease Maternal Grandmother    Stroke Father    Breast cancer Neg Hx    Social History   Tobacco Use   Smoking status: Former    Packs/day: 0.25    Years: 25.00    Total pack years: 6.25    Types: Cigarettes    Quit date: 1982    Years since  quitting: 41.9   Smokeless tobacco: Never   Tobacco comments:    smoking cessation materials not required  Substance Use Topics   Alcohol use: No    Alcohol/week: 0.0 standard drinks of alcohol   Allergies  Allergen Reactions   Sertraline Other (See Comments)    Made her more depressed   Prior to Admission medications   Medication Sig Start Date End Date Taking? Authorizing Provider  Acetaminophen (TYLENOL ARTHRITIS PAIN PO) Take 2 tablets by mouth daily as needed.    Yes [provider]  ADVOCATE LANCETS MISC use once daily as directed to test blood sugar 04/24/15  Yes Juline Patch, MD  aspirin EC 81 MG tablet Take 1 tablet (81 mg total) by mouth daily. Swallow whole. 02/02/22  Yes Lorella Nimrod, MD  atorvastatin (LIPITOR) 40 MG tablet Take 1 tablet (40 mg total) by mouth every evening. 04/06/22  Yes Wieting, Richard, MD  Blood Glucose Monitoring Suppl (FREESTYLE LITE) DEVI use once daily as directed to test blood sugar 04/24/15  Yes Juline Patch, MD  clopidogrel (PLAVIX) 75 MG tablet Take 1 tablet (75 mg total) by mouth daily. 04/07/22  Yes Loletha Grayer, MD  Elderberry 575 MG/5ML SYRP Take 1 Dose by mouth daily.    Yes [provider]  feeding supplement (ENSURE ENLIVE / ENSURE PLUS) LIQD Take 237 mLs by mouth 2 (  two) times daily between meals. 07/30/22  Yes Fritzi Mandes, MD  furosemide (LASIX) 20 MG tablet Take 0.5 tablets (10 mg total) by mouth daily. 08/01/22  Yes Fritzi Mandes, MD  gabapentin (NEURONTIN) 100 MG capsule Take 1 capsule (100 mg total) by mouth at bedtime. 08/20/22  Yes Montel Culver, MD  glucose blood (FREESTYLE LITE) test strip Use as instructed 08/05/22  Yes Juline Patch, MD  hydrALAZINE (APRESOLINE) 25 MG tablet Take 12.5 mg by mouth 2 (two) times daily.   Yes [provider]  isosorbide mononitrate (IMDUR) 30 MG 24 hr tablet Take 30 mg by mouth daily. 07/21/22 07/21/23 Yes [provider]  Lancet Devices (ADVOCATE LANCING  DEVICE) MISC use once daily as directed to test blood sugar 04/24/15  Yes Juline Patch, MD  lansoprazole (PREVACID) 15 MG capsule Take 1 capsule by mouth once daily 06/23/21  Yes Juline Patch, MD  metoprolol succinate (TOPROL-XL) 25 MG 24 hr tablet Take 0.5 tablets (12.5 mg total) by mouth at bedtime. 04/06/22  Yes Wieting, Richard, MD  Multiple Vitamin (MULTIVITAMIN WITH MINERALS) TABS tablet Take 1 tablet by mouth daily. 07/30/22  Yes Fritzi Mandes, MD  Omega-3 Fatty Acids (FISH OIL) 1000 MG CAPS Take 1 capsule (1,000 mg total) by mouth daily. 11/17/19  Yes Juline Patch, MD  predniSONE (DELTASONE) 10 MG tablet Take 1 tablet (10 mg total) by mouth daily with breakfast. 08/05/22  Yes Juline Patch, MD  sitaGLIPtin (JANUVIA) 25 MG tablet Take 1 tablet (25 mg total) by mouth daily. 08/18/22  Yes Juline Patch, MD  LORazepam (ATIVAN) 0.5 MG tablet Take 1 tablet (0.5 mg total) by mouth as needed. Per 30 days Patient not taking: Reported on 08/21/2022 01/27/22   Juline Patch, MD   Review of Systems  Constitutional:  Negative for appetite change and fatigue.  HENT:  Negative for congestion, postnasal drip and sore throat.   Eyes: Negative.   Respiratory:  Negative for apnea, cough and shortness of breath.   Cardiovascular:  Negative for chest pain, palpitations and leg swelling.  Gastrointestinal:  Negative for abdominal distention and abdominal pain.  Endocrine: Negative.   Genitourinary:  Negative for difficulty urinating and frequency.  Musculoskeletal:  Positive for arthralgias (left sciatic pain).  Skin: Negative.   Allergic/Immunologic: Negative.   Neurological:  Negative for dizziness, weakness, light-headedness and headaches.  Hematological:  Negative for adenopathy. Does not bruise/bleed easily.  Psychiatric/Behavioral:  Negative for dysphoric mood and sleep disturbance. The patient is not nervous/anxious.    Vitals:   08/21/22 0908  BP: 124/71  Pulse: 74  Resp: 18  SpO2:  100%  Weight: 118 lb (53.5 kg)   Wt Readings from Last 3 Encounters:  08/21/22 118 lb (53.5 kg)  08/20/22 117 lb (53.1 kg)  08/05/22 117 lb (53.1 kg)   Lab Results  Component Value Date   CREATININE 2.70 (H) 07/30/2022   CREATININE 2.43 (H) 07/29/2022   CREATININE 2.30 (H) 07/28/2022   Physical Exam Constitutional:      Appearance: Normal appearance. She is normal weight.  HENT:     Head: Normocephalic and atraumatic.  Cardiovascular:     Rate and Rhythm: Normal rate and regular rhythm.     Pulses: Normal pulses.     Heart sounds: Normal heart sounds.  Pulmonary:     Effort: Pulmonary effort is normal.     Breath sounds: Normal breath sounds.  Abdominal:     General: There is no  distension.     Palpations: Abdomen is soft.     Tenderness: There is no abdominal tenderness.  Musculoskeletal:        General: Normal range of motion.     Cervical back: Neck supple.     Right lower leg: No edema.     Left lower leg: No edema.  Skin:    General: Skin is warm and dry.  Neurological:     General: No focal deficit present.     Mental Status: She is alert and oriented to person, place, and time.  Psychiatric:        Mood and Affect: Mood normal.        Behavior: Behavior normal.        Thought Content: Thought content normal.        Judgment: Judgment normal.    Assessment and Plan:  1) Chronic Heart failure with reduced ejection fraction- - NYHA Class I - euvolemic - weighing daily, reminded to call for overnight weight gain of 3 lbs or greater and weekly gain of 5 lbs or greater  - weight down 4 pounds from last visit here 4 months ago - on GDMT of metoprolol succinate  - renal function limits other GDMT - reviewed how to read food labels and examples were printed out and went over with her; instructed to keep daily sodium intake to 1500-2023m - saw cardiology (Clayborn Bigness 08/18/22  - BNP was 2133.6 on 04/03/22 - has received her flu, pneumonia & RSV vaccines  2)  HTN- - BP looks good (124/71) - PCP (Ronnald Ramp 08/05/22 - BMP 08/12/22 reviewed and showed Sodium 140, Potassium 3.9, Creatinine 2.38, and eGFR 19  3) DM- - saw nephrology (Holley Raring on 08/12/22   - A1c 07/02/22 was 6.7% - home glucose today was 105   Medication list reviewed.   Return in 6 weeks, sooner if needed. If still doing well, will make future appointments PRN

## 2022-08-20 NOTE — Patient Instructions (Signed)
-   Take prednisone twice daily x 7 days then resume daily dosing - Apply topical Voltaren gel 4 times daily x 7 days then decrease to twice daily - Start nightly gabapentin - Perform gentle activity as tolerated - Contact us for any lingering symptoms after the above has been completed

## 2022-08-21 ENCOUNTER — Ambulatory Visit: Payer: PPO | Attending: Family | Admitting: Family

## 2022-08-21 ENCOUNTER — Encounter: Payer: Self-pay | Admitting: Family

## 2022-08-21 VITALS — BP 124/71 | HR 74 | Resp 18 | Wt 118.0 lb

## 2022-08-21 DIAGNOSIS — I10 Essential (primary) hypertension: Secondary | ICD-10-CM

## 2022-08-21 DIAGNOSIS — Z79899 Other long term (current) drug therapy: Secondary | ICD-10-CM | POA: Insufficient documentation

## 2022-08-21 DIAGNOSIS — I110 Hypertensive heart disease with heart failure: Secondary | ICD-10-CM | POA: Insufficient documentation

## 2022-08-21 DIAGNOSIS — I5022 Chronic systolic (congestive) heart failure: Secondary | ICD-10-CM | POA: Diagnosis not present

## 2022-08-21 DIAGNOSIS — E1169 Type 2 diabetes mellitus with other specified complication: Secondary | ICD-10-CM | POA: Diagnosis not present

## 2022-08-21 DIAGNOSIS — M5432 Sciatica, left side: Secondary | ICD-10-CM | POA: Insufficient documentation

## 2022-08-21 DIAGNOSIS — E785 Hyperlipidemia, unspecified: Secondary | ICD-10-CM

## 2022-08-21 DIAGNOSIS — E119 Type 2 diabetes mellitus without complications: Secondary | ICD-10-CM | POA: Insufficient documentation

## 2022-08-21 DIAGNOSIS — I252 Old myocardial infarction: Secondary | ICD-10-CM | POA: Diagnosis not present

## 2022-08-21 DIAGNOSIS — Z87891 Personal history of nicotine dependence: Secondary | ICD-10-CM | POA: Insufficient documentation

## 2022-08-21 NOTE — Progress Notes (Signed)
  Care Coordination Note  08/21/2022 Name: Jordan Thornton MRN: [id number] DOB: 1929/12/20  Jordan Thornton is a 88 y.o. year old female who is a primary care patient of Juline Patch, MD and is actively engaged with the care management team. I reached out to Jordan Thornton by phone today to assist with re-scheduling an initial visit with the RN Case Manager  Follow up plan: We have been unable to make contact with the patient for follow up.   Julian Hy, [place] Direct Dial: [phone]

## 2022-08-21 NOTE — Patient Instructions (Addendum)
Continue weighing daily and call for an overnight weight gain of 3 pounds or more or a weekly weight gain of more than 5 pounds  If you have voicemail, please make sure your mailbox is cleaned out so that we may leave a message and please make sure to listen to any voicemails.    If you receive a satisfaction survey regarding the [facility], please take the time to fill it out. This way we can continue to provide excellent care and make any changes that need to be made.     

## 2022-08-22 DIAGNOSIS — M47817 Spondylosis without myelopathy or radiculopathy, lumbosacral region: Secondary | ICD-10-CM | POA: Insufficient documentation

## 2022-08-22 NOTE — Progress Notes (Signed)
     Primary Care / Sports Medicine Office Visit  Patient Information:  Patient ID: Jordan McCoy Lorge, female DOB: 1930-07-19 Age: 88 y.o. MRN: [id number]   Jordan Thornton is a pleasant 88 y.o. female presenting with the following:  Chief Complaint  Patient presents with   Sciatica    Left side, 2 weeks, was in hospital, stayed in bed for 4 days while in hospital and it has been bothering her since, leg feels numb in the front    Vitals:   08/20/22 1023  BP: 112/68  Pulse: 70  SpO2: 95%   Vitals:   08/20/22 1023  Weight: 117 lb (53.1 kg)  Height: 5\' 4"  (1.626 m)   Body mass index is 20.08 kg/m.     Independent interpretation of notes and tests performed by another provider:   Independent interpretation of lumbar spine x-rays demonstrates multilevel degenerative changes focal to the thoracolumbar junction and lower lumbar regions, compression deformity in former, no acute osseous processes noted.  Procedures performed:   None  Pertinent History, Exam, Impression, and Recommendations:   Problem List Items Addressed This Visit       Musculoskeletal and Integument   Spondylosis of lumbosacral region without myelopathy or radiculopathy - Primary    Patient with 2 week history of atraumatic, acute exacerbation of chronic left sided sciatica type symptoms - noted recurrence after stay in hospital being bed-bound. Examination with positive left SLR, tenderness about the left SI joint. Treatments outlined and she is to double prednisone course advised by her PCP Dr. Ronnald Ramp x 7 days then resume regimen, adjunct low dose gabapentin nightly, and topical Voltaren regimen. She is to contact us for any persistent symptoms and otherwise follow-up as-needed.      Relevant Medications   gabapentin (NEURONTIN) 100 MG capsule     Orders & Medications Meds ordered this encounter  Medications   gabapentin (NEURONTIN) 100 MG capsule    Sig: Take 1 capsule (100 mg total) by  mouth at bedtime.    Dispense:  30 capsule    Refill:  0   No orders of the defined types were placed in this encounter.    Return if symptoms worsen or fail to improve.     Montel Culver, MD, [facility]   Primary Care Sports Medicine Primary Care and Sports Medicine at [facility]

## 2022-08-22 NOTE — Assessment & Plan Note (Signed)
Patient with 2 week history of atraumatic, acute exacerbation of chronic left sided sciatica type symptoms - noted recurrence after stay in hospital being bed-bound. Examination with positive left SLR, tenderness about the left SI joint. Treatments outlined and she is to double prednisone course advised by her PCP Dr. Ronnald Ramp x 7 days then resume regimen, adjunct low dose gabapentin nightly, and topical Voltaren regimen. She is to contact us for any persistent symptoms and otherwise follow-up as-needed.

## 2022-09-01 ENCOUNTER — Telehealth: Payer: Self-pay

## 2022-09-01 DIAGNOSIS — Z596 Low income: Secondary | ICD-10-CM

## 2022-09-01 NOTE — Progress Notes (Signed)
[facility])                                            [place] Team    09/01/2022  [place] McCoy Garlock 1930-04-27 [id number]   [facility] [place])                                            [place] Team   Received patient and provider portion(s) of patient assistance application(s) for Januvia. Mailed completed application and required documents into [place].   Lelon Huh, [facility] [phone]

## 2022-09-21 ENCOUNTER — Telehealth: Payer: Self-pay | Admitting: Family

## 2022-09-21 NOTE — Progress Notes (Unsigned)
Patient ID: Jordan Thornton, female    DOB: 08-24-1930, 89 y.o.   MRN: [id number]  HPI  Ms Ament is a 89 y/o female with a history of HTN, DM, hyperlipidemia, GERD, previous tobacco use and chronic heart failure.   Echo on 04/05/22 showed EF 30-35%. Echo 02/01/22 left ventricular EF 30-35%  Was in the ED 08/03/22 due to sciatica on the left side. Admitted 07/27/22 due to complaints of orthopnea. BNP greater than 4500. Cardiology consult obtained. Received IV lasix couple doses.Creatinine slight bump. Discharged after 3 days. Was in the ED on 04/03/22 for dyspnea, elevated troponin. NSTEMI and acute on chronic systolic heart failure.  She presents today for an acute visit with a chief complaint of moderate SOB with little exertion. Says that this has been worsening over the last few weeks. She has associated intermittent chest pain that has been occurring at times along with chronic difficulty sleeping. Denies any dizziness, abdominal distention, palpitations, pedal edema, cough or weight gain.   Was started on gabapentin and she stopped this ~ 5 days ago because she thought this was making her SOB. She says that since that time she "might feel a little bit better".   Has upcoming nephrology appt later this week. Saw cardiology last month.    Past Medical History:  Diagnosis Date   CHF (congestive heart failure) (HCC)    Diabetes mellitus without complication (HCC)    GERD (gastroesophageal reflux disease)    Hyperlipidemia    Hypertension    Past Surgical History:  Procedure Laterality Date   bladder tack     BREAST BIOPSY Left 10-15 yrs ago   benign   CATARACT EXTRACTION Bilateral 05/2015   CHOLECYSTECTOMY OPEN     VAGINAL HYSTERECTOMY     Family History  Problem Relation Age of Onset   Heart disease Maternal Aunt    Heart disease Maternal Grandmother    Stroke Father    Breast cancer Neg Hx    Social History   Tobacco Use   Smoking status: Former    Packs/day:  0.25    Years: 25.00    Total pack years: 6.25    Types: Cigarettes    Quit date: 1982    Years since quitting: 42.0   Smokeless tobacco: Never   Tobacco comments:    smoking cessation materials not required  Substance Use Topics   Alcohol use: No    Alcohol/week: 0.0 standard drinks of alcohol   Allergies  Allergen Reactions   Sertraline Other (See Comments)    Made her more depressed   Prior to Admission medications   Medication Sig Start Date End Date Taking? Authorizing Provider  Acetaminophen (TYLENOL ARTHRITIS PAIN PO) Take 2 tablets by mouth daily as needed.    Yes [provider]  aspirin EC 81 MG tablet Take 1 tablet (81 mg total) by mouth daily. Swallow whole. 02/02/22  Yes Lorella Nimrod, MD  atorvastatin (LIPITOR) 40 MG tablet Take 1 tablet (40 mg total) by mouth every evening. 04/06/22  Yes Wieting, Richard, MD  clopidogrel (PLAVIX) 75 MG tablet Take 1 tablet (75 mg total) by mouth daily. 04/07/22  Yes Loletha Grayer, MD  Elderberry 575 MG/5ML SYRP Take 1 Dose by mouth daily.    Yes [provider]  feeding supplement (ENSURE ENLIVE / ENSURE PLUS) LIQD Take 237 mLs by mouth 2 (two) times daily between meals. 07/30/22  Yes Fritzi Mandes, MD  furosemide (LASIX) 20 MG tablet Take 0.5 tablets (10  mg total) by mouth daily. 08/01/22  Yes Fritzi Mandes, MD  hydrALAZINE (APRESOLINE) 25 MG tablet Take 12.5 mg by mouth 2 (two) times daily.   Yes [provider]  isosorbide mononitrate (IMDUR) 30 MG 24 hr tablet Take 30 mg by mouth daily. 07/21/22 07/21/23 Yes [provider]  lansoprazole (PREVACID) 15 MG capsule Take 1 capsule by mouth once daily 06/23/21  Yes Juline Patch, MD  metoprolol succinate (TOPROL-XL) 25 MG 24 hr tablet Take 0.5 tablets (12.5 mg total) by mouth at bedtime. 04/06/22  Yes Wieting, Richard, MD  Multiple Vitamin (MULTIVITAMIN WITH MINERALS) TABS tablet Take 1 tablet by mouth daily. 07/30/22  Yes Fritzi Mandes, MD  sitaGLIPtin  (JANUVIA) 25 MG tablet Take 1 tablet (25 mg total) by mouth daily. 08/18/22  Yes Juline Patch, MD  ADVOCATE LANCETS MISC use once daily as directed to test blood sugar Patient not taking: Reported on 09/22/2022 04/24/15   Juline Patch, MD  Blood Glucose Monitoring Suppl (FREESTYLE LITE) DEVI use once daily as directed to test blood sugar Patient not taking: Reported on 09/22/2022 04/24/15   Juline Patch, MD  gabapentin (NEURONTIN) 100 MG capsule Take 1 capsule (100 mg total) by mouth at bedtime. Patient not taking: Reported on 09/22/2022 08/20/22   Montel Culver, MD  glucose blood (FREESTYLE LITE) test strip Use as instructed Patient not taking: Reported on 09/22/2022 08/05/22   Juline Patch, MD  Lancet Devices (ADVOCATE LANCING DEVICE) MISC use once daily as directed to test blood sugar Patient not taking: Reported on 09/22/2022 04/24/15   Juline Patch, MD  LORazepam (ATIVAN) 0.5 MG tablet Take 1 tablet (0.5 mg total) by mouth as needed. Per 30 days Patient not taking: Reported on 08/21/2022 01/27/22   Juline Patch, MD  Omega-3 Fatty Acids (FISH OIL) 1000 MG CAPS Take 1 capsule (1,000 mg total) by mouth daily. Patient not taking: Reported on 09/22/2022 11/17/19   Juline Patch, MD    Review of Systems  Constitutional:  Negative for appetite change and fatigue.  HENT:  Negative for congestion, postnasal drip and sore throat.   Eyes: Negative.   Respiratory:  Positive for shortness of breath (easily). Negative for apnea and cough.   Cardiovascular:  Positive for chest pain (under left breast). Negative for palpitations and leg swelling.  Gastrointestinal:  Negative for abdominal distention and abdominal pain.  Endocrine: Negative.   Genitourinary:  Negative for difficulty urinating and frequency.  Musculoskeletal:  Positive for arthralgias (left sciatic pain).  Skin: Negative.   Allergic/Immunologic: Negative.   Neurological:  Negative for dizziness, weakness, light-headedness and  headaches.  Hematological:  Negative for adenopathy. Does not bruise/bleed easily.  Psychiatric/Behavioral:  Positive for sleep disturbance. Negative for dysphoric mood. The patient is not nervous/anxious.    Vitals:   09/22/22 1223  BP: 112/70  Pulse: 68  Resp: 16  SpO2: 92%  Weight: 118 lb (53.5 kg)   Wt Readings from Last 3 Encounters:  09/22/22 118 lb (53.5 kg)  08/21/22 118 lb (53.5 kg)  08/20/22 117 lb (53.1 kg)   Lab Results  Component Value Date   CREATININE 2.70 (H) 07/30/2022   CREATININE 2.43 (H) 07/29/2022   CREATININE 2.30 (H) 07/28/2022   Physical Exam Vitals and nursing note reviewed. Exam conducted with a chaperone present (son).  Constitutional:      Appearance: Normal appearance. She is normal weight.  HENT:     Head: Normocephalic and atraumatic.  Cardiovascular:  Rate and Rhythm: Normal rate and regular rhythm.     Pulses: Normal pulses.     Heart sounds: Normal heart sounds.  Pulmonary:     Effort: Pulmonary effort is normal.     Breath sounds: Rales (LLL) present.  Abdominal:     General: There is no distension.     Palpations: Abdomen is soft.     Tenderness: There is no abdominal tenderness.  Musculoskeletal:        General: Normal range of motion.     Cervical back: Neck supple.     Right lower leg: No edema.     Left lower leg: No edema.  Skin:    General: Skin is warm and dry.  Neurological:     General: No focal deficit present.     Mental Status: She is alert and oriented to person, place, and time.  Psychiatric:        Mood and Affect: Mood normal.        Behavior: Behavior normal.        Thought Content: Thought content normal.        Judgment: Judgment normal.    Assessment and Plan:  1) Acute on Chronic Heart failure with reduced ejection fraction- - NYHA Class III - rales noted today in LLL; advised to take an additional 10mg  furosemide today  - ReDs clip reading in office was 33% - weighing daily, reminded to call  for overnight weight gain of 3 lbs or greater and weekly gain of 5 lbs or greater  - weight stable from last visit here 1 month ago - on GDMT of metoprolol succinate  - renal function limits other GDMT - not adding salt to her food - saw cardiology Clayborn Bigness) 08/18/22  - BNP was 2133.6 on 04/03/22 - have received her flu, pneumonia & RSV vaccines a couple of months ago - PharmD reconciled medications w/ patient  2) HTN- - BP 112/70 - PCP Ronnald Ramp) 08/05/22 - BMP 08/12/22 reviewed and showed Sodium 140, Potassium 3.9, Creatinine 2.38, and eGFR 19  3) DM- - saw nephrology Holley Raring) on 08/12/22; returns in 2 days - A1c 07/02/22 was 6.7%  4: Chest pain- - EKG done in office and similar to last EKG - BMP, troponin and Ck-MB drawn today - will fax EKG and labs to Dr Clayborn Bigness - should symptoms worsen, she was instructed to go to ED   Medication list reviewed.   Return in next week, sooner if needed.

## 2022-09-21 NOTE — Telephone Encounter (Signed)
Patient called to say that she's been experiencing more SOB over the last few weeks. She was concerned that it may be related to her gabapentin that was started so she stopped taking this medication ~ 5 days ago. SOB has continued. Denies any weight gain or swelling and has been watching her diet closely. Have scheduled appt with patient for tomorrow.

## 2022-09-22 ENCOUNTER — Other Ambulatory Visit
Admission: RE | Admit: 2022-09-22 | Discharge: 2022-09-22 | Disposition: A | Discharge status: Home / Self Care | Payer: PPO | Source: Ambulatory Visit | Attending: Family | Admitting: Family

## 2022-09-22 ENCOUNTER — Encounter: Payer: Self-pay | Admitting: Family

## 2022-09-22 ENCOUNTER — Encounter: Payer: Self-pay | Admitting: Pharmacist

## 2022-09-22 ENCOUNTER — Ambulatory Visit (HOSPITAL_BASED_OUTPATIENT_CLINIC_OR_DEPARTMENT_OTHER): Payer: PPO | Admitting: Family

## 2022-09-22 ENCOUNTER — Telehealth: Payer: Self-pay | Admitting: Family

## 2022-09-22 VITALS — BP 112/70 | HR 68 | Resp 16 | Wt 118.0 lb

## 2022-09-22 DIAGNOSIS — I10 Essential (primary) hypertension: Secondary | ICD-10-CM

## 2022-09-22 DIAGNOSIS — E1169 Type 2 diabetes mellitus with other specified complication: Secondary | ICD-10-CM

## 2022-09-22 DIAGNOSIS — I5023 Acute on chronic systolic (congestive) heart failure: Secondary | ICD-10-CM | POA: Diagnosis not present

## 2022-09-22 DIAGNOSIS — E785 Hyperlipidemia, unspecified: Secondary | ICD-10-CM | POA: Diagnosis not present

## 2022-09-22 DIAGNOSIS — R079 Chest pain, unspecified: Secondary | ICD-10-CM | POA: Diagnosis not present

## 2022-09-22 LAB — CK TOTAL AND CKMB (NOT AT ARMC)
CK, MB: 2.1 ng/mL (ref 0.5–5.0)
Relative Index: INVALID (ref 0.0–2.5)
Total CK: 60 U/L (ref 38–234)

## 2022-09-22 LAB — BRAIN NATRIURETIC PEPTIDE: B Natriuretic Peptide: 3283.6 pg/mL — ABNORMAL HIGH (ref 0.0–100.0)

## 2022-09-22 LAB — TROPONIN I (HIGH SENSITIVITY): Troponin I (High Sensitivity): 374 ng/L (ref <18)

## 2022-09-22 NOTE — Patient Instructions (Addendum)
Continue weighing daily and call for an overnight weight gain of 3 pounds or more or a weekly weight gain of more than 5 pounds.   If you have voicemail, please make sure your mailbox is cleaned out so that we may leave a message and please make sure to listen to any voicemails.    I will send the EKG and lab results over to Dr. Etta Quill office.

## 2022-09-22 NOTE — Telephone Encounter (Signed)
Received critical lab alert of troponin of 374. Peak troponin back in Nov 2023 admission was 898. BNP today is 3283.6 (was >4500). At [facility] earlier today she was advised to take additional 10mg  lasix today. ReDs clip reading in office was normal at 33%.   Messaged patient's cardiologist (Dr Clayborn Bigness) about lab results and her OV in the HF Clinic earlier today about whether she should go to the ER with elevated troponin (even though it's markedly lower than 2 months ago). He said that she didn't need to go to the ER and that he would see her in his office tomorrow (Wed 09/23/22).   Called patient and advised her of labs and that they were elevated but better than November and that Dr. Clayborn Bigness would see patient in his office tomorrow. She will call his office to get this scheduled.   Reminded patient that should symptoms worsen tonight, that she should call 911 and she verbalized understanding.

## 2022-09-23 DIAGNOSIS — N189 Chronic kidney disease, unspecified: Secondary | ICD-10-CM | POA: Diagnosis not present

## 2022-09-23 DIAGNOSIS — E119 Type 2 diabetes mellitus without complications: Secondary | ICD-10-CM | POA: Diagnosis not present

## 2022-09-23 DIAGNOSIS — E782 Mixed hyperlipidemia: Secondary | ICD-10-CM | POA: Diagnosis not present

## 2022-09-23 DIAGNOSIS — I5022 Chronic systolic (congestive) heart failure: Secondary | ICD-10-CM | POA: Diagnosis not present

## 2022-09-23 DIAGNOSIS — I255 Ischemic cardiomyopathy: Secondary | ICD-10-CM | POA: Diagnosis not present

## 2022-09-23 DIAGNOSIS — N179 Acute kidney failure, unspecified: Secondary | ICD-10-CM | POA: Diagnosis not present

## 2022-09-23 DIAGNOSIS — I214 Non-ST elevation (NSTEMI) myocardial infarction: Secondary | ICD-10-CM | POA: Diagnosis not present

## 2022-09-23 DIAGNOSIS — I2510 Atherosclerotic heart disease of native coronary artery without angina pectoris: Secondary | ICD-10-CM | POA: Diagnosis not present

## 2022-09-23 NOTE — Progress Notes (Signed)
Patient ID: Jordan Thornton, female   DOB: 02-20-1930, 89 y.o.   MRN: [id number]  [facility] - PHARMACIST COUNSELING NOTE  *HFrEF*  Guideline-Directed Medical Therapy/Evidence Based Medicine  ACE/ARB/ARNI:  none - CKD  IV - not stable Beta Blocker: Metoprolol succinate 12.5 mg daily Aldosterone Antagonist:  none - CKD IV - not stable Diuretic: Furosemide 10 mg daily SGLT2i:  none  Adherence Assessment  Do you ever forget to take your medication? [] Yes [x] No  Do you ever skip doses due to side effects? [] Yes [x] No  Do you have trouble affording your medicines? [] Yes [x] No  Are you ever unable to pick up your medication due to transportation difficulties? [] Yes [x] No  Do you ever stop taking your medications because you don't believe they are helping? [] Yes [x] No  Do you check your weight daily? [x] Yes [] No   Adherence strategy: pill box  Barriers to obtaining medications: none  Vital signs: HR 68, BP 112/70, weight (pounds) 118 lbs (stable)  ECHO: 04/05/22 showed EF 30-35%. Echo 02/01/22 left ventricular EF 30-35%   ReDS 33% today 09/22/2022     Latest Ref Rng & Units 07/30/2022    6:04 AM 07/29/2022    4:53 AM 07/28/2022    1:53 AM  BMP  Glucose 70 - 99 mg/dL 102  105  102   BUN 8 - 23 mg/dL 61  57  51   Creatinine 0.44 - 1.00 mg/dL 2.70  2.43  2.30   Sodium 135 - 145 mmol/L 139  141  142   Potassium 3.5 - 5.1 mmol/L 4.1  3.7  3.7   Chloride 98 - 111 mmol/L 103  104  109   CO2 22 - 32 mmol/L 27  25  23    Calcium 8.9 - 10.3 mg/dL 9.2  9.4  9.0     Past Medical History:  Diagnosis Date   CHF (congestive heart failure) (HCC)    Diabetes mellitus without complication (HCC)    GERD (gastroesophageal reflux disease)    Hyperlipidemia    Hypertension     ASSESSMENT 89 year old female who presents to the HF clinic for follow up. PMH includes f HTN, DM, hyperlipidemia, GERD, previous tobacco use, CKD-IV and chronic  heart failure. Last ED visit was 11/20 for Sciatica of left side.  Patient reports compliance with therapy, but stated is feeling worst that she felt when admitted to ED in November. MedRec completed during this visit. Patient done with prednisone and holding gabapentin due to possibly contributing to fluid retention. She is following renal function with nephrology and next appointment is scheduled for this Thursday.   PLAN  Fluid management per NP Repeat BMET Consider adding SGLT21 once renal function stable to help with fluid balance.   Time spent: 15 minutes  Zohar Laing Rodriguez-Guzman PharmD, BCPS 09/23/2022 2:08 PM    Current Outpatient Medications:    Acetaminophen (TYLENOL ARTHRITIS PAIN PO), Take 2 tablets by mouth daily as needed. , Disp: , Rfl:    ADVOCATE LANCETS MISC, use once daily as directed to test blood sugar (Patient not taking: Reported on 09/22/2022), Disp: 100 each, Rfl: 0   aspirin EC 81 MG tablet, Take 1 tablet (81 mg total) by mouth daily. Swallow whole., Disp: 30 tablet, Rfl: 12   atorvastatin (LIPITOR) 40 MG tablet, Take 1 tablet (40 mg total) by mouth every evening., Disp: 30 tablet, Rfl: 0   Blood Glucose Monitoring Suppl (FREESTYLE LITE) DEVI, use once daily  as directed to test blood sugar (Patient not taking: Reported on 09/22/2022), Disp: 1 each, Rfl: 0   clopidogrel (PLAVIX) 75 MG tablet, Take 1 tablet (75 mg total) by mouth daily., Disp: 30 tablet, Rfl: 0   Elderberry 575 MG/5ML SYRP, Take 1 Dose by mouth daily. , Disp: , Rfl:    feeding supplement (ENSURE ENLIVE / ENSURE PLUS) LIQD, Take 237 mLs by mouth 2 (two) times daily between meals., Disp: 237 mL, Rfl: 12   furosemide (LASIX) 20 MG tablet, Take 0.5 tablets (10 mg total) by mouth daily., Disp: 30 tablet, Rfl: 0   gabapentin (NEURONTIN) 100 MG capsule, Take 1 capsule (100 mg total) by mouth at bedtime. (Patient not taking: Reported on 09/22/2022), Disp: 30 capsule, Rfl: 0   hydrALAZINE (APRESOLINE) 25 MG  tablet, Take 12.5 mg by mouth 2 (two) times daily., Disp: , Rfl:    isosorbide mononitrate (IMDUR) 30 MG 24 hr tablet, Take 30 mg by mouth daily., Disp: , Rfl:    Lancet Devices (ADVOCATE LANCING DEVICE) MISC, use once daily as directed to test blood sugar (Patient not taking: Reported on 09/22/2022), Disp: 1 each, Rfl: 0   lansoprazole (PREVACID) 15 MG capsule, Take 1 capsule by mouth once daily, Disp: 90 capsule, Rfl: 0   metoprolol succinate (TOPROL-XL) 25 MG 24 hr tablet, Take 0.5 tablets (12.5 mg total) by mouth at bedtime., Disp: 15 tablet, Rfl: 0   Multiple Vitamin (MULTIVITAMIN WITH MINERALS) TABS tablet, Take 1 tablet by mouth daily., Disp: 30 tablet, Rfl: 0   sitaGLIPtin (JANUVIA) 25 MG tablet, Take 1 tablet (25 mg total) by mouth daily., Disp: 90 tablet, Rfl: 0    MEDICATION ADHERENCES TIPS AND STRATEGIES Taking medication as prescribed improves patient outcomes in heart failure (reduces hospitalizations, improves symptoms, increases survival) Side effects of medications can be managed by decreasing doses, switching agents, stopping drugs, or adding additional therapy. Please let someone in the [facility] know if you have having bothersome side effects so we can modify your regimen. Do not alter your medication regimen without talking to [place].  Medication reminders can help patients remember to take drugs on time. If you are missing or forgetting doses you can try linking behaviors, using pill boxes, or an electronic reminder like an alarm on your phone or an app. Some people can also get automated phone calls as medication reminders.

## 2022-09-24 DIAGNOSIS — I5022 Chronic systolic (congestive) heart failure: Secondary | ICD-10-CM | POA: Diagnosis not present

## 2022-09-24 DIAGNOSIS — I10 Essential (primary) hypertension: Secondary | ICD-10-CM | POA: Diagnosis not present

## 2022-09-24 DIAGNOSIS — N184 Chronic kidney disease, stage 4 (severe): Secondary | ICD-10-CM | POA: Diagnosis not present

## 2022-09-24 DIAGNOSIS — N2581 Secondary hyperparathyroidism of renal origin: Secondary | ICD-10-CM | POA: Diagnosis not present

## 2022-09-24 DIAGNOSIS — E1122 Type 2 diabetes mellitus with diabetic chronic kidney disease: Secondary | ICD-10-CM | POA: Diagnosis not present

## 2022-09-24 DIAGNOSIS — D631 Anemia in chronic kidney disease: Secondary | ICD-10-CM | POA: Diagnosis not present

## 2022-09-30 ENCOUNTER — Emergency Department: Payer: PPO

## 2022-09-30 ENCOUNTER — Other Ambulatory Visit: Payer: Self-pay

## 2022-09-30 ENCOUNTER — Emergency Department
Admission: EM | Admit: 2022-09-30 | Discharge: 2022-09-30 | Disposition: A | Discharge status: Home / Self Care | Payer: PPO | Attending: Emergency Medicine | Admitting: Emergency Medicine

## 2022-09-30 DIAGNOSIS — E1122 Type 2 diabetes mellitus with diabetic chronic kidney disease: Secondary | ICD-10-CM | POA: Diagnosis not present

## 2022-09-30 DIAGNOSIS — I110 Hypertensive heart disease with heart failure: Secondary | ICD-10-CM | POA: Diagnosis not present

## 2022-09-30 DIAGNOSIS — I509 Heart failure, unspecified: Secondary | ICD-10-CM | POA: Insufficient documentation

## 2022-09-30 DIAGNOSIS — N189 Chronic kidney disease, unspecified: Secondary | ICD-10-CM | POA: Diagnosis not present

## 2022-09-30 DIAGNOSIS — N184 Chronic kidney disease, stage 4 (severe): Secondary | ICD-10-CM | POA: Diagnosis not present

## 2022-09-30 DIAGNOSIS — I10 Essential (primary) hypertension: Secondary | ICD-10-CM | POA: Diagnosis not present

## 2022-09-30 DIAGNOSIS — I130 Hypertensive heart and chronic kidney disease with heart failure and stage 1 through stage 4 chronic kidney disease, or unspecified chronic kidney disease: Secondary | ICD-10-CM | POA: Insufficient documentation

## 2022-09-30 DIAGNOSIS — R0602 Shortness of breath: Secondary | ICD-10-CM | POA: Diagnosis not present

## 2022-09-30 DIAGNOSIS — D631 Anemia in chronic kidney disease: Secondary | ICD-10-CM | POA: Diagnosis not present

## 2022-09-30 DIAGNOSIS — R079 Chest pain, unspecified: Secondary | ICD-10-CM | POA: Diagnosis not present

## 2022-09-30 LAB — BASIC METABOLIC PANEL
Anion gap: 13 (ref 5–15)
BUN: 50 mg/dL — ABNORMAL HIGH (ref 8–23)
CO2: 21 mmol/L — ABNORMAL LOW (ref 22–32)
Calcium: 9 mg/dL (ref 8.9–10.3)
Chloride: 103 mmol/L (ref 98–111)
Creatinine, Ser: 2.58 mg/dL — ABNORMAL HIGH (ref 0.44–1.00)
GFR, Estimated: 17 mL/min — ABNORMAL LOW (ref >60)
Glucose, Bld: 207 mg/dL — ABNORMAL HIGH (ref 70–99)
Potassium: 4 mmol/L (ref 3.5–5.1)
Sodium: 137 mmol/L (ref 135–145)

## 2022-09-30 LAB — TROPONIN I (HIGH SENSITIVITY)
Troponin I (High Sensitivity): 126 ng/L (ref <18)
Troponin I (High Sensitivity): 146 ng/L (ref <18)

## 2022-09-30 LAB — CBC
HCT: 37.7 % (ref 36.0–46.0)
Hemoglobin: 12.6 g/dL (ref 12.0–15.0)
MCH: 26.5 pg (ref 26.0–34.0)
MCHC: 33.4 g/dL (ref 30.0–36.0)
MCV: 79.2 fL — ABNORMAL LOW (ref 80.0–100.0)
Platelets: 190 10*3/uL (ref 150–400)
RBC: 4.76 MIL/uL (ref 3.87–5.11)
RDW: 15.9 % — ABNORMAL HIGH (ref 11.5–15.5)
WBC: 8.1 10*3/uL (ref 4.0–10.5)
nRBC: 0 % (ref 0.0–0.2)

## 2022-09-30 MED ORDER — LACTATED RINGERS IV BOLUS
500.0000 mL | Freq: Once | INTRAVENOUS | Status: AC
  Administered 2022-09-30: 500 mL via INTRAVENOUS

## 2022-09-30 NOTE — Discharge Instructions (Addendum)
Your EKG, chest x-ray, and lab tests were all okay today.  Continue taking your medications as instructed by Dr. Clayborn Bigness, and follow-up in his office.  Return to the emergency room if you have new or worsening symptoms.

## 2022-09-30 NOTE — ED Triage Notes (Signed)
Pt to ED via POV from home. Pt reports increased SOB that is worse with exertion. Pt also reports intermittent left sided CP.

## 2022-09-30 NOTE — Progress Notes (Signed)
Patient ID: Jordan Thornton, female    DOB: [birth year], 89 y.o.   MRN: [id number]  HPI  Jordan Thornton is a 89 y/o female with a history of HTN, DM, hyperlipidemia, GERD, previous tobacco use and chronic heart failure.   Echo on 04/05/22 showed EF 30-35%. Echo 02/01/22 left ventricular EF 30-35%  Was in the ED 09/30/22 due to SOB. Was in the ED 08/03/22 due to sciatica on the left side. Admitted 07/27/22 due to complaints of orthopnea. BNP greater than 4500. Cardiology consult obtained. Received IV lasix couple doses.Creatinine slight bump. Discharged after 3 days. Was in the ED on 04/03/22 for dyspnea, elevated troponin. NSTEMI and acute on chronic systolic heart failure.  She presents today for a follow-up visit with a chief complaint of intermittent chest pain. She says that this isn't occurring much at all now. Has associated difficulty sleeping along with this. Denies any abdominal distention, palpitations, pedal edema, shortness of breath, cough, dizziness, fatigue or weight gain.   Says that yesterday, she felt "so bad" and could barely put one foot in front of the other so went to the ED where everything checked out ok. She was concerned about dehydration although reports feeling like she was drinking enough fluids.   Past Medical History:  Diagnosis Date   CHF (congestive heart failure) (HCC)    Diabetes mellitus without complication (HCC)    GERD (gastroesophageal reflux disease)    Hyperlipidemia    Hypertension    Past Surgical History:  Procedure Laterality Date   bladder tack     BREAST BIOPSY Left 10-15 yrs ago   benign   CATARACT EXTRACTION Bilateral 05/2015   CHOLECYSTECTOMY OPEN     VAGINAL HYSTERECTOMY     Family History  Problem Relation Age of Onset   Heart disease Maternal Aunt    Heart disease Maternal Grandmother    Stroke Father    Breast cancer Neg Hx    Social History   Tobacco Use   Smoking status: Former    Packs/day: 0.25    Years: 25.00     Total pack years: 6.25    Types: Cigarettes    Quit date: 1982    Years since quitting: 42.0   Smokeless tobacco: Never   Tobacco comments:    smoking cessation materials not required  Substance Use Topics   Alcohol use: No    Alcohol/week: 0.0 standard drinks of alcohol   Allergies  Allergen Reactions   Sertraline Other (See Comments)    Made her more depressed   Prior to Admission medications   Medication Sig Start Date End Date Taking? Authorizing Provider  Acetaminophen (TYLENOL ARTHRITIS PAIN PO) Take 2 tablets by mouth daily as needed.    Yes [provider]  ADVOCATE LANCETS MISC use once daily as directed to test blood sugar 04/24/15  Yes Juline Patch, MD  aspirin EC 81 MG tablet Take 1 tablet (81 mg total) by mouth daily. Swallow whole. 02/02/22  Yes Lorella Nimrod, MD  atorvastatin (LIPITOR) 40 MG tablet Take 1 tablet (40 mg total) by mouth every evening. 04/06/22  Yes Wieting, Richard, MD  Blood Glucose Monitoring Suppl (FREESTYLE LITE) DEVI use once daily as directed to test blood sugar 04/24/15  Yes Juline Patch, MD  clopidogrel (PLAVIX) 75 MG tablet Take 1 tablet (75 mg total) by mouth daily. 04/07/22  Yes Loletha Grayer, MD  Elderberry 575 MG/5ML SYRP Take 1 Dose by mouth daily.    Yes [provider]  feeding supplement (ENSURE ENLIVE / ENSURE PLUS) LIQD Take 237 mLs by mouth 2 (two) times daily between meals. 07/30/22  Yes Fritzi Mandes, MD  furosemide (LASIX) 20 MG tablet Take 0.5 tablets (10 mg total) by mouth daily. Patient taking differently: Take 20 mg by mouth daily. 08/01/22  Yes Fritzi Mandes, MD  hydrALAZINE (APRESOLINE) 25 MG tablet Take 25 mg by mouth 2 (two) times daily.   Yes [provider]  isosorbide mononitrate (IMDUR) 30 MG 24 hr tablet Take 30 mg by mouth daily. 07/21/22 07/21/23 Yes [provider]  Lancet Devices (ADVOCATE LANCING DEVICE) MISC use once daily as directed to test blood sugar 04/24/15  Yes Juline Patch, MD  lansoprazole (PREVACID) 15 MG capsule Take 1 capsule by mouth once daily Patient taking differently: Take 15 mg by mouth daily at 6 (six) AM. 06/23/21  Yes Juline Patch, MD  LORazepam (ATIVAN) 0.5 MG tablet Take 0.5 mg by mouth as needed for anxiety.   Yes [provider]  metoprolol succinate (TOPROL-XL) 25 MG 24 hr tablet Take 0.5 tablets (12.5 mg total) by mouth at bedtime. 04/06/22  Yes Wieting, Richard, MD  Multiple Vitamin (MULTIVITAMIN WITH MINERALS) TABS tablet Take 1 tablet by mouth daily. 07/30/22  Yes Fritzi Mandes, MD  sitaGLIPtin (JANUVIA) 25 MG tablet Take 1 tablet (25 mg total) by mouth daily. 08/18/22  Yes Juline Patch, MD    Review of Systems  Constitutional:  Negative for appetite change and fatigue.  HENT:  Negative for congestion, postnasal drip and sore throat.   Eyes: Negative.   Respiratory:  Negative for apnea, cough and shortness of breath.   Cardiovascular:  Positive for chest pain (under left breast at times). Negative for palpitations and leg swelling.  Gastrointestinal:  Negative for abdominal distention and abdominal pain.  Endocrine: Negative.   Genitourinary:  Negative for difficulty urinating and frequency.  Musculoskeletal:  Positive for arthralgias (left sciatic pain).  Skin: Negative.   Allergic/Immunologic: Negative.   Neurological:  Negative for dizziness, weakness, light-headedness and headaches.  Hematological:  Negative for adenopathy. Does not bruise/bleed easily.  Psychiatric/Behavioral:  Positive for sleep disturbance. Negative for dysphoric mood. The patient is not nervous/anxious.    Vitals:   10/01/22 0953  BP: 107/65  Pulse: 99  Resp: 16  SpO2: 100%  Weight: 117 lb (53.1 kg)   Wt Readings from Last 3 Encounters:  10/01/22 117 lb (53.1 kg)  09/22/22 118 lb (53.5 kg)  08/21/22 118 lb (53.5 kg)   Lab Results  Component Value Date   CREATININE 2.58 (H) 09/30/2022   CREATININE 2.70 (H) 07/30/2022   CREATININE 2.43  (H) 07/29/2022   Physical Exam Vitals and nursing note reviewed. Exam conducted with a chaperone present (sister).  Constitutional:      Appearance: Normal appearance. She is normal weight.  HENT:     Head: Normocephalic and atraumatic.  Cardiovascular:     Rate and Rhythm: Normal rate and regular rhythm.     Pulses: Normal pulses.     Heart sounds: Normal heart sounds.  Pulmonary:     Effort: Pulmonary effort is normal.     Breath sounds: No wheezing or rales.  Abdominal:     General: There is no distension.     Palpations: Abdomen is soft.     Tenderness: There is no abdominal tenderness.  Musculoskeletal:        General: Normal range of motion.     Cervical  back: Neck supple.     Right lower leg: No edema.     Left lower leg: No edema.  Skin:    General: Skin is warm and dry.  Neurological:     General: No focal deficit present.     Mental Status: She is alert and oriented to person, place, and time.  Psychiatric:        Mood and Affect: Mood normal.        Behavior: Behavior normal.        Thought Content: Thought content normal.        Judgment: Judgment normal.    Assessment and Plan:  1) Chronic Heart failure with reduced ejection fraction- - NYHA Class I - euvolemic today - weighing daily, reminded to call for overnight weight gain of 3 lbs or greater and weekly gain of 5 lbs or greater  - weight stable from last visit here 1 week ago - on GDMT of metoprolol succinate  - renal function limits other GDMT - not adding salt to her food - saw cardiology Clayborn Bigness) 09/23/22  - says that she enjoys playing the piano and doing puzzles; used to teach piano  - encouraged physical activity as her symptoms allow - BNP 09/22/22 was 3283.6 - have received her flu, pneumonia & RSV vaccines for this season - PharmD reconciled medications w/ patient  2) HTN- - BP 107/65 - PCP Ronnald Ramp) 08/05/22 - BMP 09/30/22 reviewed and showed Sodium 137, Potassium 4.0, Creatinine 2.58 and  eGFR 17  3) DM- - saw nephrology Holley Raring) 09/24/22 - A1c 07/02/22 was 6.7% - home glucose today was 115  4: Hyperlipidemia- - will add zetia 10mg  daily   Medication list reviewed.   Due to HF stability, will not make a return appointment at this time. Advised her that she could call back at anytime with questions/ concerns and she was comfortable with this plan.

## 2022-09-30 NOTE — ED Provider Notes (Signed)
[facility] Provider Note    Event Date/Time   First MD Initiated Contact with Patient 09/30/22 1943     (approximate)   History   Chief Complaint: Shortness of Breath   HPI  Jordan Thornton is a 89 y.o. female with a history of diabetes, hypertension, hyperlipidemia, CKD, CHF who comes ED complaining of dyspnea on exertion earlier today.  Symptoms are recurrent, have been happening over the past several weeks.  She saw her cardiologist Dr. Clayborn Bigness 1 week ago where they discussed similar symptoms.  He made some medication adjustments including patient taking hydralazine 25 mg 3 times daily and Lasix 20 mg daily and continuing her other medications.  She reports she has made these changes.  She checks her weight every day, normally her weight is about 117 pounds but today it was 116.2.  She does feel that she may have decreased her oral intake due to fluctuating appetite recently.  Denies fever or other new symptoms.  Currently chest pain and shortness of breath have resolved.     Physical Exam   Triage Vital Signs: ED Triage Vitals  Enc Vitals Group     BP 09/30/22 1828 114/73     Pulse Rate 09/30/22 1828 (!) 101     Resp 09/30/22 1828 18     Temp 09/30/22 1828 98 F (36.7 C)     Temp Source 09/30/22 1828 Oral     SpO2 09/30/22 1828 94 %     Weight --      Height --      Head Circumference --      Peak Flow --      Pain Score 09/30/22 1825 4     Pain Loc --      Pain Edu? --      Excl. in [place]? --     Most recent vital signs: Vitals:   09/30/22 2100 09/30/22 2130  BP: 113/73 (!) 105/91  Pulse: 87 72  Resp: (!) 22 (!) 24  Temp:    SpO2: 98% 100%    General: Awake, no distress.  CV:  Good peripheral perfusion.  Regular rate and rhythm Resp:  Normal effort.  Clear to auscultation bilaterally Abd:  No distention.  Soft nontender Other:  No lower extremity edema, no calf tenderness.  No JVD.   ED Results / Procedures / Treatments    Labs (all labs ordered are listed, but only abnormal results are displayed) Labs Reviewed  BASIC METABOLIC PANEL - Abnormal; Notable for the following components:      Result Value   CO2 21 (*)    Glucose, Bld 207 (*)    BUN 50 (*)    Creatinine, Ser 2.58 (*)    GFR, Estimated 17 (*)    All other components within normal limits  CBC - Abnormal; Notable for the following components:   MCV 79.2 (*)    RDW 15.9 (*)    All other components within normal limits  TROPONIN I (HIGH SENSITIVITY) - Abnormal; Notable for the following components:   Troponin I (High Sensitivity) 126 (*)    All other components within normal limits  TROPONIN I (HIGH SENSITIVITY) - Abnormal; Notable for the following components:   Troponin I (High Sensitivity) 146 (*)    All other components within normal limits     EKG Interpreted by me Sinus rhythm rate of 88.  Left axis, normal intervals.  Poor R wave progression.  Normal ST segments and  T waves, no ischemic changes   RADIOLOGY Chest x-ray interpreted by me, appears unremarkable.  Radiology report reviewed   PROCEDURES:  Procedures   MEDICATIONS ORDERED IN ED: Medications  lactated ringers bolus 500 mL (0 mLs Intravenous Stopped 09/30/22 2209)     IMPRESSION / MDM / ASSESSMENT AND PLAN / ED COURSE  I reviewed the triage vital signs and the nursing notes.                              Differential diagnosis includes, but is not limited to, dehydration, electrolyte abnormality, anemia, non-STEMI, pleural effusion, pneumonia, AKI  Patient's presentation is most consistent with acute presentation with potential threat to life or bodily function.  Patient presents with recurrent atypical chest pain and dyspnea on exertion which have been going on for several weeks.  Today's episode is described as more intense than usual, but has similarly resolved.  EKG, troponins, other serum labs all appear stable.  Chest x-ray unremarkable.  Vital signs  reassuring as is examination.  Recommend patient continue all her medications as instructed by Dr. Clayborn Bigness and continue to follow-up.  I do not think there is any benefit to admission currently.  She is stable for outpatient follow-up.       FINAL CLINICAL IMPRESSION(S) / ED DIAGNOSES   Final diagnoses:  Shortness of breath  Chronic congestive heart failure, unspecified heart failure type ([place])     Rx / DC Orders   ED Discharge Orders     None        Note:  This document was prepared using Dragon voice recognition software and may include unintentional dictation errors.   Carrie Mew, MD 09/30/22 2228

## 2022-09-30 NOTE — ED Notes (Signed)
Lab reports troponin results; acuity level changed and will take pt to nexxt available exam room

## 2022-10-01 ENCOUNTER — Telehealth: Payer: Self-pay

## 2022-10-01 ENCOUNTER — Encounter: Payer: Self-pay | Admitting: Pharmacy Technician

## 2022-10-01 ENCOUNTER — Ambulatory Visit: Payer: PPO | Attending: Family | Admitting: Family

## 2022-10-01 ENCOUNTER — Encounter: Payer: Self-pay | Admitting: Family

## 2022-10-01 VITALS — BP 107/65 | HR 99 | Resp 16 | Wt 117.0 lb

## 2022-10-01 DIAGNOSIS — E119 Type 2 diabetes mellitus without complications: Secondary | ICD-10-CM | POA: Diagnosis not present

## 2022-10-01 DIAGNOSIS — Z8249 Family history of ischemic heart disease and other diseases of the circulatory system: Secondary | ICD-10-CM | POA: Diagnosis not present

## 2022-10-01 DIAGNOSIS — I252 Old myocardial infarction: Secondary | ICD-10-CM | POA: Insufficient documentation

## 2022-10-01 DIAGNOSIS — E785 Hyperlipidemia, unspecified: Secondary | ICD-10-CM | POA: Insufficient documentation

## 2022-10-01 DIAGNOSIS — K219 Gastro-esophageal reflux disease without esophagitis: Secondary | ICD-10-CM | POA: Diagnosis not present

## 2022-10-01 DIAGNOSIS — Z87891 Personal history of nicotine dependence: Secondary | ICD-10-CM | POA: Diagnosis not present

## 2022-10-01 DIAGNOSIS — I110 Hypertensive heart disease with heart failure: Secondary | ICD-10-CM | POA: Diagnosis not present

## 2022-10-01 DIAGNOSIS — E1169 Type 2 diabetes mellitus with other specified complication: Secondary | ICD-10-CM | POA: Diagnosis not present

## 2022-10-01 DIAGNOSIS — Z79899 Other long term (current) drug therapy: Secondary | ICD-10-CM | POA: Insufficient documentation

## 2022-10-01 DIAGNOSIS — I5022 Chronic systolic (congestive) heart failure: Secondary | ICD-10-CM | POA: Diagnosis not present

## 2022-10-01 DIAGNOSIS — I10 Essential (primary) hypertension: Secondary | ICD-10-CM

## 2022-10-01 MED ORDER — EZETIMIBE 10 MG PO TABS: 10.0000 mg | ORAL_TABLET | Freq: Every day | ORAL | 5 refills | Status: DC

## 2022-10-01 NOTE — Patient Instructions (Addendum)
Continue weighing daily and call for an overnight weight gain of 3 pounds or more or a weekly weight gain of more than 5 pounds.   If you have voicemail, please make sure your mailbox is cleaned out so that we may leave a message and please make sure to listen to any voicemails.    Start taking zetia to help with lowering your lipids.    Call us in the future if you need [place] for anything

## 2022-10-01 NOTE — Progress Notes (Signed)
[facility] - PHARMACIST COUNSELING NOTE  Guideline-Directed Medical Therapy/Evidence Based Medicine  ACE/ARB/ARNI:  None Beta Blocker: Metoprolol succinate 25 mg daily Aldosterone Antagonist:  None Diuretic: Furosemide 20 mg daily SGLT2i:  None  Adherence Assessment  Do you ever forget to take your medication? [] Yes [x] No  Do you ever skip doses due to side effects? [] Yes [x] No  Do you have trouble affording your medicines? [x] Yes [] No  Are you ever unable to pick up your medication due to transportation difficulties? [] Yes [x] No  Do you ever stop taking your medications because you don't believe they are helping? [] Yes [x] No  Do you check your weight daily? [x] Yes [] No   Adherence strategy: Pill box  Barriers to obtaining medications: None  Vital signs: HR 99, BP 107, weight (pounds) 117 ECHO: Date 07/2022, EF 25-30%, notes LV global hypokinesis; Mild RV enlargement     Latest Ref Rng & Units 09/30/2022    6:27 PM 07/30/2022    6:04 AM 07/29/2022    4:53 AM  BMP  Glucose 70 - 99 mg/dL 207  102  105   BUN 8 - 23 mg/dL 50  61  57   Creatinine 0.44 - 1.00 mg/dL 2.58  2.70  2.43   Sodium 135 - 145 mmol/L 137  139  141   Potassium 3.5 - 5.1 mmol/L 4.0  4.1  3.7   Chloride 98 - 111 mmol/L 103  103  104   CO2 22 - 32 mmol/L 21  27  25    Calcium 8.9 - 10.3 mg/dL 9.0  9.2  9.4     Past Medical History:  Diagnosis Date   CHF (congestive heart failure) (HCC)    Diabetes mellitus without complication (HCC)    GERD (gastroesophageal reflux disease)    Hyperlipidemia    Hypertension     ASSESSMENT 89 year old female with PMH HTN, CAD (with NSTEMI in 03/2022), T2DM, HLD, CKD4 (BL eGFR <20) who presents to the HF clinic for follow-up. Most recent ECHO in 07/2022 shows EF 25-30%. Regarding GDMT, patient taking metoprolol succinate 25 mg daily. Patient also takes furosemide 20 mg daily. BP is on low end of normal today, 107/65.  Patient presented to ED yesterday for SOB, possibly attributed to dehydration and patient received fluids. Today, rales noted in LLL on exam.  Recent ED Visit (past 6 months): Date - 09/30/2022, CC - SOB Date - 08/03/2022, CC - Sciatica Date - 07/27/2022, CC - AoCHF Date - 04/03/2022, CC - Dyspnea  PLAN CHF/HTN Continue Toprol XL and furosemide eGFR precludes initiation of SGLT2i and MRA If additional BP control or GDMT is desired in the future, recommend consideration of losartan 12.5 mg daily  HLD / CAD (h/o NSTEMI) 03/2022 LDL 117 Recommend initiation of ezetimibe 10 mg daily Recommend rechecking LDL in 1-3 months Continue atorvastatin, Imdur, Plavix, bASA NSTEMI was in 03/2022 >> per cardiology, DAPT should continue at least through 09/2022, ideally longer  Time spent: 20 minutes  Will M. Ouida Sills, PharmD PGY-1 Pharmacy Resident 10/01/2022 11:22 AM   Current Outpatient Medications:    Acetaminophen (TYLENOL ARTHRITIS PAIN PO), Take 2 tablets by mouth daily as needed. , Disp: , Rfl:    ADVOCATE LANCETS MISC, use once daily as directed to test blood sugar, Disp: 100 each, Rfl: 0   aspirin EC 81 MG tablet, Take 1 tablet (81 mg total) by mouth daily. Swallow whole., Disp: 30 tablet, Rfl: 12   atorvastatin (LIPITOR) 40  MG tablet, Take 1 tablet (40 mg total) by mouth every evening., Disp: 30 tablet, Rfl: 0   Blood Glucose Monitoring Suppl (FREESTYLE LITE) DEVI, use once daily as directed to test blood sugar, Disp: 1 each, Rfl: 0   clopidogrel (PLAVIX) 75 MG tablet, Take 1 tablet (75 mg total) by mouth daily., Disp: 30 tablet, Rfl: 0   Elderberry 575 MG/5ML SYRP, Take 1 Dose by mouth daily. , Disp: , Rfl:    ezetimibe (ZETIA) 10 MG tablet, Take 1 tablet (10 mg total) by mouth daily., Disp: 30 tablet, Rfl: 5   feeding supplement (ENSURE ENLIVE / ENSURE PLUS) LIQD, Take 237 mLs by mouth 2 (two) times daily between meals., Disp: 237 mL, Rfl: 12   furosemide (LASIX) 20 MG tablet, Take 0.5  tablets (10 mg total) by mouth daily. (Patient taking differently: Take 20 mg by mouth daily.), Disp: 30 tablet, Rfl: 0   hydrALAZINE (APRESOLINE) 25 MG tablet, Take 25 mg by mouth 2 (two) times daily., Disp: , Rfl:    isosorbide mononitrate (IMDUR) 30 MG 24 hr tablet, Take 30 mg by mouth daily., Disp: , Rfl:    Lancet Devices (ADVOCATE LANCING DEVICE) MISC, use once daily as directed to test blood sugar, Disp: 1 each, Rfl: 0   lansoprazole (PREVACID) 15 MG capsule, Take 1 capsule by mouth once daily (Patient taking differently: Take 15 mg by mouth daily at 6 (six) AM.), Disp: 90 capsule, Rfl: 0   LORazepam (ATIVAN) 0.5 MG tablet, Take 0.5 mg by mouth as needed for anxiety., Disp: , Rfl:    metoprolol succinate (TOPROL-XL) 25 MG 24 hr tablet, Take 0.5 tablets (12.5 mg total) by mouth at bedtime., Disp: 15 tablet, Rfl: 0   Multiple Vitamin (MULTIVITAMIN WITH MINERALS) TABS tablet, Take 1 tablet by mouth daily., Disp: 30 tablet, Rfl: 0   sitaGLIPtin (JANUVIA) 25 MG tablet, Take 1 tablet (25 mg total) by mouth daily., Disp: 90 tablet, Rfl: 0   DRUGS TO CAUTION IN HEART FAILURE  Drug or Class Mechanism  Analgesics NSAIDs COX-2 inhibitors Glucocorticoids  Sodium and water retention, increased systemic vascular resistance, decreased response to diuretics   Diabetes Medications Metformin Thiazolidinediones Rosiglitazone (Avandia) Pioglitazone (Actos) DPP4 Inhibitors Saxagliptin (Onglyza) Sitagliptin (Januvia)   Lactic acidosis Possible calcium channel blockade   Unknown  Antiarrhythmics Class I  Flecainide Disopyramide Class III Sotalol Other Dronedarone  Negative inotrope, proarrhythmic   Proarrhythmic, beta blockade  Negative inotrope  Antihypertensives Alpha Blockers Doxazosin Calcium Channel Blockers Diltiazem Verapamil Nifedipine Central Alpha Adrenergics Moxonidine Peripheral Vasodilators Minoxidil  Increases renin and aldosterone  Negative  inotrope    Possible sympathetic withdrawal  Unknown  Anti-infective Itraconazole Amphotericin B  Negative inotrope Unknown  Hematologic Anagrelide Cilostazol   Possible inhibition of PD IV Inhibition of PD III causing arrhythmias  Neurologic/Psychiatric Stimulants Anti-Seizure Drugs Carbamazepine Pregabalin Antidepressants Tricyclics Citalopram Parkinsons Bromocriptine Pergolide Pramipexole Antipsychotics Clozapine Antimigraine Ergotamine Methysergide Appetite suppressants Bipolar Lithium  Peripheral alpha and beta agonist activity  Negative inotrope and chronotrope Calcium channel blockade  Negative inotrope, proarrhythmic Dose-dependent QT prolongation  Excessive serotonin activity/valvular damage Excessive serotonin activity/valvular damage Unknown  IgE mediated hypersensitivy, calcium channel blockade  Excessive serotonin activity/valvular damage Excessive serotonin activity/valvular damage Valvular damage  Direct myofibrillar degeneration, adrenergic stimulation  Antimalarials Chloroquine Hydroxychloroquine Intracellular inhibition of lysosomal enzymes  Urologic Agents Alpha Blockers Doxazosin Prazosin Tamsulosin Terazosin  Increased renin and aldosterone  Adapted from Page Carleene Overlie, et al. "Drugs That May Cause or Exacerbate Heart Failure: A Scientific Statement  from the [place]." Circulation 2016; 628:Z66-Q94. DOI: 10.1161/CIR.0000000000000426   MEDICATION ADHERENCES TIPS AND STRATEGIES Taking medication as prescribed improves patient outcomes in heart failure (reduces hospitalizations, improves symptoms, increases survival) Side effects of medications can be managed by decreasing doses, switching agents, stopping drugs, or adding additional therapy. Please let someone in the [facility] know if you have having bothersome side effects so we can modify your regimen. Do not alter your medication regimen without  talking to [place].  Medication reminders can help patients remember to take drugs on time. If you are missing or forgetting doses you can try linking behaviors, using pill boxes, or an electronic reminder like an alarm on your phone or an app. Some people can also get automated phone calls as medication reminders.

## 2022-10-01 NOTE — Telephone Encounter (Signed)
Transition Care Management Follow-up Telephone Call Date of discharge and from where: 01/17/2024[facility] How have you been since you were released from the hospital? Doing better today- no shortness of breath Any questions or concerns? No  Items Reviewed: Did the pt receive and understand the discharge instructions provided? Yes  Medications obtained and verified? Yes  Other?  Started Zetia with cardio today Any new allergies since your discharge? No  Dietary orders reviewed? Yes Do you have support at home? Yes   Home Care and Equipment/Supplies: none  Functional Questionnaire: (I = Independent and D = Dependent) ADLs: I  Bathing/Dressing- I  Meal Prep- I  Eating- I  Maintaining continence- I  Transferring/Ambulation- I  Managing Meds- I  Follow up appointments reviewed:  PCP Hospital f/u appt confirmed? Pt is going to see Dr Ronnald Ramp next month, does not wish to come in before then. [facility] f/u appt confirmed?  Saw heart clinic today and will see Dr Holley Raring in February   Are transportation arrangements needed? No  If their condition worsens, is the pt aware to call PCP or go to the Emergency Dept.? Yes Was the patient provided with contact information for the PCP's office or ED? Yes Was to pt encouraged to call back with questions or concerns? Yes

## 2022-10-04 ENCOUNTER — Emergency Department
Admission: EM | Admit: 2022-10-04 | Discharge: 2022-10-04 | Disposition: A | Discharge status: Home / Self Care | Payer: PPO | Attending: Emergency Medicine | Admitting: Emergency Medicine

## 2022-10-04 DIAGNOSIS — R339 Retention of urine, unspecified: Secondary | ICD-10-CM | POA: Diagnosis present

## 2022-10-04 DIAGNOSIS — N390 Urinary tract infection, site not specified: Secondary | ICD-10-CM | POA: Diagnosis not present

## 2022-10-04 LAB — URINALYSIS, ROUTINE W REFLEX MICROSCOPIC
Bilirubin Urine: NEGATIVE
Glucose, UA: NEGATIVE mg/dL
Hgb urine dipstick: NEGATIVE
Ketones, ur: NEGATIVE mg/dL
Nitrite: NEGATIVE
Protein, ur: NEGATIVE mg/dL
Specific Gravity, Urine: 1.013 (ref 1.005–1.030)
pH: 5 (ref 5.0–8.0)

## 2022-10-04 MED ORDER — CEPHALEXIN 500 MG PO CAPS: 500.0000 mg | ORAL_CAPSULE | Freq: Two times a day (BID) | ORAL | 0 refills | Status: DC

## 2022-10-04 MED ORDER — CEPHALEXIN 500 MG PO CAPS
500.0000 mg | ORAL_CAPSULE | Freq: Once | ORAL | Status: AC
  Administered 2022-10-04: 500 mg via ORAL
  Filled 2022-10-04: qty 1

## 2022-10-04 NOTE — ED Triage Notes (Signed)
Pt sts that she has been not able to use the bathroom like she normally does. Pt sts that she has attempted to use the bathroom three times today but has only gone once.

## 2022-10-04 NOTE — ED Provider Notes (Signed)
[facility] Provider Note    Event Date/Time   First MD Initiated Contact with Patient 10/04/22 1445     (approximate)   History   Urinary Retention   HPI  Jordan Thornton is a 89 y.o. female who presents with complaints of difficulty urinating.  Patient reports over the last 24 hours she has had times where it is difficult and uncomfortable to urinate.  She denies fevers or chills.  No back pain reported.  No nausea or vomiting.  Otherwise is feeling well     Physical Exam   Triage Vital Signs: ED Triage Vitals  Enc Vitals Group     BP 10/04/22 1418 110/77     Pulse Rate 10/04/22 1418 (!) 41     Resp 10/04/22 1418 18     Temp 10/04/22 1418 98.2 F (36.8 C)     Temp Source 10/04/22 1418 Oral     SpO2 10/04/22 1418 92 %     Weight 10/04/22 1419 53.1 kg (117 lb)     Height --      Head Circumference --      Peak Flow --      Pain Score 10/04/22 1418 0     Pain Loc --      Pain Edu? --      Excl. in [place]? --     Most recent vital signs: Vitals:   10/04/22 1545 10/04/22 1635  BP: 112/74   Pulse: (!) 48 72  Resp: 18   Temp: 98.1 F (36.7 C)   SpO2: 94%      General: Awake, no distress.  CV:  Good peripheral perfusion.  Resp:  Normal effort.  Abd:  No distention.  Soft, nontender, no CVA tenderness Other:     ED Results / Procedures / Treatments   Labs (all labs ordered are listed, but only abnormal results are displayed) Labs Reviewed  URINALYSIS, ROUTINE W REFLEX MICROSCOPIC - Abnormal; Notable for the following components:      Result Value   Color, Urine YELLOW (*)    APPearance HAZY (*)    Leukocytes,Ua TRACE (*)    Bacteria, UA MANY (*)    All other components within normal limits  URINE CULTURE     EKG     RADIOLOGY     PROCEDURES:  Critical Care performed:   Procedures   MEDICATIONS ORDERED IN ED: Medications  cephALEXin (KEFLEX) capsule 500 mg (500 mg Oral Given 10/04/22 1638)      IMPRESSION / MDM / ASSESSMENT AND PLAN / ED COURSE  I reviewed the triage vital signs and the nursing notes. Patient's presentation is most consistent with acute complicated illness / injury requiring diagnostic workup.  Patient presents with dysuria as above, suspicious for UTI.  Overall reassuring exam, no concern for [place], not consistent with kidney stone  Urinalysis demonstrates many bacteria trace leukocytes, given her dysuria we will treat, culture sent, outpatient follow-up recommended, return precautions discussed        FINAL CLINICAL IMPRESSION(S) / ED DIAGNOSES   Final diagnoses:  Lower urinary tract infectious disease     Rx / DC Orders   ED Discharge Orders          Ordered    cephALEXin (KEFLEX) 500 MG capsule  2 times daily        10/04/22 1635             Note:  This document was prepared using Dragon voice  recognition software and may include unintentional dictation errors.   Lavonia Drafts, MD 10/04/22 Joen Laura

## 2022-10-05 ENCOUNTER — Telehealth: Payer: Self-pay

## 2022-10-05 NOTE — Telephone Encounter (Signed)
Transition Care Management Follow-up Telephone Call Date of discharge and from where: [facility] 10/04/2022 How have you been since you were released from the hospital? Doing good since home and on medicine. Any questions or concerns? No  Items Reviewed: Did the pt receive and understand the discharge instructions provided? Yes  Medications obtained and verified? Yes  Other? No  Any new allergies since your discharge? No  Dietary orders reviewed? Yes Do you have support at home? Yes   Home Care and Equipment/Supplies: none  Functional Questionnaire: (I = Independent and D = Dependent) ADLs: I  Bathing/Dressing- I  Meal Prep- I  Eating- I  Maintaining continence- I  Transferring/Ambulation- I  Managing Meds- I  Follow up appointments reviewed:  PCP Hospital f/u appt confirmed? Yes  Scheduled to see Jan 30  @ 200. [facility] f/u appt confirmed?  None needed   Are transportation arrangements needed? Yes  If their condition worsens, is the pt aware to call PCP or go to the Emergency Dept.? Yes Was the patient provided with contact information for the PCP's office or ED? Yes Was to pt encouraged to call back with questions or concerns? Yes

## 2022-10-06 ENCOUNTER — Other Ambulatory Visit: Payer: Self-pay

## 2022-10-06 ENCOUNTER — Encounter: Payer: Self-pay | Admitting: Emergency Medicine

## 2022-10-06 DIAGNOSIS — Z5321 Procedure and treatment not carried out due to patient leaving prior to being seen by health care provider: Secondary | ICD-10-CM | POA: Diagnosis not present

## 2022-10-06 DIAGNOSIS — R339 Retention of urine, unspecified: Secondary | ICD-10-CM | POA: Insufficient documentation

## 2022-10-06 DIAGNOSIS — R300 Dysuria: Secondary | ICD-10-CM | POA: Insufficient documentation

## 2022-10-06 LAB — CBC WITH DIFFERENTIAL/PLATELET
Abs Immature Granulocytes: 0.04 10*3/uL (ref 0.00–0.07)
Basophils Absolute: 0 10*3/uL (ref 0.0–0.1)
Basophils Relative: 0 %
Eosinophils Absolute: 0 10*3/uL (ref 0.0–0.5)
Eosinophils Relative: 0 %
HCT: 34.5 % — ABNORMAL LOW (ref 36.0–46.0)
Hemoglobin: 11.7 g/dL — ABNORMAL LOW (ref 12.0–15.0)
Immature Granulocytes: 1 %
Lymphocytes Relative: 14 %
Lymphs Abs: 1.1 10*3/uL (ref 0.7–4.0)
MCH: 26.4 pg (ref 26.0–34.0)
MCHC: 33.9 g/dL (ref 30.0–36.0)
MCV: 77.7 fL — ABNORMAL LOW (ref 80.0–100.0)
Monocytes Absolute: 0.4 10*3/uL (ref 0.1–1.0)
Monocytes Relative: 5 %
Neutro Abs: 6.5 10*3/uL (ref 1.7–7.7)
Neutrophils Relative %: 80 %
Platelets: 186 10*3/uL (ref 150–400)
RBC: 4.44 MIL/uL (ref 3.87–5.11)
RDW: 15.7 % — ABNORMAL HIGH (ref 11.5–15.5)
WBC: 8.1 10*3/uL (ref 4.0–10.5)
nRBC: 0 % (ref 0.0–0.2)

## 2022-10-06 LAB — URINE CULTURE: Culture: >=100000 — AB

## 2022-10-06 LAB — COMPREHENSIVE METABOLIC PANEL
ALT: 126 U/L — ABNORMAL HIGH (ref 0–44)
AST: 88 U/L — ABNORMAL HIGH (ref 15–41)
Albumin: 3.5 g/dL (ref 3.5–5.0)
Alkaline Phosphatase: 52 U/L (ref 38–126)
Anion gap: 12 (ref 5–15)
BUN: 67 mg/dL — ABNORMAL HIGH (ref 8–23)
CO2: 19 mmol/L — ABNORMAL LOW (ref 22–32)
Calcium: 8.7 mg/dL — ABNORMAL LOW (ref 8.9–10.3)
Chloride: 99 mmol/L (ref 98–111)
Creatinine, Ser: 2.53 mg/dL — ABNORMAL HIGH (ref 0.44–1.00)
GFR, Estimated: 18 mL/min — ABNORMAL LOW (ref >60)
Glucose, Bld: 149 mg/dL — ABNORMAL HIGH (ref 70–99)
Potassium: 3.6 mmol/L (ref 3.5–5.1)
Sodium: 130 mmol/L — ABNORMAL LOW (ref 135–145)
Total Bilirubin: 1.5 mg/dL — ABNORMAL HIGH (ref 0.3–1.2)
Total Protein: 6.1 g/dL — ABNORMAL LOW (ref 6.5–8.1)

## 2022-10-06 NOTE — ED Provider Triage Note (Signed)
Emergency Medicine Provider Triage Evaluation Note  Jordan Thornton , a 89 y.o. female  was evaluated in triage.  Pt complains of feelings of urinary retention. Treated for UTI 2 days ago. No fever, abd pain.  Review of Systems  Positive: Dysuria, urinary retention Negative: Fever, abd pain  Physical Exam  BP 120/86   Pulse 99   Temp 97.6 F (36.4 C) (Oral)   Resp 18   SpO2 100%  Gen:   Awake, no distress   Resp:  Normal effort  MSK:   Moves extremities without difficulty  Other:    Medical Decision Making  Medically screening exam initiated at 4:01 PM.  Appropriate orders placed.  Jordan Thornton was informed that the remainder of the evaluation will be completed by another provider, this initial triage assessment does not replace that evaluation, and the importance of remaining in the ED until their evaluation is complete.  Labs, urine culture, bladder  scan   Darletta Moll, PA-C 10/06/22 1607

## 2022-10-06 NOTE — Telephone Encounter (Signed)
Pt called and would like Jordan Thornton to call her back.  Se is having difficulty urinating.  Please advise.  She as recently in the Er for this.  CB#  [phone]

## 2022-10-06 NOTE — ED Notes (Signed)
Bladder scan read 12mL at this time.

## 2022-10-06 NOTE — ED Triage Notes (Signed)
Patient to ED via POV for urinary retention. Patient states she has been able to urinate like normal today. States she also feels nauseous. Seen for same on 1/21 and given medication with no relief.

## 2022-10-07 ENCOUNTER — Telehealth: Payer: Self-pay | Admitting: Emergency Medicine

## 2022-10-07 ENCOUNTER — Emergency Department
Admission: EM | Admit: 2022-10-07 | Discharge: 2022-10-07 | Discharge status: Against Medical Advice | Payer: PPO | Attending: Emergency Medicine | Admitting: Emergency Medicine

## 2022-10-07 DIAGNOSIS — R0602 Shortness of breath: Secondary | ICD-10-CM | POA: Diagnosis not present

## 2022-10-07 DIAGNOSIS — Z7902 Long term (current) use of antithrombotics/antiplatelets: Secondary | ICD-10-CM | POA: Diagnosis not present

## 2022-10-07 DIAGNOSIS — Z1152 Encounter for screening for COVID-19: Secondary | ICD-10-CM | POA: Diagnosis not present

## 2022-10-07 DIAGNOSIS — Z7982 Long term (current) use of aspirin: Secondary | ICD-10-CM | POA: Diagnosis not present

## 2022-10-07 DIAGNOSIS — Z515 Encounter for palliative care: Secondary | ICD-10-CM | POA: Diagnosis not present

## 2022-10-07 DIAGNOSIS — N184 Chronic kidney disease, stage 4 (severe): Secondary | ICD-10-CM | POA: Diagnosis not present

## 2022-10-07 DIAGNOSIS — I471 Supraventricular tachycardia, unspecified: Secondary | ICD-10-CM | POA: Diagnosis not present

## 2022-10-07 DIAGNOSIS — R339 Retention of urine, unspecified: Secondary | ICD-10-CM | POA: Diagnosis not present

## 2022-10-07 DIAGNOSIS — R7989 Other specified abnormal findings of blood chemistry: Secondary | ICD-10-CM | POA: Diagnosis not present

## 2022-10-07 DIAGNOSIS — E7849 Other hyperlipidemia: Secondary | ICD-10-CM | POA: Diagnosis not present

## 2022-10-07 DIAGNOSIS — I255 Ischemic cardiomyopathy: Secondary | ICD-10-CM | POA: Diagnosis not present

## 2022-10-07 DIAGNOSIS — I130 Hypertensive heart and chronic kidney disease with heart failure and stage 1 through stage 4 chronic kidney disease, or unspecified chronic kidney disease: Secondary | ICD-10-CM | POA: Diagnosis not present

## 2022-10-07 DIAGNOSIS — I129 Hypertensive chronic kidney disease with stage 1 through stage 4 chronic kidney disease, or unspecified chronic kidney disease: Secondary | ICD-10-CM | POA: Diagnosis not present

## 2022-10-07 DIAGNOSIS — I252 Old myocardial infarction: Secondary | ICD-10-CM | POA: Diagnosis not present

## 2022-10-07 DIAGNOSIS — Z87891 Personal history of nicotine dependence: Secondary | ICD-10-CM | POA: Diagnosis not present

## 2022-10-07 DIAGNOSIS — I4719 Other supraventricular tachycardia: Secondary | ICD-10-CM | POA: Diagnosis not present

## 2022-10-07 DIAGNOSIS — I272 Pulmonary hypertension, unspecified: Secondary | ICD-10-CM | POA: Diagnosis not present

## 2022-10-07 DIAGNOSIS — E785 Hyperlipidemia, unspecified: Secondary | ICD-10-CM | POA: Diagnosis not present

## 2022-10-07 DIAGNOSIS — I5084 End stage heart failure: Secondary | ICD-10-CM | POA: Diagnosis not present

## 2022-10-07 DIAGNOSIS — I509 Heart failure, unspecified: Secondary | ICD-10-CM | POA: Diagnosis not present

## 2022-10-07 DIAGNOSIS — F419 Anxiety disorder, unspecified: Secondary | ICD-10-CM | POA: Diagnosis not present

## 2022-10-07 DIAGNOSIS — K219 Gastro-esophageal reflux disease without esophagitis: Secondary | ICD-10-CM | POA: Diagnosis not present

## 2022-10-07 DIAGNOSIS — I5023 Acute on chronic systolic (congestive) heart failure: Secondary | ICD-10-CM | POA: Diagnosis not present

## 2022-10-07 DIAGNOSIS — I340 Nonrheumatic mitral (valve) insufficiency: Secondary | ICD-10-CM | POA: Diagnosis not present

## 2022-10-07 DIAGNOSIS — J811 Chronic pulmonary edema: Secondary | ICD-10-CM | POA: Diagnosis not present

## 2022-10-07 DIAGNOSIS — T445X5A Adverse effect of predominantly beta-adrenoreceptor agonists, initial encounter: Secondary | ICD-10-CM | POA: Diagnosis not present

## 2022-10-07 DIAGNOSIS — Z452 Encounter for adjustment and management of vascular access device: Secondary | ICD-10-CM | POA: Diagnosis not present

## 2022-10-07 DIAGNOSIS — E1122 Type 2 diabetes mellitus with diabetic chronic kidney disease: Secondary | ICD-10-CM | POA: Diagnosis not present

## 2022-10-07 DIAGNOSIS — D631 Anemia in chronic kidney disease: Secondary | ICD-10-CM | POA: Diagnosis not present

## 2022-10-07 DIAGNOSIS — F32A Depression, unspecified: Secondary | ICD-10-CM | POA: Diagnosis not present

## 2022-10-07 DIAGNOSIS — I081 Rheumatic disorders of both mitral and tricuspid valves: Secondary | ICD-10-CM | POA: Diagnosis not present

## 2022-10-07 DIAGNOSIS — I2510 Atherosclerotic heart disease of native coronary artery without angina pectoris: Secondary | ICD-10-CM | POA: Diagnosis not present

## 2022-10-07 DIAGNOSIS — R570 Cardiogenic shock: Secondary | ICD-10-CM | POA: Diagnosis not present

## 2022-10-07 DIAGNOSIS — I517 Cardiomegaly: Secondary | ICD-10-CM | POA: Diagnosis not present

## 2022-10-07 DIAGNOSIS — J90 Pleural effusion, not elsewhere classified: Secondary | ICD-10-CM | POA: Diagnosis not present

## 2022-10-07 NOTE — ED Notes (Signed)
No answer when called several times from lobby 

## 2022-10-07 NOTE — Telephone Encounter (Signed)
Called patient due to left emergency department before provider exam to inquire about condition and follow up plans.  No answer and no vm on mobile phone listed Fam member answered home phone.  [facility].

## 2022-10-08 ENCOUNTER — Telehealth: Payer: Self-pay

## 2022-10-08 NOTE — Telephone Encounter (Signed)
        Patient  visited [place] on 1/21    Telephone encounter attempt :  1st  A HIPAA compliant voice message was left requesting a return call.  Instructed patient to call back.    [facility] Care Guide, [place], Care Management  [phone] [address], [place], [place] [address] Phone: [phone] Email: [email or web address]       

## 2022-10-09 ENCOUNTER — Telehealth: Payer: Self-pay

## 2022-10-09 NOTE — Telephone Encounter (Signed)
     Patient  visit on [place]   at 1/21  Patient declined call   Have you been able to follow up with your primary care physician?na   The patient was or was not able to obtain any needed medicine or equipment. Na   Are there diet recommendations that you are having difficulty following? Na   Patient expresses understanding of discharge instructions and education provided has no other needs at this time.  [place], [facility], Care Management  [phone] [address], [place], [place] [address] Phone: [phone] Email: [email or web address]

## 2022-10-13 ENCOUNTER — Ambulatory Visit: Payer: PPO | Admitting: Family Medicine

## 2022-10-21 DIAGNOSIS — I5023 Acute on chronic systolic (congestive) heart failure: Secondary | ICD-10-CM | POA: Diagnosis not present

## 2022-10-22 ENCOUNTER — Telehealth: Payer: Self-pay | Admitting: *Deleted

## 2022-10-22 DIAGNOSIS — I5023 Acute on chronic systolic (congestive) heart failure: Secondary | ICD-10-CM | POA: Diagnosis not present

## 2022-10-22 NOTE — Patient Outreach (Signed)
  Care Coordination [facility] Note Transition Care Management Follow-up Telephone Call Date of discharge and from where: [facility] [phone] How have you been since you were released from the hospital? Doing real good Any questions or concerns? Yes She was suppose to get a transfer bench, walker and shower chair. Patient only received the walker. Per daughter she does not need the transfer bench. RN gave the daughter the number for the agency in case she changes her mind or want to follow up on the shower chair.   Items Reviewed: Did the pt receive and understand the discharge instructions provided? Yes  Medications obtained and verified? Yes  Other? No  Any new allergies since your discharge? No  Dietary orders reviewed? No Do you have support at home? Yes   Home Care and Equipment/Supplies: Were home health services ordered? yes If so, what is the name of the agency? [facility]  Has the agency set up a time to come to the patient's home? yes Were any new equipment or medical supplies ordered?  Yes: Walker, transfer bench and shower chair What is the name of the medical supply agency? [facility]  Were you able to get the supplies/equipment? Received walker only Do you have any questions related to the use of the equipment or supplies? Yes: Phone number given to patient daughter for items not received if she decides she wants them [phone]  Functional Questionnaire: (I = Independent and D = Dependent) ADLs: D  Bathing/Dressing- I  Meal Prep- D  Eating- I  Maintaining continence- I  Transferring/Ambulation- I  Managing Meds- D  Follow up appointments reviewed:  PCP Hospital f/u appt confirmed? Yes  Scheduled to see Dr Ronnald Ramp [id number] 10:20 . [facility] f/u appt confirmed? Yes   [id number] Oliver Pila [address] [id number] Dr Holley Raring 11:40 [id number] [facility] nephrology 2:00. Are transportation arrangements needed? No  If their condition worsens,  is the pt aware to call PCP or go to the Emergency Dept.? Yes Was the patient provided with contact information for the PCP's office or ED? Yes Was to pt encouraged to call back with questions or concerns? Yes  SDOH assessments and interventions completed:   Yes SDOH Interventions Today    Flowsheet Row Most Recent Value  SDOH Interventions   Food Insecurity Interventions Intervention Not Indicated  Housing Interventions Intervention Not Indicated  Transportation Interventions Intervention Not Indicated       Care Coordination Interventions:  No Care Coordination interventions needed at this time.   Encounter Outcome:  Pt. Visit Completed    [place] Management [phone]

## 2022-10-23 DIAGNOSIS — I5023 Acute on chronic systolic (congestive) heart failure: Secondary | ICD-10-CM | POA: Diagnosis not present

## 2022-10-26 ENCOUNTER — Other Ambulatory Visit: Payer: Self-pay | Admitting: Family Medicine

## 2022-10-27 DIAGNOSIS — I5023 Acute on chronic systolic (congestive) heart failure: Secondary | ICD-10-CM | POA: Diagnosis not present

## 2022-10-27 NOTE — Telephone Encounter (Signed)
Requested medication (s) are due for refill today: yes  Requested medication (s) are on the active medication list: no  Last refill:  unknown  Future visit scheduled: yes  Notes to clinic:  Unable to refill per protocol, last refill by another provider. Historical provider.     Requested Prescriptions  Pending Prescriptions Disp Refills   LORazepam (ATIVAN) 0.5 MG tablet [Pharmacy Med Name: LORazepam 0.5 MG Oral Tablet] 15 tablet 0    Sig: TAKE 1 TABLET BY MOUTH AS NEEDED PER  30  DAYS     Not Delegated - Psychiatry: Anxiolytics/Hypnotics 2 Failed - 10/26/2022  1:08 PM      Failed - This refill cannot be delegated      Failed - Urine Drug Screen completed in last 360 days      Passed - Patient is not pregnant      Passed - Valid encounter within last 6 months    Recent Outpatient Visits           2 months ago Spondylosis of lumbosacral region without myelopathy or radiculopathy   [place] [place], Earley Abide, MD   2 months ago Sciatica of left side   [facility] at [facility], MD   3 months ago Type 2 diabetes mellitus without complication, without long-term current use of insulin ([place])   [place] [facility] at [facility], MD   6 months ago Coronary artery disease involving native heart without angina pectoris, unspecified vessel or lesion type   [facility] at [facility], MD   7 months ago Type 2 diabetes mellitus without complication, without long-term current use of insulin ([place])    [facility] at [place], [place], MD       Future Appointments             In 1 week Juline Patch, MD [place] at [facility], [facility]

## 2022-10-28 DIAGNOSIS — I5023 Acute on chronic systolic (congestive) heart failure: Secondary | ICD-10-CM | POA: Diagnosis not present

## 2022-10-28 DIAGNOSIS — E785 Hyperlipidemia, unspecified: Secondary | ICD-10-CM | POA: Diagnosis not present

## 2022-10-28 DIAGNOSIS — E1169 Type 2 diabetes mellitus with other specified complication: Secondary | ICD-10-CM | POA: Diagnosis not present

## 2022-10-28 DIAGNOSIS — N184 Chronic kidney disease, stage 4 (severe): Secondary | ICD-10-CM | POA: Diagnosis not present

## 2022-10-28 DIAGNOSIS — I2510 Atherosclerotic heart disease of native coronary artery without angina pectoris: Secondary | ICD-10-CM | POA: Diagnosis not present

## 2022-10-28 DIAGNOSIS — E782 Mixed hyperlipidemia: Secondary | ICD-10-CM | POA: Diagnosis not present

## 2022-10-28 DIAGNOSIS — I5084 End stage heart failure: Secondary | ICD-10-CM | POA: Diagnosis not present

## 2022-10-28 DIAGNOSIS — D631 Anemia in chronic kidney disease: Secondary | ICD-10-CM | POA: Diagnosis not present

## 2022-10-28 DIAGNOSIS — I10 Essential (primary) hypertension: Secondary | ICD-10-CM | POA: Diagnosis not present

## 2022-10-28 DIAGNOSIS — I5022 Chronic systolic (congestive) heart failure: Secondary | ICD-10-CM | POA: Diagnosis not present

## 2022-10-31 DIAGNOSIS — I5023 Acute on chronic systolic (congestive) heart failure: Secondary | ICD-10-CM | POA: Diagnosis not present

## 2022-11-03 ENCOUNTER — Ambulatory Visit (INDEPENDENT_AMBULATORY_CARE_PROVIDER_SITE_OTHER): Payer: PPO | Admitting: Family Medicine

## 2022-11-03 ENCOUNTER — Encounter: Payer: Self-pay | Admitting: Family Medicine

## 2022-11-03 VITALS — BP 100/70 | HR 68 | Ht 64.0 in | Wt 122.0 lb

## 2022-11-03 DIAGNOSIS — I5023 Acute on chronic systolic (congestive) heart failure: Secondary | ICD-10-CM | POA: Diagnosis not present

## 2022-11-03 DIAGNOSIS — H6123 Impacted cerumen, bilateral: Secondary | ICD-10-CM

## 2022-11-03 DIAGNOSIS — R159 Full incontinence of feces: Secondary | ICD-10-CM

## 2022-11-03 DIAGNOSIS — I5022 Chronic systolic (congestive) heart failure: Secondary | ICD-10-CM | POA: Diagnosis not present

## 2022-11-03 DIAGNOSIS — I872 Venous insufficiency (chronic) (peripheral): Secondary | ICD-10-CM | POA: Diagnosis not present

## 2022-11-03 MED ORDER — CARBAMIDE PEROXIDE 6.5 % OT SOLN: 5.0000 [drp] | Freq: Two times a day (BID) | OTIC | 0 refills | Status: DC

## 2022-11-03 NOTE — Progress Notes (Signed)
Date:  11/03/2022   Name:  Jordan Thornton   DOB:  02/01/1930   MRN:  [id number]   Chief Complaint: No chief complaint on file.  GI Problem The primary symptoms include nausea. Primary symptoms do not include abdominal pain, melena or hematochezia.  The illness does not include chills. Associated symptoms comments: Fecal incontinence.    Lab Results  Component Value Date   NA 130 (L) 10/06/2022   K 3.6 10/06/2022   CO2 19 (L) 10/06/2022   GLUCOSE 149 (H) 10/06/2022   BUN 67 (H) 10/06/2022   CREATININE 2.53 (H) 10/06/2022   CALCIUM 8.7 (L) 10/06/2022   EGFR 27 (L) 07/03/2021   GFRNONAA 18 (L) 10/06/2022   Lab Results  Component Value Date   CHOL 197 04/05/2022   HDL 67 04/05/2022   LDLCALC 117 (H) 04/05/2022   TRIG 67 04/05/2022   CHOLHDL 2.9 04/05/2022   No results found for: "TSH" Lab Results  Component Value Date   HGBA1C 6.7 (H) 07/02/2022   Lab Results  Component Value Date   WBC 8.1 10/06/2022   HGB 11.7 (L) 10/06/2022   HCT 34.5 (L) 10/06/2022   MCV 77.7 (L) 10/06/2022   PLT 186 10/06/2022   Lab Results  Component Value Date   ALT 126 (H) 10/06/2022   AST 88 (H) 10/06/2022   ALKPHOS 52 10/06/2022   BILITOT 1.5 (H) 10/06/2022   No results found for: "25OHVITD2", "25OHVITD3", "VD25OH"   Review of Systems  Constitutional:  Negative for chills.  Respiratory:  Negative for shortness of breath and wheezing.   Cardiovascular:  Negative for chest pain and palpitations.  Gastrointestinal:  Positive for nausea. Negative for abdominal pain, hematochezia and melena.       Fecal incontinence  Endocrine: Negative for polydipsia and polyuria.    Patient Active Problem List   Diagnosis Date Noted   Spondylosis of lumbosacral region without myelopathy or radiculopathy 08/22/2022   CKD (chronic kidney disease), stage IV ([place]) 07/28/2022   Anemia due to chronic kidney disease 07/28/2022   Acute kidney injury superimposed on CKD ([place]) 04/06/2022   Acute  on chronic systolic CHF (congestive heart failure) ([place]) 04/04/2022   NSTEMI (non-ST elevated myocardial infarction) ([place]) 04/04/2022   Hypotension 04/04/2022   Abnormal chest x-ray    Syncope 01/31/2022   Type 2 diabetes mellitus with hyperlipidemia ([place]) 01/31/2022   Depression with anxiety 01/31/2022   Chronic diastolic CHF (congestive heart failure) ([place]) 01/31/2022   Urinary tract infection, acute 01/31/2022   Elevated troponin 01/31/2022   H/O total hysterectomy with bilateral salpingo-oophorectomy (BSO) 10/29/2020   Anxiety 10/29/2020   Acute hypoxemic respiratory failure due to COVID-19 ([place]) 09/19/2019   Stage 3 chronic kidney disease ([place]) 09/02/2019   Diabetes mellitus with no complication ([place]) 0000000   Familial multiple lipoprotein-type hyperlipidemia 01/03/2015   Creatinine elevation 01/03/2015   Diabetes ([place]) 12/15/2014   Hyperlipidemia 12/15/2014   Hypertension 12/15/2014   Gastro-esophageal reflux disease without esophagitis 12/15/2014    Allergies  Allergen Reactions   Sertraline Other (See Comments)    Made her more depressed    Past Surgical History:  Procedure Laterality Date   bladder tack     BREAST BIOPSY Left 10-15 yrs ago   benign   CATARACT EXTRACTION Bilateral 05/2015   CHOLECYSTECTOMY OPEN     VAGINAL HYSTERECTOMY      Social History   Tobacco Use   Smoking status: Former    Packs/day: 0.25  Years: 25.00    Total pack years: 6.25    Types: Cigarettes    Quit date: 53    Years since quitting: 42.1   Smokeless tobacco: Never   Tobacco comments:    smoking cessation materials not required  Vaping Use   Vaping Use: Never used  Substance Use Topics   Alcohol use: No    Alcohol/week: 0.0 standard drinks of alcohol   Drug use: No     Medication list has been reviewed and updated.  Current Meds  Medication Sig   ADVOCATE LANCETS MISC use once daily as directed to test blood sugar   atorvastatin (LIPITOR) 40 MG tablet Take  1 tablet (40 mg total) by mouth every evening.   Blood Glucose Monitoring Suppl (FREESTYLE LITE) DEVI use once daily as directed to test blood sugar   clopidogrel (PLAVIX) 75 MG tablet Take 1 tablet (75 mg total) by mouth daily.   ezetimibe (ZETIA) 10 MG tablet Take 1 tablet (10 mg total) by mouth daily.   feeding supplement (ENSURE ENLIVE / ENSURE PLUS) LIQD Take 237 mLs by mouth 2 (two) times daily between meals.   Lancet Devices (ADVOCATE LANCING DEVICE) MISC use once daily as directed to test blood sugar   LORazepam (ATIVAN) 0.5 MG tablet TAKE 1 TABLET BY MOUTH AS NEEDED PER  30  DAYS   Multiple Vitamin (MULTIVITAMIN WITH MINERALS) TABS tablet Take 1 tablet by mouth daily.   sitaGLIPtin (JANUVIA) 25 MG tablet Take 1 tablet (25 mg total) by mouth daily.   torsemide (DEMADEX) 10 MG tablet Take 10 mg by mouth once. Card[facility]       11/03/2022   10:54 AM 08/05/2022   11:42 AM 07/02/2022    9:21 AM 04/30/2022   11:34 AM  GAD 7 : Generalized Anxiety Score  Nervous, Anxious, on Edge 1 0 0 0  Control/stop worrying 0 0 0 0  Worry too much - different things 0 0 0 0  Trouble relaxing 0 0 0 0  Restless 0 0 0 0  Easily annoyed or irritable 0 0 0 0  Afraid - awful might happen 0 0 0 0  Total GAD 7 Score 1 0 0 0  Anxiety Difficulty Not difficult at all Not difficult at all Not difficult at all Not difficult at all       11/03/2022   10:52 AM 08/05/2022   11:42 AM 07/02/2022    9:21 AM  Depression screen PHQ 2/9  Decreased Interest 0 0 0  Down, Depressed, Hopeless 1 0 0  PHQ - 2 Score 1 0 0  Altered sleeping 0 0 0  Tired, decreased energy 1 0 0  Change in appetite 0 0 0  Feeling bad or failure about yourself  0 0 0  Trouble concentrating 0 0 0  Moving slowly or fidgety/restless 0 0 0  Suicidal thoughts 0 0 0  PHQ-9 Score 2 0 0  Difficult doing work/chores Somewhat difficult Not difficult at all Not difficult at all    BP Readings from Last 3 Encounters:  11/03/22 100/70   10/06/22 119/78  10/04/22 112/74    Physical Exam Constitutional:      Appearance: Normal appearance.  HENT:     Right Ear: There is impacted cerumen.     Left Ear: There is impacted cerumen.     Nose: Nose normal.     Mouth/Throat:     Mouth: Mucous membranes are moist.  Cardiovascular:  Heart sounds: Normal heart sounds, S1 normal and S2 normal. No murmur heard.    No systolic murmur is present.     No diastolic murmur is present.     No friction rub. No gallop. No S3 or S4 sounds.     Comments: Minimal pitting edema Pulmonary:     Breath sounds: Examination of the right-lower field reveals decreased breath sounds. Examination of the left-lower field reveals decreased breath sounds. Decreased breath sounds present. No wheezing or rhonchi.  Abdominal:     Palpations: Abdomen is soft. There is no hepatomegaly or splenomegaly.     Tenderness: There is no abdominal tenderness.  Neurological:     Mental Status: She is alert.     Wt Readings from Last 3 Encounters:  11/03/22 122 lb (55.3 kg)  10/04/22 117 lb (53.1 kg)  10/01/22 117 lb (53.1 kg)    BP 100/70   Pulse 68   Ht 5' 4"$  (1.626 m)   Wt 122 lb (55.3 kg)   BMI 20.94 kg/m   Assessment and Plan:  1. Incontinence of feces, unspecified fecal incontinence type New onset.  Patient has formed balls better, now but they are "slimy "and a few will fall out which feels like she needs to go to the bathroom for bowel movement.  I have encouraged her to use restroom facilities for bowel movements on a more frequent basis for evacuation and to add Metamucil to firm up her bowels.  Patient is soft with no tenderness I do not think she is constipated or impacted at this time but there may be something to have concerns of in the future. - Ambulatory referral to Gastroenterology  2. Bilateral impacted cerumen Patient feels like her head is in a bubble on evaluation there is some impaction and this may be creating this feeling.   I have suggested using Debrox to soften wax and bulb syringe for clearance in the shower.  3. Venous insufficiency Patient is primarily sitting with legs in a dependent position-encouraged that they elevate legs to the level of the heart whenever possible and to find compression stockings to use during the day but not to wear at night.  4. Chronic systolic (congestive) heart failure (HCC) Patient recently discharged with a diagnosis of acute on chronic systolic heart failure and furosemide was changed to torsemide.  I have suggested to keep the legs level to her heart and to walk more monitored whenever possible to encourage pumping action of the calves and reduce lower extremity swelling.. - Ambulatory referral to Home Health    Otilio Miu, MD

## 2022-11-03 NOTE — Patient Instructions (Addendum)
Psyllium Capsules What is this medication? PSYLLIUM (SIL i yum) may support digestion and heart health. It works by increasing the bulk of your stool. This increases pressure, which helps the muscles in your intestine move stool. It also reduces the amount of cholesterol your body absorbs from food. This medicine may be used for other purposes; ask your health care provider or pharmacist if you have questions. COMMON BRAND NAME(S): GenFiber, Konsyl, Metamucil, Metamucil MultiHealth, Natural Fiber Laxative, Reguloid What should I tell my care team before I take this medication? They need to know if you have any of these conditions: Blockage in your bowel Difficulty swallowing Inflammatory bowel disease Stomach or intestine problems Sudden change in bowel habits lasting more than 2 weeks An unusual or allergic reaction to psyllium, other medications, dyes, or preservatives Pregnant or trying or get pregnant Breast-feeding How should I use this medication? Take this medication by mouth with a full glass of water. Follow the directions on the package labeling, or take as directed by your care team. Take your medication at regular intervals. Do not take your medication more often than directed. Talk to your care team about the use of this medication in children. While this medication may be prescribed for children as young as 59 years of age for selected conditions, precautions do apply. Overdosage: If you think you have taken too much of this medicine contact a poison control center or emergency room at once. NOTE: This medicine is only for you. Do not share this medicine with others. What if I miss a dose? If you miss a dose, take it as soon as you can. If it is almost time for your next dose, take only that dose. Do not take double or extra doses. What may interact with this medication? Interactions are not expected. Take this product at least 2 hours before or after other medications. This  list may not describe all possible interactions. Give your health care provider a list of all the medicines, herbs, non-prescription drugs, or dietary supplements you use. Also tell them if you smoke, drink alcohol, or use illegal drugs. Some items may interact with your medicine. What should I watch for while using this medication? Check with your care team if your symptoms do not start to get better or if they get worse. Stop using this medication and contact your care team if you have rectal bleeding or if you have to treat your constipation for more than 1 week. These could be signs of a more serious condition. Drink several glasses of water a day while you are taking this medication. This will help to relieve constipation and prevent dehydration. What side effects may I notice from receiving this medication? Side effects that you should report to your care team as soon as possible: Allergic reactions--skin rash, itching, hives, swelling of the face, lips, tongue, or throat Choking--chest pain, trouble swallowing or breathing, vomiting Side effects that usually do not require medical attention (report to your care team if they continue or are bothersome): Bloating Diarrhea Gas Nausea Stomach pain This list may not describe all possible side effects. Call your doctor for medical advice about side effects. You may report side effects to FDA at 1-800-FDA-1088. Where should I keep my medication? Keep out of the reach of children and pets. Store at room temperature between 15 and 30 degrees C (59 and 86 degrees F). Protect from moisture. Get rid of any unused medication after the expiration date. To get rid of medications  that are no longer needed or have expired: Take the medication to a medication take-back program. Check with your pharmacy or law enforcement to find a location. If you cannot return the medication, check the label or package insert to see if the medication should be thrown out  in the garbage or flushed down the toilet. If you are not sure, ask your care team. If it is safe to put it in the trash, pour the medication out of the container. Mix the medication with cat litter, dirt, coffee grounds, or other unwanted substance. Seal the mixture in a bag or container. Put it in the trash. NOTE: This sheet is a summary. It may not cover all possible information. If you have questions about this medicine, talk to your doctor, pharmacist, or health care provider.  2023 Elsevier/Gold Standard (2006-03-11 00:00:00) Skin Care and Bowel Hygiene   Anyone who has frequent bowel movements, diarrhea, or bowel leakage (fecal incontinence) may experience soreness or skin irritation around the anal region.  Occasionally, the skin can become so inflamed that it breaks into open sores.  Prevent skin breakdown by following good skin care habits.  Cleaning and Washing Techniques After having a bowel movement, men and women should tighten their anal sphincter before wiping.  Women should always wipe from front to back to prevent fecal matter from getting into the urethra and vagina.   Tips for Cleaning and Washing wipe from front to back towards the anus always wipe gently with soft toilet paper, or ideally with moist toilet paper wipe only once with each piece of toilet paper so as not to re-contaminate the area wash in warm water alone or with a minimal amount of mild, fragrance-free soap use non-biological washing powder gently pat skin completely dry, avoiding rubbing if drying the skin after washing is difficult or uncomfortable, try using a hairdryer on a low setting (use very carefully) allow air to get to the irritated area for some part of every day use protective skin creams containing zinc as recommended by your doctor  What To Avoid baths with extra-hot water soaking for long periods of time in the bathtub disinfectants and antiseptics  bath oils, bath salts, and talcum  powder using plastic pants, pads, and sheets, which cause sweating scratching at the irritated area  Additional Tips some people find that citrus and acidic foods cause or worsen skin irritation eat a healthy, balanced diet that is high in fiber  drink plenty of fluids wear cotton underwear to allow the skin to breath talk to your healthcare provider about further treatment options; persistent problems need medical attention   2007, [employer] Doc.25 Fecal Incontinence Fecal incontinence, also called accidental bowel leakage, is an inability to control your bowels. This means stool (feces) leaks from the rectum. This condition happens because the nerves or muscles around the anus do not work the way they should. The anus is the opening between the buttocks. What are the causes? This condition may be caused by: Damage to the muscles at the end of the rectum (sphincter). Damage to the nerves that control bowel movements. Diarrhea. Chronic constipation. Pelvic floor dysfunction. This means the muscles in the pelvis do not work well. Loss of bowel storage capacity. This occurs when the rectum can no longer stretch in size in order to store feces. Inflammatory bowel disease (IBD), such as Crohn's disease. Irritable bowel syndrome (IBS). What increases the risk? You are more likely to develop this condition if: You were born  with bowels or a pelvis that did not form correctly. You have had rectal surgery. You have had radiation treatment for certain cancers. You have been pregnant, had a vaginal delivery, or had surgery that damaged the pelvic floor muscles. You have nerve damage that affects the rectum or anus: You had a complicated childbirth, spinal cord injury, or other trauma that caused nerve damage. You have a condition that can affect nerve function, such as diabetes, Parkinson's disease, or multiple sclerosis (MS). You have a condition where the rectum drops down  into the anus or vagina (prolapse). You are 89 years of age or older. What are the signs or symptoms? The main symptom of this condition is not being able to control your bowels. You also might not be able to get to the bathroom before a bowel movement. Other symptoms may include: Diarrhea. Constipation. Discomfort in the abdomen. How is this diagnosed? This condition is diagnosed with a medical history and physical exam. You may also have other tests, including: Blood and urine tests. A rectal exam. Ultrasound or MRI. Defecography. This is a test that uses a special liquid to see how the rectum and anus perform during a bowel movement. Colonoscopy. This is an exam that looks at your large intestine (colon). Anal manometry. This is a test that measures the strength of the anal sphincter. Anal electromyogram (EMG). This is a test that uses small electrodes to check for nerve damage. How is this treated? Treatment for this condition depends on the cause and severity. Treatment may also focus on addressing any underlying causes of this condition. Treatment may include: Medicines, such as medicines to: Prevent diarrhea. Help with constipation (bulk-forming laxatives). Treat any underlying conditions. Biofeedback therapy. This can help retrain muscles that are affected. Fiber supplements. These can help manage your bowel movements. Nerve stimulation. An injectable gel to promote tissue growth and better muscle control. Surgery. You may need: Sphincter repair surgery. Diversion surgery. This procedure lets feces pass out of your body through a hole in your abdomen. Follow these instructions at home: Eating and drinking  Follow instructions from your health care provider about what you may eat and drink. Work with a dietitian to help you avoid foods and drinks that can make your condition worse. These include: Spicy foods. Fatty and greasy foods. Cured or smoked meat. Dairy  products. Drinks with caffeine in them. Keep a diet diary to find out which foods or drinks could be making your condition worse. Do not drink alcohol. Drink enough fluid to keep your urine pale yellow. Lifestyle Do not use any products that contain nicotine or tobacco. These products include cigarettes, chewing tobacco, and vaping devices, such as e-cigarettes. If you need help quitting, ask your health care provider. If you are overweight, talk with your health care provider about how to safely lose weight. This may help your condition. Increase your physical activity as told by your health care provider. This may help your condition. Always talk with your health care provider before starting a new exercise program. Carry a change of clothes and supplies to clean up quickly if you have an episode of fecal incontinence. Consider joining a fecal incontinence support group. You can find a support group online or in your local community. General instructions  Take over-the-counter and prescription medicines only as told by your health care provider. This includes any supplements. Apply a moisture barrier, such as petroleum jelly, to the anus. This protects the skin from irritation caused by ongoing  leaking or diarrhea. Tell your health care provider if you are upset or depressed about your condition. Where to find more information International Foundation for Functional Gastrointestinal Disorders: iffgd.[place] of Gastroenterology: patients.gi.org Contact a health care provider if: You have a fever. You have redness, swelling, or pain around your anus. Your pain is getting worse or you lose feeling in your rectal area. You have blood in your stool. You feel sad or hopeless. You avoid social or work situations. Get help right away if: You stop having bowel movements. You cannot eat or drink without vomiting. You have rectal bleeding that does not stop. You have severe pain  that is getting worse. You have symptoms of dehydration, including: Drowsiness or tiredness (fatigue). Little or no urine, tears, or sweat. Dizziness. Dry mouth. Unusual irritability. Headache. Inability to think clearly. Summary Fecal incontinence, also called accidental bowel leakage, is an inability to control your bowels. This condition happens because the nerves or muscles around the anus do not work the way they should. Treatment depends on the cause and the severity of the symptoms. Follow instructions from your health care provider about what you may eat and drink, lifestyle changes, and skin care. Take over-the-counter and prescription medicines only as told by your health care provider. Tell your health care provider if your symptoms worsen or if you are upset or depressed about your condition. This information is not intended to replace advice given to you by your health care provider. Make sure you discuss any questions you have with your health care provider. Document Revised: 11/25/2021 Document Reviewed: 11/25/2021 Elsevier Patient Education  [place].

## 2022-11-04 ENCOUNTER — Telehealth: Payer: Self-pay

## 2022-11-04 ENCOUNTER — Telehealth: Payer: Self-pay | Admitting: Family Medicine

## 2022-11-04 DIAGNOSIS — I340 Nonrheumatic mitral (valve) insufficiency: Secondary | ICD-10-CM | POA: Diagnosis not present

## 2022-11-04 DIAGNOSIS — N179 Acute kidney failure, unspecified: Secondary | ICD-10-CM | POA: Diagnosis not present

## 2022-11-04 DIAGNOSIS — I130 Hypertensive heart and chronic kidney disease with heart failure and stage 1 through stage 4 chronic kidney disease, or unspecified chronic kidney disease: Secondary | ICD-10-CM | POA: Diagnosis not present

## 2022-11-04 DIAGNOSIS — E7849 Other hyperlipidemia: Secondary | ICD-10-CM | POA: Diagnosis not present

## 2022-11-04 DIAGNOSIS — K761 Chronic passive congestion of liver: Secondary | ICD-10-CM | POA: Diagnosis not present

## 2022-11-04 DIAGNOSIS — E440 Moderate protein-calorie malnutrition: Secondary | ICD-10-CM | POA: Diagnosis not present

## 2022-11-04 DIAGNOSIS — I272 Pulmonary hypertension, unspecified: Secondary | ICD-10-CM | POA: Diagnosis not present

## 2022-11-04 DIAGNOSIS — I2510 Atherosclerotic heart disease of native coronary artery without angina pectoris: Secondary | ICD-10-CM | POA: Diagnosis not present

## 2022-11-04 DIAGNOSIS — E875 Hyperkalemia: Secondary | ICD-10-CM | POA: Diagnosis not present

## 2022-11-04 DIAGNOSIS — I509 Heart failure, unspecified: Secondary | ICD-10-CM | POA: Diagnosis not present

## 2022-11-04 DIAGNOSIS — Z515 Encounter for palliative care: Secondary | ICD-10-CM | POA: Diagnosis not present

## 2022-11-04 DIAGNOSIS — I5084 End stage heart failure: Secondary | ICD-10-CM | POA: Diagnosis not present

## 2022-11-04 DIAGNOSIS — N184 Chronic kidney disease, stage 4 (severe): Secondary | ICD-10-CM | POA: Diagnosis not present

## 2022-11-04 DIAGNOSIS — F418 Other specified anxiety disorders: Secondary | ICD-10-CM | POA: Diagnosis not present

## 2022-11-04 DIAGNOSIS — I5020 Unspecified systolic (congestive) heart failure: Secondary | ICD-10-CM | POA: Diagnosis not present

## 2022-11-04 DIAGNOSIS — I472 Ventricular tachycardia, unspecified: Secondary | ICD-10-CM | POA: Diagnosis not present

## 2022-11-04 DIAGNOSIS — E1122 Type 2 diabetes mellitus with diabetic chronic kidney disease: Secondary | ICD-10-CM | POA: Diagnosis not present

## 2022-11-04 DIAGNOSIS — Z7722 Contact with and (suspected) exposure to environmental tobacco smoke (acute) (chronic): Secondary | ICD-10-CM | POA: Diagnosis not present

## 2022-11-04 DIAGNOSIS — Z66 Do not resuscitate: Secondary | ICD-10-CM | POA: Diagnosis not present

## 2022-11-04 DIAGNOSIS — I252 Old myocardial infarction: Secondary | ICD-10-CM | POA: Diagnosis not present

## 2022-11-04 DIAGNOSIS — I255 Ischemic cardiomyopathy: Secondary | ICD-10-CM | POA: Diagnosis not present

## 2022-11-04 DIAGNOSIS — Z681 Body mass index (BMI) 19 or less, adult: Secondary | ICD-10-CM | POA: Diagnosis not present

## 2022-11-04 DIAGNOSIS — R0602 Shortness of breath: Secondary | ICD-10-CM | POA: Diagnosis not present

## 2022-11-04 DIAGNOSIS — K219 Gastro-esophageal reflux disease without esophagitis: Secondary | ICD-10-CM | POA: Diagnosis not present

## 2022-11-04 DIAGNOSIS — Z7902 Long term (current) use of antithrombotics/antiplatelets: Secondary | ICD-10-CM | POA: Diagnosis not present

## 2022-11-04 DIAGNOSIS — I469 Cardiac arrest, cause unspecified: Secondary | ICD-10-CM | POA: Diagnosis not present

## 2022-11-04 DIAGNOSIS — I5023 Acute on chronic systolic (congestive) heart failure: Secondary | ICD-10-CM | POA: Diagnosis not present

## 2022-11-04 DIAGNOSIS — Z87891 Personal history of nicotine dependence: Secondary | ICD-10-CM | POA: Diagnosis not present

## 2022-11-06 ENCOUNTER — Telehealth: Payer: Self-pay | Admitting: Family Medicine

## 2022-11-06 DIAGNOSIS — I5023 Acute on chronic systolic (congestive) heart failure: Secondary | ICD-10-CM | POA: Diagnosis not present

## 2022-11-06 NOTE — Telephone Encounter (Signed)
Copied from [place] [id number]. Topic: General - Other >> Nov 06, 2022 11:59 AM Ludger Nutting wrote: Patient's son, Purcell Nails called to let pcp know that patient is currently in the hospital and that patient needs a care team set up for when she is discharged. Patient's son is requesting Baxter Flattery call his sister, Levie Heritage. [phone] Floyde Parkins

## 2022-11-13 NOTE — Telephone Encounter (Signed)
Called [facility] and there was no answer. Therefore, I called [place]/ son and talked to him concerning lab results. If [facility] nurse is drawing labs, need to talk to nephrology as these labs may be baseline for pt.

## 2022-11-13 NOTE — Telephone Encounter (Signed)
Copied from [place] [id number]. Topic: General - Other >> Nov 04, 2022  8:30 AM Oley Balm A wrote: Reason for CRM: Pt had lab work done, the [employer] Nurse came to the home  yesterday to do blood work, the lab called her daughter and told her the sodium numbers are down and kidney numbers are off and could result her needing to go to the hospital. Pt daughter want to make sure PCP received the lab results and is wanting a call back.

## 2022-11-13 DEATH — deceased

## 2023-02-01 ENCOUNTER — Ambulatory Visit: Payer: PPO | Admitting: Family Medicine
